# Patient Record
Sex: Male | Born: 1935 | Race: White | Hispanic: No | Smoking: Former smoker
Health system: Southern US, Community
[De-identification: ages and names within clinical notes are randomized; demographics above are authoritative.]

## PROBLEM LIST (undated history)

## (undated) DIAGNOSIS — I4891 Unspecified atrial fibrillation: Secondary | ICD-10-CM

## (undated) DIAGNOSIS — D500 Iron deficiency anemia secondary to blood loss (chronic): Secondary | ICD-10-CM

## (undated) DIAGNOSIS — E785 Hyperlipidemia, unspecified: Secondary | ICD-10-CM

## (undated) DIAGNOSIS — G4733 Obstructive sleep apnea (adult) (pediatric): Secondary | ICD-10-CM

## (undated) DIAGNOSIS — H269 Unspecified cataract: Secondary | ICD-10-CM

## (undated) DIAGNOSIS — K5790 Diverticulosis of intestine, part unspecified, without perforation or abscess without bleeding: Secondary | ICD-10-CM

## (undated) DIAGNOSIS — K909 Intestinal malabsorption, unspecified: Secondary | ICD-10-CM

## (undated) DIAGNOSIS — L0390 Cellulitis, unspecified: Secondary | ICD-10-CM

## (undated) DIAGNOSIS — I82409 Acute embolism and thrombosis of unspecified deep veins of unspecified lower extremity: Secondary | ICD-10-CM

## (undated) DIAGNOSIS — E1342 Other specified diabetes mellitus with diabetic polyneuropathy: Secondary | ICD-10-CM

## (undated) DIAGNOSIS — M1990 Unspecified osteoarthritis, unspecified site: Secondary | ICD-10-CM

## (undated) DIAGNOSIS — C3490 Malignant neoplasm of unspecified part of unspecified bronchus or lung: Secondary | ICD-10-CM

## (undated) DIAGNOSIS — H1010 Acute atopic conjunctivitis, unspecified eye: Secondary | ICD-10-CM

## (undated) DIAGNOSIS — I5031 Acute diastolic (congestive) heart failure: Secondary | ICD-10-CM

## (undated) DIAGNOSIS — J189 Pneumonia, unspecified organism: Secondary | ICD-10-CM

## (undated) DIAGNOSIS — J449 Chronic obstructive pulmonary disease, unspecified: Secondary | ICD-10-CM

## (undated) DIAGNOSIS — D509 Iron deficiency anemia, unspecified: Secondary | ICD-10-CM

## (undated) DIAGNOSIS — E1142 Type 2 diabetes mellitus with diabetic polyneuropathy: Secondary | ICD-10-CM

## (undated) DIAGNOSIS — I10 Essential (primary) hypertension: Secondary | ICD-10-CM

## (undated) DIAGNOSIS — H9190 Unspecified hearing loss, unspecified ear: Secondary | ICD-10-CM

## (undated) DIAGNOSIS — N400 Enlarged prostate without lower urinary tract symptoms: Secondary | ICD-10-CM

## (undated) HISTORY — DX: Essential (primary) hypertension: I10

## (undated) HISTORY — DX: Unspecified cataract: H26.9

## (undated) HISTORY — DX: Unspecified hearing loss, unspecified ear: H91.90

## (undated) HISTORY — DX: Benign prostatic hyperplasia without lower urinary tract symptoms: N40.0

## (undated) HISTORY — DX: Acute atopic conjunctivitis, unspecified eye: H10.10

## (undated) HISTORY — DX: Iron deficiency anemia, unspecified: D50.9

## (undated) HISTORY — DX: Type 2 diabetes mellitus with diabetic polyneuropathy: E11.42

## (undated) HISTORY — DX: Chronic obstructive pulmonary disease, unspecified: J44.9

## (undated) HISTORY — DX: Hyperlipidemia, unspecified: E78.5

## (undated) HISTORY — DX: Iron deficiency anemia secondary to blood loss (chronic): D50.0

## (undated) HISTORY — DX: Diverticulosis of intestine, part unspecified, without perforation or abscess without bleeding: K57.90

## (undated) HISTORY — DX: Cellulitis, unspecified: L03.90

## (undated) HISTORY — DX: Unspecified atrial fibrillation: I48.91

## (undated) HISTORY — DX: Unspecified osteoarthritis, unspecified site: M19.90

## (undated) HISTORY — DX: Intestinal malabsorption, unspecified: K90.9

## (undated) HISTORY — DX: Pneumonia, unspecified organism: J18.9

## (undated) HISTORY — PX: OTHER SURGICAL HISTORY: SHX169

---

## 1998-05-15 ENCOUNTER — Encounter: Payer: Self-pay | Admitting: Unknown Physician Specialty

## 1998-05-15 ENCOUNTER — Ambulatory Visit (HOSPITAL_COMMUNITY): Admission: RE | Admit: 1998-05-15 | Discharge: 1998-05-15 | Payer: Self-pay | Admitting: Unknown Physician Specialty

## 1999-03-20 ENCOUNTER — Ambulatory Visit: Admission: RE | Admit: 1999-03-20 | Discharge: 1999-03-20 | Payer: Self-pay | Admitting: Internal Medicine

## 2003-11-22 ENCOUNTER — Inpatient Hospital Stay (HOSPITAL_BASED_OUTPATIENT_CLINIC_OR_DEPARTMENT_OTHER): Admission: RE | Admit: 2003-11-22 | Discharge: 2003-11-22 | Payer: Self-pay | Admitting: Cardiology

## 2004-06-10 HISTORY — PX: CARDIAC CATHETERIZATION: SHX172

## 2010-12-08 ENCOUNTER — Encounter (HOSPITAL_COMMUNITY): Payer: Self-pay | Admitting: Radiology

## 2010-12-08 ENCOUNTER — Emergency Department (HOSPITAL_COMMUNITY): Payer: Medicare Other

## 2010-12-08 ENCOUNTER — Inpatient Hospital Stay (HOSPITAL_COMMUNITY)
Admission: EM | Admit: 2010-12-08 | Discharge: 2010-12-18 | DRG: 300 | Disposition: A | Discharge status: Home / Self Care | Payer: Medicare Other | Attending: Internal Medicine | Admitting: Internal Medicine

## 2010-12-08 DIAGNOSIS — C3490 Malignant neoplasm of unspecified part of unspecified bronchus or lung: Secondary | ICD-10-CM | POA: Diagnosis present

## 2010-12-08 DIAGNOSIS — IMO0002 Reserved for concepts with insufficient information to code with codable children: Secondary | ICD-10-CM | POA: Diagnosis present

## 2010-12-08 DIAGNOSIS — E785 Hyperlipidemia, unspecified: Secondary | ICD-10-CM | POA: Diagnosis present

## 2010-12-08 DIAGNOSIS — E669 Obesity, unspecified: Secondary | ICD-10-CM | POA: Diagnosis present

## 2010-12-08 DIAGNOSIS — N401 Enlarged prostate with lower urinary tract symptoms: Secondary | ICD-10-CM | POA: Diagnosis present

## 2010-12-08 DIAGNOSIS — I82629 Acute embolism and thrombosis of deep veins of unspecified upper extremity: Principal | ICD-10-CM | POA: Diagnosis present

## 2010-12-08 DIAGNOSIS — J449 Chronic obstructive pulmonary disease, unspecified: Secondary | ICD-10-CM | POA: Diagnosis present

## 2010-12-08 DIAGNOSIS — E1142 Type 2 diabetes mellitus with diabetic polyneuropathy: Secondary | ICD-10-CM | POA: Diagnosis present

## 2010-12-08 DIAGNOSIS — T85898A Other specified complication of other internal prosthetic devices, implants and grafts, initial encounter: Secondary | ICD-10-CM | POA: Diagnosis present

## 2010-12-08 DIAGNOSIS — J9819 Other pulmonary collapse: Secondary | ICD-10-CM | POA: Diagnosis not present

## 2010-12-08 DIAGNOSIS — E1149 Type 2 diabetes mellitus with other diabetic neurological complication: Secondary | ICD-10-CM | POA: Diagnosis present

## 2010-12-08 DIAGNOSIS — Z7982 Long term (current) use of aspirin: Secondary | ICD-10-CM

## 2010-12-08 DIAGNOSIS — Y849 Medical procedure, unspecified as the cause of abnormal reaction of the patient, or of later complication, without mention of misadventure at the time of the procedure: Secondary | ICD-10-CM | POA: Diagnosis present

## 2010-12-08 DIAGNOSIS — I10 Essential (primary) hypertension: Secondary | ICD-10-CM | POA: Diagnosis present

## 2010-12-08 DIAGNOSIS — J4489 Other specified chronic obstructive pulmonary disease: Secondary | ICD-10-CM | POA: Diagnosis present

## 2010-12-08 DIAGNOSIS — G4733 Obstructive sleep apnea (adult) (pediatric): Secondary | ICD-10-CM | POA: Diagnosis present

## 2010-12-08 DIAGNOSIS — R918 Other nonspecific abnormal finding of lung field: Secondary | ICD-10-CM

## 2010-12-08 DIAGNOSIS — I80299 Phlebitis and thrombophlebitis of other deep vessels of unspecified lower extremity: Secondary | ICD-10-CM

## 2010-12-08 DIAGNOSIS — M1710 Unilateral primary osteoarthritis, unspecified knee: Secondary | ICD-10-CM | POA: Diagnosis present

## 2010-12-08 DIAGNOSIS — N138 Other obstructive and reflux uropathy: Secondary | ICD-10-CM | POA: Diagnosis present

## 2010-12-08 LAB — MAGNESIUM: Magnesium: 2.1 mg/dL (ref 1.5–2.5)

## 2010-12-08 LAB — BASIC METABOLIC PANEL
BUN: 15 mg/dL (ref 6–23)
CO2: 28 mEq/L (ref 19–32)
Calcium: 9.1 mg/dL (ref 8.4–10.5)
Chloride: 99 mEq/L (ref 96–112)
Creatinine, Ser: 0.94 mg/dL (ref 0.50–1.35)
GFR calc Af Amer: >60 mL/min (ref >60)
GFR calc non Af Amer: >60 mL/min (ref >60)
Glucose, Bld: 124 mg/dL — ABNORMAL HIGH (ref 70–99)
Potassium: 4.4 mEq/L (ref 3.5–5.1)
Sodium: 137 mEq/L (ref 135–145)

## 2010-12-08 LAB — DIFFERENTIAL
Basophils Absolute: 0 10*3/uL (ref 0.0–0.1)
Basophils Relative: 0 % (ref 0–1)
Eosinophils Absolute: 0.1 10*3/uL (ref 0.0–0.7)
Eosinophils Relative: 1 % (ref 0–5)
Lymphocytes Relative: 29 % (ref 12–46)
Lymphs Abs: 3.2 10*3/uL (ref 0.7–4.0)
Monocytes Absolute: 0.8 10*3/uL (ref 0.1–1.0)
Monocytes Relative: 7 % (ref 3–12)
Neutro Abs: 6.9 10*3/uL (ref 1.7–7.7)
Neutrophils Relative %: 62 % (ref 43–77)

## 2010-12-08 LAB — CBC
HCT: 44.1 % (ref 39.0–52.0)
Hemoglobin: 15.2 g/dL (ref 13.0–17.0)
MCH: 29.7 pg (ref 26.0–34.0)
MCHC: 34.5 g/dL (ref 30.0–36.0)
MCV: 86.1 fL (ref 78.0–100.0)
Platelets: 237 10*3/uL (ref 150–400)
RBC: 5.12 MIL/uL (ref 4.22–5.81)
RDW: 13.5 % (ref 11.5–15.5)
WBC: 11 10*3/uL — ABNORMAL HIGH (ref 4.0–10.5)

## 2010-12-08 LAB — HEPATIC FUNCTION PANEL
Albumin: 2.9 g/dL — ABNORMAL LOW (ref 3.5–5.2)
Indirect Bilirubin: 0.1 mg/dL — ABNORMAL LOW (ref 0.3–0.9)
Total Bilirubin: 0.2 mg/dL — ABNORMAL LOW (ref 0.3–1.2)
Total Protein: 6.7 g/dL (ref 6.0–8.3)

## 2010-12-08 LAB — PROTIME-INR
INR: 0.96 (ref 0.00–1.49)
Prothrombin Time: 13 seconds (ref 11.6–15.2)

## 2010-12-08 LAB — HEPARIN LEVEL (UNFRACTIONATED): Heparin Unfractionated: 0.27 IU/mL — ABNORMAL LOW (ref 0.30–0.70)

## 2010-12-08 LAB — GLUCOSE, CAPILLARY: Glucose-Capillary: 197 mg/dL — ABNORMAL HIGH (ref 70–99)

## 2010-12-08 LAB — PHOSPHORUS: Phosphorus: 3.1 mg/dL (ref 2.3–4.6)

## 2010-12-08 LAB — APTT: aPTT: 35 seconds (ref 24–37)

## 2010-12-08 MED ORDER — IOHEXOL 300 MG/ML  SOLN: 75.0000 mL | Freq: Once | INTRAMUSCULAR | Status: AC | PRN

## 2010-12-09 DIAGNOSIS — R222 Localized swelling, mass and lump, trunk: Secondary | ICD-10-CM

## 2010-12-09 LAB — CBC
HCT: 41.6 % (ref 39.0–52.0)
MCH: 29.3 pg (ref 26.0–34.0)
MCV: 85.8 fL (ref 78.0–100.0)
Platelets: 210 10*3/uL (ref 150–400)
RBC: 4.85 MIL/uL (ref 4.22–5.81)
WBC: 7.7 10*3/uL (ref 4.0–10.5)

## 2010-12-09 LAB — DIFFERENTIAL
Eosinophils Absolute: 0.2 10*3/uL (ref 0.0–0.7)
Lymphocytes Relative: 34 % (ref 12–46)
Lymphs Abs: 2.6 10*3/uL (ref 0.7–4.0)
Monocytes Relative: 12 % (ref 3–12)
Neutrophils Relative %: 51 % (ref 43–77)

## 2010-12-09 LAB — URINALYSIS, ROUTINE W REFLEX MICROSCOPIC
Bilirubin Urine: NEGATIVE
Glucose, UA: NEGATIVE mg/dL
Hgb urine dipstick: NEGATIVE
Ketones, ur: NEGATIVE mg/dL
Leukocytes, UA: NEGATIVE
Nitrite: NEGATIVE
Protein, ur: NEGATIVE mg/dL
Specific Gravity, Urine: 1.008 (ref 1.005–1.030)
Urobilinogen, UA: 0.2 mg/dL (ref 0.0–1.0)
pH: 6.5 (ref 5.0–8.0)

## 2010-12-09 LAB — COMPREHENSIVE METABOLIC PANEL
ALT: 14 U/L (ref 0–53)
AST: 18 U/L (ref 0–37)
Alkaline Phosphatase: 91 U/L (ref 39–117)
CO2: 26 mEq/L (ref 19–32)
Calcium: 8.6 mg/dL (ref 8.4–10.5)
Chloride: 99 mEq/L (ref 96–112)
GFR calc Af Amer: >60 mL/min (ref >60)
GFR calc non Af Amer: >60 mL/min (ref >60)
Glucose, Bld: 126 mg/dL — ABNORMAL HIGH (ref 70–99)
Potassium: 4.1 mEq/L (ref 3.5–5.1)
Sodium: 134 mEq/L — ABNORMAL LOW (ref 135–145)
Total Bilirubin: 0.3 mg/dL (ref 0.3–1.2)

## 2010-12-09 LAB — SURGICAL PCR SCREEN
MRSA, PCR: NEGATIVE
Staphylococcus aureus: POSITIVE — AB

## 2010-12-09 LAB — HOMOCYSTEINE: Homocysteine: 8.6 umol/L (ref 4.0–15.4)

## 2010-12-09 LAB — GLUCOSE, CAPILLARY: Glucose-Capillary: 127 mg/dL — ABNORMAL HIGH (ref 70–99)

## 2010-12-09 LAB — ABO/RH: ABO/RH(D): A POS

## 2010-12-09 LAB — HEPARIN LEVEL (UNFRACTIONATED): Heparin Unfractionated: 0.55 IU/mL (ref 0.30–0.70)

## 2010-12-10 LAB — PROTEIN C ACTIVITY: Protein C Activity: 139 % — ABNORMAL HIGH (ref 75–133)

## 2010-12-10 LAB — COMPREHENSIVE METABOLIC PANEL
Albumin: 3 g/dL — ABNORMAL LOW (ref 3.5–5.2)
BUN: 12 mg/dL (ref 6–23)
Calcium: 8.7 mg/dL (ref 8.4–10.5)
Creatinine, Ser: 0.74 mg/dL (ref 0.50–1.35)
GFR calc Af Amer: >60 mL/min (ref >60)
Glucose, Bld: 146 mg/dL — ABNORMAL HIGH (ref 70–99)
Potassium: 4.9 mEq/L (ref 3.5–5.1)
Total Protein: 6.5 g/dL (ref 6.0–8.3)

## 2010-12-10 LAB — PROTIME-INR: Prothrombin Time: 12.9 seconds (ref 11.6–15.2)

## 2010-12-10 LAB — LUPUS ANTICOAGULANT PANEL: Lupus Anticoagulant: NOT DETECTED

## 2010-12-10 LAB — CBC
HCT: 43.2 % (ref 39.0–52.0)
MCH: 29.4 pg (ref 26.0–34.0)
MCHC: 34.3 g/dL (ref 30.0–36.0)
RDW: 13.6 % (ref 11.5–15.5)

## 2010-12-10 LAB — DIFFERENTIAL
Basophils Absolute: 0 10*3/uL (ref 0.0–0.1)
Eosinophils Relative: 4 % (ref 0–5)
Lymphocytes Relative: 35 % (ref 12–46)
Monocytes Absolute: 0.6 10*3/uL (ref 0.1–1.0)
Monocytes Relative: 9 % (ref 3–12)

## 2010-12-10 LAB — GLUCOSE, CAPILLARY
Glucose-Capillary: 162 mg/dL — ABNORMAL HIGH (ref 70–99)
Glucose-Capillary: 140 mg/dL — ABNORMAL HIGH (ref 70–99)

## 2010-12-10 LAB — PROTEIN S, TOTAL: Protein S Ag, Total: 94 % (ref 60–150)

## 2010-12-10 LAB — PROTEIN C, TOTAL: Protein C, Total: 90 % (ref 72–160)

## 2010-12-10 LAB — PROTEIN S ACTIVITY: Protein S Activity: 113 % (ref 69–129)

## 2010-12-11 ENCOUNTER — Other Ambulatory Visit: Payer: Self-pay | Admitting: Thoracic Surgery

## 2010-12-11 ENCOUNTER — Inpatient Hospital Stay (HOSPITAL_COMMUNITY): Payer: Medicare Other

## 2010-12-11 DIAGNOSIS — R222 Localized swelling, mass and lump, trunk: Secondary | ICD-10-CM

## 2010-12-11 HISTORY — PX: FIBEROPTIC BRONCHOSCOPY: SHX5367

## 2010-12-11 LAB — GLUCOSE, CAPILLARY
Glucose-Capillary: 268 mg/dL — ABNORMAL HIGH (ref 70–99)
Glucose-Capillary: 187 mg/dL — ABNORMAL HIGH (ref 70–99)

## 2010-12-11 LAB — CROSSMATCH
ABO/RH(D): A POS
Antibody Screen: NEGATIVE
Unit division: 0
Unit division: 0

## 2010-12-11 LAB — CBC
HCT: 41.3 % (ref 39.0–52.0)
Hemoglobin: 13.8 g/dL (ref 13.0–17.0)
MCHC: 33.4 g/dL (ref 30.0–36.0)
MCV: 86 fL (ref 78.0–100.0)
RDW: 13.5 % (ref 11.5–15.5)
WBC: 7.8 10*3/uL (ref 4.0–10.5)

## 2010-12-11 LAB — PROTIME-INR: INR: 0.93 (ref 0.00–1.49)

## 2010-12-11 LAB — COMPREHENSIVE METABOLIC PANEL
ALT: 20 U/L (ref 0–53)
AST: 27 U/L (ref 0–37)
CO2: 27 mEq/L (ref 19–32)
Chloride: 100 mEq/L (ref 96–112)
GFR calc Af Amer: >60 mL/min (ref >60)
GFR calc non Af Amer: >60 mL/min (ref >60)
Glucose, Bld: 143 mg/dL — ABNORMAL HIGH (ref 70–99)
Sodium: 135 mEq/L (ref 135–145)
Total Bilirubin: 0.3 mg/dL (ref 0.3–1.2)

## 2010-12-11 LAB — DIFFERENTIAL
Basophils Absolute: 0 10*3/uL (ref 0.0–0.1)
Eosinophils Relative: 2 % (ref 0–5)
Lymphocytes Relative: 34 % (ref 12–46)
Lymphs Abs: 2.6 10*3/uL (ref 0.7–4.0)
Monocytes Absolute: 0.7 10*3/uL (ref 0.1–1.0)
Neutro Abs: 4.2 10*3/uL (ref 1.7–7.7)

## 2010-12-11 MED ORDER — GADOBENATE DIMEGLUMINE 529 MG/ML IV SOLN: 20.0000 mL | Freq: Once | INTRAVENOUS | Status: DC

## 2010-12-12 ENCOUNTER — Inpatient Hospital Stay (HOSPITAL_COMMUNITY): Payer: Medicare Other

## 2010-12-12 DIAGNOSIS — C3490 Malignant neoplasm of unspecified part of unspecified bronchus or lung: Secondary | ICD-10-CM

## 2010-12-12 LAB — COMPREHENSIVE METABOLIC PANEL
AST: 41 U/L — ABNORMAL HIGH (ref 0–37)
BUN: 10 mg/dL (ref 6–23)
CO2: 25 mEq/L (ref 19–32)
Calcium: 9.2 mg/dL (ref 8.4–10.5)
Chloride: 98 mEq/L (ref 96–112)
Creatinine, Ser: 0.9 mg/dL (ref 0.50–1.35)
GFR calc Af Amer: >60 mL/min (ref >60)
GFR calc non Af Amer: >60 mL/min (ref >60)
Glucose, Bld: 180 mg/dL — ABNORMAL HIGH (ref 70–99)
Total Bilirubin: 0.4 mg/dL (ref 0.3–1.2)

## 2010-12-12 LAB — DIFFERENTIAL
Eosinophils Absolute: 0.1 10*3/uL (ref 0.0–0.7)
Eosinophils Relative: 1 % (ref 0–5)
Lymphocytes Relative: 24 % (ref 12–46)
Lymphs Abs: 2.4 10*3/uL (ref 0.7–4.0)
Monocytes Absolute: 0.7 10*3/uL (ref 0.1–1.0)

## 2010-12-12 LAB — GLUCOSE, CAPILLARY
Glucose-Capillary: 251 mg/dL — ABNORMAL HIGH (ref 70–99)
Glucose-Capillary: 229 mg/dL — ABNORMAL HIGH (ref 70–99)

## 2010-12-12 LAB — CBC
HCT: 42.8 % (ref 39.0–52.0)
MCH: 29 pg (ref 26.0–34.0)
MCHC: 33.9 g/dL (ref 30.0–36.0)
MCV: 85.6 fL (ref 78.0–100.0)
Platelets: 216 10*3/uL (ref 150–400)
RDW: 13.7 % (ref 11.5–15.5)

## 2010-12-12 LAB — CARDIAC PANEL(CRET KIN+CKTOT+MB+TROPI)
Relative Index: INVALID (ref 0.0–2.5)
Total CK: 66 U/L (ref 7–232)

## 2010-12-12 NOTE — H&P (Signed)
[name], [name] NO.:  [id number]  MEDICAL RECORD NO.:  [id number]  LOCATION:  [facility]                         FACILITY:  [facility]  PHYSICIAN:  Ojas Coone A. Linsi Humann, M.D.   DATE OF BIRTH:  01-12-1925  DATE OF ADMISSION:  12/08/2010 DATE OF DISCHARGE:                             HISTORY & PHYSICAL   CHIEF COMPLAINT:  Right arm swelling.  HISTORY OF PRESENT ILLNESS:  [name] is a pleasant 77 year old gentleman, who was in his usual state of health and in fact felt fine yesterday. He woke up this morning and noticed that he had extensive right upper extremity swelling.  We were called and he was advised to come to the emergency room where he is found to have extensive deep vein thrombosis to the level of the internal jugular on the right.  He denies any recent fevers.  He denies any recent travel or injury to his arm.  He has had no chest pains or shortness of breath that are new or different for him. He does have an occasional cough and has a history of COPD.  He has had no significant changes in his weight or dietary habits.  Given these findings, he will be admitted for further evaluation and treatment.  PAST MEDICAL HISTORY:  Allergic conjunctivitis, diabetic peripheral neuropathy, cataracts, benign prostatic hypertrophy with obstruction, degenerative joint disease of the left knee, chronic obstructive pulmonary disease, mild obesity, hyperlipidemia, type 2 diabetes, obstructive sleep apnea using a CPAP machine nightly, allergic rhinitis, hypertension, hyperlipidemia, hemorrhoids and diverticulosis by colonoscopy in 2006, and a right-sided pneumonia in September 2011.  He has had a cardiac cath in 2006 and a deviated septum repair in the past.  SOCIAL HISTORY:  He is married with two children.  He lives at home with his wife.  He works as a Visual merchandiser.  He had a prior smoking habit, but quit in 1994.  He still has an occasional cigarette.  He denies any alcohol or drug  use.  FAMILY HISTORY:  Father died at age 28 of MS.  Mother died at age 5. Family history is significant for cancer, multiple sclerosis, coronary disease, and diabetes.  REVIEW OF SYSTEMS:  As per the history of present illness.  PHYSICAL EXAM:  VITAL SIGNS:  Temperature 97.4, blood pressure 113/67, pulse 82, respiratory rate 18, 94% oxygen saturation on room air. GENERAL:  He is semi supine in no acute distress.  He has extensive edema of the right forearm and upper arm and even the right supraclavicular area.  He does have normal strength.  He has no redness or cellulitis changes noted. NEUROLOGIC:  He is neurologically grossly intact and moves extremities x4. NECK:  There is no appreciable JVD. LUNGS:  Clear to auscultation bilaterally with no wheezes, rales, or rhonchi. HEART:  Regular rate and rhythm with no significant murmur, rub, or gallop. ABDOMEN:  Soft, nontender, and nondistended.  There is no peripheral edema and grossly normal distal pulses.  LABORATORY DATA:  Several labs are pending, however, a CBC he is up that shows white count of 11,000 with 62% segs, 29% lymphocytes, 7% monocytes, hemoglobin is 15.2, platelet count 237,000, INR 0.96,  PTT is normal at 35.  Other labs are pending to include a BMET and LFT, mag phos, coagulopathy panel, and TSH.  ASSESSMENT/PLAN:  An 77 year old gentleman with new right upper extremity deep venous thrombosis, which appears to be spontaneous.  I am rather concerned about underlying malignancy.  We will place him on a full-dose heparin drip with bolus.  We will continue his aspirin and Zocor and Spiriva and albuterol.  We will put his diabetic medications on hold and cover him with Lantus and NovoLog insulin while in the hospital.  We will obtain a CT scan of the neck, chest, abdomen, and pelvis to look for underlying malignancy.  We will also do a coagulopathy panel.  He is a full code status.     [name] A. Waynard Edwards,  M.D.     MAP/MEDQ  D:  12/08/2010  T:  12/08/2010  Job:  [id number]  Electronically Signed by Rodrigo Ran M.D. on 12/12/2010 08:39:28 AM

## 2010-12-13 ENCOUNTER — Encounter (HOSPITAL_COMMUNITY): Payer: Self-pay | Admitting: Radiology

## 2010-12-13 ENCOUNTER — Ambulatory Visit (HOSPITAL_COMMUNITY): Payer: Medicare Other

## 2010-12-13 DIAGNOSIS — C3490 Malignant neoplasm of unspecified part of unspecified bronchus or lung: Secondary | ICD-10-CM | POA: Insufficient documentation

## 2010-12-13 DIAGNOSIS — J9819 Other pulmonary collapse: Secondary | ICD-10-CM | POA: Insufficient documentation

## 2010-12-13 LAB — CBC
MCH: 28.9 pg (ref 26.0–34.0)
MCHC: 33.7 g/dL (ref 30.0–36.0)
Platelets: 206 10*3/uL (ref 150–400)
RBC: 4.95 MIL/uL (ref 4.22–5.81)

## 2010-12-13 LAB — BETA-2-GLYCOPROTEIN I ABS, IGG/M/A
Beta-2 Glyco I IgG: 0 G Units
Beta-2-Glycoprotein I IgA: 3 A Units
Beta-2-Glycoprotein I IgM: 1 M Units

## 2010-12-13 LAB — PROTIME-INR
INR: 0.95 (ref 0.00–1.49)
Prothrombin Time: 12.9 seconds (ref 11.6–15.2)

## 2010-12-13 LAB — CARDIAC PANEL(CRET KIN+CKTOT+MB+TROPI)
CK, MB: 2.7 ng/mL (ref 0.3–4.0)
Relative Index: INVALID (ref 0.0–2.5)
Relative Index: INVALID (ref 0.0–2.5)
Total CK: 65 U/L (ref 7–232)
Total CK: 68 U/L (ref 7–232)
Troponin I: <0.3 ng/mL

## 2010-12-13 LAB — CARDIOLIPIN ANTIBODIES, IGG, IGM, IGA
Anticardiolipin IgA: 10 U/mL — ABNORMAL LOW
Anticardiolipin IgG: 18 GPL U/mL
Anticardiolipin IgM: 2 [MPL'U]/mL — ABNORMAL LOW

## 2010-12-13 LAB — BASIC METABOLIC PANEL
CO2: 28 mEq/L (ref 19–32)
Calcium: 8.9 mg/dL (ref 8.4–10.5)
Creatinine, Ser: 0.85 mg/dL (ref 0.50–1.35)
Glucose, Bld: 144 mg/dL — ABNORMAL HIGH (ref 70–99)

## 2010-12-13 LAB — GLUCOSE, CAPILLARY
Glucose-Capillary: 335 mg/dL — ABNORMAL HIGH (ref 70–99)
Glucose-Capillary: 102 mg/dL — ABNORMAL HIGH (ref 70–99)

## 2010-12-13 LAB — FACTOR 5 LEIDEN

## 2010-12-13 MED ORDER — FLUDEOXYGLUCOSE F - 18 (FDG) INJECTION
15.9000 | Freq: Once | INTRAVENOUS | Status: AC | PRN
  Administered 2010-12-13: 15.9 via INTRAVENOUS
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
## 2010-12-14 DIAGNOSIS — C3490 Malignant neoplasm of unspecified part of unspecified bronchus or lung: Secondary | ICD-10-CM

## 2010-12-14 LAB — GLUCOSE, CAPILLARY: Glucose-Capillary: 138 mg/dL — ABNORMAL HIGH (ref 70–99)

## 2010-12-14 LAB — CBC
HCT: 41.4 % (ref 39.0–52.0)
Hemoglobin: 14.1 g/dL (ref 13.0–17.0)
MCV: 86.3 fL (ref 78.0–100.0)
Platelets: 229 10*3/uL (ref 150–400)
RBC: 4.8 MIL/uL (ref 4.22–5.81)
WBC: 8.1 10*3/uL (ref 4.0–10.5)

## 2010-12-14 LAB — COMPREHENSIVE METABOLIC PANEL
ALT: 33 U/L (ref 0–53)
Alkaline Phosphatase: 98 U/L (ref 39–117)
Chloride: 99 mEq/L (ref 96–112)
GFR calc Af Amer: >60 mL/min (ref >60)
Glucose, Bld: 147 mg/dL — ABNORMAL HIGH (ref 70–99)
Potassium: 5 mEq/L (ref 3.5–5.1)
Sodium: 136 mEq/L (ref 135–145)
Total Protein: 7.1 g/dL (ref 6.0–8.3)

## 2010-12-14 LAB — DIFFERENTIAL
Eosinophils Absolute: 0.3 10*3/uL (ref 0.0–0.7)
Lymphocytes Relative: 26 % (ref 12–46)
Lymphs Abs: 2.1 10*3/uL (ref 0.7–4.0)
Neutro Abs: 4.7 10*3/uL (ref 1.7–7.7)
Neutrophils Relative %: 58 % (ref 43–77)

## 2010-12-14 LAB — PROTHROMBIN GENE MUTATION

## 2010-12-15 DIAGNOSIS — C3490 Malignant neoplasm of unspecified part of unspecified bronchus or lung: Secondary | ICD-10-CM

## 2010-12-15 LAB — COMPREHENSIVE METABOLIC PANEL
AST: 18 U/L (ref 0–37)
Albumin: 2.8 g/dL — ABNORMAL LOW (ref 3.5–5.2)
Alkaline Phosphatase: 95 U/L (ref 39–117)
Chloride: 100 mEq/L (ref 96–112)
Potassium: 4.1 mEq/L (ref 3.5–5.1)
Sodium: 137 mEq/L (ref 135–145)
Total Bilirubin: 0.4 mg/dL (ref 0.3–1.2)

## 2010-12-15 LAB — CULTURE, RESPIRATORY W GRAM STAIN

## 2010-12-15 LAB — DIFFERENTIAL
Basophils Absolute: 0 10*3/uL (ref 0.0–0.1)
Basophils Relative: 0 % (ref 0–1)
Monocytes Relative: 13 % — ABNORMAL HIGH (ref 3–12)
Neutro Abs: 4.2 10*3/uL (ref 1.7–7.7)
Neutrophils Relative %: 55 % (ref 43–77)

## 2010-12-15 LAB — GLUCOSE, CAPILLARY: Glucose-Capillary: 173 mg/dL — ABNORMAL HIGH (ref 70–99)

## 2010-12-15 LAB — CBC
Hemoglobin: 13.9 g/dL (ref 13.0–17.0)
RBC: 4.75 MIL/uL (ref 4.22–5.81)

## 2010-12-16 LAB — DIFFERENTIAL
Basophils Absolute: 0 10*3/uL (ref 0.0–0.1)
Lymphocytes Relative: 31 % (ref 12–46)
Monocytes Absolute: 0.9 10*3/uL (ref 0.1–1.0)
Monocytes Relative: 13 % — ABNORMAL HIGH (ref 3–12)
Neutro Abs: 3.7 10*3/uL (ref 1.7–7.7)

## 2010-12-16 LAB — COMPREHENSIVE METABOLIC PANEL
BUN: 17 mg/dL (ref 6–23)
Calcium: 9 mg/dL (ref 8.4–10.5)
Creatinine, Ser: 0.85 mg/dL (ref 0.50–1.35)
GFR calc Af Amer: >60 mL/min (ref >60)
GFR calc non Af Amer: >60 mL/min (ref >60)
Glucose, Bld: 196 mg/dL — ABNORMAL HIGH (ref 70–99)
Sodium: 133 mEq/L — ABNORMAL LOW (ref 135–145)
Total Protein: 7 g/dL (ref 6.0–8.3)

## 2010-12-16 LAB — CBC
HCT: 40.7 % (ref 39.0–52.0)
Hemoglobin: 14.4 g/dL (ref 13.0–17.0)
MCH: 30.1 pg (ref 26.0–34.0)
MCHC: 35.4 g/dL (ref 30.0–36.0)
MCV: 85 fL (ref 78.0–100.0)

## 2010-12-16 LAB — GLUCOSE, CAPILLARY
Glucose-Capillary: 170 mg/dL — ABNORMAL HIGH (ref 70–99)
Glucose-Capillary: 202 mg/dL — ABNORMAL HIGH (ref 70–99)
Glucose-Capillary: 144 mg/dL — ABNORMAL HIGH (ref 70–99)
Glucose-Capillary: 179 mg/dL — ABNORMAL HIGH (ref 70–99)

## 2010-12-17 ENCOUNTER — Inpatient Hospital Stay (HOSPITAL_COMMUNITY): Payer: Medicare Other

## 2010-12-17 DIAGNOSIS — C3490 Malignant neoplasm of unspecified part of unspecified bronchus or lung: Secondary | ICD-10-CM

## 2010-12-17 HISTORY — PX: OTHER SURGICAL HISTORY: SHX169

## 2010-12-17 LAB — DIFFERENTIAL
Basophils Absolute: 0 10*3/uL (ref 0.0–0.1)
Eosinophils Absolute: 0.3 10*3/uL (ref 0.0–0.7)
Eosinophils Relative: 3 % (ref 0–5)
Monocytes Absolute: 0.8 10*3/uL (ref 0.1–1.0)

## 2010-12-17 LAB — COMPREHENSIVE METABOLIC PANEL
AST: 16 U/L (ref 0–37)
Albumin: 3.3 g/dL — ABNORMAL LOW (ref 3.5–5.2)
Alkaline Phosphatase: 107 U/L (ref 39–117)
Chloride: 99 mEq/L (ref 96–112)
Creatinine, Ser: 0.87 mg/dL (ref 0.50–1.35)
Potassium: 4 mEq/L (ref 3.5–5.1)
Total Bilirubin: 0.4 mg/dL (ref 0.3–1.2)
Total Protein: 7.5 g/dL (ref 6.0–8.3)

## 2010-12-17 LAB — CBC
MCHC: 34.1 g/dL (ref 30.0–36.0)
Platelets: 251 10*3/uL (ref 150–400)
RDW: 13.6 % (ref 11.5–15.5)
WBC: 8.5 10*3/uL (ref 4.0–10.5)

## 2010-12-17 LAB — GLUCOSE, CAPILLARY
Glucose-Capillary: 179 mg/dL — ABNORMAL HIGH (ref 70–99)
Glucose-Capillary: 170 mg/dL — ABNORMAL HIGH (ref 70–99)
Glucose-Capillary: 173 mg/dL — ABNORMAL HIGH (ref 70–99)
Glucose-Capillary: 167 mg/dL — ABNORMAL HIGH (ref 70–99)

## 2010-12-17 NOTE — Op Note (Signed)
  NAMEEMET, RAFANAN NO.:  [id number]  MEDICAL RECORD NO.:  [id number]  LOCATION:  2036                         FACILITY:  [facility]  PHYSICIAN:  Ines Bloomer, M.D. DATE OF BIRTH:  1925-02-01  DATE OF PROCEDURE: DATE OF DISCHARGE:                              OPERATIVE REPORT   PREOPERATIVE DIAGNOSIS:  Stage IIIA non-small cell lung cancer.  POSTOPERATIVE DIAGNOSIS:  Stage IIIA non-small cell lung cancer.  OPERATION PERFORMED:  Insertion of a left subclavian Port-A-Cath.  SURGEON:  Ines Bloomer, MD  ANESTHESIA:  IV sedation and 1% Xylocaine.  After prepping and draping the left chest, area was infiltrated with 1% Xylocaine that entered clavicular area and a left subclavian puncture was performed.  The guidewire threaded under fluoro to the right atrium. Stab wound was made around the guidewire.  Another area was infiltrated with 1% Xylocaine,  inferior to this a transverse incision was made and a pocket was dissected out for the Port-A-Cath.  A 9.63 attached Bard Port-A-Cath was inserted in the pocket and sutured in place with 2-0 silk.  It was then the tubing was tunneled from the pocket to the stab wound around the guidewire.  The tube was then measured to be in the distal SVC and cut appropriately.  Over the guidewire was passed the dilator with peel-away sheath.  The dilator and the guidewire were removed and the tubing passed through the peel-away sheath and the peel- away sheath was removed.  This confirmed to be in the distal SVC by fluoro.  It flushed easily and withdrew easily.  Wounds were closed with 3-0 Vicryl in the subcutaneous tissue and Dermabond to the skin.  The Port-A-Cath was then cannulated with a Huber needle and connected to an IV.  The patient is turned to the recovery room in stable condition.     Ines Bloomer, M.D.     DPB/MEDQ  D:  12/17/2010  T:  12/17/2010  Job:  [id number]  cc:   Josph Macho,  M.D.  Electronically Signed by Jovita Gamma M.D. on 12/17/2010 01:56:03 PM

## 2010-12-17 NOTE — Op Note (Signed)
  [name], [name] NO.:  [id number]  MEDICAL RECORD NO.:  [id number]  LOCATION:  2036                         FACILITY:  [facility]  PHYSICIAN:  Ines Bloomer, M.D. DATE OF BIRTH:  [birth year]  DATE OF PROCEDURE: DATE OF DISCHARGE:                              OPERATIVE REPORT   PREOPERATIVE DIAGNOSIS:  Mediastinal adenopathy.  POSTOPERATIVE DIAGNOSIS:  Poorly-differentiated non-small colon cancer.  OPERATION PERFORMED:  Fiberoptic bronchoscopy with endobronchial ultrasound.  This patient came in with clot in his right subclavian vein that was new and probably his right IJ internal jugular with some swelling on the right arm, was started on heparin, was found to have a mediastinal mass into the right 4Rnode.  He was brought to the operating room for fiberoptic bronchoscopy with endobronchial ultrasound after general anesthesia.  Video bronchoscope was passed through the endotracheal tube.  The carina was is the midline.  The left mainstem, left upper lobe and left lower lobe orifices were normal.  Right mainstem, right upper lobe and right lower lobe orifices were normal.  No endobronchial lesions could be seen.  Washings were sent for cytologies.  We then inserted the endobronchial ultrasound, saw several large 4R nodes.  We then had aspirations of these by passing the needle through the working channel so we can see the sheath and then under ultrasound guidance biopsying 2 sets of 4R nodes with 3 aspirations in 1 and 2 aspirations in the other.  They were sent for cytology and cytology showed a probable poor differentiated non-small cell lung cancer.  The video bronchoscope was viewed.  All scope was removed.  The patient turned to the recovery room in stable condition.     Ines Bloomer, M.D.     DPB/MEDQ  D:  12/11/2010  T:  12/12/2010  Job:  [id number]  Electronically Signed by Jovita Gamma M.D. on 12/17/2010 01:56:01 PM

## 2010-12-18 ENCOUNTER — Inpatient Hospital Stay (HOSPITAL_COMMUNITY): Payer: Medicare Other

## 2010-12-18 LAB — DIFFERENTIAL
Eosinophils Absolute: 0.4 10*3/uL (ref 0.0–0.7)
Eosinophils Relative: 6 % — ABNORMAL HIGH (ref 0–5)
Lymphocytes Relative: 29 % (ref 12–46)
Lymphs Abs: 2 10*3/uL (ref 0.7–4.0)
Monocytes Absolute: 0.9 10*3/uL (ref 0.1–1.0)
Monocytes Relative: 13 % — ABNORMAL HIGH (ref 3–12)

## 2010-12-18 LAB — GLUCOSE, CAPILLARY
Glucose-Capillary: 179 mg/dL — ABNORMAL HIGH (ref 70–99)
Glucose-Capillary: 224 mg/dL — ABNORMAL HIGH (ref 70–99)

## 2010-12-18 LAB — CBC
HCT: 40.5 % (ref 39.0–52.0)
MCH: 29.4 pg (ref 26.0–34.0)
MCHC: 33.8 g/dL (ref 30.0–36.0)
MCV: 86.9 fL (ref 78.0–100.0)
Platelets: 247 10*3/uL (ref 150–400)
RDW: 13.6 % (ref 11.5–15.5)
WBC: 7 10*3/uL (ref 4.0–10.5)

## 2010-12-18 LAB — COMPREHENSIVE METABOLIC PANEL
AST: 15 U/L (ref 0–37)
Albumin: 2.8 g/dL — ABNORMAL LOW (ref 3.5–5.2)
BUN: 19 mg/dL (ref 6–23)
Calcium: 8.7 mg/dL (ref 8.4–10.5)
Chloride: 97 mEq/L (ref 96–112)
Creatinine, Ser: 0.88 mg/dL (ref 0.50–1.35)
Total Bilirubin: 0.3 mg/dL (ref 0.3–1.2)
Total Protein: 6.6 g/dL (ref 6.0–8.3)

## 2010-12-19 ENCOUNTER — Emergency Department (HOSPITAL_COMMUNITY)
Admission: EM | Admit: 2010-12-19 | Discharge: 2010-12-19 | Disposition: A | Discharge status: Home / Self Care | Payer: Medicare Other | Attending: Emergency Medicine | Admitting: Emergency Medicine

## 2010-12-19 ENCOUNTER — Ambulatory Visit
Admit: 2010-12-19 | Discharge: 2010-12-19 | Disposition: A | Discharge status: Home / Self Care | Payer: Medicare Other | Attending: Radiation Oncology | Admitting: Radiation Oncology

## 2010-12-19 ENCOUNTER — Emergency Department (HOSPITAL_COMMUNITY): Payer: Medicare Other

## 2010-12-19 DIAGNOSIS — J449 Chronic obstructive pulmonary disease, unspecified: Secondary | ICD-10-CM | POA: Insufficient documentation

## 2010-12-19 DIAGNOSIS — C771 Secondary and unspecified malignant neoplasm of intrathoracic lymph nodes: Secondary | ICD-10-CM | POA: Insufficient documentation

## 2010-12-19 DIAGNOSIS — T82898A Other specified complication of vascular prosthetic devices, implants and grafts, initial encounter: Secondary | ICD-10-CM | POA: Insufficient documentation

## 2010-12-19 DIAGNOSIS — E1149 Type 2 diabetes mellitus with other diabetic neurological complication: Secondary | ICD-10-CM | POA: Insufficient documentation

## 2010-12-19 DIAGNOSIS — I10 Essential (primary) hypertension: Secondary | ICD-10-CM | POA: Insufficient documentation

## 2010-12-19 DIAGNOSIS — I4891 Unspecified atrial fibrillation: Secondary | ICD-10-CM | POA: Insufficient documentation

## 2010-12-19 DIAGNOSIS — Y842 Radiological procedure and radiotherapy as the cause of abnormal reaction of the patient, or of later complication, without mention of misadventure at the time of the procedure: Secondary | ICD-10-CM | POA: Insufficient documentation

## 2010-12-19 DIAGNOSIS — Z87891 Personal history of nicotine dependence: Secondary | ICD-10-CM | POA: Insufficient documentation

## 2010-12-19 DIAGNOSIS — C3490 Malignant neoplasm of unspecified part of unspecified bronchus or lung: Secondary | ICD-10-CM | POA: Insufficient documentation

## 2010-12-19 DIAGNOSIS — J4489 Other specified chronic obstructive pulmonary disease: Secondary | ICD-10-CM | POA: Insufficient documentation

## 2010-12-19 DIAGNOSIS — G4730 Sleep apnea, unspecified: Secondary | ICD-10-CM | POA: Insufficient documentation

## 2010-12-19 DIAGNOSIS — Z7982 Long term (current) use of aspirin: Secondary | ICD-10-CM | POA: Insufficient documentation

## 2010-12-19 DIAGNOSIS — Z86718 Personal history of other venous thrombosis and embolism: Secondary | ICD-10-CM | POA: Insufficient documentation

## 2010-12-19 DIAGNOSIS — Y849 Medical procedure, unspecified as the cause of abnormal reaction of the patient, or of later complication, without mention of misadventure at the time of the procedure: Secondary | ICD-10-CM | POA: Insufficient documentation

## 2010-12-19 DIAGNOSIS — Z79899 Other long term (current) drug therapy: Secondary | ICD-10-CM | POA: Insufficient documentation

## 2010-12-19 DIAGNOSIS — J40 Bronchitis, not specified as acute or chronic: Secondary | ICD-10-CM | POA: Insufficient documentation

## 2010-12-19 DIAGNOSIS — E785 Hyperlipidemia, unspecified: Secondary | ICD-10-CM | POA: Insufficient documentation

## 2010-12-19 DIAGNOSIS — E1142 Type 2 diabetes mellitus with diabetic polyneuropathy: Secondary | ICD-10-CM | POA: Insufficient documentation

## 2010-12-19 DIAGNOSIS — E119 Type 2 diabetes mellitus without complications: Secondary | ICD-10-CM | POA: Insufficient documentation

## 2010-12-19 DIAGNOSIS — R05 Cough: Secondary | ICD-10-CM | POA: Insufficient documentation

## 2010-12-19 DIAGNOSIS — R21 Rash and other nonspecific skin eruption: Secondary | ICD-10-CM | POA: Insufficient documentation

## 2010-12-19 DIAGNOSIS — R059 Cough, unspecified: Secondary | ICD-10-CM | POA: Insufficient documentation

## 2010-12-19 DIAGNOSIS — IMO0002 Reserved for concepts with insufficient information to code with codable children: Secondary | ICD-10-CM | POA: Insufficient documentation

## 2010-12-19 DIAGNOSIS — R1310 Dysphagia, unspecified: Secondary | ICD-10-CM | POA: Insufficient documentation

## 2010-12-19 DIAGNOSIS — R110 Nausea: Secondary | ICD-10-CM | POA: Insufficient documentation

## 2010-12-19 DIAGNOSIS — Z510 Encounter for antineoplastic radiation therapy: Secondary | ICD-10-CM | POA: Insufficient documentation

## 2010-12-20 NOTE — Consult Note (Signed)
[name], [name] NO.:  [id number]  MEDICAL RECORD NO.:  [id number]  LOCATION:  2036                         FACILITY:  [facility]  PHYSICIAN:  Kerin Perna, M.D.  DATE OF BIRTH:  06-11-1924  DATE OF CONSULTATION: DATE OF DISCHARGE:                                CONSULTATION   PHYSICIAN REQUESTING CONSULTATION:  Mark A. Perini, M.D.  PRIMARY CARE PHYSICIAN:  Dr. Vassie Loll.  REASON FOR CONSULTATION:  Right paratracheal mass with DVT of the right upper extremity.  CHIEF COMPLAINT:  Right arm swelling.  HISTORY OF PRESENT ILLNESS:  I was asked to evaluate this 77 year old Caucasian male smoker for further evaluation treatment of recently diagnosed 3-cm right paratracheal mass.  The patient presented to the hospital yesterday with pain and swelling in his right upper extremity and was found to have extensive DVT of the right subclavian vein and right internal jugular vein to the brachial cephalic vein on the right. He denies any trauma to the neck or right upper extremity.  He has a chronic smoker.  He has lost 20 pounds over the last 6 months.  He denies productive cough, hemoptysis, or fever.  A CT scan of the chest and abdomen was performed to rule out occult malignancy, which showed a 3-cm right paratracheal mass.  There was some thrombus noted in the central venous system.  There is no primary lung mass.  His abdominal CT showed no evidence of abdominal metastatic disease.  He did have a gallstone and some atherosclerotic changes of his iliac artery.  PAST MEDICAL HISTORY: 1. Diabetes mellitus. 2. Hypertension. 3. COPD with obstructive sleep apnea. 4. Degenerative arthritis. 5. Dyslipidemia. 6. Diverticulosis. 7. BPH. 8. Obesity. 9. Peripheral neuropathy.  ALLERGIES:  NO KNOWN DRUG ALLERGIES.  HOME MEDICATIONS:  Insulin, Zocor, Spiriva, Ventolin, aspirin.  SOCIAL HISTORY:  He is married with children and lives alone with his wife.  His wife has  cancer is being treated by Dr. Tera Partridge.  He worked as a Visual merchandiser.  He smokes occasionally after smoking heavily until 1994.  FAMILY HISTORY:  Positive for multiple sclerosis, cancer, coronary disease, and diabetes.  REVIEW OF SYSTEMS:  CONSTITUTIONAL:  Review is positive for weight loss. Negative for fever or night sweats.  ENT:  Review is positive for history of rhinitis and nasal septal surgery.  No active dental complaints.  No difficulty swallowing.  THORACIC:  Review is negative history of thoracic trauma or previously abnormal chest x-ray.  CARDIAC: Review is positive for a normal cardiac cath in 2006, no symptoms of angina.  No history of murmur.  GI:  Review is positive for diverticular disease.  Negative for hepatitis or jaundice.  ENDOCRINE:  Review is positive diabetes.  His hemoglobin A1c is 7.1.  VASCULAR: Review is negative for previous DVT claudication or TIA.  NEUROLOGIC:  Review is negative for stroke or seizure.  He is right-hand dominant.  PHYSICAL EXAMINATION:  VITAL SIGNS:  The patient is 5 feet 11, weighs 220 pounds.  Blood pressure 115/70, pulse 80 with PACs saturation 94% on room air. GENERAL APPEARANCE:  An elderly obese Caucasian male no acute distress. HEENT:  Exam is  normocephalic.  Pupils are equal. NECK:  Without palpable mass or bruit. LYMPHATICS: Show no palpable cervical or supraclavicular adenopathy. RESPIRATORY:  Breath sounds are distant but clear. CARDIAC:  Exam is with a regular rhythm but with PACs.  No murmur or S3 gallop. EXTREMITIES:  Reveal swelling of the right upper extremity compared to the left with some tenderness at the shoulder and neck. ABDOMINAL:  Soft, nontender without pulsatile mass. LOWER EXTREMITIES:  No edema, tenderness.  Peripheral pulses are intact 1-2+. NEUROLOGIC:  Exam is nonfocal.  LABORATORY DATA:  His hemoglobin is 15, platelet count 200,000, INR 0.96.  LFTs normal.  CAT scan of the chest as noted above.  EKG  sinus rhythm.  PLAN:  The patient will be prepared for bronchoscopy and mediastinoscopy to biopsy the paratracheal mass, which is highly suspicious for lung cancer.  I discussed the procedure in detail with the patient and family including the risks of bleeding.  We will stop the IV heparin approximately 6 hours before his surgery.     Kerin Perna, M.D.     PV/MEDQ  D:  12/09/2010  T:  12/09/2010  Job:  [id number]  cc:   Oretha Milch, MD  Electronically Signed by Kerin Perna M.D. on 12/20/2010 02:49:27 PM

## 2010-12-24 ENCOUNTER — Ambulatory Visit (INDEPENDENT_AMBULATORY_CARE_PROVIDER_SITE_OTHER): Payer: Self-pay | Admitting: Thoracic Surgery

## 2010-12-24 ENCOUNTER — Ambulatory Visit: Payer: Medicare Other | Admitting: Thoracic Surgery

## 2010-12-24 DIAGNOSIS — C3490 Malignant neoplasm of unspecified part of unspecified bronchus or lung: Secondary | ICD-10-CM

## 2010-12-25 ENCOUNTER — Ambulatory Visit: Payer: Medicare Other | Admitting: Thoracic Surgery

## 2010-12-25 ENCOUNTER — Other Ambulatory Visit: Payer: Self-pay | Admitting: Hematology & Oncology

## 2010-12-25 ENCOUNTER — Ambulatory Visit (HOSPITAL_BASED_OUTPATIENT_CLINIC_OR_DEPARTMENT_OTHER): Payer: Medicare Other | Admitting: Hematology & Oncology

## 2010-12-25 DIAGNOSIS — I82B19 Acute embolism and thrombosis of unspecified subclavian vein: Secondary | ICD-10-CM

## 2010-12-25 DIAGNOSIS — Z5111 Encounter for antineoplastic chemotherapy: Secondary | ICD-10-CM

## 2010-12-25 LAB — CMP (CANCER CENTER ONLY)
Albumin: 3 g/dL — ABNORMAL LOW (ref 3.3–5.5)
CO2: 27 mEq/L (ref 18–33)
Glucose, Bld: 79 mg/dL (ref 73–118)
Sodium: 130 mEq/L (ref 128–145)
Total Bilirubin: 0.5 mg/dl (ref 0.20–1.60)
Total Protein: 7.4 g/dL (ref 6.4–8.1)

## 2010-12-25 LAB — CBC WITH DIFFERENTIAL (CANCER CENTER ONLY)
BASO%: 0.2 % (ref 0.0–2.0)
LYMPH%: 24.3 % (ref 14.0–48.0)
MCV: 84 fL (ref 82–98)
MONO#: 0.9 10*3/uL (ref 0.1–0.9)
MONO%: 8.4 % (ref 0.0–13.0)
NEUT#: 6.6 10*3/uL — ABNORMAL HIGH (ref 1.5–6.5)
Platelets: 280 10*3/uL (ref 145–400)
RBC: 4.93 10*6/uL (ref 4.20–5.70)
RDW: 13.5 % (ref 11.1–15.7)
WBC: 10.2 10*3/uL — ABNORMAL HIGH (ref 4.0–10.0)

## 2010-12-25 NOTE — Assessment & Plan Note (Signed)
OFFICE VISIT  Patrick, Schmidt DOB:  1925/01/08                                        December 24, 2010 CHART #:  [id number]  [name] returns today and he is doing well overall.  He has no more problems with bleeding.  He still has a marked hematoma on his left chest, but it appears to be stable, however with infection.  He has also gotten collaterals for his subclavian DVT.  We are putting him back on Lovenox 100 mg daily and told him to take this daily and not twice a day, and we will see him back again in 1 week.  Ines Bloomer, M.D. Electronically Signed  DPB/MEDQ  D:  12/24/2010  T:  12/25/2010  Job:  [id number]

## 2010-12-26 LAB — HEPARIN ANTI-XA: Heparin LMW: <0.1 IU/mL

## 2011-01-01 ENCOUNTER — Ambulatory Visit (INDEPENDENT_AMBULATORY_CARE_PROVIDER_SITE_OTHER): Payer: Medicare Other | Admitting: Thoracic Surgery

## 2011-01-01 DIAGNOSIS — C3490 Malignant neoplasm of unspecified part of unspecified bronchus or lung: Secondary | ICD-10-CM

## 2011-01-02 NOTE — Assessment & Plan Note (Signed)
OFFICE VISIT  DAVIS, AMBROSINI DOB:  1924/07/10                                        January 01, 2011 CHART #:  [id number]  The patient returns today.  He has received his chemo by vein and his Port-A-Cath site still shows a large hematoma but this is slowly improving.  There is no evidence of infection.  I will leave the stitches and see him back in a week and get some of the stitches out.  Ines Bloomer, M.D. Electronically Signed  DPB/MEDQ  D:  01/01/2011  T:  01/02/2011  Job:  [id number]  cc:   Josph Macho, M.D.

## 2011-01-07 LAB — FUNGUS CULTURE W SMEAR: Fungal Smear: NONE SEEN

## 2011-01-07 NOTE — Discharge Summary (Signed)
[name], [name] NO.:  [id number]  MEDICAL RECORD NO.:  [id number]  LOCATION:  2036                         FACILITY:  [facility]  PHYSICIAN:  Larina Earthly, M.D.        DATE OF BIRTH:  12/08/1924  DATE OF ADMISSION:  12/08/2010 DATE OF DISCHARGE:  12/17/2010                        DISCHARGE SUMMARY - REFERRING   DISCHARGE DIAGNOSES: 1. Non-small cell lung cancer, stage IIIB so far based on preliminary     results, status post Port-A-Cath placement for presumed     chemotherapy to be started on an outpatient basis. 2. Right upper extremity deep vein thrombosis, to be treated with     subcu Lovenox, complicated by underlying malignancy. 3. Type 2 diabetes. 4. Chronic obstructive pulmonary disease, stable. 5. Left upper extremity cellulitis, complicated by IV infusion,     resolving.  SECONDARY DIAGNOSES: 1. History of allergic conjunctivitis. 2. Diabetic peripheral neuropathy. 3. Cataracts. 4. Benign prostatic hypertrophy with obstruction. 5. Degenerative joint disease of left knee. 6. Mild obesity. 7. Hyperlipidemia. 8. Obstructive sleep apnea using CPAP machine nightly. 9. Hypertension. 10.Hyperlipidemia. 11.Hemorrhoids and diverticulosis by colonoscopy in 2006. 12.History of right-sided pneumonia in September 2011. 13.History of deviated septum repair in the past. 14.History of cardiac catheterization in 2006.  DISCHARGE MEDICATIONS: 1. Albuterol inhaler 2 puffs every 4 hours as needed. 2. Cephalexin or Keflex 500 mg t.i.d. for 5 days. 3. Lovenox 150 mg subcu q.24 h. 4. Folic acid 1 mg daily. 5. Vicodin one every 6 hours as needed. 6. MiraLax 17 g daily as needed for constipation. 7. Spiriva 18 mcg inhaled once daily. 8. Ambien 5 mg daily as needed for sleep. 9. Aspirin 81 mg daily. 10.Byetta 10 mcg twice daily prior to meals. 11.Chondroitin sulfate and glucosamine supplementations 2 capsules     daily for arthritis. 12.Metformin 1000 mg twice  daily with food. 13.Simvastatin 40 mg every morning. 14.Zyrtec 10 mg every day as needed for allergies.  DISCHARGE LABORATORY EVALUATION:  On December 17, 2010; white blood cell count 8.5, hemoglobin 15.5, hematocrit 45.5%, platelet count 251. Sodium 136, potassium 4.0, serum CO2 of 29, BUN 20, creatinine 0.87, glucose 172.  Liver function tests are normal.  Albumin 3.3, calcium 9.3.  Chest x-ray; status post left Port-A-Cath placement on December 17, 2010 by Dr. Edwyna Shell revealed no evidence of pneumothorax or acute cardiopulmonary disease, but significant for ongoing evidence of malignancy.  CONSULTATIONS:  Dr. Arlan Organ on December 12, 2010 and Dr. Kathlee Nations Trigt on December 09, 2010.  RADIOLOGY STUDIES:  CT of the chest, abdomen, pelvis, and neck on June 30 revealed right paratracheal nodal mass with likely obstruction of the right brachiocephalic vein.  Associated thrombus is possible given the edematous changes in the right supraclavicular fossa and right axilla with coronary artery calcification, but no evidence of metastatic disease in the abdomen and pelvis.  Cholecystitis is present.  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  Mild compression of L2 superior endplate, age indeterminate.  CT of the neck reveals mass in right paratracheal region, suspicious for malignancy and measuring up to 3.8 cm compressing the right innominate vein, lack of  contrast within the right internal jugular vein and right subclavian vein suggestive of seizure either thrombosed or at risk for occlusion surrounding inflammation present. MRI of the brain on December 12, 2010 revealed no evidence of metastatic disease or other acute lesions, chronic small vessel change throughout the brain.  PET scan on July 5 revealed right suprahilar mass invading the mediastinum consistent with primary bronchogenic carcinoma, suspicion for ipsilateral and contralateral lower paratracheal  nodal metastasis and focal consolidation and/or atelectasis in the left lower lobe of the lung consistent with benign process, but no distant metastasis.  Chest x-ray status post Port-A-Cath placement on December 17, 2010 revealed no evidence of pneumothorax.  HISTORY OF PRESENT ILLNESS:  Please see history and physical dictated by Dr. Rodrigo Ran for extensive details.  However, this is an 77 year old gentleman with the above-mentioned medical problems who was in his usual state of health up until 24 hours prior to admission when he noticed increasing right upper extremity swelling.  He was advised to go to the emergency room where he was found to have extensive deep vein thrombosis at the level of the internal jugular of the right, denied any fevers, denied any trauma, and was subsequently admitted for further evaluation and treatment.  He was initiated on IV heparin and/or significant concern for underlying malignancy.  HOSPITAL COURSE:  The patient was admitted, found to have evidence of an underlying malignancy with paratracheal node as well as hilar mass with details mentioned above in the radiology section.  The patient remained anticoagulated and with consultations by both Cardiothoracic Surgery as well as Oncology were obtained with high suspicion of bronchogenic carcinoma.  The patient did undergo video-assisted thoracic biopsy with regulation of possible adenocarcinoma based on evaluation by Dr. Arlan Organ.  He was thought to be probable stage IIIA and was not thought to be an operative candidate and was questionable based on their evaluation whether the patient would tolerate both chemotherapy and radiation therapy.  After extensive discussion, Port-A-Cath was placed for possible chemotherapy.  Given the timing with holidays and weekends, this was not performed until December 17, 2010.  Chemotherapy to be determined on an outpatient basis by Dr. Myna Hidalgo.  1. With respect to the  right upper extremity DVT, stent placement was     considered by both Dr. Myna Hidalgo and Dr. Edwyna Shell, however, this was     not thought possible after discussion with Radiology.  He was     initially anticoagulated with heparin and switched over to Arixtra     to be dosed daily and per recommendations of Dr. Myna Hidalgo, he was to     be discharged on this on an outpatient basis.  However, this became     cause prohibitive and after discussion with social work who     determined that his outpatient cause would be in excess of 1400     dollars a month, they called Dr. Gustavo Lah office who recommended     switching the patient to subcu Lovenox, pharmacy dosed this at 150     mg each day, prescription was written, and the patient was thought     appropriate for discharge on December 17, 2010 from Oncology's     perspective.  Given the placement of a Port-A-Cath, the patient did have a possible complication the afternoon after placement of the Port-A-Cath with increasing saturation of the dressing with blood or at least serosanguineous fluid.  Cardiothoracic Surgery was called, and they did suggest replacing the dressing  and monitoring, but did not hold up to discharge as if this point. 1. With respect to the patient's type 2 diabetes, he was maintained on     Lantus and sliding scale insulin and remained fairly well     controlled with most blood sugars remaining less than 200, and he     will be discharged home on his home regimen minus sulfonylurea with     close monitoring on an outpatient basis. 2. COPD remained quite stable, not requiring excessive amounts of     oxygen indeed his oxygen saturation remained normal on room air.     Throughout his hospitalization, he did continue albuterol inhaler     p.r.n. as well as his daily dose of Spiriva. 3. The patient did develop a focal area of cellulitis with respect to     left upper extremity IV infusion.  This quickly defervesced or     quickly  improved with the administration of Keflex such that he was     almost asymptomatic without any visual erythema at the day of     discharge.  Please note that at the time of discharge, the patient was to be contacted by Dr. Arlan Organ for further outpatient management of his malignancy, and he was also to call our office for follow-up office visit in 2-3 weeks for further evaluation of his type 2 diabetes and other non-malignancy associated medical problems.     Larina Earthly, M.D.     RA/MEDQ  D:  12/17/2010  T:  12/17/2010  Job:  [id number]  cc:   Josph Macho, M.D. Ines Bloomer, M.D.  Electronically Signed by Larina Earthly M.D. on 01/07/2011 07:02:59 PM

## 2011-01-07 NOTE — Discharge Summary (Signed)
  [name], OVERBY NO.:  [id number]  MEDICAL RECORD NO.:  [id number]  LOCATION:  2036                         FACILITY:  [facility]  PHYSICIAN:  Larina Earthly, M.D.        DATE OF BIRTH:  1924-11-30  DATE OF ADMISSION:  12/08/2010 DATE OF DISCHARGE:  12/18/2010                        DISCHARGE SUMMARY - REFERRING   Please see discharge summary dictated yesterday for extensive details. However, please note that on discharge medications, subcu Lovenox was changed from 150 mg every 24 hours to 100 mg twice daily by Dr. Arlan Organ.  Appropriate medications will be __________ to the patient's pharmacy.  First dose will be given prior to discharge.  Other complicating issues include hematoma development around Port-A-Cath that was placed on December 17, 2010.  This was evaluated by cardiothoracic surgery on the evening of December 17, 2010, again.  On the morning of __________ hematoma acknowledged and present.  The patient thought appropriate for discharge with close followup on an outpatient basis by Dr. Edwyna Shell.     Larina Earthly, M.D.     RA/MEDQ  D:  12/18/2010  T:  12/18/2010  Job:  [id number]  cc:   Ines Bloomer, M.D. Josph Macho, M.D.  Electronically Signed by Larina Earthly M.D. on 01/07/2011 07:03:07 PM

## 2011-01-09 ENCOUNTER — Ambulatory Visit (INDEPENDENT_AMBULATORY_CARE_PROVIDER_SITE_OTHER): Payer: Medicare Other | Admitting: Thoracic Surgery

## 2011-01-09 DIAGNOSIS — C3490 Malignant neoplasm of unspecified part of unspecified bronchus or lung: Secondary | ICD-10-CM

## 2011-01-09 NOTE — Assessment & Plan Note (Signed)
OFFICE VISIT  [name], [name] DOB:  [birth year]                                        January 09, 2011 CHART #:  [id number]  The patient came today, and we removed the stitches out of his Port-A- Cath.  The hematomas continues to reabsorb.  The Port-A-Cath site appears to be healing and can now be used for chemotherapy.  I will see him back again in 2 weeks to check the Port-A-Cath site for final check.  Ines Bloomer, M.D. Electronically Signed  DPB/MEDQ  D:  01/09/2011  T:  01/09/2011  Job:  [id number]  cc:   Josph Macho, M.D.

## 2011-01-15 ENCOUNTER — Encounter (HOSPITAL_BASED_OUTPATIENT_CLINIC_OR_DEPARTMENT_OTHER): Payer: Medicare Other | Admitting: Hematology & Oncology

## 2011-01-15 ENCOUNTER — Other Ambulatory Visit: Payer: Self-pay | Admitting: Hematology & Oncology

## 2011-01-15 DIAGNOSIS — Z5111 Encounter for antineoplastic chemotherapy: Secondary | ICD-10-CM

## 2011-01-15 DIAGNOSIS — I82B19 Acute embolism and thrombosis of unspecified subclavian vein: Secondary | ICD-10-CM

## 2011-01-15 DIAGNOSIS — C3410 Malignant neoplasm of upper lobe, unspecified bronchus or lung: Secondary | ICD-10-CM

## 2011-01-15 LAB — COMPREHENSIVE METABOLIC PANEL
ALT: 65 U/L — ABNORMAL HIGH (ref 0–53)
CO2: 25 mEq/L (ref 19–32)
Calcium: 9.5 mg/dL (ref 8.4–10.5)
Chloride: 100 mEq/L (ref 96–112)
Potassium: 4.3 mEq/L (ref 3.5–5.3)
Sodium: 138 mEq/L (ref 135–145)
Total Protein: 6.8 g/dL (ref 6.0–8.3)

## 2011-01-15 LAB — CBC WITH DIFFERENTIAL (CANCER CENTER ONLY)
BASO%: 0.4 % (ref 0.0–2.0)
HCT: 41.5 % (ref 38.7–49.9)
LYMPH#: 1.4 10*3/uL (ref 0.9–3.3)
MONO#: 0.7 10*3/uL (ref 0.1–0.9)
Platelets: 289 10*3/uL (ref 145–400)
RDW: 14.9 % (ref 11.1–15.7)
WBC: 4.6 10*3/uL (ref 4.0–10.0)

## 2011-01-15 LAB — LACTATE DEHYDROGENASE: LDH: 160 U/L (ref 94–250)

## 2011-01-23 ENCOUNTER — Ambulatory Visit (INDEPENDENT_AMBULATORY_CARE_PROVIDER_SITE_OTHER): Payer: Medicare Other | Admitting: Thoracic Surgery

## 2011-01-23 DIAGNOSIS — C3490 Malignant neoplasm of unspecified part of unspecified bronchus or lung: Secondary | ICD-10-CM

## 2011-01-24 NOTE — Assessment & Plan Note (Signed)
OFFICE VISIT  [name], [name] DOB:  [birth year]                                        January 23, 2011 CHART #:  [id number]  HISTORY:  The patient is a 77 year old male with a history of stage IIIA non-small cell lung cancer who is currently being treated with both radiation and chemotherapy.  On December 17, 2010, Dr. Edwyna Shell placed a left subclavian Port-A-Cath.  He is in the office on today's date for reexamination.  Currently, he reports that he is having some difficulties related to constitutional symptoms of poor appetite with some weakness.  He has not had a recent fevers or chills.  He has only minimal discomfort related to the Port-A-Cath itself.  The patient was on a Arixtra at the time of placement and did have significant postoperative bruising but per the patient's report, this has improved substantially.  PHYSICAL EXAMINATION:  VITAL SIGNS:  Blood pressure is 113/69, pulse is 80, respirations 20, oxygen saturation is 94% on room air, and temperature is 97.1.  GENERAL:  This a well-developed adult male in no acute distress.  The Port-A-Cath site is examined.  There is some moderate bruising.  There is no definitive hematoma.  The incision itself is healing well without evidence of infection.  PULMONARY: Reveals clear lungs, diminished in the bases.  CARDIAC:  Regular rate and rhythm.  ASSESSMENT:  The patient is doing well following placement of his Port-A- Cath.  He will continue his chemoradiation treatments per the Oncology Services.  We will see him again on a p.r.n. basis for any surgically requested needs.  Rowe Clack, P.A.-C.  Sherryll Burger  D:  01/23/2011  T:  01/24/2011  Job:  [id number]  cc:   Ines Bloomer, M.D. Josph Macho, M.D.

## 2011-01-28 LAB — AFB CULTURE WITH SMEAR (NOT AT ARMC): Acid Fast Smear: NONE SEEN

## 2011-02-05 ENCOUNTER — Other Ambulatory Visit: Payer: Self-pay | Admitting: Hematology & Oncology

## 2011-02-05 ENCOUNTER — Encounter (HOSPITAL_BASED_OUTPATIENT_CLINIC_OR_DEPARTMENT_OTHER): Payer: Medicare Other | Admitting: Hematology & Oncology

## 2011-02-05 DIAGNOSIS — I82B19 Acute embolism and thrombosis of unspecified subclavian vein: Secondary | ICD-10-CM

## 2011-02-05 DIAGNOSIS — Z5111 Encounter for antineoplastic chemotherapy: Secondary | ICD-10-CM

## 2011-02-05 DIAGNOSIS — C3410 Malignant neoplasm of upper lobe, unspecified bronchus or lung: Secondary | ICD-10-CM

## 2011-02-05 DIAGNOSIS — C3490 Malignant neoplasm of unspecified part of unspecified bronchus or lung: Secondary | ICD-10-CM

## 2011-02-05 LAB — COMPREHENSIVE METABOLIC PANEL
AST: 43 U/L — ABNORMAL HIGH (ref 0–37)
Albumin: 3.9 g/dL (ref 3.5–5.2)
Alkaline Phosphatase: 91 U/L (ref 39–117)
BUN: 11 mg/dL (ref 6–23)
Potassium: 4 mEq/L (ref 3.5–5.3)
Sodium: 140 mEq/L (ref 135–145)
Total Protein: 6.5 g/dL (ref 6.0–8.3)

## 2011-02-05 LAB — CBC WITH DIFFERENTIAL (CANCER CENTER ONLY)
EOS%: 1.4 % (ref 0.0–7.0)
MCH: 30 pg (ref 28.0–33.4)
MCHC: 34.8 g/dL (ref 32.0–35.9)
MONO%: 19.6 % — ABNORMAL HIGH (ref 0.0–13.0)
NEUT#: 2.6 10*3/uL (ref 1.5–6.5)
Platelets: 230 10*3/uL (ref 145–400)

## 2011-02-12 ENCOUNTER — Encounter (HOSPITAL_BASED_OUTPATIENT_CLINIC_OR_DEPARTMENT_OTHER): Payer: Medicare Other | Admitting: Hematology & Oncology

## 2011-02-12 DIAGNOSIS — C3410 Malignant neoplasm of upper lobe, unspecified bronchus or lung: Secondary | ICD-10-CM

## 2011-02-12 DIAGNOSIS — R112 Nausea with vomiting, unspecified: Secondary | ICD-10-CM

## 2011-02-18 ENCOUNTER — Encounter (HOSPITAL_BASED_OUTPATIENT_CLINIC_OR_DEPARTMENT_OTHER): Payer: Self-pay

## 2011-02-18 ENCOUNTER — Other Ambulatory Visit: Payer: Self-pay

## 2011-02-18 ENCOUNTER — Emergency Department (HOSPITAL_BASED_OUTPATIENT_CLINIC_OR_DEPARTMENT_OTHER)
Admission: EM | Admit: 2011-02-18 | Discharge: 2011-02-18 | Disposition: A | Discharge status: Home / Self Care | Payer: Medicare Other | Attending: Emergency Medicine | Admitting: Emergency Medicine

## 2011-02-18 ENCOUNTER — Encounter (HOSPITAL_BASED_OUTPATIENT_CLINIC_OR_DEPARTMENT_OTHER): Payer: Medicare Other | Admitting: Hematology & Oncology

## 2011-02-18 DIAGNOSIS — J4489 Other specified chronic obstructive pulmonary disease: Secondary | ICD-10-CM | POA: Insufficient documentation

## 2011-02-18 DIAGNOSIS — I4891 Unspecified atrial fibrillation: Secondary | ICD-10-CM

## 2011-02-18 DIAGNOSIS — I82B19 Acute embolism and thrombosis of unspecified subclavian vein: Secondary | ICD-10-CM

## 2011-02-18 DIAGNOSIS — I480 Paroxysmal atrial fibrillation: Secondary | ICD-10-CM

## 2011-02-18 DIAGNOSIS — Z8679 Personal history of other diseases of the circulatory system: Secondary | ICD-10-CM | POA: Insufficient documentation

## 2011-02-18 DIAGNOSIS — J449 Chronic obstructive pulmonary disease, unspecified: Secondary | ICD-10-CM | POA: Insufficient documentation

## 2011-02-18 DIAGNOSIS — R1310 Dysphagia, unspecified: Secondary | ICD-10-CM

## 2011-02-18 DIAGNOSIS — E119 Type 2 diabetes mellitus without complications: Secondary | ICD-10-CM | POA: Insufficient documentation

## 2011-02-18 DIAGNOSIS — C3410 Malignant neoplasm of upper lobe, unspecified bronchus or lung: Secondary | ICD-10-CM

## 2011-02-18 HISTORY — DX: Acute embolism and thrombosis of unspecified deep veins of unspecified lower extremity: I82.409

## 2011-02-18 HISTORY — DX: Malignant neoplasm of unspecified part of unspecified bronchus or lung: C34.90

## 2011-02-18 LAB — COMPREHENSIVE METABOLIC PANEL
ALT: 100 U/L — ABNORMAL HIGH (ref 0–53)
BUN: 10 mg/dL (ref 6–23)
CO2: 23 mEq/L (ref 19–32)
Calcium: 9.6 mg/dL (ref 8.4–10.5)
Creatinine, Ser: 0.9 mg/dL (ref 0.50–1.35)
GFR calc Af Amer: >60 mL/min (ref >60)
GFR calc non Af Amer: >60 mL/min (ref >60)
Glucose, Bld: 109 mg/dL — ABNORMAL HIGH (ref 70–99)
Sodium: 138 mEq/L (ref 135–145)
Total Protein: 6.9 g/dL (ref 6.0–8.3)

## 2011-02-18 LAB — CBC
HCT: 35 % — ABNORMAL LOW (ref 39.0–52.0)
Hemoglobin: 12.2 g/dL — ABNORMAL LOW (ref 13.0–17.0)
MCH: 29.7 pg (ref 26.0–34.0)
MCHC: 34.9 g/dL (ref 30.0–36.0)
MCV: 85.2 fL (ref 78.0–100.0)
RBC: 4.11 MIL/uL — ABNORMAL LOW (ref 4.22–5.81)

## 2011-02-18 LAB — DIFFERENTIAL
Band Neutrophils: 1 % (ref 0–10)
Blasts: 0 %
Eosinophils Absolute: 0 10*3/uL (ref 0.0–0.7)
Metamyelocytes Relative: 0 %
Monocytes Absolute: 0.2 10*3/uL (ref 0.1–1.0)
Monocytes Relative: 6 % (ref 3–12)
Myelocytes: 0 %

## 2011-02-18 LAB — CARDIAC PANEL(CRET KIN+CKTOT+MB+TROPI)
CK, MB: 2.3 ng/mL (ref 0.3–4.0)
Total CK: 50 U/L (ref 7–232)

## 2011-02-18 LAB — PROTIME-INR: Prothrombin Time: 13.8 seconds (ref 11.6–15.2)

## 2011-02-18 LAB — D-DIMER, QUANTITATIVE: D-Dimer, Quant: 0.94 ug/mL-FEU — ABNORMAL HIGH (ref 0.00–0.48)

## 2011-02-18 MED ORDER — DILTIAZEM HCL 100 MG IV SOLR
INTRAVENOUS | Status: AC
  Filled 2011-02-18: qty 100

## 2011-02-18 MED ORDER — DILTIAZEM HCL 100 MG IV SOLR: 5.0000 mg/h | INTRAVENOUS | Status: DC

## 2011-02-18 MED ORDER — POTASSIUM CHLORIDE CRYS ER 20 MEQ PO TBCR
40.0000 meq | EXTENDED_RELEASE_TABLET | Freq: Once | ORAL | Status: AC
  Administered 2011-02-18: 40 meq via ORAL
  Filled 2011-02-18: qty 2

## 2011-02-18 MED ORDER — HEPARIN SOD (PORK) LOCK FLUSH 100 UNIT/ML IV SOLN
INTRAVENOUS | Status: AC
  Filled 2011-02-18: qty 5

## 2011-02-18 MED ORDER — DILTIAZEM HCL 25 MG/5ML IV SOLN
INTRAVENOUS | Status: AC
  Filled 2011-02-18: qty 5

## 2011-02-18 MED ORDER — SODIUM CHLORIDE 0.9 % IV BOLUS (SEPSIS)
500.0000 mL | Freq: Once | INTRAVENOUS | Status: AC
  Administered 2011-02-18: 17:00:00 via INTRAVENOUS

## 2011-02-18 MED ORDER — SODIUM CHLORIDE 0.9 % IV BOLUS (SEPSIS): 500.0000 mL | Freq: Once | INTRAVENOUS | Status: DC

## 2011-02-18 MED ORDER — DILTIAZEM HCL 50 MG/10ML IV SOLN
10.0000 mg | Freq: Once | INTRAVENOUS | Status: DC
  Filled 2011-02-18: qty 2

## 2011-02-18 NOTE — ED Provider Notes (Addendum)
History   Scribed for Patrick Quarry, MD, the patient was seen in room MH08/MH08. This chart was scribed by Clarita Crane. This patient's care was started at 3:09PM.   CSN: [id number] Arrival date & time: 02/18/2011  2:41 PM  Chief Complaint  Patient presents with  . Atrial Fibrillation   HPI SALIK GREWELL is a 77 y.o. male who presents to the Emergency Department after referral to ED by his oncologist for irregular heart beat during evaluation this afternoon. Patient notes he has experienced intermittent substernal chest pain described as a burning and pressure with associated n/v/d for the past several weeks but states he is not currently experiencing chest pain. Denies fever. Patient reports he is currently receiving chemotherapy and radiation for lung CA which was dx 12/08/2010 with last chemotherapy treatment performed on 02/05/2011. Patient reports the chest pain has worsened as radiation treatments applied to his chest have continued and notes he has had a total of 32 radiation treatments performed. Patient with h/o COPD, DM controlled with Metformin and Glipizide and DVT. States he had a cardiac catherization performed 2-3 years ago. Patient is a former smoker and denies ETOH use.   HPI ELEMENTS: Location: substernal  Onset: several weeks ago Duration: persistent since onset and worsening as radiation treatments have continued  Timing: intermittent  Quality: burning and pressure   Modifying factors: aggravated by radiation treatments.  Context:  as above  Associated symptoms: n/v/d. Denies fever.   PAST MEDICAL HISTORY:  Past Medical History  Diagnosis Date  . Diabetes mellitus   . Lung mass   . Lung cancer   . DVT (deep venous thrombosis)     PAST SURGICAL HISTORY:  History reviewed. No pertinent past surgical history.  MEDICATIONS:  Previous Medications   No medications on file     ALLERGIES:  Allergies as of 02/18/2011  . (No Known Allergies)     FAMILY HISTORY:    No family history on file.   SOCIAL HISTORY: History   Social History  . Marital Status: Married    Spouse Name: N/A    Number of Children: N/A  . Years of Education: N/A   Social History Main Topics  . Smoking status: Former Games developer  . Smokeless tobacco: None  . Alcohol Use: No  . Drug Use:   . Sexually Active:    Other Topics Concern  . None   Social History Narrative  . None       Review of Systems 10 Systems reviewed and are negative for acute change except as noted in the HPI.  Physical Exam  BP 121/78  Pulse 44  Temp(Src) 98.1 F (36.7 C) (Oral)  Resp 18  SpO2 99%  Physical Exam  Nursing note and vitals reviewed. Constitutional: He is oriented to person, place, and time. He appears well-developed and well-nourished. No distress.  HENT:  Head: Normocephalic and atraumatic.  Eyes: Conjunctivae are normal. Pupils are equal, round, and reactive to light.  Neck: Neck supple. No thyromegaly present.  Cardiovascular: Normal heart sounds and normal pulses.  An irregular rhythm present. Tachycardia present.  Exam reveals no gallop and no friction rub.   No murmur heard. Pulmonary/Chest: Effort normal and breath sounds normal. He has no wheezes.       Left subclavian port-a-cath in place.   Abdominal: Soft. Bowel sounds are normal. He exhibits no distension. There is no tenderness.  Musculoskeletal: Normal range of motion. He exhibits no edema and no tenderness.  Lymphadenopathy:  He has no cervical adenopathy.  Neurological: He is alert and oriented to person, place, and time. No sensory deficit.  Skin: Skin is warm and dry.       Darkened area of skin to midline of upper back.   Psychiatric: He has a normal mood and affect. His behavior is normal.    ED Course  Procedures  OTHER DATA REVIEWED: Nursing notes, vital signs, and past medical records reviewed. Lab results reviewed and considered Imaging results reviewed and considered  DIAGNOSTIC  STUDIES: Oxygen Saturation is 99% on room air, normal by my interpretation.    LABS / RADIOLOGY: Results for orders placed during the hospital encounter of 02/18/11  CARDIAC PANEL(CRET KIN+CKTOT+MB+TROPI)      Component Value Range   Total CK 50  7 - 232 (U/L)   CK, MB 2.3  0.3 - 4.0 (ng/mL)   Troponin I <0.30  <0.30 (ng/mL)   Relative Index RELATIVE INDEX IS INVALID  0.0 - 2.5   CBC      Component Value Range   WBC 3.9 (*) 4.0 - 10.5 (K/uL)   RBC 4.11 (*) 4.22 - 5.81 (MIL/uL)   Hemoglobin 12.2 (*) 13.0 - 17.0 (g/dL)   HCT 91.4 (*) 78.2 - 52.0 (%)   MCV 85.2  78.0 - 100.0 (fL)   MCH 29.7  26.0 - 34.0 (pg)   MCHC 34.9  30.0 - 36.0 (g/dL)   RDW 95.6 (*) 21.3 - 15.5 (%)   Platelets 142 (*) 150 - 400 (K/uL)  COMPREHENSIVE METABOLIC PANEL      Component Value Range   Sodium 138  135 - 145 (mEq/L)   Potassium 3.1 (*) 3.5 - 5.1 (mEq/L)   Chloride 100  96 - 112 (mEq/L)   CO2 23  19 - 32 (mEq/L)   Glucose, Bld 109 (*) 70 - 99 (mg/dL)   BUN 10  6 - 23 (mg/dL)   Creatinine, Ser 0.86  0.50 - 1.35 (mg/dL)   Calcium 9.6  8.4 - 57.8 (mg/dL)   Total Protein 6.9  6.0 - 8.3 (g/dL)   Albumin 3.3 (*) 3.5 - 5.2 (g/dL)   AST 469 (*) 0 - 37 (U/L)   ALT 100 (*) 0 - 53 (U/L)   Alkaline Phosphatase 100  39 - 117 (U/L)   Total Bilirubin 0.3  0.3 - 1.2 (mg/dL)   GFR calc non Af Amer >60  >60 (mL/min)   GFR calc Af Amer >60  >60 (mL/min)  PROTIME-INR      Component Value Range   Prothrombin Time 13.8  11.6 - 15.2 (seconds)   INR 1.04  0.00 - 1.49   D-DIMER, QUANTITATIVE      Component Value Range   D-Dimer, Quant 0.94 (*) 0.00 - 0.48 (ug/mL-FEU)  APTT      Component Value Range   aPTT 142 (*) 24 - 37 (seconds)  DIFFERENTIAL      Component Value Range   Neutrophils Relative PENDING  43 - 77 (%)   Neutro Abs PENDING  1.7 - 7.7 (K/uL)   Band Neutrophils PENDING  0 - 10 (%)   Lymphocytes Relative PENDING  12 - 46 (%)   Lymphs Abs PENDING  0.7 - 4.0 (K/uL)   Monocytes Relative PENDING  3 - 12  (%)   Monocytes Absolute PENDING  0.1 - 1.0 (K/uL)   Eosinophils Relative PENDING  0 - 5 (%)   Eosinophils Absolute PENDING  0.0 - 0.7 (K/uL)   Basophils Relative PENDING  0 - 1 (%)   Basophils Absolute PENDING  0.0 - 0.1 (K/uL)   WBC Morphology PENDING     RBC Morphology PENDING     Smear Review PENDING     nRBC PENDING  0 (/100 WBC)   Metamyelocytes Relative PENDING     Myelocytes PENDING     Promyelocytes Absolute PENDING     Blasts PENDING     No results found.  PROCEDURES:  ED COURSE / COORDINATION OF CARE: Orders Placed This Encounter  Procedures  . Cardiac panel Timed (cret kin+cktot+mb+tropi)  . CBC  . Comprehensive metabolic panel  . Protime-INR  . D-dimer, quantitative  . APTT  . Differential  . ED EKG     MDM: Differential Diagnosis: Patient with new onset a. Fib with hr 120 to 140s.  Patient with episodic chest discomfort which he has had for weeks to months and atttibuted to mediastinal mets. PLAN: Planned cardizem but patient converted to nsr .  Patient's care discussed with Dr.  Kathee [facility] and patient has remained stable here in the emergency department Patient on lovenox due to pe.  Patient has remained in a normal sinus rhythm. He does not had any chest pain here. Dr. Felipa Eth will see him in close followup in the office. Patient understands to return if he has any chest pain or shortness of breath. He has been rehydrated with a liter of normal saline. The patient is to return the emergency department if there is any worsening of symptoms. I have reviewed the discharge instructions with the patient/family  CONDITION ON DISCHARGE:  Date: 02/18/2011  Rate: 116   Rhythm: atrial fibrillation  QRS Axis: normal  Intervals: normal  ST/T Wave abnormalities: nonspecific ST/T changes  Conduction Disutrbances:none  Narrative Interpretation:   Old EKG Reviewed: changes noted  Repeat ekg  Date: 02/18/2011  Rate: 79  Rhythm: normal sinus rhythm  QRS Axis: normal   Intervals: normal  ST/T Wave abnormalities: nonspecific ST/T changes  Conduction Disutrbances:none  Narrative Interpretation:   Old EKG Reviewed: changes noted    MEDICATIONS GIVEN IN THE E.D.  Medications  METFORMIN HCL PO (not administered)  GLIPIZIDE PO (not administered)  Enoxaparin Sodium (LOVENOX [place]) (not administered)  Prochlorperazine Maleate (COMPAZINE PO) (not administered)  diltiazem (CARDIZEM) injection SOLN 10 mg (not administered)  diltiazem (CARDIZEM) 100 mg in dextrose 5 % 100 mL infusion (not administered)  diltiazem (CARDIZEM) 100 MG injection (not administered)  diltiazem (CARDIZEM) 25 MG/5ML injection (not administered)    I personally performed the services described in this documentation, which was scribed in my presence. The recorded information has been reviewed and considered. No att. providers found        Patrick Quarry, MD 02/18/11 1639  Patrick Quarry, MD 02/20/11 1478  Patrick Quarry, MD 03/07/11 [id number]

## 2011-02-18 NOTE — ED Notes (Signed)
Md in room to eval pt

## 2011-02-18 NOTE — ED Notes (Signed)
cardiazem not started pt converted self to SR , md made aware, EKG done

## 2011-02-18 NOTE — ED Notes (Signed)
Was sent by Onc for new onset Afib-pt denies pain

## 2011-02-18 NOTE — ED Notes (Signed)
Port a cath de accessed and flushed with heparin flush per protocal policy.

## 2011-02-19 ENCOUNTER — Other Ambulatory Visit: Payer: Self-pay | Admitting: Radiation Oncology

## 2011-02-19 LAB — BASIC METABOLIC PANEL
BUN: 10 mg/dL (ref 6–23)
Chloride: 103 mEq/L (ref 96–112)
Potassium: 3.5 mEq/L (ref 3.5–5.3)
Sodium: 138 mEq/L (ref 135–145)

## 2011-03-20 ENCOUNTER — Other Ambulatory Visit (HOSPITAL_BASED_OUTPATIENT_CLINIC_OR_DEPARTMENT_OTHER): Payer: Medicare Other

## 2011-03-26 ENCOUNTER — Other Ambulatory Visit: Payer: Self-pay | Admitting: Hematology & Oncology

## 2011-03-26 ENCOUNTER — Other Ambulatory Visit: Payer: Medicare Other | Admitting: Lab

## 2011-03-26 DIAGNOSIS — Z5111 Encounter for antineoplastic chemotherapy: Secondary | ICD-10-CM

## 2011-03-26 LAB — CBC WITH DIFFERENTIAL (CANCER CENTER ONLY)
BASO#: 0 10*3/uL (ref 0.0–0.2)
Eosinophils Absolute: 0.1 10*3/uL (ref 0.0–0.5)
HCT: 39.5 % (ref 38.7–49.9)
HGB: 13.8 g/dL (ref 13.0–17.1)
LYMPH%: 26.4 % (ref 14.0–48.0)
MCH: 31 pg (ref 28.0–33.4)
MCV: 89 fL (ref 82–98)
MONO#: 0.6 10*3/uL (ref 0.1–0.9)
MONO%: 13.3 % — ABNORMAL HIGH (ref 0.0–13.0)
NEUT%: 58.9 % (ref 40.0–80.0)
Platelets: 160 10*3/uL (ref 145–400)
RBC: 4.45 10*6/uL (ref 4.20–5.70)
WBC: 4.3 10*3/uL (ref 4.0–10.0)

## 2011-03-26 LAB — COMPREHENSIVE METABOLIC PANEL
Alkaline Phosphatase: 80 U/L (ref 39–117)
BUN: 14 mg/dL (ref 6–23)
CO2: 25 mEq/L (ref 19–32)
Creatinine, Ser: 0.94 mg/dL (ref 0.50–1.35)
Glucose, Bld: 217 mg/dL — ABNORMAL HIGH (ref 70–99)
Sodium: 139 mEq/L (ref 135–145)
Total Bilirubin: 0.5 mg/dL (ref 0.3–1.2)
Total Protein: 6.7 g/dL (ref 6.0–8.3)

## 2011-03-27 LAB — HEPARIN ANTI-XA: Heparin LMW: 1.25 IU/mL

## 2011-04-03 ENCOUNTER — Ambulatory Visit (HOSPITAL_BASED_OUTPATIENT_CLINIC_OR_DEPARTMENT_OTHER)
Admission: RE | Admit: 2011-04-03 | Discharge: 2011-04-03 | Disposition: A | Discharge status: Home / Self Care | Payer: Medicare Other | Source: Ambulatory Visit | Attending: Hematology & Oncology | Admitting: Hematology & Oncology

## 2011-04-03 ENCOUNTER — Ambulatory Visit (INDEPENDENT_AMBULATORY_CARE_PROVIDER_SITE_OTHER)
Admission: RE | Admit: 2011-04-03 | Discharge: 2011-04-03 | Disposition: A | Discharge status: Home / Self Care | Payer: Medicare Other | Source: Ambulatory Visit | Attending: Hematology & Oncology | Admitting: Hematology & Oncology

## 2011-04-03 DIAGNOSIS — G319 Degenerative disease of nervous system, unspecified: Secondary | ICD-10-CM

## 2011-04-03 DIAGNOSIS — C3490 Malignant neoplasm of unspecified part of unspecified bronchus or lung: Secondary | ICD-10-CM | POA: Insufficient documentation

## 2011-04-03 DIAGNOSIS — J438 Other emphysema: Secondary | ICD-10-CM

## 2011-04-03 DIAGNOSIS — J984 Other disorders of lung: Secondary | ICD-10-CM

## 2011-04-03 MED ORDER — FLUDEOXYGLUCOSE F - 18 (FDG) INJECTION: 11.9000 | Freq: Once | INTRAVENOUS | Status: DC | PRN

## 2011-04-03 MED ORDER — IOHEXOL 300 MG/ML  SOLN
80.0000 mL | Freq: Once | INTRAMUSCULAR | Status: AC | PRN
  Administered 2011-04-03: 80 mL via INTRAVENOUS

## 2011-04-08 ENCOUNTER — Ambulatory Visit
Admission: RE | Admit: 2011-04-08 | Discharge: 2011-04-08 | Disposition: A | Discharge status: Home / Self Care | Payer: Medicare Other | Source: Ambulatory Visit | Attending: Radiation Oncology | Admitting: Radiation Oncology

## 2011-04-10 ENCOUNTER — Other Ambulatory Visit: Payer: Self-pay | Admitting: Hematology & Oncology

## 2011-04-10 ENCOUNTER — Encounter (HOSPITAL_BASED_OUTPATIENT_CLINIC_OR_DEPARTMENT_OTHER): Payer: Medicare Other | Admitting: Hematology & Oncology

## 2011-04-10 DIAGNOSIS — Z7901 Long term (current) use of anticoagulants: Secondary | ICD-10-CM

## 2011-04-10 DIAGNOSIS — R1310 Dysphagia, unspecified: Secondary | ICD-10-CM

## 2011-04-10 DIAGNOSIS — I82B19 Acute embolism and thrombosis of unspecified subclavian vein: Secondary | ICD-10-CM

## 2011-04-10 DIAGNOSIS — Z5111 Encounter for antineoplastic chemotherapy: Secondary | ICD-10-CM

## 2011-04-10 DIAGNOSIS — I4891 Unspecified atrial fibrillation: Secondary | ICD-10-CM

## 2011-04-10 DIAGNOSIS — C3410 Malignant neoplasm of upper lobe, unspecified bronchus or lung: Secondary | ICD-10-CM

## 2011-04-10 LAB — CBC WITH DIFFERENTIAL (CANCER CENTER ONLY)
BASO%: 0.3 % (ref 0.0–2.0)
Eosinophils Absolute: 0.1 10*3/uL (ref 0.0–0.5)
HCT: 41.9 % (ref 38.7–49.9)
LYMPH#: 1.2 10*3/uL (ref 0.9–3.3)
MCV: 90 fL (ref 82–98)
MONO#: 0.5 10*3/uL (ref 0.1–0.9)
NEUT%: 55.1 % (ref 40.0–80.0)
RBC: 4.65 10*6/uL (ref 4.20–5.70)
RDW: 15.6 % (ref 11.1–15.7)
WBC: 3.8 10*3/uL — ABNORMAL LOW (ref 4.0–10.0)

## 2011-04-10 LAB — BASIC METABOLIC PANEL
CO2: 25 mEq/L (ref 19–32)
Chloride: 103 mEq/L (ref 96–112)
Creatinine, Ser: 0.94 mg/dL (ref 0.50–1.35)
Glucose, Bld: 216 mg/dL — ABNORMAL HIGH (ref 70–99)
Sodium: 137 mEq/L (ref 135–145)

## 2011-04-12 ENCOUNTER — Other Ambulatory Visit: Payer: Self-pay | Admitting: Thoracic Surgery

## 2011-04-12 DIAGNOSIS — C3490 Malignant neoplasm of unspecified part of unspecified bronchus or lung: Secondary | ICD-10-CM

## 2011-04-15 DIAGNOSIS — I82409 Acute embolism and thrombosis of unspecified deep veins of unspecified lower extremity: Secondary | ICD-10-CM | POA: Insufficient documentation

## 2011-04-15 DIAGNOSIS — C3490 Malignant neoplasm of unspecified part of unspecified bronchus or lung: Secondary | ICD-10-CM | POA: Insufficient documentation

## 2011-04-15 DIAGNOSIS — J309 Allergic rhinitis, unspecified: Secondary | ICD-10-CM | POA: Insufficient documentation

## 2011-04-15 DIAGNOSIS — J449 Chronic obstructive pulmonary disease, unspecified: Secondary | ICD-10-CM | POA: Insufficient documentation

## 2011-04-15 DIAGNOSIS — E1142 Type 2 diabetes mellitus with diabetic polyneuropathy: Secondary | ICD-10-CM | POA: Insufficient documentation

## 2011-04-16 ENCOUNTER — Encounter: Payer: Self-pay | Admitting: Thoracic Surgery

## 2011-04-16 ENCOUNTER — Ambulatory Visit (INDEPENDENT_AMBULATORY_CARE_PROVIDER_SITE_OTHER): Payer: Medicare Other | Admitting: Thoracic Surgery

## 2011-04-16 ENCOUNTER — Ambulatory Visit
Admission: RE | Admit: 2011-04-16 | Discharge: 2011-04-16 | Disposition: A | Discharge status: Home / Self Care | Payer: Medicare Other | Source: Ambulatory Visit | Attending: Thoracic Surgery | Admitting: Thoracic Surgery

## 2011-04-16 VITALS — BP 130/70 | HR 74 | Resp 20 | Ht 71.0 in | Wt 207.0 lb

## 2011-04-16 DIAGNOSIS — C3490 Malignant neoplasm of unspecified part of unspecified bronchus or lung: Secondary | ICD-10-CM

## 2011-04-16 NOTE — Progress Notes (Signed)
HPI the patient returns today for followup. The patient had a Port-A-Cath inserted several months ago and had problems postoperatively with a hematoma. The Port-A-Cath is not being use recently. Chest x-ray shows that the Port-A-Cath is retracted out of the superior vena cava and is in the left innominate vein. This he has for more scheduled treatments. He is presently on Lovenox. We plan to revise the Port-A-Cath on November 13. We will stop his Lovenox 48 hours before. This   Current Outpatient Prescriptions  Medication Sig Dispense Refill  . enoxaparin (LOVENOX) 100 MG/ML SOLN Inject 100 mg into the skin daily.        . folic acid (FOLVITE) 1 MG tablet Take 1 mg by mouth daily.        Marland Kitchen glucosamine-chondroitin 500-400 MG tablet Take 2 tablets by mouth daily.        Marland Kitchen guaiFENesin (MUCINEX) 600 MG 12 hr tablet Take 1,200 mg by mouth 2 (two) times daily.        . metFORMIN (GLUCOPHAGE) 1000 MG tablet Take 1,000 mg by mouth 2 (two) times daily with a meal.        . simvastatin (ZOCOR) 40 MG tablet Take 40 mg by mouth daily.        Marland Kitchen zolpidem (AMBIEN) 5 MG tablet Take 5 mg by mouth at bedtime as needed. Sleep          Review of Systems:unchanged   Physical Exam  Constitutional: He appears well-developed and well-nourished.  Cardiovascular: Normal rate, regular rhythm and normal heart sounds.   Pulmonary/Chest: Effort normal and breath sounds normal. No respiratory distress.     Diagnostic Tests: Chest x-ray that shows that the Port-A-Cath is retracted.   Impression: Stage IV non-small cell lung cancer abnormal position of Port-A-Cath   Plan: Revision of Port-A-Cath

## 2011-04-17 ENCOUNTER — Other Ambulatory Visit: Payer: Self-pay

## 2011-04-17 DIAGNOSIS — C3490 Malignant neoplasm of unspecified part of unspecified bronchus or lung: Secondary | ICD-10-CM

## 2011-04-19 ENCOUNTER — Encounter (HOSPITAL_COMMUNITY): Payer: Self-pay | Admitting: Pharmacist

## 2011-04-19 ENCOUNTER — Telehealth: Payer: Self-pay

## 2011-04-19 NOTE — Telephone Encounter (Signed)
[name] is sch'ed for Revision of Port-A-Cath on November 13 ,2012. He was originally instructed to hold the Lovenox 48 hours before procedure.  Dr Edwyna Shell was notified and instructed pt to hold Arixdra 10 mg injections 48 hours prior to procedure.  Pt Notified.

## 2011-04-22 ENCOUNTER — Other Ambulatory Visit: Payer: Self-pay

## 2011-04-22 ENCOUNTER — Encounter (HOSPITAL_COMMUNITY)
Admission: RE | Admit: 2011-04-22 | Discharge: 2011-04-22 | Disposition: A | Discharge status: Home / Self Care | Payer: Medicare Other | Source: Ambulatory Visit | Attending: Thoracic Surgery | Admitting: Thoracic Surgery

## 2011-04-22 ENCOUNTER — Encounter (HOSPITAL_COMMUNITY): Payer: Self-pay

## 2011-04-22 DIAGNOSIS — C3490 Malignant neoplasm of unspecified part of unspecified bronchus or lung: Secondary | ICD-10-CM

## 2011-04-22 LAB — COMPREHENSIVE METABOLIC PANEL
ALT: 8 U/L (ref 0–53)
Calcium: 9.6 mg/dL (ref 8.4–10.5)
GFR calc Af Amer: 85 mL/min — ABNORMAL LOW (ref >90)
Glucose, Bld: 134 mg/dL — ABNORMAL HIGH (ref 70–99)
Sodium: 138 mEq/L (ref 135–145)
Total Protein: 6.4 g/dL (ref 6.0–8.3)

## 2011-04-22 LAB — SURGICAL PCR SCREEN
MRSA, PCR: NEGATIVE
Staphylococcus aureus: POSITIVE — AB

## 2011-04-22 LAB — CBC
Hemoglobin: 13.6 g/dL (ref 13.0–17.0)
MCH: 31.4 pg (ref 26.0–34.0)
MCHC: 34.2 g/dL (ref 30.0–36.0)

## 2011-04-22 LAB — PROTIME-INR
INR: 0.99 (ref 0.00–1.49)
Prothrombin Time: 13.3 seconds (ref 11.6–15.2)

## 2011-04-22 MED ORDER — DEXTROSE 5 % IV SOLN
1.5000 g | INTRAVENOUS | Status: DC
  Filled 2011-04-22: qty 1.5

## 2011-04-22 NOTE — Pre-Procedure Instructions (Signed)
20 [name]  04/22/2011   Your procedure is scheduled on: Tuesday, November 13TH                                                                                 Report to [facility] at  5:30 AM.  Call this number if you have problems the morning of surgery: [phone]   Remember:   Do not eat food:After Midnight MONDAY  Do not drink clear liquids: 4 Hours before arrival (1:30 AM).  Take these medicines the morning of surgery with A SIP OF WATER:  NONE   Do not wear jewelry, make-up or nail polish.   Do not wear lotions, powders, or perfumes. You may wear deodorant.   Do not shave 48 hours prior to surgery.   Do not bring valuables to the hospital.   Contacts, dentures or bridgework may not be worn into surgery.   Leave suitcase in the car. After surgery it may be brought to your room.  For patients admitted to the hospital, checkout time is 11:00 AM the day of discharge.   Patients discharged the day of surgery will not be allowed to drive home.  Name and phone number of your driver:    DONNA ELLIS  --  DTR   Special Instructions: CHG Shower Use Special Wash: 1/2 bottle night before surgery and 1/2 bottle morning of surgery. and N/A   Please read over the following fact sheets that you were given: Pain Booklet, MRSA Information and Surgical Site Infection Prevention

## 2011-04-22 NOTE — Pre-Procedure Instructions (Signed)
20 [name]  04/22/2011  Your procedure is scheduled on:  November 13TH  TUESDAY  Report to [facility] at 5:30 AM.  Call this number if you have problems the morning of surgery: [phone]   Remember:   Do not eat food:After Midnight. MONDAY  Do not drink clear liquids: 4 Hours before arrival 1:30AM.  Take these medicines the morning of surgery with A SIP OF WATER:  NOTHING   Do not wear jewelry, make-up or nail polish.   Do not wear lotions, powders, or perfumes. You may wear deodorant.   Do not shave 48 hours prior to surgery.   Do not bring valuables to the hospital   Contacts, dentures or bridgework may not be worn into surgery .   Leave suitcase in the car. After surgery it may be brought to your room.  For patients admitted to the hospital, checkout time is 11:00 AM the day of discharge.   Patients discharged the day of surgery will not be allowed to drive home.   Name and phone number of your driver:  DONNA ELLIS  -- DAUGHTER                                      Special Instructions: CHG Shower Use Special Wash: 1/2 bottle night before surgery and 1/2 bottle morning of surgery.   Please read over the following fact sheets that you were given: Pain Booklet, MRSA Information and Surgical Site Infection Prevention

## 2011-04-23 ENCOUNTER — Encounter (HOSPITAL_COMMUNITY)
Admission: RE | Disposition: A | Discharge status: Home / Self Care | Payer: Self-pay | Source: Ambulatory Visit | Attending: Thoracic Surgery

## 2011-04-23 ENCOUNTER — Ambulatory Visit (HOSPITAL_COMMUNITY): Payer: Medicare Other

## 2011-04-23 ENCOUNTER — Ambulatory Visit (HOSPITAL_COMMUNITY): Payer: Medicare Other | Admitting: Certified Registered"

## 2011-04-23 ENCOUNTER — Encounter (HOSPITAL_COMMUNITY): Payer: Self-pay

## 2011-04-23 ENCOUNTER — Ambulatory Visit (HOSPITAL_COMMUNITY)
Admission: RE | Admit: 2011-04-23 | Discharge: 2011-04-23 | Disposition: A | Discharge status: Home / Self Care | Payer: Medicare Other | Source: Ambulatory Visit | Attending: Thoracic Surgery | Admitting: Thoracic Surgery

## 2011-04-23 ENCOUNTER — Encounter (HOSPITAL_COMMUNITY): Payer: Self-pay | Admitting: Certified Registered"

## 2011-04-23 DIAGNOSIS — C3490 Malignant neoplasm of unspecified part of unspecified bronchus or lung: Secondary | ICD-10-CM

## 2011-04-23 DIAGNOSIS — Z01812 Encounter for preprocedural laboratory examination: Secondary | ICD-10-CM | POA: Insufficient documentation

## 2011-04-23 DIAGNOSIS — Y849 Medical procedure, unspecified as the cause of abnormal reaction of the patient, or of later complication, without mention of misadventure at the time of the procedure: Secondary | ICD-10-CM | POA: Insufficient documentation

## 2011-04-23 DIAGNOSIS — Z01818 Encounter for other preprocedural examination: Secondary | ICD-10-CM | POA: Insufficient documentation

## 2011-04-23 DIAGNOSIS — T82898A Other specified complication of vascular prosthetic devices, implants and grafts, initial encounter: Secondary | ICD-10-CM | POA: Insufficient documentation

## 2011-04-23 HISTORY — PX: PORTACATH PLACEMENT: SHX2246

## 2011-04-23 LAB — GLUCOSE, CAPILLARY: Glucose-Capillary: 143 mg/dL — ABNORMAL HIGH (ref 70–99)

## 2011-04-23 SURGERY — INSERTION, TUNNELED CENTRAL VENOUS DEVICE, WITH PORT
Anesthesia: LOCAL | Site: Chest | Wound class: Clean

## 2011-04-23 MED ORDER — FENTANYL CITRATE 0.05 MG/ML IJ SOLN
INTRAMUSCULAR | Status: DC | PRN
  Administered 2011-04-23 (×2): 50 ug via INTRAVENOUS

## 2011-04-23 MED ORDER — PROPOFOL 10 MG/ML IV EMUL
INTRAVENOUS | Status: DC | PRN
  Administered 2011-04-23: 110 mg via INTRAVENOUS

## 2011-04-23 MED ORDER — ONDANSETRON HCL 4 MG/2ML IJ SOLN
4.0000 mg | Freq: Four times a day (QID) | INTRAMUSCULAR | Status: DC | PRN
  Filled 2011-04-23: qty 2

## 2011-04-23 MED ORDER — LACTATED RINGERS IV SOLN
INTRAVENOUS | Status: DC | PRN
  Administered 2011-04-23: 07:00:00 via INTRAVENOUS

## 2011-04-23 MED ORDER — SODIUM CHLORIDE 0.9 % IR SOLN
Status: DC | PRN
  Administered 2011-04-23: 1000 mL

## 2011-04-23 MED ORDER — SODIUM CHLORIDE 0.9 % IR SOLN
Status: DC | PRN
  Administered 2011-04-23: 09:00:00

## 2011-04-23 MED ORDER — ONDANSETRON HCL 4 MG/2ML IJ SOLN
INTRAMUSCULAR | Status: DC | PRN
  Administered 2011-04-23: 4 mg via INTRAVENOUS

## 2011-04-23 MED ORDER — PROMETHAZINE HCL 25 MG/ML IJ SOLN
12.5000 mg | Freq: Four times a day (QID) | INTRAMUSCULAR | Status: DC | PRN
  Filled 2011-04-23: qty 1

## 2011-04-23 SURGICAL SUPPLY — 45 items
ADH SKN CLS APL DERMABOND .7 (GAUZE/BANDAGES/DRESSINGS) ×1
BAG DECANTER FOR FLEXI CONT (MISCELLANEOUS) ×2 IMPLANT
BLADE SURG 11 STRL SS (BLADE) ×2 IMPLANT
CANISTER SUCTION 2500CC (MISCELLANEOUS) ×2 IMPLANT
CLOTH BEACON ORANGE TIMEOUT ST (SAFETY) ×2 IMPLANT
COVER SURGICAL LIGHT HANDLE (MISCELLANEOUS) ×4 IMPLANT
DERMABOND ADVANCED (GAUZE/BANDAGES/DRESSINGS) ×1
DERMABOND ADVANCED .7 DNX12 (GAUZE/BANDAGES/DRESSINGS) ×1 IMPLANT
DRAPE C-ARM 42X72 X-RAY (DRAPES) ×2 IMPLANT
DRAPE CHEST BREAST 15X10 FENES (DRAPES) ×2 IMPLANT
ELECT REM PT RETURN 9FT ADLT (ELECTROSURGICAL) ×2
ELECTRODE REM PT RTRN 9FT ADLT (ELECTROSURGICAL) ×1 IMPLANT
GAUZE SPONGE 2X2 8PLY STRL LF (GAUZE/BANDAGES/DRESSINGS) IMPLANT
GLOVE BIOGEL PI IND STRL 6.5 (GLOVE) IMPLANT
GLOVE BIOGEL PI IND STRL 7.5 (GLOVE) IMPLANT
GLOVE BIOGEL PI INDICATOR 6.5 (GLOVE) ×1
GLOVE BIOGEL PI INDICATOR 7.5 (GLOVE) ×1
GLOVE ECLIPSE 6.5 STRL STRAW (GLOVE) ×1 IMPLANT
GLOVE SURG SIGNA 7.5 PF LTX (GLOVE) ×2 IMPLANT
GOWN BRE IMP PREV XXLGXLNG (GOWN DISPOSABLE) ×2 IMPLANT
GOWN STRL NON-REIN LRG LVL3 (GOWN DISPOSABLE) ×2 IMPLANT
GUIDEWIRE UNCOATED ST S 7038 (WIRE) IMPLANT
INTRODUCER 13FR (MISCELLANEOUS) IMPLANT
INTRODUCER COOK 11FR (CATHETERS) IMPLANT
KIT BASIN OR (CUSTOM PROCEDURE TRAY) ×2 IMPLANT
KIT PORT POWER 9.6FR MRI PREA (Catheter) IMPLANT
KIT PORT POWER ISP 8FR (Catheter) ×1 IMPLANT
KIT POWER CATH 8FR (Catheter) IMPLANT
KIT ROOM TURNOVER OR (KITS) ×2 IMPLANT
NEEDLE 22X1 1/2 (OR ONLY) (NEEDLE) ×2 IMPLANT
NS IRRIG 1000ML POUR BTL (IV SOLUTION) ×2 IMPLANT
PACK GENERAL/GYN (CUSTOM PROCEDURE TRAY) ×2 IMPLANT
PAD ARMBOARD 7.5X6 YLW CONV (MISCELLANEOUS) ×2 IMPLANT
SET SHEATH INTRODUCER 10FR (MISCELLANEOUS) IMPLANT
SPONGE GAUZE 2X2 STER 10/PKG (GAUZE/BANDAGES/DRESSINGS) ×1
SPONGE GAUZE 4X4 12PLY (GAUZE/BANDAGES/DRESSINGS) ×2 IMPLANT
SUT SILK 2 0 SH (SUTURE) ×2 IMPLANT
SUT VIC AB 3-0 SH 27 (SUTURE) ×2
SUT VIC AB 3-0 SH 27X BRD (SUTURE) ×1 IMPLANT
SYR 20CC LL (SYRINGE) ×2 IMPLANT
SYR CONTROL 10ML LL (SYRINGE) ×2 IMPLANT
TAPE CLOTH SURG 4X10 WHT LF (GAUZE/BANDAGES/DRESSINGS) ×1 IMPLANT
TOWEL OR 17X24 6PK STRL BLUE (TOWEL DISPOSABLE) ×2 IMPLANT
TOWEL OR 17X26 10 PK STRL BLUE (TOWEL DISPOSABLE) ×2 IMPLANT
WATER STERILE IRR 1000ML POUR (IV SOLUTION) ×2 IMPLANT

## 2011-04-23 NOTE — Anesthesia Procedure Notes (Signed)
Procedure Name: LMA Insertion Date/Time: 04/23/2011 7:58 AM Performed by: Glendora Score Pre-anesthesia Checklist: Patient identified, Emergency Drugs available, Suction available and Patient being monitored Patient Re-evaluated:Patient Re-evaluated prior to inductionOxygen Delivery Method: Circle System Utilized Preoxygenation: Pre-oxygenation with 100% oxygen Intubation Type: IV induction LMA: LMA inserted LMA Size: 5.0 Number of attempts: 1

## 2011-04-23 NOTE — Anesthesia Postprocedure Evaluation (Signed)
  Anesthesia Post-op Note  Patient: [name]  Procedure(s) Performed:  INSERTION PORT-A-CATH - Revision of  Porta-Cath  Patient Location: PACU  Anesthesia Type: General  Level of Consciousness: awake  Airway and Oxygen Therapy: Patient Spontanous Breathing  Post-op Pain: mild  Post-op Assessment: Post-op Vital signs reviewed  Post-op Vital Signs: stable  Complications: No apparent anesthesia complications

## 2011-04-23 NOTE — Transfer of Care (Signed)
Immediate Anesthesia Transfer of Care Note  Patient: [name]  Procedure(s) Performed:  INSERTION PORT-A-CATH - Revision of  Porta-Cath  Patient Location: PACU  Anesthesia Type: General  Level of Consciousness: awake, alert , oriented and patient cooperative  Airway & Oxygen Therapy: Patient Spontanous Breathing and Patient connected to face mask oxygen  Post-op Assessment: Report given to PACU RN  Post vital signs: Reviewed and stable  Complications: No apparent anesthesia complications

## 2011-04-23 NOTE — Anesthesia Preprocedure Evaluation (Addendum)
Anesthesia Evaluation  Patient identified by MRN, date of birth, ID band Patient awake    Reviewed: Allergy & Precautions, H&P , NPO status , Patient's Chart, lab work & pertinent test results, reviewed documented beta blocker date and time   Airway Mallampati: II TM Distance: >3 FB Neck ROM: Full    Dental  (+) Teeth Intact and Dental Advisory Given   Pulmonary sleep apnea and Continuous Positive Airway Pressure Ventilation , COPD Lung ca clear to auscultation  Pulmonary exam normal       Cardiovascular hypertension, Pt. on medications neg cardio ROS     Neuro/Psych  Neuromuscular disease    GI/Hepatic negative GI ROS, Neg liver ROS,   Endo/Other  Diabetes mellitus-Am cbg 160  Renal/GU negative Renal ROS     Musculoskeletal   Abdominal   Peds  Hematology negative hematology ROS (+)   Anesthesia Other Findings   Reproductive/Obstetrics negative OB ROS                         Anesthesia Physical Anesthesia Plan  ASA: III  Anesthesia Plan: General   Post-op Pain Management:    Induction: Intravenous  Airway Management Planned: LMA  Additional Equipment:   Intra-op Plan:   Post-operative Plan: Extubation in OR  Informed Consent: I have reviewed the patients History and Physical, chart, labs and discussed the procedure including the risks, benefits and alternatives for the proposed anesthesia with the patient or authorized representative who has indicated his/her understanding and acceptance.   Dental advisory given  Plan Discussed with: CRNA, Anesthesiologist and Surgeon  Anesthesia Plan Comments:        Anesthesia Quick Evaluation

## 2011-04-23 NOTE — Preoperative (Signed)
Beta Blockers   Reason not to administer Beta Blockers:Not Applicable 

## 2011-04-23 NOTE — H&P (Signed)
  had problems postoperatively with a hematoma. The Port-A-Cath is not being use recently. Chest x-ray shows that the Port-A-Cath is retracted out of the superior vena cava and is in the left innominate vein. This he has for more scheduled treatments. He is presently on Lovenox. We plan to revise the Port-A-Cath on November 13. We will stop his Lovenox 48 hours before. This  Current Outpatient Prescriptions   Medication  Sig  Dispense  Refill   .  enoxaparin (LOVENOX) 100 MG/ML SOLN  Inject 100 mg into the skin daily.     .  folic acid (FOLVITE) 1 MG tablet  Take 1 mg by mouth daily.     Marland Kitchen  glucosamine-chondroitin 500-400 MG tablet  Take 2 tablets by mouth daily.     Marland Kitchen  guaiFENesin (MUCINEX) 600 MG 12 hr tablet  Take 1,200 mg by mouth 2 (two) times daily.     .  metFORMIN (GLUCOPHAGE) 1000 MG tablet  Take 1,000 mg by mouth 2 (two) times daily with a meal.     .  simvastatin (ZOCOR) 40 MG tablet  Take 40 mg by mouth daily.     Marland Kitchen  zolpidem (AMBIEN) 5 MG tablet  Take 5 mg by mouth at bedtime as needed. Sleep      Review of Systems:unchanged  Physical Exam  Constitutional: He appears well-developed and well-nourished.  Cardiovascular: Normal rate, regular rhythm and normal heart sounds.  Pulmonary/Chest: Effort normal and breath sounds normal. No respiratory distress.   Diagnostic Tests: Chest x-ray that shows that the Port-A-Cath is retracted.  Impression: Stage IV non-small cell lung cancer abnormal position of Port-A-Cath  Plan: Revision of Port-A-Cath  Family HX: non contributory Social history unchanged. PMH uncjanged

## 2011-04-23 NOTE — Brief Op Note (Signed)
04/23/2011  8:36 AM  PATIENT:  [place] C[place]  77 y.o. male  PRE-OPERATIVE DIAGNOSIS:  ADENOCARCINOMA LUNG Malposition  Portacath   POST-OPERATIVE DIAGNOSIS:   Same PROCEDURE:  Procedure(s):Revision of portacath INSERTION PORT-A-CATH  SURGEON:  Surgeon(s): D Karle Plumber, MD PHYSICIAN ASSISTANT: none  ASSISTANTS: none  ANESTHESIA:   general  EBL:   10cc  BLOOD ADMINISTERED:none  DRAINS: portacath MRI   LOCAL MEDICATIONS USED:  NONEnone   SPECIMEN:  No Specimen  DISPOSITION OF SPECIMEN:  N/A  COUNTS:  YES  TOURNIQUET:DICTATION: .Other Dictation: Dictation Number [id number]  PLAN OF CARE: Discharge to home after PACU  PATIENT DISPOSITION:  PACU - hemodynamically stable.   Delay start of Pharmacological VTE agent (>24hrs) due to surgical blood loss or risk of bleeding:  YES

## 2011-04-24 ENCOUNTER — Other Ambulatory Visit: Payer: Self-pay | Admitting: Hematology & Oncology

## 2011-04-24 DIAGNOSIS — C3490 Malignant neoplasm of unspecified part of unspecified bronchus or lung: Secondary | ICD-10-CM

## 2011-04-24 NOTE — Op Note (Signed)
[name], [name] NO.:  [id number]  MEDICAL RECORD NO.:  [id number]  LOCATION:  [facility]                         FACILITY:  [facility]  PHYSICIAN:  Ines Bloomer, M.D. DATE OF BIRTH:  1925/02/01  DATE OF PROCEDURE:  04/23/2011 DATE OF DISCHARGE:                              OPERATIVE REPORT   PREOPERATIVE DIAGNOSIS:  Malpositioned left subclavian Port-A-Cath.  POSTOPERATIVE DIAGNOSIS:  Malpositioned left subclavian Port-A-Cath.  OPERATION PERFORMED:  Revision.  SURGEON:  Ines Bloomer, MD  ANESTHESIA:  General anesthesia with LMA.  DESCRIPTION OF PROCEDURE:  After anesthesia with LMA, the patient was prepped and draped in usual sterile manner.  The previous incision was made over the Port-A-Cath and dissection was carried down to the Port-A- Cath.  This Port-A-Cath had retracted, so we dissected out the Port-A- Cath reservoir and divided it since we had to put in a different type of reservoir.  After we divided the tubing, we put a guidewire through the tubing and able to guide the guidewire to the right atrium and then pulled out the old Port-A-Cath leaving the guidewire in place.  We then premeasured another Port-A-Cath tubing, cut it, and placed it over the guidewire and then removed the guidewire and placed the tubing at the right atrial SVC junction.  We then measured it appropriately and added the locking nut to the tubing and connected the tubing to the reservoir and placed the reservoir in the pocket.  The reservoir was sutured in place with 2-0 silk.  The wounds were closed with 3-0 Vicryl and interrupted nylon.  Dermabond was also added.  The patient was returned to the recovery room in stable condition.  The Port-A-Cath withdrew easily and flushed easily with heparin.     Ines Bloomer, M.D.     DPB/MEDQ  D:  04/23/2011  T:  04/23/2011  Job:  [id number]

## 2011-04-25 ENCOUNTER — Ambulatory Visit (HOSPITAL_BASED_OUTPATIENT_CLINIC_OR_DEPARTMENT_OTHER): Payer: Medicare Other

## 2011-04-25 ENCOUNTER — Encounter (HOSPITAL_COMMUNITY): Payer: Self-pay | Admitting: Thoracic Surgery

## 2011-04-25 ENCOUNTER — Other Ambulatory Visit: Payer: Self-pay | Admitting: Hematology & Oncology

## 2011-04-25 VITALS — BP 118/72 | HR 114 | Temp 97.8°F

## 2011-04-25 DIAGNOSIS — Z5111 Encounter for antineoplastic chemotherapy: Secondary | ICD-10-CM

## 2011-04-25 DIAGNOSIS — C3410 Malignant neoplasm of upper lobe, unspecified bronchus or lung: Secondary | ICD-10-CM

## 2011-04-25 DIAGNOSIS — C3490 Malignant neoplasm of unspecified part of unspecified bronchus or lung: Secondary | ICD-10-CM

## 2011-04-25 MED ORDER — CYANOCOBALAMIN 1000 MCG/ML IJ SOLN
1000.0000 ug | Freq: Once | INTRAMUSCULAR | Status: AC
  Administered 2011-04-25: 1000 ug via INTRAMUSCULAR

## 2011-04-25 MED ORDER — SODIUM CHLORIDE 0.9 % IV SOLN
500.0000 mg/m2 | Freq: Once | INTRAVENOUS | Status: AC
  Administered 2011-04-25: 1100 mg via INTRAVENOUS
  Filled 2011-04-25: qty 44

## 2011-04-25 MED ORDER — ONDANSETRON 16 MG/50ML IVPB (CHCC)
16.0000 mg | Freq: Once | INTRAVENOUS | Status: AC
  Administered 2011-04-25: 16 mg via INTRAVENOUS
  Filled 2011-04-25: qty 16

## 2011-04-25 MED ORDER — SODIUM CHLORIDE 0.9 % IJ SOLN
10.0000 mL | INTRAMUSCULAR | Status: DC | PRN
  Administered 2011-04-25: 10 mL
  Filled 2011-04-25: qty 10

## 2011-04-25 MED ORDER — DEXAMETHASONE SODIUM PHOSPHATE 4 MG/ML IJ SOLN
20.0000 mg | Freq: Once | INTRAMUSCULAR | Status: AC
  Administered 2011-04-25: 20 mg via INTRAVENOUS

## 2011-04-25 MED ORDER — HEPARIN SOD (PORK) LOCK FLUSH 100 UNIT/ML IV SOLN
500.0000 [IU] | Freq: Once | INTRAVENOUS | Status: AC | PRN
  Administered 2011-04-25: 500 [IU]
  Filled 2011-04-25: qty 5

## 2011-04-25 MED ORDER — SODIUM CHLORIDE 0.9 % IV SOLN
Freq: Once | INTRAVENOUS | Status: AC
  Administered 2011-04-25: 12:00:00 via INTRAVENOUS

## 2011-04-25 MED ORDER — DEXAMETHASONE 4 MG PO TABS: ORAL_TABLET | ORAL | Status: DC

## 2011-04-25 MED ORDER — INFLUENZA VIRUS VACC SPLIT PF IM SUSP: 0.5000 mL | INTRAMUSCULAR | Status: DC

## 2011-04-25 MED ORDER — SODIUM CHLORIDE 0.9 % IV SOLN
500.0000 mg | Freq: Once | INTRAVENOUS | Status: AC
  Administered 2011-04-25: 500 mg via INTRAVENOUS
  Filled 2011-04-25: qty 50

## 2011-04-25 MED ORDER — ONDANSETRON HCL 8 MG PO TABS: ORAL_TABLET | ORAL | Status: DC

## 2011-04-25 MED ORDER — PROCHLORPERAZINE MALEATE 10 MG PO TABS: 10.0000 mg | ORAL_TABLET | Freq: Four times a day (QID) | ORAL | Status: DC | PRN

## 2011-04-26 ENCOUNTER — Encounter: Payer: Self-pay | Admitting: Thoracic Surgery

## 2011-04-30 ENCOUNTER — Ambulatory Visit (INDEPENDENT_AMBULATORY_CARE_PROVIDER_SITE_OTHER): Payer: Self-pay | Admitting: Thoracic Surgery

## 2011-04-30 ENCOUNTER — Encounter: Payer: Self-pay | Admitting: Thoracic Surgery

## 2011-04-30 VITALS — BP 119/79 | HR 78 | Resp 16 | Ht 71.0 in | Wt 220.0 lb

## 2011-04-30 DIAGNOSIS — C3490 Malignant neoplasm of unspecified part of unspecified bronchus or lung: Secondary | ICD-10-CM

## 2011-04-30 NOTE — Progress Notes (Signed)
HPI patient returns for followup of his Port-A-Cath today. We removed the sutures. The Port-A-Cath is functioning well. It is healing well.   Current Outpatient Prescriptions  Medication Sig Dispense Refill  . cetirizine (ZYRTEC) 10 MG tablet Take 10 mg by mouth every morning.        Marland Kitchen dexamethasone (DECADRON) 4 MG tablet Take 1 tab two times a day the day before Alimta chemo. Take 2 tabs two times a day starting the day after chemo for 3 days.  30 tablet  1  . folic acid (FOLVITE) 1 MG tablet Take 1 mg by mouth every morning.       . fondaparinux (ARIXTRA) 10 MG/0.8ML SOLN Inject 10 mg into the skin at bedtime.        Marland Kitchen glimepiride (AMARYL) 2 MG tablet Take 2 mg by mouth daily as needed. Take when blood sugar is > 150       . glucosamine-chondroitin 500-400 MG tablet Take 2 tablets by mouth every morning.       . metFORMIN (GLUCOPHAGE) 1000 MG tablet Take 1,000 mg by mouth 2 (two) times daily with a meal.       . ondansetron (ZOFRAN) 8 MG tablet Take 1 tab two times a day starting the day after chemo for 3 days. Then take 1 tab two times a day as needed for nausea or vomiting.   30 tablet  1  . prochlorperazine (COMPAZINE) 10 MG tablet Take 1 tablet (10 mg total) by mouth every 6 (six) hours as needed (Nausea or vomiting).  30 tablet  1  . simvastatin (ZOCOR) 40 MG tablet Take 40 mg by mouth at bedtime.       Marland Kitchen zolpidem (AMBIEN) 10 MG tablet Take 10 mg by mouth at bedtime as needed. For sleep          Review of Systems: Unchanged   Physical Exam lungs are clear auscultation well-healed portacath  Diagnostic Tests: None  Impression: Stage IV non-small cell lung   Plan: Status post Port-A-Cath revision return when necessary

## 2011-05-15 ENCOUNTER — Other Ambulatory Visit: Payer: Self-pay | Admitting: Hematology & Oncology

## 2011-05-15 ENCOUNTER — Ambulatory Visit: Payer: Medicare Other

## 2011-05-15 ENCOUNTER — Ambulatory Visit (HOSPITAL_BASED_OUTPATIENT_CLINIC_OR_DEPARTMENT_OTHER): Payer: Medicare Other

## 2011-05-15 ENCOUNTER — Other Ambulatory Visit: Payer: Medicare Other | Admitting: Lab

## 2011-05-15 ENCOUNTER — Other Ambulatory Visit (HOSPITAL_BASED_OUTPATIENT_CLINIC_OR_DEPARTMENT_OTHER): Payer: Medicare Other | Admitting: Lab

## 2011-05-15 ENCOUNTER — Ambulatory Visit: Payer: Medicare Other | Admitting: Hematology & Oncology

## 2011-05-15 VITALS — BP 121/71 | HR 78 | Temp 97.4°F

## 2011-05-15 DIAGNOSIS — I4891 Unspecified atrial fibrillation: Secondary | ICD-10-CM

## 2011-05-15 DIAGNOSIS — Z5111 Encounter for antineoplastic chemotherapy: Secondary | ICD-10-CM

## 2011-05-15 DIAGNOSIS — C3490 Malignant neoplasm of unspecified part of unspecified bronchus or lung: Secondary | ICD-10-CM

## 2011-05-15 DIAGNOSIS — I82B19 Acute embolism and thrombosis of unspecified subclavian vein: Secondary | ICD-10-CM

## 2011-05-15 DIAGNOSIS — C3410 Malignant neoplasm of upper lobe, unspecified bronchus or lung: Secondary | ICD-10-CM

## 2011-05-15 DIAGNOSIS — R1310 Dysphagia, unspecified: Secondary | ICD-10-CM

## 2011-05-15 LAB — CBC WITH DIFFERENTIAL (CANCER CENTER ONLY)
BASO#: 0 10*3/uL (ref 0.0–0.2)
BASO%: 0.4 % (ref 0.0–2.0)
EOS%: 1.2 % (ref 0.0–7.0)
HCT: 38.2 % — ABNORMAL LOW (ref 38.7–49.9)
HGB: 13 g/dL (ref 13.0–17.1)
LYMPH%: 41.1 % (ref 14.0–48.0)
MCH: 31.6 pg (ref 28.0–33.4)
MCHC: 34 g/dL (ref 32.0–35.9)
MONO%: 16.6 % — ABNORMAL HIGH (ref 0.0–13.0)
NEUT%: 40.7 % (ref 40.0–80.0)
RDW: 12.6 % (ref 11.1–15.7)

## 2011-05-15 LAB — COMPREHENSIVE METABOLIC PANEL
Albumin: 3.9 g/dL (ref 3.5–5.2)
Alkaline Phosphatase: 87 U/L (ref 39–117)
BUN: 12 mg/dL (ref 6–23)
Glucose, Bld: 177 mg/dL — ABNORMAL HIGH (ref 70–99)
Potassium: 4.3 mEq/L (ref 3.5–5.3)
Total Bilirubin: 0.3 mg/dL (ref 0.3–1.2)

## 2011-05-15 MED ORDER — HEPARIN SOD (PORK) LOCK FLUSH 100 UNIT/ML IV SOLN
500.0000 [IU] | Freq: Once | INTRAVENOUS | Status: AC | PRN
  Administered 2011-05-15: 500 [IU]
  Filled 2011-05-15: qty 5

## 2011-05-15 MED ORDER — ONDANSETRON 16 MG/50ML IVPB (CHCC)
16.0000 mg | Freq: Once | INTRAVENOUS | Status: AC
  Administered 2011-05-15: 16 mg via INTRAVENOUS
  Filled 2011-05-15: qty 16

## 2011-05-15 MED ORDER — SODIUM CHLORIDE 0.9 % IV SOLN
500.0000 mg/m2 | Freq: Once | INTRAVENOUS | Status: AC
  Administered 2011-05-15: 1100 mg via INTRAVENOUS
  Filled 2011-05-15: qty 44

## 2011-05-15 MED ORDER — SODIUM CHLORIDE 0.9 % IJ SOLN
10.0000 mL | INTRAMUSCULAR | Status: DC | PRN
  Administered 2011-05-15: 10 mL
  Filled 2011-05-15: qty 10

## 2011-05-15 MED ORDER — SODIUM CHLORIDE 0.9 % IV SOLN
Freq: Once | INTRAVENOUS | Status: AC
  Administered 2011-05-15: 12:00:00 via INTRAVENOUS

## 2011-05-15 MED ORDER — SODIUM CHLORIDE 0.9 % IV SOLN
500.0000 mg | Freq: Once | INTRAVENOUS | Status: AC
  Administered 2011-05-15: 500 mg via INTRAVENOUS
  Filled 2011-05-15: qty 50

## 2011-05-15 MED ORDER — DEXAMETHASONE SODIUM PHOSPHATE 4 MG/ML IJ SOLN
20.0000 mg | Freq: Once | INTRAMUSCULAR | Status: AC
  Administered 2011-05-15: 20 mg via INTRAVENOUS

## 2011-05-23 ENCOUNTER — Encounter: Payer: Self-pay | Admitting: *Deleted

## 2011-06-06 ENCOUNTER — Ambulatory Visit: Payer: Medicare Other

## 2011-06-06 ENCOUNTER — Ambulatory Visit (HOSPITAL_BASED_OUTPATIENT_CLINIC_OR_DEPARTMENT_OTHER): Payer: Medicare Other | Admitting: Hematology & Oncology

## 2011-06-06 ENCOUNTER — Ambulatory Visit (HOSPITAL_BASED_OUTPATIENT_CLINIC_OR_DEPARTMENT_OTHER): Payer: Medicare Other

## 2011-06-06 ENCOUNTER — Other Ambulatory Visit: Payer: Self-pay | Admitting: Hematology & Oncology

## 2011-06-06 ENCOUNTER — Other Ambulatory Visit (HOSPITAL_BASED_OUTPATIENT_CLINIC_OR_DEPARTMENT_OTHER): Payer: Medicare Other | Admitting: Lab

## 2011-06-06 VITALS — BP 112/75 | HR 72 | Temp 97.7°F | Ht 71.0 in | Wt 211.0 lb

## 2011-06-06 DIAGNOSIS — C3410 Malignant neoplasm of upper lobe, unspecified bronchus or lung: Secondary | ICD-10-CM

## 2011-06-06 DIAGNOSIS — R32 Unspecified urinary incontinence: Secondary | ICD-10-CM

## 2011-06-06 DIAGNOSIS — C3490 Malignant neoplasm of unspecified part of unspecified bronchus or lung: Secondary | ICD-10-CM

## 2011-06-06 DIAGNOSIS — Z5111 Encounter for antineoplastic chemotherapy: Secondary | ICD-10-CM

## 2011-06-06 LAB — CBC WITH DIFFERENTIAL (CANCER CENTER ONLY)
BASO#: 0 10*3/uL (ref 0.0–0.2)
Eosinophils Absolute: 0.1 10*3/uL (ref 0.0–0.5)
HGB: 12.4 g/dL — ABNORMAL LOW (ref 13.0–17.1)
MCH: 31.5 pg (ref 28.0–33.4)
MONO#: 0.7 10*3/uL (ref 0.1–0.9)
MONO%: 21.6 % — ABNORMAL HIGH (ref 0.0–13.0)
NEUT#: 1.5 10*3/uL (ref 1.5–6.5)
RBC: 3.94 10*6/uL — ABNORMAL LOW (ref 4.20–5.70)
WBC: 3.3 10*3/uL — ABNORMAL LOW (ref 4.0–10.0)

## 2011-06-06 LAB — BASIC METABOLIC PANEL
BUN: 18 mg/dL (ref 6–23)
Chloride: 101 mEq/L (ref 96–112)
Glucose, Bld: 147 mg/dL — ABNORMAL HIGH (ref 70–99)
Potassium: 4.1 mEq/L (ref 3.5–5.3)
Sodium: 139 mEq/L (ref 135–145)

## 2011-06-06 MED ORDER — CYANOCOBALAMIN 1000 MCG/ML IJ SOLN: 1000.0000 ug | Freq: Once | INTRAMUSCULAR | Status: DC

## 2011-06-06 MED ORDER — CARBOPLATIN CHEMO INJECTION 600 MG/60ML
500.0000 mg | Freq: Once | INTRAVENOUS | Status: AC
  Administered 2011-06-06: 500 mg via INTRAVENOUS
  Filled 2011-06-06: qty 50

## 2011-06-06 MED ORDER — SODIUM CHLORIDE 0.9 % IJ SOLN
10.0000 mL | INTRAMUSCULAR | Status: DC | PRN
  Administered 2011-06-06: 10 mL
  Filled 2011-06-06: qty 10

## 2011-06-06 MED ORDER — SODIUM CHLORIDE 0.9 % IV SOLN
Freq: Once | INTRAVENOUS | Status: AC
  Administered 2011-06-06: 12:00:00 via INTRAVENOUS

## 2011-06-06 MED ORDER — DEXAMETHASONE SODIUM PHOSPHATE 4 MG/ML IJ SOLN
20.0000 mg | Freq: Once | INTRAMUSCULAR | Status: AC
  Administered 2011-06-06: 20 mg via INTRAVENOUS

## 2011-06-06 MED ORDER — SODIUM CHLORIDE 0.9 % IV SOLN
500.0000 mg/m2 | Freq: Once | INTRAVENOUS | Status: AC
  Administered 2011-06-06: 1100 mg via INTRAVENOUS
  Filled 2011-06-06: qty 44

## 2011-06-06 MED ORDER — ONDANSETRON 16 MG/50ML IVPB (CHCC)
16.0000 mg | Freq: Once | INTRAVENOUS | Status: AC
  Administered 2011-06-06: 16 mg via INTRAVENOUS
  Filled 2011-06-06: qty 16

## 2011-06-06 MED ORDER — SOLIFENACIN SUCCINATE 5 MG PO TABS: 5.0000 mg | ORAL_TABLET | Freq: Every day | ORAL | Status: DC

## 2011-06-06 MED ORDER — HEPARIN SOD (PORK) LOCK FLUSH 100 UNIT/ML IV SOLN
500.0000 [IU] | Freq: Once | INTRAVENOUS | Status: AC | PRN
  Administered 2011-06-06: 500 [IU]
  Filled 2011-06-06: qty 5

## 2011-06-06 NOTE — Progress Notes (Signed)
CC:   Ines Bloomer, M.D. Larina Earthly, M.D.  DIAGNOSES: 1. 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 2. 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  CURRENT THERAPY: 1. The patient is status post 2 cycles of chemotherapy with     carboplatin/Alimta. 2. Lovenox 100 mg subcu b.i.d.  INTERIM HISTORY:  Mr. Esper comes in for his followup.  He tolerated his chemotherapy well.  I am giving him full-dose chemotherapy after he received chemoradiation therapy.  He had a very nice response to chemoradiation therapy.  His PET scan basically normalized.  He still has a right paratracheal mass that on PET scan was not active.  Again, I felt that there would be some benefit for full-dose systemic therapy to help prevent distant recurrence.  He is having some problems with bladder leakage.  We will try him on some VESIcare (5 mg p.o. daily).  He has not had any problems with cough.  There is no hemoptysis.  His appetite is great.  He enjoyed Thanksgiving and Christmas.  He has been eating well.  He has had no headache.  There has been no double vision or blurred vision.  He has had no dysphasia or odynophagia.  Overall, his performance status is ECOG 1.  PHYSICAL EXAM:  General:  This is a well-developed well-nourished white gentleman in no obvious distress.  Vital Signs:  Temperature of 97.7, pulse 72, respiratory rate 16, blood pressure 112/75.  Weight is 201. Head/Neck:  Exam shows a normocephalic, atraumatic skull.  There are no ocular or oral lesions.  There are no palpable cervical or supraclavicular lymph nodes.  Lungs:  Clear to percussion and auscultation bilaterally.  Cardiac:  Regular rate and rhythm with a normal S1 and S2.  There are no murmurs, rubs or bruits.  Abdomen:  Soft with good bowel sounds.  There is no palpable abdominal mass.  There is no palpable hepatosplenomegaly.  Rectal:  Exam shows a large hemorrhoid. It is not thrombosed.  There is  no bleeding in the perirectal area. Extremities:  No clubbing, cyanosis or edema.  Neurologic:  Exam shows no focal neurological deficits.  LABORATORY STUDIES:  White cell count is 3.3, hemoglobin 12.4, hematocrit 36.3, platelet count 213.  IMPRESSION:  Mr. Grenda is an 77 year old gentleman who is in incredible shape.  He has really done very, very nicely.  He had a tough time at the end of chemoradiation therapy, but he has recovered from this quite well.  We will go ahead and plan for his 3rd cycle of treatment today.  We will then get him back in 3 weeks for his last cycle.  Hopefully, the VESIcare will help with his urinary incontinence.  His daughter says this happens mostly when his blood sugars are high.  We will get Mr. Bolls back to see [place] in another 3 months.    ______________________________ Josph Macho, M.D. PRE/MEDQ  D:  06/06/2011  T:  06/06/2011  Job:  [id number]

## 2011-06-06 NOTE — Patient Instructions (Signed)
[facility] Discharge Instructions for Patients Receiving Chemotherapy    To help prevent nausea and vomiting after your treatment, we encourage you to take your nausea medication    If you develop nausea and vomiting that is not controlled by your nausea medication, call the clinic. If it is after clinic hours your family physician or the after hours number for the clinic or go to the Emergency Department.   BELOW ARE SYMPTOMS THAT SHOULD BE REPORTED IMMEDIATELY:  *FEVER GREATER THAN 100.5 F  *CHILLS WITH OR WITHOUT FEVER  NAUSEA AND VOMITING THAT IS NOT CONTROLLED WITH YOUR NAUSEA MEDICATION  *UNUSUAL SHORTNESS OF BREATH  *UNUSUAL BRUISING OR BLEEDING  TENDERNESS IN MOUTH AND THROAT WITH OR WITHOUT PRESENCE OF ULCERS  *URINARY PROBLEMS  *BOWEL PROBLEMS  UNUSUAL RASH Items with * indicate a potential emergency and should be followed up as soon as possible.  One of the nurses will contact you 24 hours after your treatment. Please let the nurse know about any problems that you may have experienced. Feel free to call the clinic you have any questions or concerns. The clinic phone number is [phone].   I have been informed and understand all the instructions given to me. I know to contact the clinic, my physician, or go to the Emergency Department if any problems should occur. I do not have any questions at this time, but understand that I may call the clinic during office hours   should I have any questions or need assistance in obtaining follow up care.    __________________________________________  _____________  __________ Signature of Patient or Authorized Representative            Date                   Time    __________________________________________ Nurse's Signature

## 2011-06-06 NOTE — Progress Notes (Signed)
This office note has been dictated.

## 2011-06-27 ENCOUNTER — Other Ambulatory Visit (HOSPITAL_BASED_OUTPATIENT_CLINIC_OR_DEPARTMENT_OTHER): Payer: Medicare Other | Admitting: Lab

## 2011-06-27 ENCOUNTER — Ambulatory Visit (HOSPITAL_BASED_OUTPATIENT_CLINIC_OR_DEPARTMENT_OTHER): Payer: Medicare Other

## 2011-06-27 ENCOUNTER — Ambulatory Visit (HOSPITAL_BASED_OUTPATIENT_CLINIC_OR_DEPARTMENT_OTHER): Payer: Medicare Other | Admitting: Hematology & Oncology

## 2011-06-27 VITALS — BP 112/68 | HR 73 | Temp 96.8°F | Ht 71.0 in | Wt 211.0 lb

## 2011-06-27 DIAGNOSIS — I82B19 Acute embolism and thrombosis of unspecified subclavian vein: Secondary | ICD-10-CM

## 2011-06-27 DIAGNOSIS — C3410 Malignant neoplasm of upper lobe, unspecified bronchus or lung: Secondary | ICD-10-CM

## 2011-06-27 DIAGNOSIS — I82409 Acute embolism and thrombosis of unspecified deep veins of unspecified lower extremity: Secondary | ICD-10-CM

## 2011-06-27 DIAGNOSIS — Z5111 Encounter for antineoplastic chemotherapy: Secondary | ICD-10-CM

## 2011-06-27 DIAGNOSIS — C3490 Malignant neoplasm of unspecified part of unspecified bronchus or lung: Secondary | ICD-10-CM

## 2011-06-27 LAB — CBC WITH DIFFERENTIAL (CANCER CENTER ONLY)
BASO%: 0.3 % (ref 0.0–2.0)
Eosinophils Absolute: 0.1 10*3/uL (ref 0.0–0.5)
HCT: 32.5 % — ABNORMAL LOW (ref 38.7–49.9)
LYMPH%: 30.6 % (ref 14.0–48.0)
MCH: 31.7 pg (ref 28.0–33.4)
MCV: 92 fL (ref 82–98)
MONO#: 0.8 10*3/uL (ref 0.1–0.9)
MONO%: 26.1 % — ABNORMAL HIGH (ref 0.0–13.0)
NEUT%: 40.1 % (ref 40.0–80.0)
RDW: 16.5 % — ABNORMAL HIGH (ref 11.1–15.7)
WBC: 3.1 10*3/uL — ABNORMAL LOW (ref 4.0–10.0)

## 2011-06-27 LAB — BASIC METABOLIC PANEL
CO2: 28 mEq/L (ref 19–32)
Calcium: 8.7 mg/dL (ref 8.4–10.5)
Creatinine, Ser: 1.1 mg/dL (ref 0.50–1.35)
Glucose, Bld: 128 mg/dL — ABNORMAL HIGH (ref 70–99)

## 2011-06-27 MED ORDER — SODIUM CHLORIDE 0.9 % IV SOLN
500.0000 mg/m2 | Freq: Once | INTRAVENOUS | Status: AC
  Administered 2011-06-27: 1100 mg via INTRAVENOUS
  Filled 2011-06-27: qty 44

## 2011-06-27 MED ORDER — SODIUM CHLORIDE 0.9 % IV SOLN: Freq: Once | INTRAVENOUS | Status: DC

## 2011-06-27 MED ORDER — SODIUM CHLORIDE 0.9 % IJ SOLN
10.0000 mL | INTRAMUSCULAR | Status: DC | PRN
  Administered 2011-06-27: 10 mL
  Filled 2011-06-27: qty 10

## 2011-06-27 MED ORDER — HEPARIN SOD (PORK) LOCK FLUSH 100 UNIT/ML IV SOLN
500.0000 [IU] | Freq: Once | INTRAVENOUS | Status: AC | PRN
  Administered 2011-06-27: 500 [IU]
  Filled 2011-06-27: qty 5

## 2011-06-27 MED ORDER — ONDANSETRON 16 MG/50ML IVPB (CHCC)
16.0000 mg | Freq: Once | INTRAVENOUS | Status: AC
  Administered 2011-06-27: 16 mg via INTRAVENOUS
  Filled 2011-06-27: qty 16

## 2011-06-27 MED ORDER — SODIUM CHLORIDE 0.9 % IV SOLN
500.0000 mg | Freq: Once | INTRAVENOUS | Status: AC
  Administered 2011-06-27: 500 mg via INTRAVENOUS
  Filled 2011-06-27: qty 50

## 2011-06-27 MED ORDER — CYANOCOBALAMIN 1000 MCG/ML IJ SOLN
1000.0000 ug | Freq: Once | INTRAMUSCULAR | Status: AC
  Administered 2011-06-27: 1000 ug via INTRAMUSCULAR

## 2011-06-27 MED ORDER — DEXAMETHASONE SODIUM PHOSPHATE 4 MG/ML IJ SOLN
20.0000 mg | Freq: Once | INTRAMUSCULAR | Status: AC
  Administered 2011-06-27: 20 mg via INTRAVENOUS

## 2011-06-27 NOTE — Progress Notes (Signed)
This office note has been dictated.

## 2011-06-27 NOTE — Progress Notes (Signed)
CC:   [name], M.D. Larina Earthly, M.D.  DIAGNOSES: 1. 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 2. 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  CURRENT THERAPY: 1. Patient is status post 3 cycles of carboplatin/Alimta. 2. Arixtra 10 mg subcu daily.  INTERIM HISTORY:  [name] comes in for followup.  Unfortunately, his poor wife, whom I take care of, is at [facility].  She has problems with recurrent GI bleeding.  She is on chronic Coumadin because of mechanical heart valves.  There is no way to stop her bleeding.  She has been bleeding quite a bit.  She is requiring blood transfusions.  [name] has done well with his chemotherapy.  He has had no real problems with nausea or vomiting.  His appetite has been good.  He has had no fever.  He has had no bleeding with the Arixtra.  He has not noticed any arm swelling with the right arm.  He has had no leg swelling.  PHYSICAL EXAM:  General:  This is a well-developed, well-nourished white gentleman in no obvious distress.  Vital Signs:  Temperature 96.8, pulse 73, respiratory rate 16, blood pressure 112/68.  Weight is 211. Head/Neck:  Exam shows a normocephalic, atraumatic skull.  There are no ocular or oral lesions.  No palpable cervical or supraclavicular lymph nodes.  Lungs:  Clear bilaterally.  Cardiac:  Regular rate and rhythm with a normal S1, S2.  No murmurs, rubs or bruits.  Abdomen:  Soft with good bowel sounds.  There is no palpable abdominal mass.  There is no fluid wave.  There is no palpable hepatosplenomegaly.  Back:  No tenderness over the spine, ribs, or hips.  Extremities:  No clubbing, cyanosis or edema.  He has good pulses in his distal extremities.  Skin: Exam shows no rashes, ecchymosis or petechiae.  Neurologic:  Exam shows no focal neurological deficits.  LABORATORY STUDIES:  White cell count is 3.1, hemoglobin 11.2, hematocrit 33.5, platelet count 179.  IMPRESSION:  [name] is an 78 year old gentleman who is very "stout." He has done very well with treatment.  He has responded very nicely.  He will finish up his treatment for his locally advanced non-small cell lung cancer today.  We will go ahead and plan to get a PET scan on him in about 4 weeks or so, then we will see him back afterwards.  He is on Arixtra right now.  I think that once his PET scan comes back and everything looks okay, we can probably switch him over to Xarelto. I believe that Xarelto should be adequate for anticoagulation for him.  He presented back in July of 2012 with his DVT.  He also had his lung cancer diagnosis at the same time.  I suspect that we could probably get him to the 1 year mark and then consider switching him over to aspirin.    ______________________________ [name], M.D. PRE/MEDQ  D:  06/27/2011  T:  06/27/2011  Job:  [id number]

## 2011-08-07 ENCOUNTER — Ambulatory Visit (HOSPITAL_BASED_OUTPATIENT_CLINIC_OR_DEPARTMENT_OTHER)
Admission: RE | Admit: 2011-08-07 | Discharge: 2011-08-07 | Disposition: A | Discharge status: Home / Self Care | Payer: Medicare Other | Source: Ambulatory Visit | Attending: Hematology & Oncology | Admitting: Hematology & Oncology

## 2011-08-07 DIAGNOSIS — C3490 Malignant neoplasm of unspecified part of unspecified bronchus or lung: Secondary | ICD-10-CM | POA: Insufficient documentation

## 2011-08-07 DIAGNOSIS — R599 Enlarged lymph nodes, unspecified: Secondary | ICD-10-CM | POA: Insufficient documentation

## 2011-08-07 MED ORDER — FLUDEOXYGLUCOSE F - 18 (FDG) INJECTION
15.3000 | Freq: Once | INTRAVENOUS | Status: AC | PRN
  Administered 2011-08-07: 15.3 via INTRAVENOUS

## 2011-08-13 ENCOUNTER — Ambulatory Visit (HOSPITAL_BASED_OUTPATIENT_CLINIC_OR_DEPARTMENT_OTHER): Payer: Medicare Other | Admitting: Hematology & Oncology

## 2011-08-13 ENCOUNTER — Other Ambulatory Visit (HOSPITAL_BASED_OUTPATIENT_CLINIC_OR_DEPARTMENT_OTHER): Payer: Medicare Other | Admitting: Lab

## 2011-08-13 VITALS — BP 105/67 | HR 60 | Temp 97.0°F | Ht 71.0 in | Wt 209.0 lb

## 2011-08-13 DIAGNOSIS — I82409 Acute embolism and thrombosis of unspecified deep veins of unspecified lower extremity: Secondary | ICD-10-CM

## 2011-08-13 DIAGNOSIS — C3490 Malignant neoplasm of unspecified part of unspecified bronchus or lung: Secondary | ICD-10-CM

## 2011-08-13 DIAGNOSIS — C3410 Malignant neoplasm of upper lobe, unspecified bronchus or lung: Secondary | ICD-10-CM

## 2011-08-13 LAB — COMPREHENSIVE METABOLIC PANEL
AST: 16 U/L (ref 0–37)
Albumin: 3.7 g/dL (ref 3.5–5.2)
Alkaline Phosphatase: 84 U/L (ref 39–117)
Calcium: 9.2 mg/dL (ref 8.4–10.5)
Chloride: 99 mEq/L (ref 96–112)
Glucose, Bld: 173 mg/dL — ABNORMAL HIGH (ref 70–99)
Potassium: 4 mEq/L (ref 3.5–5.3)
Sodium: 137 mEq/L (ref 135–145)
Total Protein: 6.6 g/dL (ref 6.0–8.3)

## 2011-08-13 LAB — CBC WITH DIFFERENTIAL (CANCER CENTER ONLY)
BASO#: 0 10*3/uL (ref 0.0–0.2)
EOS%: 3.9 % (ref 0.0–7.0)
Eosinophils Absolute: 0.2 10*3/uL (ref 0.0–0.5)
HGB: 11.2 g/dL — ABNORMAL LOW (ref 13.0–17.1)
LYMPH#: 0.7 10*3/uL — ABNORMAL LOW (ref 0.9–3.3)
MCH: 31.1 pg (ref 28.0–33.4)
MONO%: 14.3 % — ABNORMAL HIGH (ref 0.0–13.0)
NEUT#: 3.7 10*3/uL (ref 1.5–6.5)
Platelets: 218 10*3/uL (ref 145–400)
RBC: 3.6 10*6/uL — ABNORMAL LOW (ref 4.20–5.70)

## 2011-08-13 MED ORDER — FONDAPARINUX SODIUM 10 MG/0.8ML ~~LOC~~ SOLN: 10.0000 mg | Freq: Every day | SUBCUTANEOUS | Status: DC

## 2011-08-13 NOTE — Progress Notes (Signed)
CC:   Larina Earthly, M.D. Ines Bloomer, M.D.  DIAGNOSES: 1. 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 2. 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  CURRENT THERAPY:  Arixtra 10 mg subcu daily.  INTERIM HISTORY:  Mr. Franzoni comes in for followup.  He is doing better. He is slowly recovering from his chemotherapy.  He completed his chemotherapy back in December 2012.  He is feeling better.  He still feels a little tired.  However, he feels that he is getting some strength back.  He is waiting for the warmer weather so he can be outside more.  We did go ahead and repeat a CT-PET scan on him.  This was done on the 27th.  This did show some mildly increased hypermetabolic activity within bilateral hilar nodes.  However, there is no growth of the hilar nodes.  There is no evidence of growth elsewhere within the mediastinum or chest.  There is no new evidence of extrathoracic disease.  He is doing well with the Arixtra.  There is no right arm swelling.  He has had no right arm pain.  His appetite is coming back slowly but surely.  He has had no nausea or vomiting.  There has been no cough.  He has had no headache.  He has had no change in bowel or bladder habits.  PHYSICAL EXAMINATION:  General:  This is a well-developed, well- nourished white gentleman in no obvious distress.  Vital Signs: Temperature 97, pulse 60, respiratory rate 18, blood pressure 105/67, weight is 209.  Head and Neck Exam:  Shows a normocephalic, atraumatic skull.  There are no ocular or oral lesions.  Lymph:  There are no palpable cervical or supraclavicular lymph nodes.  Lungs:  Clear bilaterally.  There are no rales, wheezes, or rhonchi.  Cardiac Exam: Regular rate and rhythm with a normal S1 and S2.  There are no murmurs, rubs, or bruits.  Abdominal Exam:  Soft with good bowel sounds.  There is no palpable abdominal mass.  There is no fluid wave.  There is no palpable  hepatosplenomegaly.  Extremities:  Show no clubbing, cyanosis, or edema.  Neurological Exam:  No focal neurological deficits.  LABORATORY STUDIES:  White cell count 5.4, hemoglobin 11.2, hematocrit 34.2, platelet count 218.  IMPRESSION:  Mr. Burget is an 78 year old gentleman.  He is a stout guy. He has stage IIIB-inoperable-adenocarcinoma of the lung.  He underwent chemoradiation therapy, followed by full-dose chemotherapy.  He initially presented with a DVT of the right subclavian vein.  It is hard to know what to make of the PET scan.  I think that we can just get him a CT scan.  If we see growth or new areas of changes on his CT scan, then we can get a PET scan.  I will plan for another CT scan in about 3 months.  We will have him come back in 6 weeks for a Port-A-Cath flush.    ______________________________ Josph Macho, M.D. PRE/MEDQ  D:  08/13/2011  T:  08/13/2011  Job:  [id number]

## 2011-08-13 NOTE — Progress Notes (Signed)
Addended by: Arlan Organ R on: 08/13/2011 12:13 PM   Modules accepted: Orders

## 2011-08-13 NOTE — Progress Notes (Signed)
This office note has been dictated.

## 2011-08-26 ENCOUNTER — Other Ambulatory Visit: Payer: Medicare Other | Admitting: Lab

## 2011-08-26 ENCOUNTER — Ambulatory Visit: Payer: Medicare Other | Admitting: Hematology & Oncology

## 2011-08-27 ENCOUNTER — Telehealth: Payer: Self-pay | Admitting: *Deleted

## 2011-08-27 DIAGNOSIS — C3490 Malignant neoplasm of unspecified part of unspecified bronchus or lung: Secondary | ICD-10-CM

## 2011-08-27 MED ORDER — MIRTAZAPINE 7.5 MG PO TABS: 7.5000 mg | ORAL_TABLET | Freq: Every day | ORAL | Status: DC

## 2011-08-27 NOTE — Telephone Encounter (Signed)
Pt's dtr called with concerns about her father's poor appetite, weakness, irritability, and appeared wt loss. She said that he tells her food smells bad to him. He saw his PCP who did labs that were "normal". [name] has read that pt's have taken other medications such as Remeron and Marinol that have helped with stimulating appetites. Upon reviewing with Dr Myna Hidalgo, he was agreeable to trying to Remeron 7.5 mg qhs. She was made aware of this and knows to call if he has any further problems or issues with the new medications. Rx sent to [facility] in [place] as requested.

## 2011-09-14 ENCOUNTER — Telehealth: Payer: Self-pay | Admitting: Oncology

## 2011-09-14 NOTE — Telephone Encounter (Signed)
On call: daughter Myriam Jacobson called as patient has coughed up small amount of blood mixed with mucous x 3 today, each time ~ size of quarter. No other bleeding, not any more short of breath than usual. Continues arixtra for DVT in upper extremity in June 2012, takes shot in evenings. Told daughter to hold arixtra today and tomorrow and to speak with Dr.Ennever's office on Mon. 4-8 to let them know how he is doing and for further instructions; if increased hemoptysis in meantime, to go to ED. Daughter comfortable with this plan.

## 2011-09-16 ENCOUNTER — Encounter (HOSPITAL_BASED_OUTPATIENT_CLINIC_OR_DEPARTMENT_OTHER): Payer: Self-pay

## 2011-09-16 ENCOUNTER — Emergency Department (INDEPENDENT_AMBULATORY_CARE_PROVIDER_SITE_OTHER): Payer: Medicare Other

## 2011-09-16 ENCOUNTER — Other Ambulatory Visit: Payer: Self-pay

## 2011-09-16 ENCOUNTER — Encounter: Payer: Self-pay | Admitting: *Deleted

## 2011-09-16 ENCOUNTER — Emergency Department (HOSPITAL_BASED_OUTPATIENT_CLINIC_OR_DEPARTMENT_OTHER)
Admission: EM | Admit: 2011-09-16 | Discharge: 2011-09-16 | Disposition: A | Discharge status: Home / Self Care | Payer: Medicare Other | Attending: Emergency Medicine | Admitting: Emergency Medicine

## 2011-09-16 DIAGNOSIS — R918 Other nonspecific abnormal finding of lung field: Secondary | ICD-10-CM

## 2011-09-16 DIAGNOSIS — R0602 Shortness of breath: Secondary | ICD-10-CM | POA: Insufficient documentation

## 2011-09-16 DIAGNOSIS — I319 Disease of pericardium, unspecified: Secondary | ICD-10-CM

## 2011-09-16 DIAGNOSIS — R042 Hemoptysis: Secondary | ICD-10-CM

## 2011-09-16 DIAGNOSIS — I10 Essential (primary) hypertension: Secondary | ICD-10-CM | POA: Insufficient documentation

## 2011-09-16 DIAGNOSIS — J189 Pneumonia, unspecified organism: Secondary | ICD-10-CM | POA: Insufficient documentation

## 2011-09-16 DIAGNOSIS — Z86718 Personal history of other venous thrombosis and embolism: Secondary | ICD-10-CM | POA: Insufficient documentation

## 2011-09-16 DIAGNOSIS — E119 Type 2 diabetes mellitus without complications: Secondary | ICD-10-CM | POA: Insufficient documentation

## 2011-09-16 DIAGNOSIS — J4489 Other specified chronic obstructive pulmonary disease: Secondary | ICD-10-CM

## 2011-09-16 DIAGNOSIS — Z85118 Personal history of other malignant neoplasm of bronchus and lung: Secondary | ICD-10-CM | POA: Insufficient documentation

## 2011-09-16 DIAGNOSIS — J449 Chronic obstructive pulmonary disease, unspecified: Secondary | ICD-10-CM

## 2011-09-16 DIAGNOSIS — Z794 Long term (current) use of insulin: Secondary | ICD-10-CM | POA: Insufficient documentation

## 2011-09-16 DIAGNOSIS — C3490 Malignant neoplasm of unspecified part of unspecified bronchus or lung: Secondary | ICD-10-CM

## 2011-09-16 LAB — BASIC METABOLIC PANEL
GFR calc non Af Amer: 75 mL/min — ABNORMAL LOW (ref >90)
Glucose, Bld: 144 mg/dL — ABNORMAL HIGH (ref 70–99)
Potassium: 3.8 mEq/L (ref 3.5–5.1)
Sodium: 139 mEq/L (ref 135–145)

## 2011-09-16 LAB — DIFFERENTIAL
Eosinophils Absolute: 0.1 10*3/uL (ref 0.0–0.7)
Lymphocytes Relative: 16 % (ref 12–46)
Lymphs Abs: 0.6 10*3/uL — ABNORMAL LOW (ref 0.7–4.0)
Neutrophils Relative %: 67 % (ref 43–77)

## 2011-09-16 LAB — PROTIME-INR
INR: 1.02 (ref 0.00–1.49)
Prothrombin Time: 13.6 seconds (ref 11.6–15.2)

## 2011-09-16 LAB — TROPONIN I: Troponin I: <0.3 ng/mL (ref <0.30)

## 2011-09-16 LAB — CBC
MCH: 29.2 pg (ref 26.0–34.0)
Platelets: 153 10*3/uL (ref 150–400)
RBC: 3.87 MIL/uL — ABNORMAL LOW (ref 4.22–5.81)
WBC: 3.7 10*3/uL — ABNORMAL LOW (ref 4.0–10.5)

## 2011-09-16 MED ORDER — MOXIFLOXACIN HCL 400 MG PO TABS: 400.0000 mg | ORAL_TABLET | Freq: Every day | ORAL | Status: DC

## 2011-09-16 MED ORDER — IOHEXOL 350 MG/ML SOLN
80.0000 mL | Freq: Once | INTRAVENOUS | Status: AC | PRN
  Administered 2011-09-16: 80 mL via INTRAVENOUS

## 2011-09-16 NOTE — ED Notes (Signed)
C/o mix of dark and red blood "gathers in my throat until i have to spit it up"-started 4/6-NAD at present

## 2011-09-16 NOTE — ED Notes (Signed)
Pt returned from radiology.

## 2011-09-16 NOTE — ED Provider Notes (Signed)
History     CSN: [id number]  Arrival date & time 09/16/11  1409   First MD Initiated Contact with Patient 09/16/11 1451      Chief Complaint  Patient presents with  . Hemoptysis    (Consider location/radiation/quality/duration/timing/severity/associated sxs/prior treatment) HPI History provided by pt and his daughter.  Pt was diagnosed w/ esophageal cancer last year during evaluation for etiology of RUE DVT.  He underwent chemotherapy until 05/2011.  Most recent PET scan in 07/2011 which confirmed that patient was in remission.  3 days ago, pt developed intermittent sensation of mucous pooling in throat and his daughter reports that approx 1 tablespoon of blood would come up when he coughed.  This has occurred at least three times this afternoon.  Pt has otherwise not been coughing and he denies fever and CP.  He has however, had worse than baseline SOB over the past 3-4 days.  Denies extremity edema/pain.  Denies abd pain, vomiting, diarrhea and blood in stool.   He was anti-coagulated w/ arixtra until Friday when Dr. Myna Hidalgo heard about hemopytsis and recommended he discontinue.  Dr. Myna Hidalgo also recommended that patient have his coags checked in ED today.    Past Medical History  Diagnosis Date  . Diabetes mellitus   . Lung mass   . Lung cancer      history of stage IIIA non-small cell lung cancer who is currently being treated with both  Radiation and Chemotherapy  . DVT (deep venous thrombosis)     IN RIGHT ARM 11/2010   . BPH (benign prostatic hyperplasia)   . COPD (chronic obstructive pulmonary disease)        . Hyperlipidemia   . HTN (hypertension)   . Allergic rhinitis   . Allergic conjunctivitis   . Diabetic peripheral neuropathy   . DJD (degenerative joint disease)     Left Knee  . Sleep apnea     CPAP at night  --- 8-10 YR AGO....    Past Surgical History  Procedure Date  . Insertion of a left subclavian port-a-cath. 12/17/10    [place]  . Portacath placement  04/23/2011    Procedure: INSERTION PORT-A-CATH;  Surgeon: Norton Blizzard, MD;  Location: [facility] OR;  Service: Thoracic;;  Revision of  Porta-Cath  . Fiberoptic bronchoscopy 12/11/2010    Family History  Problem Relation Age of Onset  . Coronary artery disease    . Cancer    . Multiple sclerosis    . Diabetes type II    . Anesthesia problems Neg Hx   . Hypotension Neg Hx   . Malignant hyperthermia Neg Hx   . Pseudochol deficiency Neg Hx     History  Substance Use Topics  . Smoking status: Former Smoker -- 1.0 packs/day for 50 years    Types: Cigarettes    Quit date: 06/10/1992  . Smokeless tobacco: Never Used  . Alcohol Use: No      Review of Systems  All other systems reviewed and are negative.    Allergies  Review of patient's allergies indicates no known allergies.  Home Medications   Current Outpatient Rx  Name Route Sig Dispense Refill  . FONDAPARINUX SODIUM 10 MG/0.8ML [place] SOLN Subcutaneous Inject 0.8 mLs (10 mg total) into the skin at bedtime. 30 Syringe 6  . CETIRIZINE HCL 10 MG PO TABS Oral Take 10 mg by mouth every morning.      Marland Kitchen DEXAMETHASONE 4 MG PO TABS  Take 1 tab two times a  day the day before Alimta chemo. Take 2 tabs two times a day starting the day after chemo for 3 days. 30 tablet 1  . FOLIC ACID 1 MG PO TABS Oral Take 1 mg by mouth every morning.     Marland Kitchen GLIMEPIRIDE 2 MG PO TABS Oral Take 2 mg by mouth daily as needed. Take when blood sugar is > 150     . GLUCOSAMINE-CHONDROITIN 500-400 MG PO TABS Oral Take 2 tablets by mouth every morning.     . INSULIN LISPRO (HUMAN) 100 UNIT/ML [place] SOLN Subcutaneous Inject into the skin. Use as directed.  BS 150-200  Take 2units BS 201-250  4 units. BS 251 - 300 6units. BS  301-350 8 units.    Marland Kitchen METFORMIN HCL 500 MG PO TABS Oral Take 500 mg by mouth 2 (two) times daily.    Marland Kitchen MIRTAZAPINE 7.5 MG PO TABS Oral Take 1 tablet (7.5 mg total) by mouth at bedtime. 30 tablet 2  . ONDANSETRON HCL 8 MG PO TABS  Take 1 tab two  times a day starting the day after chemo for 3 days. Then take 1 tab two times a day as needed for nausea or vomiting.  30 tablet 1  . PROCHLORPERAZINE MALEATE 10 MG PO TABS Oral Take 1 tablet (10 mg total) by mouth every 6 (six) hours as needed (Nausea or vomiting). 30 tablet 1  . SIMVASTATIN 40 MG PO TABS Oral Take 40 mg by mouth at bedtime.     . SOLIFENACIN SUCCINATE 5 MG PO TABS Oral Take 1 tablet (5 mg total) by mouth daily. 30 tablet 3  . ZOLPIDEM TARTRATE 10 MG PO TABS Oral Take 10 mg by mouth at bedtime as needed. For sleep       BP 114/66  Pulse 82  Temp(Src) 97.4 F (36.3 C) (Oral)  Resp 20  Ht 5\' 10"  (1.778 m)  Wt 198 lb (89.812 kg)  BMI 28.41 kg/m2  SpO2 94%  Physical Exam  Nursing note and vitals reviewed. Constitutional: He is oriented to person, place, and time. He appears well-developed and well-nourished. No distress.  HENT:  Head: Normocephalic and atraumatic.  Eyes:       Normal appearance  Neck: Normal range of motion.  Pulmonary/Chest: Effort normal and breath sounds normal. No respiratory distress. He exhibits no tenderness.  Abdominal: Soft. Bowel sounds are normal. He exhibits no distension. There is no tenderness.  Musculoskeletal:       No peripheral edema or calf tenderness  Neurological: He is alert and oriented to person, place, and time.  Skin: Skin is warm and dry. No rash noted.  Psychiatric: He has a normal mood and affect. His behavior is normal.    ED Course  Procedures (including critical care time)   Date: 09/16/2011  Rate: 77  Rhythm: normal sinus rhythm and premature atrial contractions (PAC)  QRS Axis: normal  Intervals: normal  ST/T Wave abnormalities: normal  Conduction Disutrbances:none  Narrative Interpretation:   Old EKG Reviewed: none available   Labs Reviewed - No data to display Dg Chest 2 View  09/16/2011  *RADIOLOGY REPORT*  Clinical Data: Hemoptysis.  History of lung cancer.  CHEST - 2 VIEW  Comparison: Chest  radiograph 04/23/2011 and 04/22/2011.  Chest CT 08/03/2010.  Findings: Left subclavian Port-A-Cath terminates in the superior vena cava.  Heart, mediastinal, and hilar contours are stable. Paratracheal contour appears within normal limits, and stable compared to 04/23/2011.  There is a new patchy opacity  with peribronchial thickening in the left lower lobe.  This is new compared to prior radiographs of November 2012.  The right costophrenic angle is blunted.  No focal opacities in the right lung.  IMPRESSION: New airspace disease and peribronchial thickening in the left lower lobe is suspicious for pneumonia.  Otherwise, stable examination.  This is made a call report.  Original Report Authenticated By: Britta Mccreedy, M.D.     1. Community acquired pneumonia       MDM  78yo M w/ recent h/o esophageal cancer and anti-coagulated for remote RUE DVT presents w/ c/o hemoptysis x 4 days.  Associated w/ worse than baseline SOB.  Afebrile, VS w/in nml range, no respiratory distress, lungs clear, abd benign/non-tender on exam.  CXR shows possible left lower lobe pneumonia.  D/t significant RF, will obtain CT angio chest to r/o PE.    EKG non-ischemic and labs unremarkable.  CT angio shows left lower and right middle lobe pneumonia.  Results discussed w/ pt and his daughter.  Pt prefers to be treated for on an outpatient basis.  His daughter who is a Publishing rights manager lives across the street.  Pt is well-appearing, afebrile, stable VS (HR 80) and no respiratory distress.  He has no RF for HCAP.  Has a PCP to f/u with this week and an appt scheduled w/ his oncologist next week.  Dr. Wylene Men in agreement w/ A&P.  D/c'd home w/ avelox.  Return precautions discussed.         Arie Sabina Golf, [place] 09/16/11 1719

## 2011-09-16 NOTE — ED Provider Notes (Signed)
5:13 PM  I performed a history and physical examination of [name] and discussed his management with Ruby Cola PA-C.  I agree with the history, physical, assessment, and plan of care, with the following exceptions: None  The patient is awake, alert, and oriented in no apparent distress, with good skin color, no cyanosis, skin warm and dry, no respiratory distress, no accessory muscle usage, normal respiratory rate, normal oxygen saturation on room air. His lungs are clear to auscultation with good air exchange in all fields except for at the left lung base were some rales can be heard. I cannot appreciate the right sided pneumonia on auscultation but have reviewed it on the radiographic studies. Blood pressure and heart rate are both normal in control, the patient is afebrile. The patient appears stable for outpatient treatment of pneumonia and followup with his physicians later this week. The patient and his daughter, a Publishing rights manager, state their understanding of and agreement with the diagnosis and plan of care.  I was present for the following procedures: None Time Spent in Critical Care of the patient: None Time spent in discussions with the patient and family: 7 minutes  [name] D    Felisa Bonier, MD 09/16/11 1715

## 2011-09-16 NOTE — Progress Notes (Signed)
Pt's daughter called to report that Mr. Voorheis had coughed up quarter size mucus with bright red blood.  This happened about 4 times on Saturday.  Sunday it happened 3 times but the blood was darker.  They called the MD on call and were told to stop the Arixtra Sat and Sun.  Dr. Myna Hidalgo made aware.  He wants them to stop the Arixtra until he can review pt's last scan and then will get back this family.  Lupita Leash, daughter, given this message and voices understanding.

## 2011-09-16 NOTE — ED Notes (Signed)
MD at bedside. 

## 2011-09-16 NOTE — Discharge Instructions (Signed)
Take antibiotic as prescribed.  Discontinue your blood-thinner until your primary care or oncology physician tells you otherwise.  Follow up with Dr. Felipa Eth this week if possible and Dr. Myna Hidalgo as scheduled.  You should return to the ER if your shortness of breath or bleeding worsens. Pneumonia, Adult Pneumonia is an infection of the lungs.  CAUSES Pneumonia may be caused by bacteria or a virus. Usually, these infections are caused by breathing infectious particles into the lungs (respiratory tract). SYMPTOMS   Cough.   Fever.   Chest pain.   Increased rate of breathing.   Wheezing.   Mucus production.  DIAGNOSIS  If you have the common symptoms of pneumonia, your caregiver will typically confirm the diagnosis with a chest X-ray. The X-ray will show an abnormality in the lung (pulmonary infiltrate) if you have pneumonia. Other tests of your blood, urine, or sputum may be done to find the specific cause of your pneumonia. Your caregiver may also do tests (blood gases or pulse oximetry) to see how well your lungs are working. TREATMENT  Some forms of pneumonia may be spread to other people when you cough or sneeze. You may be asked to wear a mask before and during your exam. Pneumonia that is caused by bacteria is treated with antibiotic medicine. Pneumonia that is caused by the influenza virus may be treated with an antiviral medicine. Most other viral infections must run their course. These infections will not respond to antibiotics.  PREVENTION A pneumococcal shot (vaccine) is available to prevent a common bacterial cause of pneumonia. This is usually suggested for:  People over 15 years old.   Patients on chemotherapy.   People with chronic lung problems, such as bronchitis or emphysema.   People with immune system problems.  If you are over 65 or have a high risk condition, you may receive the pneumococcal vaccine if you have not received it before. In some countries, a routine  influenza vaccine is also recommended. This vaccine can help prevent some cases of pneumonia.You may be offered the influenza vaccine as part of your care. If you smoke, it is time to quit. You may receive instructions on how to stop smoking. Your caregiver can provide medicines and counseling to help you quit. HOME CARE INSTRUCTIONS   Cough suppressants may be used if you are losing too much rest. However, coughing protects you by clearing your lungs. You should avoid using cough suppressants if you can.   Your caregiver may have prescribed medicine if he or she thinks your pneumonia is caused by a bacteria or influenza. Finish your medicine even if you start to feel better.   Your caregiver may also prescribe an expectorant. This loosens the mucus to be coughed up.   Only take over-the-counter or prescription medicines for pain, discomfort, or fever as directed by your caregiver.   Do not smoke. Smoking is a common cause of bronchitis and can contribute to pneumonia. If you are a smoker and continue to smoke, your cough may last several weeks after your pneumonia has cleared.   A cold steam vaporizer or humidifier in your room or home may help loosen mucus.   Coughing is often worse at night. Sleeping in a semi-upright position in a recliner or using a couple pillows under your head will help with this.   Get rest as you feel it is needed. Your body will usually let you know when you need to rest.  SEEK IMMEDIATE MEDICAL CARE IF:  Your illness becomes worse. This is especially true if you are elderly or weakened from any other disease.   You cannot control your cough with suppressants and are losing sleep.   You begin coughing up blood.   You develop pain which is getting worse or is uncontrolled with medicines.   You have a fever.   Any of the symptoms which initially brought you in for treatment are getting worse rather than better.   You develop shortness of breath or chest  pain.  MAKE SURE YOU:   Understand these instructions.   Will watch your condition.   Will get help right away if you are not doing well or get worse.  Document Released: 05/27/2005 Document Revised: 05/16/2011 Document Reviewed: 08/16/2010 [facility] Patient Information 2012 [place], [place].

## 2011-09-16 NOTE — ED Notes (Signed)
Pt transported to CT via stretcher.  

## 2011-09-16 NOTE — ED Notes (Signed)
Patient transported to X-ray via stretcher 

## 2011-09-16 NOTE — ED Notes (Signed)
Pt returned from CT, no change in pt condition.

## 2011-09-17 ENCOUNTER — Telehealth: Payer: Self-pay | Admitting: *Deleted

## 2011-09-17 NOTE — Telephone Encounter (Signed)
Called pt's home to check on him after he was asked to be evaluated in the ED yesterday for hemoptysis. His dgtr had called back a second time after speaking to Alvino Chapel stating that he was bring up an increasing amount of bright red blood. Dr Myna Hidalgo stated he would most likely require a bronchoscopy by Dr Edwyna Shell and was advised to go to the ER at 1247. Per the ER records he was treated for pneumonia and released with the understanding that someone would be at home to "keep an eye on him". Mrs. Fayson said he was feeling much better today.  Spoke to his dgtr Myriam Jacobson this afternoon to let her know that Dr Myna Hidalgo wants him to stay off the Arixtra for 2 weeks. She understands that if he has any additional bleeding despite being off the Arixtra he is going to have to see Dr Edwyna Shell. He is due for f/u with Dr Myna Hidalgo next week but knows to call in the meantime if something comes up.

## 2011-09-24 ENCOUNTER — Other Ambulatory Visit (HOSPITAL_BASED_OUTPATIENT_CLINIC_OR_DEPARTMENT_OTHER): Payer: Medicare Other | Admitting: Lab

## 2011-09-24 ENCOUNTER — Ambulatory Visit (HOSPITAL_BASED_OUTPATIENT_CLINIC_OR_DEPARTMENT_OTHER): Payer: Medicare Other | Admitting: Hematology & Oncology

## 2011-09-24 ENCOUNTER — Other Ambulatory Visit: Payer: Self-pay | Admitting: Thoracic Surgery

## 2011-09-24 ENCOUNTER — Other Ambulatory Visit: Payer: Self-pay | Admitting: *Deleted

## 2011-09-24 ENCOUNTER — Ambulatory Visit: Payer: Medicare Other

## 2011-09-24 VITALS — Temp 97.9°F | Ht 70.0 in | Wt 206.0 lb

## 2011-09-24 DIAGNOSIS — C3410 Malignant neoplasm of upper lobe, unspecified bronchus or lung: Secondary | ICD-10-CM

## 2011-09-24 DIAGNOSIS — R042 Hemoptysis: Secondary | ICD-10-CM

## 2011-09-24 DIAGNOSIS — I82B19 Acute embolism and thrombosis of unspecified subclavian vein: Secondary | ICD-10-CM

## 2011-09-24 DIAGNOSIS — C3490 Malignant neoplasm of unspecified part of unspecified bronchus or lung: Secondary | ICD-10-CM

## 2011-09-24 DIAGNOSIS — O2230 Deep phlebothrombosis in pregnancy, unspecified trimester: Secondary | ICD-10-CM

## 2011-09-24 LAB — CBC WITH DIFFERENTIAL (CANCER CENTER ONLY)
BASO#: 0 10*3/uL (ref 0.0–0.2)
BASO%: 0.3 % (ref 0.0–2.0)
EOS%: 2.1 % (ref 0.0–7.0)
Eosinophils Absolute: 0.1 10*3/uL (ref 0.0–0.5)
HCT: 37.1 % — ABNORMAL LOW (ref 38.7–49.9)
HGB: 12.2 g/dL — ABNORMAL LOW (ref 13.0–17.1)
LYMPH#: 0.9 10*3/uL (ref 0.9–3.3)
LYMPH%: 15.8 % (ref 14.0–48.0)
MCH: 29 pg (ref 28.0–33.4)
MCHC: 32.9 g/dL (ref 32.0–35.9)
MCV: 88 fL (ref 82–98)
MONO#: 0.6 10*3/uL (ref 0.1–0.9)
MONO%: 11 % (ref 0.0–13.0)
NEUT#: 4.1 10*3/uL (ref 1.5–6.5)
NEUT%: 70.8 % (ref 40.0–80.0)
Platelets: 169 10*3/uL (ref 145–400)
RBC: 4.21 10*6/uL (ref 4.20–5.70)
RDW: 15.9 % — ABNORMAL HIGH (ref 11.1–15.7)
WBC: 5.8 10*3/uL (ref 4.0–10.0)

## 2011-09-24 LAB — APTT: aPTT: 38 seconds — ABNORMAL HIGH (ref 24–37)

## 2011-09-24 LAB — PROTHROMBIN TIME
INR: 1 (ref <1.50)
Prothrombin Time: 13.6 seconds (ref 11.6–15.2)

## 2011-09-24 MED ORDER — SODIUM CHLORIDE 0.9 % IJ SOLN
10.0000 mL | INTRAMUSCULAR | Status: DC | PRN
  Administered 2011-09-24: 10 mL via INTRAVENOUS
  Filled 2011-09-24: qty 10

## 2011-09-24 MED ORDER — HEPARIN SOD (PORK) LOCK FLUSH 100 UNIT/ML IV SOLN
500.0000 [IU] | Freq: Once | INTRAVENOUS | Status: AC
  Administered 2011-09-24: 500 [IU] via INTRAVENOUS
  Filled 2011-09-24: qty 5

## 2011-09-24 NOTE — Progress Notes (Signed)
This office note has been dictated.

## 2011-09-24 NOTE — Progress Notes (Signed)
[place] presented for Portacath access and flush. Proper placement of portacath confirmed by CXR. Portacath located in the Right chest wall accessed with  H 20 needle. Clean, Dry and Intact Good blood return present. Portacath flushed with 20ml NS and 500U/14ml Heparin per protocol and needle removed intact. Procedure without incident. Patient tolerated procedure well.

## 2011-09-24 NOTE — Progress Notes (Signed)
CC:   Ines Bloomer, M.D. Larina Earthly, M.D.  DIAGNOSES: 1. 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 2. DVT of the right subclavian vein. 3. Recurrent hemoptysis.  CURRENT THERAPY:  Observation.  INTERVAL HISTORY:  Mr. Palmeri comes in for an unscheduled visit.  He reports he has been having episodes of hemoptysis.  He called about a week or so ago with this.  He was on Arixtra at that time.  I told him to stop the Arixtra.  He did go the emergency room.  He did have some x-rays done.  This was on April 8th.  He had a chest x-ray done, which showed airspace disease in the left lower lobe.  He then had a CT angiogram done.  This showed collateral vessels within the upper chest wall.  This was stenosis of the right brachiocephalic vein.  There was a right paratracheal soft tissue nodule measuring 2.3 x 1.4 cm, which was unchanged compared to the October CT scan.  There were some stable mediastinal and right hilar nodes with no evidence of progressive lymphadenopathy.  There is a small pericardial effusion. Again, there were  noted to be infiltrates.  There was a confluent airspace opacity in left lower lobe measuring 3.1 x 3.4 cm.  I did speak with Dr. Dewayne Shorter today.  Dr. Edwyna Shell, who knows Mr. Tegtmeyer, will see him and likely do a bronchoscopy on him.  Mr. Bottino does not have dyspnea.  He is having no chest pain.  He is having a little nausea.  There is no change in bowel or bladder habits. He has had no arm or leg swelling.  PHYSICAL EXAMINATION:  This is a well-developed well-nourished white gentleman in no obvious distress.  Vital signs:  97.1, pulse 82, respiratory 18, blood pressure is 112/67.  Weight is 206.  Head and neck exam shows a normocephalic, atraumatic skull.  There are no ocular or oral lesions.  There are no palpable cervical or supraclavicular lymph nodes.  Lungs:  Clear to percussion and auscultation bilaterally. Cardiac:  Regular rate and  rhythm with a normal S1 and S2.  There are no murmurs, rubs or bruits.  Abdomen:  Soft with good bowel sounds.  There is no palpable abdominal mass.  There is no palpable hepatosplenomegaly. Extremities:  No clubbing, cyanosis or edema.  There is no lymphedema of the right arm.  Neurologic:  No focal neurological deficits.  LABORATORY STUDIES:  White cell count is 5.8, hemoglobin 12.2, hematocrit 37.1, platelet count 169.  IMPRESSION:  Mr. Gaut is an 78 year old gentleman with stage IIIB inoperable non-small-cell lung cancer of the right lung.  He did undergo chemoradiation therapy.  He then underwent some full-dose chemotherapy. He basically completed this back in January.  I would definitely worry about recurrence.  As such, I think Dr. Edwyna Shell has a great idea about doing a bronchoscopy on him to see if there is evidence of local recurrence.  We will keep him off his Arixtra for now.  He has been on Arixtra for about 9 months.  He had a very nice response with respect to his DVTs so I think we are okay with him off anticoagulation right now.  He was set up for a CT scan done next month.  We want to go ahead and cancel that since he just had one done.  Will keep Mr. Drab regular appointment that he has with me next month. Will see what Dr. Edwyna Shell finds.  Hopefully there will not be any obvious local recurrence.    ______________________________ Josph Macho, M.D. PRE/MEDQ  D:  09/24/2011  T:  09/24/2011  Job:  [id number]

## 2011-09-25 ENCOUNTER — Other Ambulatory Visit: Payer: Self-pay

## 2011-09-25 ENCOUNTER — Ambulatory Visit
Admission: RE | Admit: 2011-09-25 | Discharge: 2011-09-25 | Disposition: A | Discharge status: Home / Self Care | Payer: Medicare Other | Source: Ambulatory Visit | Attending: Thoracic Surgery | Admitting: Thoracic Surgery

## 2011-09-25 ENCOUNTER — Ambulatory Visit (INDEPENDENT_AMBULATORY_CARE_PROVIDER_SITE_OTHER): Payer: Medicare Other | Admitting: Thoracic Surgery

## 2011-09-25 ENCOUNTER — Encounter: Payer: Self-pay | Admitting: Thoracic Surgery

## 2011-09-25 ENCOUNTER — Telehealth: Payer: Self-pay | Admitting: Hematology & Oncology

## 2011-09-25 DIAGNOSIS — C3490 Malignant neoplasm of unspecified part of unspecified bronchus or lung: Secondary | ICD-10-CM

## 2011-09-25 DIAGNOSIS — R042 Hemoptysis: Secondary | ICD-10-CM

## 2011-09-25 NOTE — Telephone Encounter (Signed)
MD canceled CT on 10-17-11

## 2011-09-25 NOTE — Progress Notes (Signed)
HPI the patient has had 2 weeks of a low-grade hemoptysis. He's been treated with antibiotics for a left lower lobe pneumonia. His chest x-ray today still shows some infiltrate in the left lower lobe and questionable nodularity. The patient has undergone radiation and chemotherapy for a non-small cell lung cancer. He was on Arixtra but this was stopped 1 week ago the hemoptysis has continued to. We plan to do a fiberoptic bronchoscopy just to be sure that there is no endobronchial lesion causing his hemoptysis. We will do this on April 19 at  as an outpatient. Risk of the procedure were explained to the patient. Patient agrees to the procedure   Current Outpatient Prescriptions  Medication Sig Dispense Refill  . cetirizine (ZYRTEC) 10 MG tablet Take 10 mg by mouth every morning.        . folic acid (FOLVITE) 1 MG tablet Take 1 mg by mouth every morning.       . glimepiride (AMARYL) 2 MG tablet Take 2 mg by mouth daily as needed. Take when blood sugar is > 150       . glucosamine-chondroitin 500-400 MG tablet Take 2 tablets by mouth every morning.       . insulin lispro (HUMALOG) 100 UNIT/ML injection Inject into the skin. Use as directed.  BS 150-200  Take 2units BS 201-250  4 units. BS 251 - 300 6units. BS  301-350 8 units.      . metFORMIN (GLUCOPHAGE) 500 MG tablet Take 500 mg by mouth 2 (two) times daily.      . mirtazapine (REMERON) 7.5 MG tablet Take 1 tablet (7.5 mg total) by mouth at bedtime.  30 tablet  2  . moxifloxacin (AVELOX) 400 MG tablet Take 1 tablet (400 mg total) by mouth daily.  10 tablet  0  . pseudoephedrine-guaifenesin (MUCINEX D) 60-600 MG per tablet Take 1 tablet by mouth every 12 (twelve) hours. Patient used this medication for congestion.      . Psyllium (METAMUCIL PO) Take 2 capsules by mouth daily.      . simvastatin (ZOCOR) 40 MG tablet Take 40 mg by mouth at bedtime.       . solifenacin (VESICARE) 5 MG tablet Take 1 tablet (5 mg total) by mouth daily.  30  tablet  3  . zolpidem (AMBIEN) 10 MG tablet Take 10 mg by mouth at bedtime as needed. For sleep       . dexamethasone (DECADRON) 4 MG tablet Take 1 tab two times a day the day before Alimta chemo. Take 2 tabs two times a day starting the day after chemo for 3 days.  30 tablet  1  . fondaparinux (ARIXTRA) 10 MG/0.8ML SOLN Inject 0.8 mLs (10 mg total) into the skin at bedtime.  30 Syringe  6   No current facility-administered medications for this visit.   Facility-Administered Medications Ordered in Other Visits  Medication Dose Route Frequency Provider Last Rate Last Dose  . heparin lock flush 100 unit/mL  500 Units Intravenous Once Peter R Ennever, MD   500 Units at 09/24/11 1608  . DISCONTD: sodium chloride 0.9 % injection 10 mL  10 mL Intravenous PRN Peter R Ennever, MD   10 mL at 09/24/11 1030     Review of Systems: Hemoptysis recent dyspnea  Physical Exam lungs are clear to auscultation percussion chest x-ray shows improving infiltrate left lower lobe and a questionable area of nodularity   Diagnostic Tests:chest x-ray shows improving infiltrate left lower   lobe and a questionable area of nodularity     Impression: Stage IV non-small cell lung cancer the lungs hemoptysis   Plan:  Bronchoscopy      

## 2011-09-26 ENCOUNTER — Encounter (HOSPITAL_COMMUNITY): Payer: Self-pay | Admitting: *Deleted

## 2011-09-26 ENCOUNTER — Encounter (HOSPITAL_COMMUNITY): Payer: Self-pay | Admitting: Pharmacy Technician

## 2011-09-27 ENCOUNTER — Encounter (HOSPITAL_COMMUNITY): Payer: Self-pay | Admitting: Anesthesiology

## 2011-09-27 ENCOUNTER — Ambulatory Visit (HOSPITAL_COMMUNITY)
Admission: RE | Admit: 2011-09-27 | Discharge: 2011-09-27 | Disposition: A | Discharge status: Home / Self Care | Payer: Medicare Other | Source: Ambulatory Visit | Attending: Thoracic Surgery | Admitting: Thoracic Surgery

## 2011-09-27 ENCOUNTER — Encounter (HOSPITAL_COMMUNITY)
Admission: RE | Disposition: A | Discharge status: Home / Self Care | Payer: Self-pay | Source: Ambulatory Visit | Attending: Thoracic Surgery

## 2011-09-27 ENCOUNTER — Ambulatory Visit (HOSPITAL_COMMUNITY): Payer: Medicare Other | Admitting: Anesthesiology

## 2011-09-27 DIAGNOSIS — R042 Hemoptysis: Secondary | ICD-10-CM

## 2011-09-27 DIAGNOSIS — C3490 Malignant neoplasm of unspecified part of unspecified bronchus or lung: Secondary | ICD-10-CM | POA: Insufficient documentation

## 2011-09-27 HISTORY — PX: VIDEO BRONCHOSCOPY: SHX5072

## 2011-09-27 LAB — COMPREHENSIVE METABOLIC PANEL
Alkaline Phosphatase: 98 U/L (ref 39–117)
BUN: 12 mg/dL (ref 6–23)
GFR calc Af Amer: 90 mL/min — ABNORMAL LOW (ref >90)
Glucose, Bld: 140 mg/dL — ABNORMAL HIGH (ref 70–99)
Potassium: 3.7 mEq/L (ref 3.5–5.1)
Total Bilirubin: 0.4 mg/dL (ref 0.3–1.2)
Total Protein: 6.9 g/dL (ref 6.0–8.3)

## 2011-09-27 LAB — CBC
HCT: 36.7 % — ABNORMAL LOW (ref 39.0–52.0)
Hemoglobin: 12.2 g/dL — ABNORMAL LOW (ref 13.0–17.0)
MCH: 28.8 pg (ref 26.0–34.0)
MCHC: 33.2 g/dL (ref 30.0–36.0)

## 2011-09-27 LAB — GLUCOSE, CAPILLARY
Glucose-Capillary: 109 mg/dL — ABNORMAL HIGH (ref 70–99)
Glucose-Capillary: 91 mg/dL (ref 70–99)

## 2011-09-27 LAB — PROTIME-INR: Prothrombin Time: 13.1 seconds (ref 11.6–15.2)

## 2011-09-27 LAB — APTT: aPTT: 34 seconds (ref 24–37)

## 2011-09-27 SURGERY — BRONCHOSCOPY, VIDEO-ASSISTED
Anesthesia: General | Site: Chest | Wound class: Clean Contaminated

## 2011-09-27 MED ORDER — LACTATED RINGERS IV SOLN
INTRAVENOUS | Status: DC | PRN
  Administered 2011-09-27 (×2): via INTRAVENOUS

## 2011-09-27 MED ORDER — FENTANYL CITRATE 0.05 MG/ML IJ SOLN: 25.0000 ug | INTRAMUSCULAR | Status: DC | PRN

## 2011-09-27 MED ORDER — LIDOCAINE HCL (CARDIAC) 20 MG/ML IV SOLN
INTRAVENOUS | Status: DC | PRN
  Administered 2011-09-27: 80 mg via INTRAVENOUS

## 2011-09-27 MED ORDER — OXYCODONE HCL 5 MG PO TABS: 5.0000 mg | ORAL_TABLET | ORAL | Status: DC | PRN

## 2011-09-27 MED ORDER — ACETAMINOPHEN 650 MG RE SUPP
650.0000 mg | RECTAL | Status: DC | PRN
  Filled 2011-09-27: qty 1

## 2011-09-27 MED ORDER — FENTANYL CITRATE 0.05 MG/ML IJ SOLN
INTRAMUSCULAR | Status: DC | PRN
  Administered 2011-09-27 (×2): 50 ug via INTRAVENOUS

## 2011-09-27 MED ORDER — FENTANYL CITRATE 0.05 MG/ML IJ SOLN: 50.0000 ug | INTRAMUSCULAR | Status: DC | PRN

## 2011-09-27 MED ORDER — PROPOFOL 10 MG/ML IV EMUL
INTRAVENOUS | Status: DC | PRN
  Administered 2011-09-27: 150 mg via INTRAVENOUS

## 2011-09-27 MED ORDER — MIDAZOLAM HCL 2 MG/2ML IJ SOLN: 1.0000 mg | INTRAMUSCULAR | Status: DC | PRN

## 2011-09-27 MED ORDER — MUPIROCIN 2 % EX OINT: TOPICAL_OINTMENT | Freq: Once | CUTANEOUS | Status: DC

## 2011-09-27 MED ORDER — ACETAMINOPHEN 325 MG PO TABS
650.0000 mg | ORAL_TABLET | ORAL | Status: DC | PRN
  Filled 2011-09-27: qty 2

## 2011-09-27 MED ORDER — ONDANSETRON HCL 4 MG/2ML IJ SOLN
INTRAMUSCULAR | Status: DC | PRN
  Administered 2011-09-27: 4 mg via INTRAVENOUS

## 2011-09-27 MED ORDER — 0.9 % SODIUM CHLORIDE (POUR BTL) OPTIME
TOPICAL | Status: DC | PRN
  Administered 2011-09-27: 1000 mL

## 2011-09-27 MED ORDER — ONDANSETRON HCL 4 MG/2ML IJ SOLN
4.0000 mg | Freq: Four times a day (QID) | INTRAMUSCULAR | Status: DC | PRN
  Filled 2011-09-27: qty 2

## 2011-09-27 MED ORDER — SUCCINYLCHOLINE CHLORIDE 20 MG/ML IJ SOLN
INTRAMUSCULAR | Status: DC | PRN
  Administered 2011-09-27: 120 mg via INTRAVENOUS

## 2011-09-27 MED ORDER — MIDAZOLAM HCL 5 MG/5ML IJ SOLN
INTRAMUSCULAR | Status: DC | PRN
  Administered 2011-09-27: 1 mg via INTRAVENOUS

## 2011-09-27 MED ORDER — LACTATED RINGERS IV SOLN
INTRAVENOUS | Status: DC
  Administered 2011-09-27: 11:00:00 via INTRAVENOUS

## 2011-09-27 SURGICAL SUPPLY — 31 items
BALL CTTN LRG ABS STRL LF (GAUZE/BANDAGES/DRESSINGS)
BRUSH CYTOL CELLEBRITY 1.5X140 (MISCELLANEOUS) ×2 IMPLANT
CANISTER SUCTION 2500CC (MISCELLANEOUS) ×2 IMPLANT
CLOTH BEACON ORANGE TIMEOUT ST (SAFETY) ×2 IMPLANT
CONT SPEC 4OZ CLIKSEAL STRL BL (MISCELLANEOUS) ×2 IMPLANT
COTTONBALL LRG STERILE PKG (GAUZE/BANDAGES/DRESSINGS) IMPLANT
COVER TABLE BACK 60X90 (DRAPES) ×2 IMPLANT
FILTER STRAW FLUID ASPIR (MISCELLANEOUS) IMPLANT
FORCEPS BIOP RJ4 1.8 (CUTTING FORCEPS) IMPLANT
GLOVE SURG SIGNA 7.5 PF LTX (GLOVE) ×2 IMPLANT
GOWN STRL NON-REIN LRG LVL3 (GOWN DISPOSABLE) ×2 IMPLANT
KIT ROOM TURNOVER OR (KITS) ×2 IMPLANT
MARKER SKIN DUAL TIP RULER LAB (MISCELLANEOUS) ×2 IMPLANT
NDL BIOPSY TRANSBRONCH 21G (NEEDLE) IMPLANT
NDL BLUNT 18X1 FOR OR ONLY (NEEDLE) IMPLANT
NEEDLE 22X1 1/2 (OR ONLY) (NEEDLE) IMPLANT
NEEDLE BIOPSY TRANSBRONCH 21G (NEEDLE) IMPLANT
NEEDLE BLUNT 18X1 FOR OR ONLY (NEEDLE) IMPLANT
NS IRRIG 1000ML POUR BTL (IV SOLUTION) ×2 IMPLANT
OIL SILICONE PENTAX (PARTS (SERVICE/REPAIRS)) IMPLANT
PAD ARMBOARD 7.5X6 YLW CONV (MISCELLANEOUS) ×4 IMPLANT
SPONGE GAUZE 4X4 12PLY (GAUZE/BANDAGES/DRESSINGS) IMPLANT
SYR 20ML ECCENTRIC (SYRINGE) ×2 IMPLANT
SYR 5ML LUER SLIP (SYRINGE) ×1 IMPLANT
SYR CONTROL 10ML LL (SYRINGE) IMPLANT
TOWEL OR 17X24 6PK STRL BLUE (TOWEL DISPOSABLE) ×2 IMPLANT
TRAP SPECIMEN MUCOUS 40CC (MISCELLANEOUS) ×2 IMPLANT
TUBE CONNECTING 12X1/4 (SUCTIONS) ×2 IMPLANT
VALVE BIOPSY  SINGLE USE (MISCELLANEOUS)
VALVE BIOPSY SINGLE USE (MISCELLANEOUS) IMPLANT
VALVE SUCTION BRONCHIO DISP (MISCELLANEOUS) IMPLANT

## 2011-09-27 NOTE — Discharge Instructions (Signed)
Hemoptysis Hemoptysis means coughing up blood from your airways or lungs. The most common cause of hemoptysis is the least serious. It is usually a ruptured small blood vessel caused by coughing or an infection. In some cases, the cause of hemoptysis is not known. Hemoptysis may also be a sign of a more serious problem, such as cancer, pneumonia, a blood clot, or other types of lung disease. You should always contact a caregiver if you develop hemoptysis. This is important, as even mild cases of hemoptysis may lead to serious breathing problems. Major bleeding from the airway is considered a medical emergency, and needs to be evaluated and managed promptly to avoid complications, disability, or death. Your caregiver may perform tests to find out if the bleeding is coming from your lungs. Some of these tests may include:  A chest X-ray.   A computerized X-ray scan (CT scan or CAT scan).   Bronchoscopy. This test uses a flexible tube (a bronchoscope) to see inside the lungs.  TREATMENT   Treatment for hemoptysis depends on the cause. It also depends on the quantity of blood. Infrequent, mild hemoptysis usually does not require specific, immediate treatment.   If the cause of hemoptysis is unknown, treatment may involve monitoring for at least 2 or 3 years. If you have a normal chest X-ray and bronchoscopy, the hemoptysis usually clears within 6 months.  SEEK MEDICAL CARE IF:   For follow-up care as directed.   If your symptoms are not improving or are getting worse.   If you have any other questions or concerns.  SEEK IMMEDIATE MEDICAL CARE IF:   You begin to cough up large amounts of blood.   You develop problems with your breathing.   You begin vomiting blood or see blood in your stool.   You develop chest pain.   You feel faint or pass out.   You develop a fever over 102 F (38.9 C), or as your caregiver suggests.  Document Released: 07/04/2004 Document Revised: 05/16/2011  Document Reviewed: 10/10/2009 [facility] Patient Information 2012 [place], [place].Hemoptysis Hemoptysis means coughing up blood from your airways or lungs. The most common cause of hemoptysis is the least serious. It is usually a ruptured small blood vessel caused by coughing or an infection. In some cases, the cause of hemoptysis is not known. Hemoptysis may also be a sign of a more serious problem, such as cancer, pneumonia, a blood clot, or other types of lung disease. You should always contact a caregiver if you develop hemoptysis. This is important, as even mild cases of hemoptysis may lead to serious breathing problems. Major bleeding from the airway is considered a medical emergency, and needs to be evaluated and managed promptly to avoid complications, disability, or death. Your caregiver may perform tests to find out if the bleeding is coming from your lungs. Some of these tests may include:  A chest X-ray.   A computerized X-ray scan (CT scan or CAT scan).   Bronchoscopy. This test uses a flexible tube (a bronchoscope) to see inside the lungs.  TREATMENT   Treatment for hemoptysis depends on the cause. It also depends on the quantity of blood. Infrequent, mild hemoptysis usually does not require specific, immediate treatment.   If the cause of hemoptysis is unknown, treatment may involve monitoring for at least 2 or 3 years. If you have a normal chest X-ray and bronchoscopy, the hemoptysis usually clears within 6 months.  SEEK MEDICAL CARE IF:   For follow-up care as  directed.   If your symptoms are not improving or are getting worse.   If you have any other questions or concerns.  SEEK IMMEDIATE MEDICAL CARE IF:   You begin to cough up large amounts of blood.   You develop problems with your breathing.   You begin vomiting blood or see blood in your stool.   You develop chest pain.   You feel faint or pass out.   You develop a fever over 102 F (38.9 C), or as your  caregiver suggests.  Document Released: 07/04/2004 Document Revised: 05/16/2011 Document Reviewed: 10/10/2009 [facility] Patient Information 2012 [place], [place].

## 2011-09-27 NOTE — Brief Op Note (Signed)
09/27/2011  12:39 PM  PATIENT:  [name]  78 y.o. male  PRE-OPERATIVE DIAGNOSIS:  HEMPOTYSIS  POST-OPERATIVE DIAGNOSIS:  HEMPOTYSIS  PROCEDURE:  Procedure(s) (LRB): VIDEO BRONCHOSCOPY (N/A)  SURGEON:  Surgeon(s) and Role:    * Ines Bloomer, MD - Primary  PHYSICIAN ASSISTANT:   ASSISTANTS: none   ANESTHESIA:   general  EBL:  Total I/O In: 1000 [I.V.:1000] Out: 0   BLOOD ADMINISTERED:none  DRAINS: none   LOCAL MEDICATIONS USED:  NONE  SPECIMEN:  Aspirate  DISPOSITION OF SPECIMEN:  PATHOLOGY  COUNTS:  YES  TOURNIQUET:  * No tourniquets in log *  DICTATION: .Other Dictation: Dictation Number [id number]  PLAN OF CARE: Discharge to home after PACU  PATIENT DISPOSITION:  PACU - hemodynamically stable.   Delay start of Pharmacological VTE agent (>24hrs) due to surgical blood loss or risk of bleeding: yes

## 2011-09-27 NOTE — Anesthesia Postprocedure Evaluation (Signed)
  Anesthesia Post-op Note  Patient: [name]  Procedure(s) Performed: Procedure(s) (LRB): VIDEO BRONCHOSCOPY (N/A)  Patient Location: PACU  Anesthesia Type: General  Level of Consciousness: awake  Airway and Oxygen Therapy: Patient Spontanous Breathing  Post-op Pain: mild  Post-op Assessment: Post-op Vital signs reviewed, Patient's Cardiovascular Status Stable, Respiratory Function Stable, Patent Airway, No signs of Nausea or vomiting and Pain level controlled  Post-op Vital Signs: stable  Complications: No apparent anesthesia complications

## 2011-09-27 NOTE — Transfer of Care (Signed)
Immediate Anesthesia Transfer of Care Note  Patient: [name]  Procedure(s) Performed: Procedure(s) (LRB): VIDEO BRONCHOSCOPY (N/A)  Patient Location: PACU  Anesthesia Type: General  Level of Consciousness: awake, alert , oriented and patient cooperative  Airway & Oxygen Therapy: Patient Spontanous Breathing and Patient connected to nasal cannula oxygen  Post-op Assessment: Report given to PACU RN, Post -op Vital signs reviewed and stable and Patient moving all extremities  Post vital signs: Reviewed and stable  Complications: No apparent anesthesia complications

## 2011-09-27 NOTE — Preoperative (Signed)
Beta Blockers   Reason not to administer Beta Blockers:Not Applicable 

## 2011-09-27 NOTE — Interval H&P Note (Signed)
History and Physical Interval Note:  09/27/2011 9:42 AM  [name]  has presented today for surgery, with the diagnosis of HEMPOTYSIS  The various methods of treatment have been discussed with the patient and family. After consideration of risks, benefits and other options for treatment, the patient has consented to  Procedure(s) (LRB): VIDEO BRONCHOSCOPY (N/A) as a surgical intervention .  The patients' history has been reviewed, patient examined, no change in status, stable for surgery.  I have reviewed the patients' chart and labs.  Questions were answered to the patient's satisfaction.     [name]

## 2011-09-27 NOTE — Anesthesia Preprocedure Evaluation (Signed)
Anesthesia Evaluation  Patient identified by MRN, date of birth, ID band Patient awake    Reviewed: Allergy & Precautions, H&P , NPO status , Patient's Chart, lab work & pertinent test results  Airway Mallampati: II TM Distance: >3 FB Neck ROM: Full    Dental   Pulmonary shortness of breath, sleep apnea , COPD Lung ca, hemoptysis + rhonchi         Cardiovascular     Neuro/Psych  Neuromuscular disease    GI/Hepatic   Endo/Other  Diabetes mellitus-  Renal/GU      Musculoskeletal   Abdominal (+) + obese,   Peds  Hematology   Anesthesia Other Findings   Reproductive/Obstetrics                           Anesthesia Physical Anesthesia Plan  ASA: III  Anesthesia Plan: General   Post-op Pain Management:    Induction: Intravenous  Airway Management Planned: Oral ETT  Additional Equipment:   Intra-op Plan:   Post-operative Plan: Extubation in OR  Informed Consent: I have reviewed the patients History and Physical, chart, labs and discussed the procedure including the risks, benefits and alternatives for the proposed anesthesia with the patient or authorized representative who has indicated his/her understanding and acceptance.     Plan Discussed with: CRNA and Surgeon  Anesthesia Plan Comments:         Anesthesia Quick Evaluation

## 2011-09-27 NOTE — H&P (View-Only) (Signed)
HPI the patient has had 2 weeks of a low-grade hemoptysis. He's been treated with antibiotics for a left lower lobe pneumonia. His chest x-ray today still shows some infiltrate in the left lower lobe and questionable nodularity. The patient has undergone radiation and chemotherapy for a non-small cell lung cancer. He was on Arixtra but this was stopped 1 week ago the hemoptysis has continued to. We plan to do a fiberoptic bronchoscopy just to be sure that there is no endobronchial lesion causing his hemoptysis. We will do this on April 19 at [facility] as an outpatient. Risk of the procedure were explained to the patient. Patient agrees to the procedure   Current Outpatient Prescriptions  Medication Sig Dispense Refill  . cetirizine (ZYRTEC) 10 MG tablet Take 10 mg by mouth every morning.        . folic acid (FOLVITE) 1 MG tablet Take 1 mg by mouth every morning.       Marland Kitchen glimepiride (AMARYL) 2 MG tablet Take 2 mg by mouth daily as needed. Take when blood sugar is > 150       . glucosamine-chondroitin 500-400 MG tablet Take 2 tablets by mouth every morning.       . insulin lispro (HUMALOG) 100 UNIT/ML injection Inject into the skin. Use as directed.  BS 150-200  Take 2units BS 201-250  4 units. BS 251 - 300 6units. BS  301-350 8 units.      . metFORMIN (GLUCOPHAGE) 500 MG tablet Take 500 mg by mouth 2 (two) times daily.      . mirtazapine (REMERON) 7.5 MG tablet Take 1 tablet (7.5 mg total) by mouth at bedtime.  30 tablet  2  . moxifloxacin (AVELOX) 400 MG tablet Take 1 tablet (400 mg total) by mouth daily.  10 tablet  0  . pseudoephedrine-guaifenesin (MUCINEX D) 60-600 MG per tablet Take 1 tablet by mouth every 12 (twelve) hours. Patient used this medication for congestion.      . Psyllium (METAMUCIL PO) Take 2 capsules by mouth daily.      . simvastatin (ZOCOR) 40 MG tablet Take 40 mg by mouth at bedtime.       . solifenacin (VESICARE) 5 MG tablet Take 1 tablet (5 mg total) by mouth daily.  30  tablet  3  . zolpidem (AMBIEN) 10 MG tablet Take 10 mg by mouth at bedtime as needed. For sleep       . dexamethasone (DECADRON) 4 MG tablet Take 1 tab two times a day the day before Alimta chemo. Take 2 tabs two times a day starting the day after chemo for 3 days.  30 tablet  1  . fondaparinux (ARIXTRA) 10 MG/0.8ML SOLN Inject 0.8 mLs (10 mg total) into the skin at bedtime.  30 Syringe  6   No current facility-administered medications for this visit.   Facility-Administered Medications Ordered in Other Visits  Medication Dose Route Frequency Provider Last Rate Last Dose  . heparin lock flush 100 unit/mL  500 Units Intravenous Once Josph Macho, MD   500 Units at 09/24/11 1608  . DISCONTD: sodium chloride 0.9 % injection 10 mL  10 mL Intravenous PRN Josph Macho, MD   10 mL at 09/24/11 1030     Review of Systems: Hemoptysis recent dyspnea  Physical Exam lungs are clear to auscultation percussion chest x-ray shows improving infiltrate left lower lobe and a questionable area of nodularity   Diagnostic Tests:chest x-ray shows improving infiltrate left lower  lobe and a questionable area of nodularity     Impression: Stage IV non-small cell lung cancer the lungs hemoptysis   Plan:  Bronchoscopy

## 2011-09-27 NOTE — Anesthesia Procedure Notes (Signed)
Procedure Name: Intubation Date/Time: 09/27/2011 11:50 AM Performed by: Marni Griffon Pre-anesthesia Checklist: Patient identified, Emergency Drugs available, Suction available and Patient being monitored Patient Re-evaluated:Patient Re-evaluated prior to inductionPreoxygenation: Pre-oxygenation with 100% oxygen Intubation Type: IV induction Ventilation: Mask ventilation without difficulty Grade View: Grade II Tube type: Oral Tube size: 8.5 mm Number of attempts: 1 Airway Equipment and Method: Stylet Placement Confirmation: ETT inserted through vocal cords under direct vision,  breath sounds checked- equal and bilateral and positive ETCO2 Secured at: 22 (cm at teeth) cm Tube secured with: Tape Dental Injury: Teeth and Oropharynx as per pre-operative assessment

## 2011-09-27 NOTE — Op Note (Signed)
NAMEJESSE, NOSBISCH NO.:  [id number]  MEDICAL RECORD NO.:  [id number]  LOCATION:  [facility]                         FACILITY:  [facility]  PHYSICIAN:  Ines Bloomer, M.D. DATE OF BIRTH:  02-17-1925  DATE OF PROCEDURE: DATE OF DISCHARGE:  09/27/2011                              OPERATIVE REPORT   PREOPERATIVE DIAGNOSIS:  Hemoptysis status post radiation chemotherapy for stage 3A and 3B non-small cell lung cancer.  POSTOPERATIVE DIAGNOSIS:  Hemoptysis status post radiation chemotherapy for stage 3A and 3B non-small cell lung cancer.  OPERATION PERFORMED:  Video bronchoscopy.  DESCRIPTION OF PROCEDURE:  After general anesthesia, the video bronchoscope was passed through an endotracheal tube.  The carina was the midline.  The right upper lobe and right lower lobe orifices was normal.  The right lower lobe and bronchus intermedius orifices were normal; however, there was evidence of blood coming out of the right middle lobe, did not see any endobronchial lesions and did do some brushings from this area.  On a CT scan, there was some reaction distally in the right middle lobe.  We then went to the left side and left mainstem, left upper lobe orifices were normal and the medial basilar segment of the left lower lobe, there was some more evidence of some possible hemoptysis.  We did brushings from this area.  We removed the video bronchoscope of the patient.  We sent the washings for culture and cytology.  The patient tolerated the procedure well, was turned to recovery room in stable condition.     Ines Bloomer, M.D.     DPB/MEDQ  D:  09/27/2011  T:  09/27/2011  Job:  [id number]

## 2011-09-29 LAB — CULTURE, RESPIRATORY W GRAM STAIN: Culture: NO GROWTH

## 2011-09-30 ENCOUNTER — Encounter (HOSPITAL_COMMUNITY): Payer: Self-pay | Admitting: Thoracic Surgery

## 2011-10-01 ENCOUNTER — Ambulatory Visit (INDEPENDENT_AMBULATORY_CARE_PROVIDER_SITE_OTHER): Payer: Medicare Other | Admitting: Thoracic Surgery

## 2011-10-01 ENCOUNTER — Encounter: Payer: Self-pay | Admitting: Thoracic Surgery

## 2011-10-01 VITALS — BP 114/70 | HR 72 | Resp 22 | Ht 71.0 in | Wt 206.0 lb

## 2011-10-01 DIAGNOSIS — C3490 Malignant neoplasm of unspecified part of unspecified bronchus or lung: Secondary | ICD-10-CM

## 2011-10-01 DIAGNOSIS — Z9889 Other specified postprocedural states: Secondary | ICD-10-CM

## 2011-10-01 DIAGNOSIS — R042 Hemoptysis: Secondary | ICD-10-CM

## 2011-10-01 NOTE — Progress Notes (Signed)
HPI patient returns after bronchoscopy. Patient had a small amount of lung of bleeding in his right middle lobe medial segment and in the basilar segment and the left lower lobe. Brushings from these areas were negative. Cultures were also negative. He still is having some low-grade hemoptysis. I told him to continue to stay off the Arixtra. I think another short course of antibiotics might help even know he had negative cultures. I gave him a prescription for doxycycline 100 mg #20. I will see him back again to check on his amoxicillin 2 weeks with a chest x-ray.   Current Outpatient Prescriptions  Medication Sig Dispense Refill  . cetirizine (ZYRTEC) 10 MG tablet Take 10 mg by mouth every morning.       . folic acid (FOLVITE) 1 MG tablet Take 1 mg by mouth every morning.       Marland Kitchen glimepiride (AMARYL) 2 MG tablet Take 2 mg by mouth daily as needed. Take when blood sugar is > 150       . glucosamine-chondroitin 500-400 MG tablet Take 2 tablets by mouth every morning.       . insulin lispro (HUMALOG) 100 UNIT/ML injection Inject into the skin 3 (three) times daily as needed. Use as directed.  BS 150-200  Take 2units BS 201-250  4 units. BS 251 - 300 6units. BS  301-350 8 units.      . metFORMIN (GLUCOPHAGE) 500 MG tablet Take 500 mg by mouth 2 (two) times daily.      . Psyllium (METAMUCIL PO) Take 2 capsules by mouth daily as needed. For constipation      . simvastatin (ZOCOR) 40 MG tablet Take 40 mg by mouth at bedtime.       Marland Kitchen zolpidem (AMBIEN) 10 MG tablet Take 10 mg by mouth at bedtime as needed. For sleep       . fondaparinux (ARIXTRA) 10 MG/0.8ML SOLN Inject 0.8 mLs (10 mg total) into the skin at bedtime.  30 Syringe  6     Review of Systems: new onset hemoptysis   Physical Exam lungs are clear to auscultation and percussion   Diagnostic Tests: Bronchoscopy revealed evidence of bleeding the right middle lobe and left lower lobe   Impression: Hemoptysis probably inflammatory in  nature questionable bronchitis   Plan: Return in 2 weeks with chest

## 2011-10-04 ENCOUNTER — Encounter: Payer: Self-pay | Admitting: Radiation Oncology

## 2011-10-04 DIAGNOSIS — K5790 Diverticulosis of intestine, part unspecified, without perforation or abscess without bleeding: Secondary | ICD-10-CM | POA: Insufficient documentation

## 2011-10-04 DIAGNOSIS — J189 Pneumonia, unspecified organism: Secondary | ICD-10-CM | POA: Insufficient documentation

## 2011-10-05 NOTE — ED Provider Notes (Signed)
Evaluation and management procedures were performed by the PA/NP/resident physician under my supervision/collaboration.   Patrick Jayson D Akira Adelsberger, MD 10/05/11 1948 

## 2011-10-07 ENCOUNTER — Ambulatory Visit
Admission: RE | Admit: 2011-10-07 | Discharge: 2011-10-07 | Disposition: A | Discharge status: Home / Self Care | Payer: Medicare Other | Source: Ambulatory Visit | Attending: Radiation Oncology | Admitting: Radiation Oncology

## 2011-10-07 ENCOUNTER — Encounter: Payer: Self-pay | Admitting: Radiation Oncology

## 2011-10-07 VITALS — BP 105/66 | HR 69 | Temp 97.8°F | Wt 208.0 lb

## 2011-10-07 DIAGNOSIS — C3490 Malignant neoplasm of unspecified part of unspecified bronchus or lung: Secondary | ICD-10-CM

## 2011-10-07 NOTE — Progress Notes (Signed)
Here for routine follow up post radiation of lung.Had some bleeding in bilateral lungs.Bronchial washing performed by Dr.Burney and productive bleeding on coughing resolving. Mild shortness of breath.Ct and PET completed both reveal some improvement.

## 2011-10-07 NOTE — Progress Notes (Signed)
Radiation Oncology         [phone] ________________________________  Name: [name] MRN: [id number]  Date: 10/07/2011  DOB: [birth year]  Follow-Up Visit Note  CC: Hoyle Sauer, MD, MD  Josph Macho, MD  Diagnosis:   III-A nonsmall cell lung Cancer  Interval Since Last Radiation:  7 months  Narrative:  The patient returns today for routine follow-up.  He seems to be doing reasonably well at this time. the patient does have some dyspnea but is not requiring any supplemental oxygen. He has developed some mild hemoptysis. In light of the patient did see Dr. Edwyna Shell and a bronchoscopy showed no evidence of recurrent disease.    He has been on antibiotic therapy through Dr. Scheryl Darter office.                       ALLERGIES:   has no known allergies.  Meds: Current Outpatient Prescriptions  Medication Sig Dispense Refill  . cetirizine (ZYRTEC) 10 MG tablet Take 10 mg by mouth every morning.       . folic acid (FOLVITE) 1 MG tablet Take 1 mg by mouth every morning.       Marland Kitchen glimepiride (AMARYL) 2 MG tablet Take 2 mg by mouth daily as needed. Take when blood sugar is > 150       . glucosamine-chondroitin 500-400 MG tablet Take 2 tablets by mouth every morning.       . insulin lispro (HUMALOG) 100 UNIT/ML injection Inject into the skin 3 (three) times daily as needed. Use as directed.  BS 150-200  Take 2units BS 201-250  4 units. BS 251 - 300 6units. BS  301-350 8 units.      . metFORMIN (GLUCOPHAGE) 500 MG tablet Take 500 mg by mouth 2 (two) times daily.      . Psyllium (METAMUCIL PO) Take 2 capsules by mouth daily as needed. For constipation      . simvastatin (ZOCOR) 40 MG tablet Take 40 mg by mouth at bedtime.       Marland Kitchen zolpidem (AMBIEN) 10 MG tablet Take 10 mg by mouth at bedtime as needed. For sleep       . fondaparinux (ARIXTRA) 10 MG/0.8ML SOLN Inject 0.8 mLs (10 mg total) into the skin at bedtime.  30 Syringe  6    Physical Findings: The patient is in no acute  distress. Patient is alert and oriented.  weight is 208 lb (94.348 kg). His temperature is 97.8 F (36.6 C). His blood pressure is 105/66 and his pulse is 69. .  No significant changes. No palpable cervical supraclavicular or axillary adenopathy. Patient has mild bile basilar crackles.  Lab Findings: Lab Results  Component Value Date   WBC 4.8 09/27/2011   HGB 12.2* 09/27/2011   HCT 36.7* 09/27/2011   MCV 86.6 09/27/2011   PLT 155 09/27/2011    @LASTCHEM @  Radiographic Findings: Dg Chest 2 View  09/25/2011  *RADIOLOGY REPORT*  Clinical Data: History of lung carcinoma, chemotherapy  CHEST - 2 VIEW  Comparison: CT chest of 09/16/2011 and chest x-ray of the same date  Findings: The patchy airspace disease in the left lower lobe has completely cleared. The more solid appearing area within the anterolateral left lower lobe by CT appears to persist overlying the costophrenic angle and tumor would be difficult to exclude. The lungs remain hyperaerated consistent with COPD.  Peribronchial thickening is noted.  Mild cardiomegaly is stable.  A left-sided Port-A-Cath  remains with the tip seen to the lower SVC.  The bones are osteopenic.  IMPRESSION:  1.  Incomplete clearing of left lower lobe airspace disease. 2. Rounded opacity remains at the left costophrenic angle.  Tumor cannot be excluded. 3.  COPD.  Original Report Authenticated By: Juline Patch, M.D.   Dg Chest 2 View  09/16/2011  *RADIOLOGY REPORT*  Clinical Data: Hemoptysis.  History of lung cancer.  CHEST - 2 VIEW  Comparison: Chest radiograph 04/23/2011 and 04/22/2011.  Chest CT 08/03/2010.  Findings: Left subclavian Port-A-Cath terminates in the superior vena cava.  Heart, mediastinal, and hilar contours are stable. Paratracheal contour appears within normal limits, and stable compared to 04/23/2011.  There is a new patchy opacity with peribronchial thickening in the left lower lobe.  This is new compared to prior radiographs of November 2012.  The  right costophrenic angle is blunted.  No focal opacities in the right lung.  IMPRESSION: New airspace disease and peribronchial thickening in the left lower lobe is suspicious for pneumonia.  Otherwise, stable examination.  This is made a call report.  Original Report Authenticated By: Britta Mccreedy, M.D.   Ct Angio Chest W/cm &/or Wo Cm  09/16/2011  *RADIOLOGY REPORT*  Clinical Data: Worsening shortness of breath.  History of lung cancer and COPD.  Evaluate for pulmonary embolism.  CT ANGIOGRAPHY CHEST  Technique:  Multidetector CT imaging of the chest using the standard protocol during bolus administration of intravenous contrast. Multiplanar reconstructed images including MIPs were obtained and reviewed to evaluate the vascular anatomy.  Contrast: 80mL OMNIPAQUE IOHEXOL 350 MG/ML SOLN  Comparison: PET CT 08/07/2011.  Chest CT 04/03/2011.  Findings: Contrast injection was via the right arm.  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  The pulmonary arteries are well opacified with contrast.  There is no evidence of acute pulmonary embolism.  There is stable diffuse aortic atherosclerosis and tortuosity.  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  A right paratracheal soft tissue nodule measuring 2.3 x 1.4 cm on image 27 has not significantly changed.  There are additional prominent lymph nodes within the AP window, right hilum and right infrahilar regions which are also stable.  There is no progressive adenopathy.  A small pericardial effusion has enlarged.  There is no significant pleural effusion.  There has been interval development of patchy airspace opacities anteriorly within the right middle lobe and in the left lower lobe.  There is a more confluent airspace opacity or ill-defined mass within the left lower lobe abutting the major fissure.  This measures up to 3.1 x 3.4 cm on image 76.  Since there was  no abnormality in this area on the PET CT for performed approximately 6 weeks ago, this is likely an area of infection or inflammation.  IMPRESSION:  1.  No evidence of acute pulmonary embolism. 2.  New patchy airspace opacities in the right middle and left lower lobes with more confluent left lower lobe density, likely pneumonia or focal radiation pneumonitis. 3.  Enlarging small pericardial effusion. 4.  Stable mediastinal and right hilar lymph nodes.  Original Report Authenticated By: Gerrianne Scale, M.D.    Impression:  The patient is recovering from the effects of radiation.  Clinically stable except for above-mentioned issues. Patient has no obvious signs of recurrence at this time. Given the patient's close followup with Dr. Edwyna Shell and Dr. Myna Hidalgo I  have not scheduled Mr. Chopin for formal followup appointments but would be glad to see him anytime.  Plan:  Prn followup  _____________________________________  Billie Lade, M.D.

## 2011-10-11 ENCOUNTER — Other Ambulatory Visit: Payer: Self-pay | Admitting: Thoracic Surgery

## 2011-10-11 DIAGNOSIS — R042 Hemoptysis: Secondary | ICD-10-CM

## 2011-10-16 ENCOUNTER — Ambulatory Visit
Admission: RE | Admit: 2011-10-16 | Discharge: 2011-10-16 | Disposition: A | Discharge status: Home / Self Care | Payer: Medicare Other | Source: Ambulatory Visit | Attending: Thoracic Surgery | Admitting: Thoracic Surgery

## 2011-10-16 ENCOUNTER — Ambulatory Visit: Payer: Medicare Other | Admitting: Thoracic Surgery

## 2011-10-16 DIAGNOSIS — R042 Hemoptysis: Secondary | ICD-10-CM

## 2011-10-17 ENCOUNTER — Ambulatory Visit (INDEPENDENT_AMBULATORY_CARE_PROVIDER_SITE_OTHER): Payer: Medicare Other | Admitting: Thoracic Surgery

## 2011-10-17 ENCOUNTER — Encounter: Payer: Self-pay | Admitting: Thoracic Surgery

## 2011-10-17 ENCOUNTER — Other Ambulatory Visit (HOSPITAL_BASED_OUTPATIENT_CLINIC_OR_DEPARTMENT_OTHER): Payer: Medicare Other

## 2011-10-17 VITALS — BP 114/77 | HR 76 | Resp 18 | Ht 71.0 in | Wt 206.0 lb

## 2011-10-17 DIAGNOSIS — R042 Hemoptysis: Secondary | ICD-10-CM

## 2011-10-17 DIAGNOSIS — C3490 Malignant neoplasm of unspecified part of unspecified bronchus or lung: Secondary | ICD-10-CM

## 2011-10-17 DIAGNOSIS — Z9889 Other specified postprocedural states: Secondary | ICD-10-CM

## 2011-10-17 NOTE — Progress Notes (Signed)
HPI is returns for followup. Chest x-ray shows improvement in the left lower lobe process. He's had no hemoptysis for one week. I told him to continue to stay off his Arixtra. He has completed his course of antibiotics. Will see Dr. Myna Hidalgo in 2 weeks. His lungs were clear to auscultation percussion. I will see him back again as needed   Current Outpatient Prescriptions  Medication Sig Dispense Refill  . cetirizine (ZYRTEC) 10 MG tablet Take 10 mg by mouth every morning.       . folic acid (FOLVITE) 1 MG tablet Take 1 mg by mouth every morning.       Marland Kitchen glimepiride (AMARYL) 2 MG tablet Take 2 mg by mouth daily as needed. Take when blood sugar is > 150       . glucosamine-chondroitin 500-400 MG tablet Take 2 tablets by mouth every morning.       . insulin lispro (HUMALOG) 100 UNIT/ML injection Inject into the skin 3 (three) times daily as needed. Use as directed.  BS 150-200  Take 2units BS 201-250  4 units. BS 251 - 300 6units. BS  301-350 8 units.      . metFORMIN (GLUCOPHAGE) 500 MG tablet Take 500 mg by mouth 2 (two) times daily.      . Psyllium (METAMUCIL PO) Take 2 capsules by mouth daily as needed. For constipation      . simvastatin (ZOCOR) 40 MG tablet Take 40 mg by mouth at bedtime.       Marland Kitchen zolpidem (AMBIEN) 10 MG tablet Take 10 mg by mouth at bedtime as needed. For sleep       . fondaparinux (ARIXTRA) 10 MG/0.8ML SOLN Inject 0.8 mLs (10 mg total) into the skin at bedtime.  30 Syringe  6     Review of Systems: Resolution no hemoptysis   Physical Exam lungs are clear to auscultation percussion    Diagnostic Tests: Chest x-ray shows resolution of left lower lobe infiltrate   Impression: Hemoptysis resolved status post non-small cell lung cancer stage IIIa status post radiation and chemotherapy    Plan: Return as needed

## 2011-10-29 ENCOUNTER — Ambulatory Visit (HOSPITAL_BASED_OUTPATIENT_CLINIC_OR_DEPARTMENT_OTHER): Payer: Medicare Other | Admitting: Hematology & Oncology

## 2011-10-29 ENCOUNTER — Other Ambulatory Visit (HOSPITAL_BASED_OUTPATIENT_CLINIC_OR_DEPARTMENT_OTHER): Payer: Medicare Other | Admitting: Lab

## 2011-10-29 ENCOUNTER — Ambulatory Visit: Payer: Medicare Other

## 2011-10-29 VITALS — BP 96/60 | HR 75 | Temp 96.6°F | Ht 71.0 in | Wt 209.0 lb

## 2011-10-29 DIAGNOSIS — C3490 Malignant neoplasm of unspecified part of unspecified bronchus or lung: Secondary | ICD-10-CM

## 2011-10-29 DIAGNOSIS — I82B19 Acute embolism and thrombosis of unspecified subclavian vein: Secondary | ICD-10-CM

## 2011-10-29 DIAGNOSIS — C3410 Malignant neoplasm of upper lobe, unspecified bronchus or lung: Secondary | ICD-10-CM

## 2011-10-29 DIAGNOSIS — G4701 Insomnia due to medical condition: Secondary | ICD-10-CM

## 2011-10-29 DIAGNOSIS — F22 Delusional disorders: Secondary | ICD-10-CM

## 2011-10-29 DIAGNOSIS — I82409 Acute embolism and thrombosis of unspecified deep veins of unspecified lower extremity: Secondary | ICD-10-CM

## 2011-10-29 LAB — CBC WITH DIFFERENTIAL (CANCER CENTER ONLY)
BASO#: 0 10*3/uL (ref 0.0–0.2)
Eosinophils Absolute: 0.2 10*3/uL (ref 0.0–0.5)
HCT: 40.2 % (ref 38.7–49.9)
HGB: 13.2 g/dL (ref 13.0–17.1)
LYMPH%: 18.7 % (ref 14.0–48.0)
MCH: 28 pg (ref 28.0–33.4)
MCV: 85 fL (ref 82–98)
MONO#: 0.7 10*3/uL (ref 0.1–0.9)
NEUT%: 65.2 % (ref 40.0–80.0)
Platelets: 153 10*3/uL (ref 145–400)
RBC: 4.71 10*6/uL (ref 4.20–5.70)
WBC: 5.2 10*3/uL (ref 4.0–10.0)

## 2011-10-29 MED ORDER — SODIUM CHLORIDE 0.9 % IJ SOLN
10.0000 mL | INTRAMUSCULAR | Status: DC | PRN
  Administered 2011-10-29: 10 mL via INTRAVENOUS
  Filled 2011-10-29: qty 10

## 2011-10-29 MED ORDER — ZOLPIDEM TARTRATE 10 MG PO TABS: 10.0000 mg | ORAL_TABLET | Freq: Every evening | ORAL | Status: DC | PRN

## 2011-10-29 MED ORDER — HEPARIN SOD (PORK) LOCK FLUSH 100 UNIT/ML IV SOLN
500.0000 [IU] | Freq: Once | INTRAVENOUS | Status: AC
  Administered 2011-10-29: 500 [IU] via INTRAVENOUS
  Filled 2011-10-29: qty 5

## 2011-10-29 NOTE — Progress Notes (Signed)
This office note has been dictated.

## 2011-10-29 NOTE — Progress Notes (Signed)
CC:   Larina Earthly, M.D. Ines Bloomer, M.D.  DIAGNOSES: 1. 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 2. 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  CURRENT THERAPY:  The patient to restart Arixtra 10 mg subcu daily.  INTERIM HISTORY:  [name] comes in for his followup.  He is looking better.  He is feeling better.  He has no further hemoptysis.  He was seen by Dr. Dewayne Shorter.  Dr. Edwyna Shell went ahead and did a bronchoscopy on him.  The bronchoscopy did not show any evidence of endoluminal recurrence.  He did not see any source for bleeding.  [name] has been off his Arixtra now for over a month.  I really think we need to try to get him back on the Arixtra.  I believe that he is hypercoagulable from his past malignancy.  I want to try to get him through July on the Arixtra.  He has not had any problems with his appetite.  He is eating well.  He has more stamina now.  PHYSICAL EXAM:  General:  This is a well-developed, well-nourished white gentleman in no obvious distress.  Vital signs:  Temperature of 96.6, pulse 75, respiratory rate 20, blood pressure 96/60.  Weight is 209. Head and neck:  Shows a normocephalic, atraumatic skull.  There are no ocular or oral lesions.  There are no palpable cervical or supraclavicular lymph nodes.  Lungs:  Clear bilaterally.  Cardiac: Regular rate and rhythm with a normal S1 and S2.  There are no murmurs, rubs or bruits.  Abdomen:  Soft with good bowel sounds.  There is no palpable abdominal mass.  There is no palpable hepatosplenomegaly. Extremities:  Show no clubbing, cyanosis or edema.  There is no lymphedema in the right arm.  Neurologic:  Exam shows no focal neurological deficits.  LABORATORY STUDIES:  White cell count is 5.2, hemoglobin 13.2, hematocrit 40.2, platelet count 153.  IMPRESSION:  [name] is an 78 year old gentleman with history of locally advanced/inoperable adenocarcinoma of the right lung.  He  has stage IIIB disease.  He was treated with chemo/radiation therapy.  He then received full-dose chemotherapy.  He presented with the subclavian vein thrombus.  This resolved as he was treated and he is being placed on anticoagulation.  I think we do have to set him up with another CT scan.  We will get this set up in July.  We will plan to get [name] back afterwards for followup.    ______________________________ Josph Macho, M.D. PRE/MEDQ  D:  10/29/2011  T:  10/29/2011  Job:  [id number]

## 2011-10-30 ENCOUNTER — Other Ambulatory Visit: Payer: Self-pay | Admitting: *Deleted

## 2011-10-30 DIAGNOSIS — G4701 Insomnia due to medical condition: Secondary | ICD-10-CM

## 2011-10-30 LAB — D-DIMER, QUANTITATIVE: D-Dimer, Quant: 1.16 ug/mL-FEU — ABNORMAL HIGH (ref 0.00–0.48)

## 2011-10-30 MED ORDER — ZOLPIDEM TARTRATE 10 MG PO TABS: ORAL_TABLET | ORAL | Status: DC

## 2011-10-30 NOTE — Telephone Encounter (Signed)
Received a call from the pt's pharmacy. Clarified Ambien rx to 10 mg 1/2 tab nightly as needed.

## 2011-11-07 ENCOUNTER — Encounter: Payer: Self-pay | Admitting: *Deleted

## 2011-11-07 NOTE — Progress Notes (Signed)
Mr. Zehren called to report he had been coughing up mucus with blood in it about q2H since 10/30/11.  Reports that on 11/01/11 he stopped his Arixtra on his own.  Mucus/blood did not stop.  Per Dr. Myna Hidalgo, pt to stop Arixtra and begin taking 2 baby asprins/day.  Spoke with pt's daughter and she voiced understanding.

## 2011-11-10 LAB — AFB CULTURE WITH SMEAR (NOT AT ARMC)

## 2011-12-13 ENCOUNTER — Ambulatory Visit: Payer: Medicare Other

## 2011-12-13 ENCOUNTER — Ambulatory Visit (HOSPITAL_BASED_OUTPATIENT_CLINIC_OR_DEPARTMENT_OTHER)
Admission: RE | Admit: 2011-12-13 | Discharge: 2011-12-13 | Disposition: A | Discharge status: Home / Self Care | Payer: Medicare Other | Source: Ambulatory Visit | Attending: Hematology & Oncology | Admitting: Hematology & Oncology

## 2011-12-13 ENCOUNTER — Other Ambulatory Visit (HOSPITAL_BASED_OUTPATIENT_CLINIC_OR_DEPARTMENT_OTHER): Payer: Medicare Other | Admitting: Lab

## 2011-12-13 VITALS — BP 104/66 | HR 62 | Temp 96.8°F

## 2011-12-13 DIAGNOSIS — I82409 Acute embolism and thrombosis of unspecified deep veins of unspecified lower extremity: Secondary | ICD-10-CM

## 2011-12-13 DIAGNOSIS — C3490 Malignant neoplasm of unspecified part of unspecified bronchus or lung: Secondary | ICD-10-CM

## 2011-12-13 DIAGNOSIS — E049 Nontoxic goiter, unspecified: Secondary | ICD-10-CM | POA: Insufficient documentation

## 2011-12-13 DIAGNOSIS — I319 Disease of pericardium, unspecified: Secondary | ICD-10-CM | POA: Insufficient documentation

## 2011-12-13 DIAGNOSIS — G4701 Insomnia due to medical condition: Secondary | ICD-10-CM

## 2011-12-13 LAB — CMP (CANCER CENTER ONLY)
ALT(SGPT): 15 U/L (ref 10–47)
Alkaline Phosphatase: 84 U/L (ref 26–84)
CO2: 30 mEq/L (ref 18–33)
Sodium: 137 mEq/L (ref 128–145)
Total Bilirubin: 0.7 mg/dl (ref 0.20–1.60)
Total Protein: 8.9 g/dL — ABNORMAL HIGH (ref 6.4–8.1)

## 2011-12-13 LAB — CBC WITH DIFFERENTIAL (CANCER CENTER ONLY)
BASO%: 0.2 % (ref 0.0–2.0)
LYMPH#: 0.9 10*3/uL (ref 0.9–3.3)
LYMPH%: 20.3 % (ref 14.0–48.0)
MCV: 86 fL (ref 82–98)
MONO#: 0.5 10*3/uL (ref 0.1–0.9)
Platelets: 141 10*3/uL — ABNORMAL LOW (ref 145–400)
RDW: 18.3 % — ABNORMAL HIGH (ref 11.1–15.7)
WBC: 4.6 10*3/uL (ref 4.0–10.0)

## 2011-12-13 LAB — D-DIMER, QUANTITATIVE: D-Dimer, Quant: 1.54 ug/mL-FEU — ABNORMAL HIGH (ref 0.00–0.48)

## 2011-12-13 MED ORDER — SODIUM CHLORIDE 0.9 % IJ SOLN
10.0000 mL | INTRAMUSCULAR | Status: DC | PRN
  Administered 2011-12-13: 10 mL via INTRAVENOUS
  Filled 2011-12-13: qty 10

## 2011-12-13 MED ORDER — HEPARIN SOD (PORK) LOCK FLUSH 100 UNIT/ML IV SOLN
500.0000 [IU] | Freq: Once | INTRAVENOUS | Status: AC
  Administered 2011-12-13: 500 [IU] via INTRAVENOUS
  Filled 2011-12-13: qty 5

## 2011-12-13 MED ORDER — IOHEXOL 300 MG/ML  SOLN
80.0000 mL | Freq: Once | INTRAMUSCULAR | Status: AC | PRN
  Administered 2011-12-13: 80 mL via INTRAVENOUS

## 2011-12-13 NOTE — Patient Instructions (Signed)

## 2011-12-20 ENCOUNTER — Ambulatory Visit (HOSPITAL_BASED_OUTPATIENT_CLINIC_OR_DEPARTMENT_OTHER): Payer: Medicare Other | Admitting: Hematology & Oncology

## 2011-12-20 ENCOUNTER — Ambulatory Visit: Payer: Medicare Other

## 2011-12-20 ENCOUNTER — Other Ambulatory Visit (HOSPITAL_BASED_OUTPATIENT_CLINIC_OR_DEPARTMENT_OTHER): Payer: Medicare Other | Admitting: Lab

## 2011-12-20 VITALS — BP 107/70 | HR 75 | Temp 96.9°F | Wt 212.0 lb

## 2011-12-20 DIAGNOSIS — I82409 Acute embolism and thrombosis of unspecified deep veins of unspecified lower extremity: Secondary | ICD-10-CM

## 2011-12-20 DIAGNOSIS — C3490 Malignant neoplasm of unspecified part of unspecified bronchus or lung: Secondary | ICD-10-CM

## 2011-12-20 DIAGNOSIS — C3410 Malignant neoplasm of upper lobe, unspecified bronchus or lung: Secondary | ICD-10-CM

## 2011-12-20 NOTE — Progress Notes (Signed)
CC:   Larina Earthly, M.D.  DIAGNOSES: 1. 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 2. Deep vein thrombosis of the right subclavian vein.  CURRENT THERAPY:  Aspirin at 162 mg p.o. daily.  INTERIM HISTORY:  [name] comes in for followup.  He really looks wonderful.  This is probably the best I have seen him look in over 6 months.  He is off Arixtra now.  He was having some issues with respect to bleeding while on the Arixtra.  He did bronchoscopy.  Bronchoscopy was negative for any endobronchial lesions.  As he has been on anticoagulation for a year, I felt that we could probably get him onto aspirin for right now.  We did go ahead and repeat his CT scan.  This was a CT of the chest.  It was done on July 5th.  The CT scan did not show any evidence of recurrent or progressive disease.  He did have a right paratracheal lesion which now measures 2.2 x 1.6 cm.  No new lymphadenopathy was appreciated.  There was no growth of any lymph nodes.  He did have some emphysematous changes.  He had resolution of airspace disease in the right middle lung.  Also noted was a left lower lobe opacity which has gone completely.  Bone windows look normal with no lytic or sclerotic lesions.  Upper abdominal pictures did not show any obvious recurrence in the upper abdomen/liver.  His performance status probably is ECOG 1 now.  He just has more activity.  He is eating better.  He is swallowing well.  He has taste for food.  PHYSICAL EXAMINATION:  This is an elderly but well-nourished white gentleman in no obvious distress.  Vital signs:  96.9, pulse 75, respiratory rate 18, blood pressure 107/70.  Weight is 212.  Head and neck:  Normocephalic, atraumatic skull.  There are no ocular or oral lesions.  There are no palpable cervical or supraclavicular lymph nodes. Lungs:  Clear to percussion and auscultation bilaterally.  Cardiac: Regular rate  and rhythm with a normal S1 and S2.  There are no  murmurs, rubs or bruits.  Abdomen:  Soft with good bowel sounds.  There is no palpable abdominal mass.  There is no fluid wave.  There is no palpable hepatosplenomegaly.  Back:  No tenderness over the spine, ribs, or hips. Extremities:  Some slight nonpitting edema of the right arm.  He has no plethora or stasis changes in his right arm.  He has good range motion of the joints.  He has good pulses in his distal extremities. Neurological:  No focal neurological deficits.  Laboratory studies were not done this visit.  IMPRESSION:  [name] is a real nice 78 year old white gentleman.  He is incredibly stout.  He presented almost a year ago with his inoperable locally-advanced lung cancer.  He has had successful treatment to date. So far I do not see any evidence of recurrence.  Unfortunately, he does have a significant risk of recurrence.  As such, we need to follow up with another CT scan in about 3 or 4 months.  We will get this set up for him in the fall.  I will see him back after his scans are done.  He will get his Port-A-Cath flushed today.  He will come back in 6 weeks to have it flushed again.    ______________________________ Josph Macho, M.D. PRE/MEDQ  D:  12/20/2011  T:  12/20/2011  Job:  [id number] 

## 2011-12-20 NOTE — Progress Notes (Signed)
Mr. Longest was flushed last week, therefore, will reschedule for 6 weeks. Teola Bradley, Chyane Greer [employer]

## 2011-12-20 NOTE — Progress Notes (Signed)
This office note has been dictated.

## 2012-01-31 ENCOUNTER — Ambulatory Visit (HOSPITAL_BASED_OUTPATIENT_CLINIC_OR_DEPARTMENT_OTHER): Payer: Medicare Other

## 2012-01-31 VITALS — BP 105/64 | HR 63 | Temp 96.8°F | Resp 18

## 2012-01-31 DIAGNOSIS — C3410 Malignant neoplasm of upper lobe, unspecified bronchus or lung: Secondary | ICD-10-CM

## 2012-01-31 DIAGNOSIS — C3490 Malignant neoplasm of unspecified part of unspecified bronchus or lung: Secondary | ICD-10-CM

## 2012-01-31 DIAGNOSIS — Z452 Encounter for adjustment and management of vascular access device: Secondary | ICD-10-CM

## 2012-01-31 MED ORDER — ALTEPLASE 2 MG IJ SOLR
2.0000 mg | Freq: Once | INTRAMUSCULAR | Status: DC | PRN
  Filled 2012-01-31: qty 2

## 2012-01-31 MED ORDER — SODIUM CHLORIDE 0.9 % IJ SOLN
10.0000 mL | INTRAMUSCULAR | Status: DC | PRN
  Administered 2012-01-31: 10 mL via INTRAVENOUS
  Filled 2012-01-31: qty 10

## 2012-01-31 MED ORDER — HEPARIN SOD (PORK) LOCK FLUSH 100 UNIT/ML IV SOLN
500.0000 [IU] | Freq: Once | INTRAVENOUS | Status: AC | PRN
  Administered 2012-01-31: 500 [IU] via INTRAVENOUS
  Filled 2012-01-31: qty 5

## 2012-01-31 NOTE — Progress Notes (Signed)
[name] presented for Portacath access and flush. Proper placement of portacath confirmed by CXR. Portacath located in the left chest wall accessed with  H 20 needle. Clean, Dry and Intact Good blood return present. Portacath flushed with 20ml NS and 500U/5ml Heparin per protocol and needle removed intact. Procedure without incident. Patient tolerated procedure well.   

## 2012-03-20 ENCOUNTER — Ambulatory Visit (HOSPITAL_BASED_OUTPATIENT_CLINIC_OR_DEPARTMENT_OTHER)
Admission: RE | Admit: 2012-03-20 | Discharge: 2012-03-20 | Disposition: A | Discharge status: Home / Self Care | Payer: Medicare Other | Source: Ambulatory Visit | Attending: Hematology & Oncology | Admitting: Hematology & Oncology

## 2012-03-20 ENCOUNTER — Other Ambulatory Visit (HOSPITAL_BASED_OUTPATIENT_CLINIC_OR_DEPARTMENT_OTHER): Payer: Medicare Other | Admitting: Lab

## 2012-03-20 ENCOUNTER — Ambulatory Visit (HOSPITAL_BASED_OUTPATIENT_CLINIC_OR_DEPARTMENT_OTHER): Payer: Medicare Other

## 2012-03-20 VITALS — BP 114/69 | HR 65 | Temp 96.8°F | Resp 20

## 2012-03-20 DIAGNOSIS — K8020 Calculus of gallbladder without cholecystitis without obstruction: Secondary | ICD-10-CM | POA: Insufficient documentation

## 2012-03-20 DIAGNOSIS — C3490 Malignant neoplasm of unspecified part of unspecified bronchus or lung: Secondary | ICD-10-CM

## 2012-03-20 DIAGNOSIS — J90 Pleural effusion, not elsewhere classified: Secondary | ICD-10-CM | POA: Insufficient documentation

## 2012-03-20 DIAGNOSIS — I82409 Acute embolism and thrombosis of unspecified deep veins of unspecified lower extremity: Secondary | ICD-10-CM

## 2012-03-20 DIAGNOSIS — Z09 Encounter for follow-up examination after completed treatment for conditions other than malignant neoplasm: Secondary | ICD-10-CM | POA: Insufficient documentation

## 2012-03-20 DIAGNOSIS — C3410 Malignant neoplasm of upper lobe, unspecified bronchus or lung: Secondary | ICD-10-CM

## 2012-03-20 DIAGNOSIS — Z452 Encounter for adjustment and management of vascular access device: Secondary | ICD-10-CM

## 2012-03-20 DIAGNOSIS — I2510 Atherosclerotic heart disease of native coronary artery without angina pectoris: Secondary | ICD-10-CM | POA: Insufficient documentation

## 2012-03-20 LAB — CMP (CANCER CENTER ONLY)
AST: 19 U/L (ref 11–38)
Alkaline Phosphatase: 84 U/L (ref 26–84)
Glucose, Bld: 169 mg/dL — ABNORMAL HIGH (ref 73–118)
Sodium: 139 mEq/L (ref 128–145)
Total Bilirubin: 0.6 mg/dl (ref 0.20–1.60)
Total Protein: 6.8 g/dL (ref 6.4–8.1)

## 2012-03-20 LAB — CBC WITH DIFFERENTIAL (CANCER CENTER ONLY)
BASO#: 0 10*3/uL (ref 0.0–0.2)
BASO%: 0.4 % (ref 0.0–2.0)
Eosinophils Absolute: 0.1 10*3/uL (ref 0.0–0.5)
HCT: 42 % (ref 38.7–49.9)
HGB: 14.5 g/dL (ref 13.0–17.1)
LYMPH#: 1 10*3/uL (ref 0.9–3.3)
MCV: 88 fL (ref 82–98)
MONO#: 0.5 10*3/uL (ref 0.1–0.9)
NEUT%: 69.5 % (ref 40.0–80.0)
RBC: 4.75 10*6/uL (ref 4.20–5.70)
RDW: 14 % (ref 11.1–15.7)
WBC: 5.1 10*3/uL (ref 4.0–10.0)

## 2012-03-20 MED ORDER — IOHEXOL 350 MG/ML SOLN: 100.0000 mL | Freq: Once | INTRAVENOUS | Status: AC | PRN

## 2012-03-20 MED ORDER — SODIUM CHLORIDE 0.9 % IJ SOLN
10.0000 mL | INTRAMUSCULAR | Status: DC | PRN
  Administered 2012-03-20: 10 mL via INTRAVENOUS
  Filled 2012-03-20: qty 10

## 2012-03-20 MED ORDER — HEPARIN SOD (PORK) LOCK FLUSH 100 UNIT/ML IV SOLN
500.0000 [IU] | Freq: Once | INTRAVENOUS | Status: AC | PRN
  Administered 2012-03-20: 500 [IU] via INTRAVENOUS
  Filled 2012-03-20: qty 5

## 2012-03-20 NOTE — Patient Instructions (Signed)

## 2012-03-27 ENCOUNTER — Ambulatory Visit (HOSPITAL_BASED_OUTPATIENT_CLINIC_OR_DEPARTMENT_OTHER): Payer: Medicare Other | Admitting: Hematology & Oncology

## 2012-03-27 VITALS — BP 103/56 | HR 64 | Temp 97.5°F | Resp 20 | Ht 71.0 in | Wt 227.0 lb

## 2012-03-27 DIAGNOSIS — C3490 Malignant neoplasm of unspecified part of unspecified bronchus or lung: Secondary | ICD-10-CM

## 2012-03-27 DIAGNOSIS — I82B19 Acute embolism and thrombosis of unspecified subclavian vein: Secondary | ICD-10-CM

## 2012-03-27 DIAGNOSIS — I82409 Acute embolism and thrombosis of unspecified deep veins of unspecified lower extremity: Secondary | ICD-10-CM

## 2012-03-27 DIAGNOSIS — C3410 Malignant neoplasm of upper lobe, unspecified bronchus or lung: Secondary | ICD-10-CM

## 2012-03-27 NOTE — Progress Notes (Signed)
CC:   Larina Earthly, M.D.  DIAGNOSES: 1. 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 2. Deep venous thrombosis (DVT) of the right subclavian vein.  CURRENT THERAPY:  Aspirin 162 mg p.o. daily.  INTERIM HISTORY:  Mr. Shatswell comes in for followup.  He continues to look incredibly well.  It has been about, I think, 10 months since he completed his chemoradiation therapy.  He actually completed his chemotherapy back in January 2013.  We did go ahead and repeat his scans.  They were done on 03/20/2012. The CT scan showed continued improvement within the mediastinum.  There was continued decrease in the soft tissue in the right peritracheal station.  There is no evidence to suggest recurrent disease.  There was a new small right pleural effusion without definite abnormalities. There was no evidence of thrombus noted within the subclavian vein. There was some atherosclerosis noted.  Mr. Penson is eating a lot better.  His weight has gone up 15 pounds since we last saw him.  He has had no cough.  He has had no shortness of breath.  He has had no fever.  He has had no leg swelling.  He has had no bleeding.  There has been no headache.  PHYSICAL EXAMINATION:  General:  This is a well-developed, well- nourished white gentleman, in no obvious distress.  Vital signs:  97.5, pulse 65, respiratory rate 18, blood pressure 100/58.  Weight is 227. Head and neck:  Normocephalic, atraumatic skull.  There are no ocular or oral lesions.  There are no palpable cervical or supraclavicular lymph nodes.  Lungs:  Clear bilaterally.  Cardiac:  Regular rate and rhythm, with a normal S1 and S2.  There are no murmurs, rubs, or bruits. Abdomen:  Soft, with good bowel sounds.  There is no palpable abdominal mass.  There is no palpable hepatosplenomegaly.  Back:  No tenderness of the spine, ribs, or hips.  Extremities:  No clubbing, cyanosis, or edema.  Neurological:  No focal neurological  deficits.  LABORATORY STUDIES:  White cell count of 5.1, hemoglobin 14.5, hematocrit 42, platelet count 142,000.  His BUN and creatinine were 17 and 1.0.  Liver function tests were normal.  LDH is 146.  IMPRESSION:  Mr. Dieguez is a really nice 78 year old gentleman with a history of stage IIIB adenocarcinoma of the right lung.  He is not an operative candidate.  He was treated with chemo/radiation therapy followed by chemotherapy.  From the beginning he was an incredibly stout guy.  He handled treatment very nicely.  He was treated with Arixtra for his right subclavian vein thrombus.  He received a year of anticoagulation.  He now is on aspirin.  We still have to follow him along.  There is still a risk of recurrence that we are going to have to be careful with.  We will set him up with scans in about 4 months now.  He will come back in 2 months or so for a Port-A-Cath flush.  His wife was with him.  I saw her.  Unfortunately, she has begun to bleed again.    ______________________________ Josph Macho, M.D. PRE/MEDQ  D:  03/27/2012  T:  03/27/2012  Job:  [id number]

## 2012-03-27 NOTE — Progress Notes (Signed)
This office note has been dictated.

## 2012-03-30 ENCOUNTER — Telehealth: Payer: Self-pay | Admitting: Hematology & Oncology

## 2012-03-30 NOTE — Telephone Encounter (Signed)
Pt aware of 12-20 flush. He is aware of 2-14 CT to pick up contrast ans instructions. He is also aware of 2-21 MD appointment

## 2012-04-15 ENCOUNTER — Telehealth: Payer: Self-pay | Admitting: Hematology & Oncology

## 2012-04-15 NOTE — Telephone Encounter (Signed)
Pt aware of 11-26 flush appointment and has concerns about drinking contrast before CT and will ask RN when he comes for his flush

## 2012-05-05 ENCOUNTER — Ambulatory Visit (HOSPITAL_BASED_OUTPATIENT_CLINIC_OR_DEPARTMENT_OTHER): Payer: Medicare Other

## 2012-05-05 VITALS — BP 110/60 | HR 60 | Temp 97.0°F | Resp 20

## 2012-05-05 DIAGNOSIS — C3490 Malignant neoplasm of unspecified part of unspecified bronchus or lung: Secondary | ICD-10-CM

## 2012-05-05 DIAGNOSIS — Z452 Encounter for adjustment and management of vascular access device: Secondary | ICD-10-CM

## 2012-05-05 DIAGNOSIS — C3410 Malignant neoplasm of upper lobe, unspecified bronchus or lung: Secondary | ICD-10-CM

## 2012-05-05 MED ORDER — HEPARIN SOD (PORK) LOCK FLUSH 100 UNIT/ML IV SOLN
500.0000 [IU] | Freq: Once | INTRAVENOUS | Status: AC | PRN
  Administered 2012-05-05: 500 [IU] via INTRAVENOUS
  Filled 2012-05-05: qty 5

## 2012-05-05 MED ORDER — ALTEPLASE 2 MG IJ SOLR
2.0000 mg | Freq: Once | INTRAMUSCULAR | Status: DC | PRN
  Filled 2012-05-05: qty 2

## 2012-05-05 MED ORDER — SODIUM CHLORIDE 0.9 % IJ SOLN
10.0000 mL | INTRAMUSCULAR | Status: DC | PRN
  Administered 2012-05-05: 10 mL via INTRAVENOUS
  Filled 2012-05-05: qty 10

## 2012-05-05 NOTE — Patient Instructions (Signed)

## 2012-05-26 ENCOUNTER — Ambulatory Visit (HOSPITAL_BASED_OUTPATIENT_CLINIC_OR_DEPARTMENT_OTHER): Payer: Medicare Other

## 2012-05-26 VITALS — BP 110/63 | HR 71 | Temp 97.0°F | Resp 18

## 2012-05-26 DIAGNOSIS — Z452 Encounter for adjustment and management of vascular access device: Secondary | ICD-10-CM

## 2012-05-26 DIAGNOSIS — C3490 Malignant neoplasm of unspecified part of unspecified bronchus or lung: Secondary | ICD-10-CM

## 2012-05-26 DIAGNOSIS — C3410 Malignant neoplasm of upper lobe, unspecified bronchus or lung: Secondary | ICD-10-CM

## 2012-05-26 MED ORDER — HEPARIN SOD (PORK) LOCK FLUSH 100 UNIT/ML IV SOLN
500.0000 [IU] | Freq: Once | INTRAVENOUS | Status: AC | PRN
  Administered 2012-05-26: 500 [IU] via INTRAVENOUS
  Filled 2012-05-26: qty 5

## 2012-05-26 MED ORDER — SODIUM CHLORIDE 0.9 % IJ SOLN
10.0000 mL | INTRAMUSCULAR | Status: DC | PRN
  Administered 2012-05-26: 10 mL via INTRAVENOUS
  Filled 2012-05-26: qty 10

## 2012-05-26 MED ORDER — ALTEPLASE 2 MG IJ SOLR
2.0000 mg | Freq: Once | INTRAMUSCULAR | Status: DC | PRN
  Filled 2012-05-26: qty 2

## 2012-05-26 NOTE — Patient Instructions (Signed)

## 2012-05-26 NOTE — Progress Notes (Signed)
[name] presented for Portacath access and flush. Proper placement of portacath confirmed by CXR. Portacath located in the left chest wall accessed with  H 20 needle. Clean, Dry and Intact Good blood return present. Portacath flushed with 20ml NS and 500U/5ml Heparin per protocol and needle removed intact. Procedure without incident. Patient tolerated procedure well.   

## 2012-06-16 ENCOUNTER — Ambulatory Visit (HOSPITAL_BASED_OUTPATIENT_CLINIC_OR_DEPARTMENT_OTHER): Payer: Medicare Other

## 2012-06-16 VITALS — BP 140/71 | HR 64 | Temp 97.1°F | Resp 18

## 2012-06-16 DIAGNOSIS — C3410 Malignant neoplasm of upper lobe, unspecified bronchus or lung: Secondary | ICD-10-CM

## 2012-06-16 DIAGNOSIS — Z452 Encounter for adjustment and management of vascular access device: Secondary | ICD-10-CM

## 2012-06-16 DIAGNOSIS — C3490 Malignant neoplasm of unspecified part of unspecified bronchus or lung: Secondary | ICD-10-CM

## 2012-06-16 MED ORDER — ALTEPLASE 2 MG IJ SOLR
2.0000 mg | Freq: Once | INTRAMUSCULAR | Status: DC | PRN
  Filled 2012-06-16: qty 2

## 2012-06-16 MED ORDER — HEPARIN SOD (PORK) LOCK FLUSH 100 UNIT/ML IV SOLN
500.0000 [IU] | Freq: Once | INTRAVENOUS | Status: DC | PRN
  Filled 2012-06-16: qty 5

## 2012-06-16 MED ORDER — SODIUM CHLORIDE 0.9 % IJ SOLN
10.0000 mL | INTRAMUSCULAR | Status: DC | PRN
  Filled 2012-06-16: qty 10

## 2012-06-16 NOTE — Progress Notes (Signed)
Kahli F Jablonsky presented for Portacath access and flush. Proper placement of portacath confirmed by CXR. Portacath located in the left chest wall accessed with  H 20 needle. Clean, Dry and Intact Good blood return present. Portacath flushed with 20ml NS and 500U/5ml Heparin per protocol and needle removed intact. Procedure without incident. Patient tolerated procedure well.   

## 2012-06-16 NOTE — Patient Instructions (Signed)

## 2012-07-24 ENCOUNTER — Ambulatory Visit (HOSPITAL_BASED_OUTPATIENT_CLINIC_OR_DEPARTMENT_OTHER): Payer: Medicare Other

## 2012-07-24 ENCOUNTER — Ambulatory Visit (HOSPITAL_BASED_OUTPATIENT_CLINIC_OR_DEPARTMENT_OTHER): Admission: RE | Admit: 2012-07-24 | Payer: Medicare Other | Source: Ambulatory Visit

## 2012-07-28 ENCOUNTER — Encounter (HOSPITAL_BASED_OUTPATIENT_CLINIC_OR_DEPARTMENT_OTHER): Payer: Self-pay

## 2012-07-28 ENCOUNTER — Ambulatory Visit (HOSPITAL_BASED_OUTPATIENT_CLINIC_OR_DEPARTMENT_OTHER)
Admission: RE | Admit: 2012-07-28 | Discharge: 2012-07-28 | Disposition: A | Discharge status: Home / Self Care | Payer: Medicare Other | Source: Ambulatory Visit | Attending: Hematology & Oncology | Admitting: Hematology & Oncology

## 2012-07-28 ENCOUNTER — Other Ambulatory Visit: Payer: Self-pay | Admitting: Hematology & Oncology

## 2012-07-28 ENCOUNTER — Ambulatory Visit (HOSPITAL_BASED_OUTPATIENT_CLINIC_OR_DEPARTMENT_OTHER): Payer: Medicare Other

## 2012-07-28 ENCOUNTER — Other Ambulatory Visit: Payer: Medicare Other | Admitting: Lab

## 2012-07-28 VITALS — BP 128/80 | HR 61 | Temp 96.7°F | Resp 18

## 2012-07-28 DIAGNOSIS — I82409 Acute embolism and thrombosis of unspecified deep veins of unspecified lower extremity: Secondary | ICD-10-CM

## 2012-07-28 DIAGNOSIS — C3490 Malignant neoplasm of unspecified part of unspecified bronchus or lung: Secondary | ICD-10-CM

## 2012-07-28 DIAGNOSIS — J90 Pleural effusion, not elsewhere classified: Secondary | ICD-10-CM | POA: Insufficient documentation

## 2012-07-28 DIAGNOSIS — C3410 Malignant neoplasm of upper lobe, unspecified bronchus or lung: Secondary | ICD-10-CM

## 2012-07-28 DIAGNOSIS — Z452 Encounter for adjustment and management of vascular access device: Secondary | ICD-10-CM

## 2012-07-28 LAB — CBC WITH DIFFERENTIAL (CANCER CENTER ONLY)
BASO%: 0.4 % (ref 0.0–2.0)
Eosinophils Absolute: 0.1 10*3/uL (ref 0.0–0.5)
HCT: 44 % (ref 38.7–49.9)
LYMPH%: 22.9 % (ref 14.0–48.0)
MCH: 30.6 pg (ref 28.0–33.4)
MCV: 90 fL (ref 82–98)
MONO#: 0.6 10*3/uL (ref 0.1–0.9)
MONO%: 10.7 % (ref 0.0–13.0)
NEUT%: 64.4 % (ref 40.0–80.0)
RDW: 13.1 % (ref 11.1–15.7)
WBC: 5.1 10*3/uL (ref 4.0–10.0)

## 2012-07-28 LAB — CMP (CANCER CENTER ONLY)
BUN, Bld: 13 mg/dL (ref 7–22)
CO2: 31 mEq/L (ref 18–33)
Calcium: 9.1 mg/dL (ref 8.0–10.3)
Chloride: 99 mEq/L (ref 98–108)
Creat: 0.9 mg/dl (ref 0.6–1.2)
Total Bilirubin: 0.8 mg/dl (ref 0.20–1.60)

## 2012-07-28 MED ORDER — HEPARIN SOD (PORK) LOCK FLUSH 100 UNIT/ML IV SOLN
500.0000 [IU] | Freq: Once | INTRAVENOUS | Status: AC | PRN
  Administered 2012-07-28: 500 [IU] via INTRAVENOUS
  Filled 2012-07-28: qty 5

## 2012-07-28 MED ORDER — ALTEPLASE 2 MG IJ SOLR
2.0000 mg | Freq: Once | INTRAMUSCULAR | Status: DC | PRN
  Filled 2012-07-28: qty 2

## 2012-07-28 MED ORDER — IOHEXOL 300 MG/ML  SOLN
100.0000 mL | Freq: Once | INTRAMUSCULAR | Status: AC | PRN
  Administered 2012-07-28: 100 mL via INTRAVENOUS

## 2012-07-28 MED ORDER — SODIUM CHLORIDE 0.9 % IJ SOLN
10.0000 mL | INTRAMUSCULAR | Status: DC | PRN
  Administered 2012-07-28: 10 mL via INTRAVENOUS
  Filled 2012-07-28: qty 10

## 2012-07-28 NOTE — Patient Instructions (Signed)

## 2012-07-28 NOTE — Progress Notes (Signed)
[name] presented for Portacath access and flush. Proper placement of portacath confirmed by CXR. Portacath located in the left chest wall accessed with  H 20 needle. Clean, Dry and Intact Good blood return present. Portacath flushed with 20ml NS and 500U/5ml Heparin per protocol and needle removed intact. Procedure without incident. Patient tolerated procedure well.   

## 2012-07-31 ENCOUNTER — Ambulatory Visit: Payer: Medicare Other | Admitting: Hematology & Oncology

## 2012-07-31 ENCOUNTER — Other Ambulatory Visit: Payer: Medicare Other | Admitting: Lab

## 2012-08-11 ENCOUNTER — Telehealth: Payer: Self-pay | Admitting: Hematology & Oncology

## 2012-08-11 NOTE — Telephone Encounter (Signed)
[name] called and cx 09/23/12 apt and resch for 09/11/12

## 2012-08-25 ENCOUNTER — Ambulatory Visit (INDEPENDENT_AMBULATORY_CARE_PROVIDER_SITE_OTHER): Payer: Medicare Other

## 2012-08-25 VITALS — BP 127/77 | Ht 70.0 in | Wt 235.0 lb

## 2012-08-25 DIAGNOSIS — G4733 Obstructive sleep apnea (adult) (pediatric): Secondary | ICD-10-CM

## 2012-08-25 DIAGNOSIS — G4736 Sleep related hypoventilation in conditions classified elsewhere: Secondary | ICD-10-CM

## 2012-08-25 DIAGNOSIS — G4700 Insomnia, unspecified: Secondary | ICD-10-CM

## 2012-09-11 ENCOUNTER — Encounter: Payer: Self-pay | Admitting: Neurology

## 2012-09-11 ENCOUNTER — Other Ambulatory Visit: Payer: Medicare Other | Admitting: Lab

## 2012-09-11 ENCOUNTER — Ambulatory Visit: Payer: Medicare Other

## 2012-09-11 ENCOUNTER — Ambulatory Visit (HOSPITAL_BASED_OUTPATIENT_CLINIC_OR_DEPARTMENT_OTHER): Payer: Medicare Other | Admitting: Hematology & Oncology

## 2012-09-11 DIAGNOSIS — C3490 Malignant neoplasm of unspecified part of unspecified bronchus or lung: Secondary | ICD-10-CM

## 2012-09-11 DIAGNOSIS — I82409 Acute embolism and thrombosis of unspecified deep veins of unspecified lower extremity: Secondary | ICD-10-CM

## 2012-09-11 DIAGNOSIS — I82401 Acute embolism and thrombosis of unspecified deep veins of right lower extremity: Secondary | ICD-10-CM

## 2012-09-11 DIAGNOSIS — G4733 Obstructive sleep apnea (adult) (pediatric): Secondary | ICD-10-CM

## 2012-09-11 DIAGNOSIS — Z85118 Personal history of other malignant neoplasm of bronchus and lung: Secondary | ICD-10-CM

## 2012-09-11 MED ORDER — ALTEPLASE 2 MG IJ SOLR
2.0000 mg | Freq: Once | INTRAMUSCULAR | Status: AC | PRN
  Filled 2012-09-11: qty 2

## 2012-09-11 MED ORDER — SODIUM CHLORIDE 0.9 % IJ SOLN
10.0000 mL | INTRAMUSCULAR | Status: DC | PRN
  Administered 2012-09-11: 10 mL via INTRAVENOUS
  Filled 2012-09-11: qty 10

## 2012-09-11 MED ORDER — HEPARIN SOD (PORK) LOCK FLUSH 100 UNIT/ML IV SOLN
500.0000 [IU] | Freq: Once | INTRAVENOUS | Status: AC | PRN
  Administered 2012-09-11: 500 [IU] via INTRAVENOUS
  Filled 2012-09-11: qty 5

## 2012-09-11 NOTE — Progress Notes (Signed)
CC:   [name], M.D.  DIAGNOSES: 1. 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 2. Deep vein thrombosis of the right subclavian vein.  CURRENT THERAPY:  Aspirin 162 mg p.o. daily.  INTERIM HISTORY:  [name] comes in for his followup.  He last had scans done back in February.  The scans did not show any evidence of recurrent lung cancer, which to me is quite surprising.  His big problem now is his poor wife, who I also take care of, is in a nursing home.  She fell a week or so ago.  She broke her hip.  She had surgery for this.  I am just shocked that she made it through surgery, given all of her health issues.  [name] otherwise has been doing okay.  He is eating okay.  There has been no nausea or vomiting.  He is chronically short of breath.  He has had no change in bowel or bladder habits.  There has been no headache.  PHYSICAL EXAMINATION:  General:  This is a well-developed, well- nourished white gentleman in no obvious distress.  Vital signs: Temperature of 97.4, pulse 64, respiratory rate 18, blood pressure 120/72.  Weight is 235.  Head/neck:  Normocephalic, atraumatic skull. There are no ocular or oral lesions.  There are no palpable cervical or supraclavicular lymph nodes.  Lungs:  Clear bilaterally.  Cardiac: Regular rate and rhythm with a normal S1 and S2.  There are no murmurs, rubs, or bruits.  Abdomen:  Soft with good bowel sounds.  There is no palpable abdominal mass.  There is no fluid wave.  No palpable hepatosplenomegaly is noted.  Back:  No tenderness of the spine, ribs, or hips.  Neurological:  No focal neurological deficits.  LABORATORY DATA:  Not taken this visit.  IMPRESSION:  [name] is an 79 year old gentleman who certainly does not look his age.  He is incredibly stout.  He completed his chemo and radiation therapy back in, I think, January 2013.  We will follow him with scans.  We will set up his next set of scans in June of  2014.  I think once we get him through this year, then we can probably go to the chest x-rays.  We will continue to pray hard for him.  His wife has gone through a whole lot.  Of note, he did have a subclavian DVT which was likely related to his malignancy.  He is on Arixtra for 1 year.  We now have him on aspirin.  We will go ahead and flush his Port-A-Cath.    ______________________________ [name], M.D. PRE/MEDQ  D:  09/11/2012  T:  09/11/2012  Job:  [id number]

## 2012-09-11 NOTE — Patient Instructions (Signed)

## 2012-09-11 NOTE — Progress Notes (Signed)
This office note has been dictated.

## 2012-09-11 NOTE — Progress Notes (Signed)
Quick Note:  This patient of Dr. Daleen Bo Avva's underwent a sleep study on 08/25/2012. The study was a split night polysomnography:  the patient reached an apnea hypopnea index of 10.6 and a very high respiratory disturbance index of 69.1 he was titrated to CPAP and reached 8 cm water, but he had trouble staying asleep.  CPAP caused the patient to have desaturations in oxygen levels that were not present in the diagnostic part of this study. I have ordered an old good hydration for a duration of 2-3 weeks to identify an optimal treatment pressure for this patient who has been using CPAP at home for many years.  Troyce Gieske, MD  ______

## 2012-09-14 ENCOUNTER — Other Ambulatory Visit: Payer: Self-pay | Admitting: Neurology

## 2012-09-14 DIAGNOSIS — G4733 Obstructive sleep apnea (adult) (pediatric): Secondary | ICD-10-CM

## 2012-09-14 NOTE — Progress Notes (Signed)
The patient's new CPAP settings.

## 2012-09-23 ENCOUNTER — Other Ambulatory Visit: Payer: Medicare Other | Admitting: Lab

## 2012-09-23 ENCOUNTER — Ambulatory Visit: Payer: Medicare Other | Admitting: Hematology & Oncology

## 2012-10-29 ENCOUNTER — Encounter: Payer: Self-pay | Admitting: Neurology

## 2012-10-29 ENCOUNTER — Ambulatory Visit (INDEPENDENT_AMBULATORY_CARE_PROVIDER_SITE_OTHER): Payer: Medicare Other | Admitting: Neurology

## 2012-10-29 VITALS — BP 125/81 | HR 87 | Temp 97.8°F | Ht 70.0 in | Wt 233.0 lb

## 2012-10-29 DIAGNOSIS — G4733 Obstructive sleep apnea (adult) (pediatric): Secondary | ICD-10-CM | POA: Insufficient documentation

## 2012-10-29 DIAGNOSIS — J449 Chronic obstructive pulmonary disease, unspecified: Secondary | ICD-10-CM | POA: Insufficient documentation

## 2012-10-29 DIAGNOSIS — J4489 Other specified chronic obstructive pulmonary disease: Secondary | ICD-10-CM

## 2012-10-29 HISTORY — DX: Obstructive sleep apnea (adult) (pediatric): G47.33

## 2012-10-29 NOTE — Patient Instructions (Addendum)
CPAP and BIPAP CPAP and BIPAP are methods of helping you breathe. CPAP stands for "continuous positive airway pressure." BIPAP stands for "bi-level positive airway pressure." Both CPAP and BIPAP are provided by a small machine with a flexible plastic tube that attaches to a plastic mask that goes over your nose or mouth. Air is blown into your air passages through your nose or mouth. This helps to keep your airways open and helps to keep you breathing well. The amount of pressure that is used to blow the air into your air passages can be set on the machine. The pressure setting is based on your needs. With CPAP, the amount of pressure stays the same while you breathe in and out. With BIPAP, the amount of pressure changes when you inhale and exhale. Your caregiver will recommend whether CPAP or BIPAP would be more helpful for you.  CPAP and BIPAP can be helpful for both adults and children with:  Sleep apnea.  Chronic Obstructive Pulmonary Disease (COPD), a condition like emphysema.  Diseases which weaken the muscles of the chest such as muscular dystrophy or neurological diseases.  Other problems that cause breathing to be weak or difficult. USE OF CPAP OR BIPAP The respiratory therapist or technician will help you get used to wearing the mask. Some people feel claustrophobic (a trapped or closed in feeling) at first, because the mask needs to be fairly snug on your face.   It may help you to get used to the mask gradually, by first holding the mask loosely over your nose or mouth using a low pressure setting on the machine. Gradually the mask can be applied more snugly with increased pressure. You can also gradually increase the amount of time the mask is used.  People with sleep apnea will use the mask and machine at night when they are sleeping. Others, like those with ALS or other breathing difficulties, may need the CPAP or BIPAP all the time.  If the first mask you try does not fit well, or  is uncomfortable, there are other types and sizes that can be tried.  If you tend to breathe through your mouth, a chin strap may be applied to help keep your mouth closed (if you are using a nasal mask).  The CPAP and BIPAP machines have alarms that may sound if the mask comes off or develops a leak.  You should not eat or drink while the CPAP or BIPAP is on. Food or fluids could get pushed into your lungs by the pressure of the CPAP or BIPAP. Sometimes CPAP or BIPAP machines are ordered for home use. If you are going to use the CPAP or BIPAP machine at home, follow these instructions  CPAP or BIPAP machines can be rented or purchased through home health care companies. There are many different brands of machines available. If you rent a machine before purchasing you may find which particular machine works well for you.  Ask questions if there is something you do not understand when picking out your machine.  Place your CPAP or BIPAP machine on a secure table or stand near an electrical outlet.  Know where the On/Off switch is.  Follow your doctor's instructions for how to set the pressure on your machine and when you should use it.  Do not smoke! Tobacco smoke residue can damage the machine. SEEK IMMEDIATE MEDICAL CARE IF:   You have redness or open areas around your nose or mouth.  You have trouble operating  the CPAP or BIPAP machine.  You cannot tolerate wearing the CPAP or BIPAP mask.  You have any questions or concerns. Document Released: 02/23/2004 Document Revised: 08/19/2011 Document Reviewed: 05/24/2008 [facility] Patient Information 2014 [place]. Sleep Apnea  Sleep apnea is a sleep disorder characterized by abnormal pauses in breathing while you sleep. When your breathing pauses, the level of oxygen in your blood decreases. This causes you to move out of deep sleep and into light sleep. As a result, your quality of sleep is poor, and the system that carries your blood  throughout your body (cardiovascular system) experiences stress. If sleep apnea remains untreated, the following conditions can develop:  High blood pressure (hypertension).  Coronary artery disease.  Inability to achieve or maintain an erection (impotence).  Impairment of your thought process (cognitive dysfunction). There are three types of sleep apnea: 1. Obstructive sleep apnea Pauses in breathing during sleep because of a blocked airway. 2. Central sleep apnea Pauses in breathing during sleep because the area of the brain that controls your breathing does not send the correct signals to the muscles that control breathing. 3. Mixed sleep apnea A combination of both obstructive and central sleep apnea. RISK FACTORS The following risk factors can increase your risk of developing sleep apnea:  Being overweight.  Smoking.  Having narrow passages in your nose and throat.  Being of older age.  Being male.  Alcohol use.  Sedative and tranquilizer use.  Ethnicity. Among individuals younger than 35 years, African Americans are at increased risk of sleep apnea. SYMPTOMS   Difficulty staying asleep.  Daytime sleepiness and fatigue.  Loss of energy.  Irritability.  Loud, heavy snoring.  Morning headaches.  Trouble concentrating.  Forgetfulness.  Decreased interest in sex. DIAGNOSIS  In order to diagnose sleep apnea, your caregiver will perform a physical examination. Your caregiver may suggest that you take a home sleep test. Your caregiver may also recommend that you spend the night in a sleep lab. In the sleep lab, several monitors record information about your heart, lungs, and brain while you sleep. Your leg and arm movements and blood oxygen level are also recorded. TREATMENT The following actions may help to resolve mild sleep apnea:  Sleeping on your side.   Using a decongestant if you have nasal congestion.   Avoiding the use of depressants, including  alcohol, sedatives, and narcotics.   Losing weight and modifying your diet if you are overweight. There also are devices and treatments to help open your airway:  Oral appliances. These are custom-made mouthpieces that shift your lower jaw forward and slightly open your bite. This opens your airway.  Devices that create positive airway pressure. This positive pressure "splints" your airway open to help you breathe better during sleep. The following devices create positive airway pressure:  Continuous positive airway pressure (CPAP) device. The CPAP device creates a continuous level of air pressure with an air pump. The air is delivered to your airway through a mask while you sleep. This continuous pressure keeps your airway open.  Nasal expiratory positive airway pressure (EPAP) device. The EPAP device creates positive air pressure as you exhale. The device consists of single-use valves, which are inserted into each nostril and held in place by adhesive. The valves create very little resistance when you inhale but create much more resistance when you exhale. That increased resistance creates the positive airway pressure. This positive pressure while you exhale keeps your airway open, making it easier to breath when you  inhale again.  Bilevel positive airway pressure (BPAP) device. The BPAP device is used mainly in patients with central sleep apnea. This device is similar to the CPAP device because it also uses an air pump to deliver continuous air pressure through a mask. However, with the BPAP machine, the pressure is set at two different levels. The pressure when you exhale is lower than the pressure when you inhale.  Surgery. Typically, surgery is only done if you cannot comply with less invasive treatments or if the less invasive treatments do not improve your condition. Surgery involves removing excess tissue in your airway to create a wider passage way. Document Released: 05/17/2002 Document  Revised: 11/26/2011 Document Reviewed: 10/03/2011 [facility] Patient Information 2014 [place], [place].

## 2012-10-29 NOTE — Progress Notes (Signed)
[facility]  Provider:  Dr Vickey Huger Referring Provider: Hoyle Sauer, MD Primary Care Physician:  Hoyle Sauer, MD  Chief Complaint  Patient presents with  . Follow-up    cpap f/u,rm 10    HPI:  [name] is a 79 y.o. male here as a referral from Dr. Felipa Eth for follow up on sleep apnea. He Mr. Howland presented first on March hands 2014 in a sleep consultation. By the time he had already been on CPAP. The patient reported that he on the machine for about 14 years but it had never been interrogated or reflux. He complained to his primary care physician about excessive sleepiness and suspected that the CPAP had no longer the desired effect. His machine is not download of all but it was originally set at 10 cm water pressure and he has used a nasal mask comfort gel ever since he was initially started on therapy. He gets regular replacements through [facility].  The patient reported his sleep habits as as follows He goes to bed at 10 PM,  he normally requires 5 mg of Ambien to sleep,  he uses his CPAP every night sleeps through until about 3 AM and then is to go to the bathroom.  After he went to the bathroom he can sleep again for about another 2 hours but it takes them off 40 minutes or so to fall asleep again.  He has reported that in the morning he feels no longer as restored. His medical history includes a history of small cell lung cancer and treatments for this during which he lost 65 pounds and became increasingly more fatigued. He is a Visual merchandiser but retired, he also has been a smoker until 2012. Has been diagnosed with COPD.   A shunt underwent a split night polysomnography on 3-18 2014 but an optimal pressure was not found. The patient had great trouble to fall asleep the RDI was 69.1 in the study his AHI 10.6 his arousal index was 83.5. An auto titration followed from 10-08-12 through today, revealing the 95% .  When I initially saw the patient in March  2014 he endorses sleepiness score at 10 pints and the cath as as at 54 points.today On 10/29/2012 he reports that with the use of the CPAP he has about 9 he endorsed about 9 points on his sleepiness score. The patient does not take regular daytime naps, but recalls it was out of CPAP he would. He acknowledges a gradual improvement in his sleepiness, and feels that the new machine is working well for him. He would like to change from the current  Nasal CPAPmask to a model with a nasal pillow application. He is very motivated.      In the office download shows that the patient uses his machine nightly for 8 hours and 16 the machine as reviewed over the last 21 days, each of those days the patient has used the machine for over 4 hours and 30 minutes.  His residual apnea index is 1.6 his 95% st percentile pressure is 8.9 cm, so I feel that he could such as machine at 9 cm of water which seems to be optimal for him.  The patient reports that the night of the in laboratory sleep study ,  he was unable to initiate sleep and did not feel rested in the morning.  The patient has used CPAP before his re- titration at 11 cm water pressure.   Review of Systems:  Out of a complete 14 system review, the patient complains of only the following symptoms, and all other reviewed systems are negative. still insomnia -  8 hors of sleep .  History   Social History  . Marital Status: Married    Spouse Name: N/A    Number of Children: 2  . Years of Education: N/A   Occupational History  . farmer    Social History Main Topics  . Smoking status: Former Smoker -- 1.00 packs/day for 50 years    Types: Cigarettes    Quit date: 06/10/1992  . Smokeless tobacco: Former Neurosurgeon    Quit date: 08/01/2010  . Alcohol Use: No  . Drug Use: No  . Sexually Active: Not on file   Other Topics Concern  . Not on file   Social History Narrative   PAP was  manometer tested. Mask refitted.     This patient with  14 years  of CPAP experience,  and feeling less of a benefit. He is more tired and fatigued, his machine was manometer checked at 10.5 cm water pressure.  It's working at the level it was set 14 years ago.  He lost 65 pounds, had cancer treatment for lung cancer. 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  He needs a SPLIT night to requalify for CPAP, and I would like to take his comorbidities into account.     He will need a SPLIT at AHI 10 if Medicare allows, 4% scoring. Use  nasal pillow P10, and he likes to sleep supine.      Family History  Problem Relation Age of Onset  . Coronary artery disease    . Cancer    . Multiple sclerosis    . Diabetes type II    . Anesthesia problems Neg Hx   . Hypotension Neg Hx   . Malignant hyperthermia Neg Hx   . Pseudochol deficiency Neg Hx     Past Medical History  Diagnosis Date  . Diabetes mellitus   . Lung mass   . DVT (deep venous thrombosis)     IN RIGHT ARM 11/2010   . BPH (benign prostatic hyperplasia)   . COPD (chronic obstructive pulmonary disease)        . Hyperlipidemia   . Allergic rhinitis   . Allergic conjunctivitis   . DJD (degenerative joint disease)     Left Knee  . Sleep apnea     CPAP at night  --- 8-10 YR AGO....,OSA-diagnosed 2000  . Shortness of breath   . Pneumonia     history  . Diverticulosis     hx  . Cellulitis     hx  . Hearing loss   . Lung cancer      history of stage IIIA non-small cell lung cancer who is currently being treated with both  Radiation and Chemotherapy  . Cataract   . Conjunctivitis   . Diabetic peripheral neuropathy   . Hypertension   . Hypertrophic prostatitis     with obstruction    Past Surgical History  Procedure Laterality Date  . Insertion of a left subclavian port-a-cath.  12/17/10    [place]  . Portacath placement  04/23/2011    Procedure: INSERTION PORT-A-CATH;  Surgeon: Norton Blizzard, MD;  Location: [facility] OR;  Service: Thoracic;;  Revision of  Porta-Cath  . Fiberoptic  bronchoscopy  12/11/2010  . Video bronchoscopy  09/27/2011    Procedure: VIDEO BRONCHOSCOPY;  Surgeon: Ines Bloomer, MD;  Location: [facility] OR;  Service: Thoracic;  Laterality: N/A;  . Deviated septum repair    . Cardiac catheterization  2006  . Portacath placement      Current Outpatient Prescriptions  Medication Sig Dispense Refill  . aspirin 81 MG tablet Take 81 mg by mouth 2 (two) times daily.      . cetirizine (ZYRTEC) 10 MG tablet Take 10 mg by mouth every morning.       Marland Kitchen glimepiride (AMARYL) 2 MG tablet Take 2 mg by mouth daily as needed. Take when blood sugar is > 150      . glucosamine-chondroitin 500-400 MG tablet Take 2 tablets by mouth every morning.       . insulin lispro (HUMALOG) 100 UNIT/ML injection Inject into the skin 3 (three) times daily as needed. Use as directed.  BS 150-200  Take 2units BS 201-250  4 units. BS 251 - 300 6units. BS  301-350 8 units.      . metFORMIN (GLUCOPHAGE) 500 MG tablet Take 500 mg by mouth 2 (two) times daily.      . Psyllium (METAMUCIL PO) Take 2 capsules by mouth daily as needed. For constipation      . simvastatin (ZOCOR) 40 MG tablet Take 40 mg by mouth at bedtime.       Marland Kitchen zolpidem (AMBIEN) 10 MG tablet Take 1/2 tab nightly as needed  30 tablet  2   No current facility-administered medications for this visit.   Facility-Administered Medications Ordered in Other Visits  Medication Dose Route Frequency Provider Last Rate Last Dose  . sodium chloride 0.9 % injection 10 mL  10 mL Intravenous PRN Josph Macho, MD   10 mL at 09/11/12 0906    Allergies as of 10/29/2012  . (No Known Allergies)    Vitals: BP 125/81  Pulse 87  Temp(Src) 97.8 F (36.6 C) (Oral)  Ht 5\' 10"  (1.778 m)  Wt 233 lb (105.688 kg)  BMI 33.43 kg/m2 Last Weight:  Wt Readings from Last 1 Encounters:  10/29/12 233 lb (105.688 kg)   Last Height:   Ht Readings from Last 1 Encounters:  10/29/12 5\' 10"  (1.778 m)   Vision Screening: vitals  Physical  exam:  General: The patient is awake, alert and appears not in acute distress. The patient is well groomed. Head: Normocephalic, atraumatic. Neck is supple. Mallampati 2-3 , elongated uvula.  neck circumference:18.5 inches . Nor retrognathia noted Cardiovascular:  Regular rate and rhythm, without  murmurs or carotid bruit, and without distended neck veins. Respiratory: Lungs are clear to auscultation. Skin:  Without evidence of rash, Asian has bilateral mild ankle edema. Patient also has a facial rash and states that the nasal CPAP mask does irritate his skin and he would like to change to a different model Trunk: BMI is elevated and patient  has normal posture. No falls reported in 18 month.  Dizziness only when standing up fast, no vertigo.   Neurologic exam : The patient is awake and alert, oriented to place and time.  Memory subjective described as intact. There is a normal attention span & concentration ability. Speech is fluent without  dysarthria, dysphonia . Mood and affect are appropriate.  Cranial nerves: Pupils are equal and briskly reactive to light.. Extraocular movements  in vertical and horizontal planes intact and without nystagmus. Visual fields by finger perimetry are intact. Hearing to finger rub decreased, air conduction reduced on the right bone conduction appears similar and strength left and right year.  Facial sensation intact to fine touch. Facial motor strength is symmetric and tongue and uvula move midline.  Motor exam:  Normal tone and normal muscle bulk and symmetric normal strength in all extremities.  Sensory:  Fine touch, pinprick and vibration were tested in all extremities.   Coordination: Rapid alternating movements in the fingers/hands is tested and normal. Finger-to-nose maneuver tested and normal without evidence of ataxia, dysmetria or tremor.  Gait and station: Patient walks without assistive device . Strength within normal limits. Stance is stable and  normal.  Deep tendon reflexes: in the  upper and lower extremities are symmetric and intact.   Assessment:  After physical and neurologic examination, review of laboratory studies, imaging, neurophysiology testing and pre-existing records, assessment will be reviewed on the problem list. Mild apnea with an overall AHI of just over 10, but associated this frequent and severe desaturations. This hypoventilation component is lately related to COPD. The patient also has lung cancer.  Plan:  Treatment plan and additional workup will be reviewed under Problem List. Educational material about apnea and positive airway pressure therapy was provided for the patient and his daughter. Total visit duration 25 minutes.

## 2012-11-07 ENCOUNTER — Encounter (HOSPITAL_COMMUNITY): Payer: Self-pay | Admitting: Emergency Medicine

## 2012-11-07 ENCOUNTER — Emergency Department (HOSPITAL_COMMUNITY): Payer: Medicare Other

## 2012-11-07 ENCOUNTER — Inpatient Hospital Stay (HOSPITAL_COMMUNITY)
Admission: EM | Admit: 2012-11-07 | Discharge: 2012-11-12 | DRG: 308 | Disposition: A | Discharge status: Home / Self Care | Payer: Medicare Other | Attending: Cardiology | Admitting: Cardiology

## 2012-11-07 DIAGNOSIS — Z87891 Personal history of nicotine dependence: Secondary | ICD-10-CM

## 2012-11-07 DIAGNOSIS — Z6832 Body mass index (BMI) 32.0-32.9, adult: Secondary | ICD-10-CM

## 2012-11-07 DIAGNOSIS — N400 Enlarged prostate without lower urinary tract symptoms: Secondary | ICD-10-CM | POA: Diagnosis present

## 2012-11-07 DIAGNOSIS — Z794 Long term (current) use of insulin: Secondary | ICD-10-CM

## 2012-11-07 DIAGNOSIS — J449 Chronic obstructive pulmonary disease, unspecified: Secondary | ICD-10-CM | POA: Diagnosis present

## 2012-11-07 DIAGNOSIS — I5023 Acute on chronic systolic (congestive) heart failure: Secondary | ICD-10-CM

## 2012-11-07 DIAGNOSIS — E669 Obesity, unspecified: Secondary | ICD-10-CM | POA: Diagnosis present

## 2012-11-07 DIAGNOSIS — R0902 Hypoxemia: Secondary | ICD-10-CM | POA: Diagnosis not present

## 2012-11-07 DIAGNOSIS — Z86718 Personal history of other venous thrombosis and embolism: Secondary | ICD-10-CM

## 2012-11-07 DIAGNOSIS — Z85118 Personal history of other malignant neoplasm of bronchus and lung: Secondary | ICD-10-CM

## 2012-11-07 DIAGNOSIS — I10 Essential (primary) hypertension: Secondary | ICD-10-CM | POA: Diagnosis present

## 2012-11-07 DIAGNOSIS — E785 Hyperlipidemia, unspecified: Secondary | ICD-10-CM | POA: Diagnosis present

## 2012-11-07 DIAGNOSIS — E1142 Type 2 diabetes mellitus with diabetic polyneuropathy: Secondary | ICD-10-CM | POA: Diagnosis present

## 2012-11-07 DIAGNOSIS — I4891 Unspecified atrial fibrillation: Principal | ICD-10-CM | POA: Diagnosis present

## 2012-11-07 DIAGNOSIS — E1149 Type 2 diabetes mellitus with other diabetic neurological complication: Secondary | ICD-10-CM | POA: Diagnosis present

## 2012-11-07 DIAGNOSIS — I509 Heart failure, unspecified: Secondary | ICD-10-CM | POA: Diagnosis present

## 2012-11-07 DIAGNOSIS — I5031 Acute diastolic (congestive) heart failure: Secondary | ICD-10-CM | POA: Diagnosis present

## 2012-11-07 DIAGNOSIS — Z923 Personal history of irradiation: Secondary | ICD-10-CM

## 2012-11-07 DIAGNOSIS — G4733 Obstructive sleep apnea (adult) (pediatric): Secondary | ICD-10-CM | POA: Diagnosis present

## 2012-11-07 DIAGNOSIS — Z7901 Long term (current) use of anticoagulants: Secondary | ICD-10-CM

## 2012-11-07 DIAGNOSIS — Z79899 Other long term (current) drug therapy: Secondary | ICD-10-CM

## 2012-11-07 DIAGNOSIS — I480 Paroxysmal atrial fibrillation: Secondary | ICD-10-CM | POA: Diagnosis present

## 2012-11-07 DIAGNOSIS — J4489 Other specified chronic obstructive pulmonary disease: Secondary | ICD-10-CM | POA: Diagnosis present

## 2012-11-07 DIAGNOSIS — Z9221 Personal history of antineoplastic chemotherapy: Secondary | ICD-10-CM

## 2012-11-07 DIAGNOSIS — H9190 Unspecified hearing loss, unspecified ear: Secondary | ICD-10-CM | POA: Diagnosis present

## 2012-11-07 DIAGNOSIS — E1342 Other specified diabetes mellitus with diabetic polyneuropathy: Secondary | ICD-10-CM | POA: Diagnosis present

## 2012-11-07 HISTORY — DX: Obstructive sleep apnea (adult) (pediatric): G47.33

## 2012-11-07 HISTORY — DX: Other specified diabetes mellitus with diabetic polyneuropathy: E13.42

## 2012-11-07 HISTORY — DX: Acute diastolic (congestive) heart failure: I50.31

## 2012-11-07 LAB — COMPREHENSIVE METABOLIC PANEL
AST: 15 U/L (ref 0–37)
Albumin: 3 g/dL — ABNORMAL LOW (ref 3.5–5.2)
Alkaline Phosphatase: 93 U/L (ref 39–117)
BUN: 14 mg/dL (ref 6–23)
BUN: 15 mg/dL (ref 6–23)
CO2: 30 mEq/L (ref 19–32)
Chloride: 97 mEq/L (ref 96–112)
Creatinine, Ser: 1.22 mg/dL (ref 0.50–1.35)
GFR calc Af Amer: 60 mL/min — ABNORMAL LOW (ref >90)
GFR calc non Af Amer: 52 mL/min — ABNORMAL LOW (ref >90)
Glucose, Bld: 218 mg/dL — ABNORMAL HIGH (ref 70–99)
Potassium: 3.9 mEq/L (ref 3.5–5.1)
Sodium: 137 mEq/L (ref 135–145)
Total Bilirubin: 0.4 mg/dL (ref 0.3–1.2)
Total Protein: 6.6 g/dL (ref 6.0–8.3)

## 2012-11-07 LAB — HEMOGLOBIN A1C
Hgb A1c MFr Bld: 8.8 % — ABNORMAL HIGH (ref <5.7)
Mean Plasma Glucose: 206 mg/dL — ABNORMAL HIGH (ref <117)

## 2012-11-07 LAB — CBC WITH DIFFERENTIAL/PLATELET
Basophils Absolute: 0 10*3/uL (ref 0.0–0.1)
Basophils Relative: 0 % (ref 0–1)
Eosinophils Absolute: 0.2 10*3/uL (ref 0.0–0.7)
MCH: 30.5 pg (ref 26.0–34.0)
MCHC: 34.5 g/dL (ref 30.0–36.0)
Monocytes Relative: 10 % (ref 3–12)
Neutrophils Relative %: 73 % (ref 43–77)
Platelets: 178 10*3/uL (ref 150–400)
RDW: 13.4 % (ref 11.5–15.5)

## 2012-11-07 LAB — PRO B NATRIURETIC PEPTIDE: Pro B Natriuretic peptide (BNP): 2007 pg/mL — ABNORMAL HIGH (ref 0–450)

## 2012-11-07 LAB — PROTIME-INR
INR: 1.03 (ref 0.00–1.49)
Prothrombin Time: 13.4 seconds (ref 11.6–15.2)

## 2012-11-07 LAB — APTT: aPTT: 34 seconds (ref 24–37)

## 2012-11-07 MED ORDER — FUROSEMIDE 10 MG/ML IJ SOLN: 60.0000 mg | Freq: Once | INTRAMUSCULAR | Status: DC

## 2012-11-07 MED ORDER — ZOLPIDEM TARTRATE 5 MG PO TABS
5.0000 mg | ORAL_TABLET | Freq: Every evening | ORAL | Status: DC | PRN
Start: 2012-11-07 — End: 2012-11-12
  Administered 2012-11-07 – 2012-11-11 (×5): 5 mg via ORAL
  Filled 2012-11-07 (×5): qty 1

## 2012-11-07 MED ORDER — GLUCOSAMINE-CHONDROITIN 500-400 MG PO TABS: 1.0000 | ORAL_TABLET | Freq: Two times a day (BID) | ORAL | Status: DC

## 2012-11-07 MED ORDER — APIXABAN 5 MG PO TABS
5.0000 mg | ORAL_TABLET | Freq: Two times a day (BID) | ORAL | Status: DC
  Administered 2012-11-07 – 2012-11-12 (×11): 5 mg via ORAL
  Filled 2012-11-07 (×13): qty 1

## 2012-11-07 MED ORDER — ZOLPIDEM TARTRATE 5 MG PO TABS: 5.0000 mg | ORAL_TABLET | Freq: Every evening | ORAL | Status: DC | PRN

## 2012-11-07 MED ORDER — SODIUM CHLORIDE 0.9 % IJ SOLN: 3.0000 mL | INTRAMUSCULAR | Status: DC | PRN

## 2012-11-07 MED ORDER — SODIUM CHLORIDE 0.9 % IJ SOLN
10.0000 mL | INTRAMUSCULAR | Status: DC | PRN
  Administered 2012-11-08 – 2012-11-12 (×5): 10 mL

## 2012-11-07 MED ORDER — SODIUM CHLORIDE 0.9 % IJ SOLN
3.0000 mL | Freq: Two times a day (BID) | INTRAMUSCULAR | Status: DC
  Administered 2012-11-10: 3 mL via INTRAVENOUS

## 2012-11-07 MED ORDER — PSYLLIUM 95 % PO PACK
1.0000 | PACK | Freq: Every day | ORAL | Status: DC
  Administered 2012-11-07 – 2012-11-08 (×2): 1 via ORAL
  Administered 2012-11-09: 11:00:00 via ORAL
  Administered 2012-11-10 – 2012-11-12 (×3): 1 via ORAL
  Filled 2012-11-07 (×6): qty 1

## 2012-11-07 MED ORDER — METFORMIN HCL 500 MG PO TABS
500.0000 mg | ORAL_TABLET | Freq: Two times a day (BID) | ORAL | Status: DC
  Administered 2012-11-07 – 2012-11-12 (×11): 500 mg via ORAL
  Filled 2012-11-07 (×12): qty 1

## 2012-11-07 MED ORDER — FUROSEMIDE 10 MG/ML IJ SOLN
60.0000 mg | Freq: Once | INTRAMUSCULAR | Status: AC
  Administered 2012-11-07: 60 mg via INTRAVENOUS
  Filled 2012-11-07: qty 6

## 2012-11-07 MED ORDER — SODIUM CHLORIDE 0.9 % IV SOLN: 250.0000 mL | INTRAVENOUS | Status: DC | PRN

## 2012-11-07 MED ORDER — INSULIN ASPART 100 UNIT/ML ~~LOC~~ SOLN
0.0000 [IU] | Freq: Three times a day (TID) | SUBCUTANEOUS | Status: DC
  Administered 2012-11-07 – 2012-11-08 (×2): 7 [IU] via SUBCUTANEOUS
  Administered 2012-11-08: 3 [IU] via SUBCUTANEOUS
  Administered 2012-11-08 – 2012-11-09 (×2): 4 [IU] via SUBCUTANEOUS
  Administered 2012-11-09: 7 [IU] via SUBCUTANEOUS
  Administered 2012-11-09: 4 [IU] via SUBCUTANEOUS
  Administered 2012-11-10 (×2): 7 [IU] via SUBCUTANEOUS
  Administered 2012-11-10: 4 [IU] via SUBCUTANEOUS
  Administered 2012-11-11: 3 [IU] via SUBCUTANEOUS
  Administered 2012-11-11: 4 [IU] via SUBCUTANEOUS
  Administered 2012-11-11: 7 [IU] via SUBCUTANEOUS
  Administered 2012-11-12: 3 [IU] via SUBCUTANEOUS
  Administered 2012-11-12: 11 [IU] via SUBCUTANEOUS
  Administered 2012-11-12: 4 [IU] via SUBCUTANEOUS

## 2012-11-07 MED ORDER — GLIMEPIRIDE 2 MG PO TABS
2.0000 mg | ORAL_TABLET | Freq: Every day | ORAL | Status: DC
  Administered 2012-11-08 – 2012-11-12 (×5): 2 mg via ORAL
  Filled 2012-11-07 (×6): qty 1

## 2012-11-07 MED ORDER — SIMVASTATIN 40 MG PO TABS
40.0000 mg | ORAL_TABLET | Freq: Every day | ORAL | Status: DC
  Administered 2012-11-07: 40 mg via ORAL
  Filled 2012-11-07 (×2): qty 1

## 2012-11-07 MED ORDER — METFORMIN HCL 500 MG PO TABS
500.0000 mg | ORAL_TABLET | Freq: Two times a day (BID) | ORAL | Status: DC
  Filled 2012-11-07: qty 1

## 2012-11-07 MED ORDER — LORATADINE 10 MG PO TABS
10.0000 mg | ORAL_TABLET | Freq: Every day | ORAL | Status: DC
  Administered 2012-11-08 – 2012-11-12 (×5): 10 mg via ORAL
  Filled 2012-11-07 (×6): qty 1

## 2012-11-07 MED ORDER — DILTIAZEM HCL 100 MG IV SOLR
5.0000 mg/h | INTRAVENOUS | Status: DC
  Administered 2012-11-07: 10 mg/h via INTRAVENOUS
  Administered 2012-11-07: 5 mg/h via INTRAVENOUS

## 2012-11-07 MED ORDER — ACETAMINOPHEN 325 MG PO TABS
650.0000 mg | ORAL_TABLET | ORAL | Status: DC | PRN
  Administered 2012-11-07: 650 mg via ORAL
  Filled 2012-11-07: qty 2

## 2012-11-07 MED ORDER — SODIUM CHLORIDE 0.9 % IJ SOLN
10.0000 mL | Freq: Two times a day (BID) | INTRAMUSCULAR | Status: DC
  Administered 2012-11-07: 10 mL
  Administered 2012-11-08: 20 mL
  Administered 2012-11-08: 10 mL

## 2012-11-07 NOTE — H&P (Signed)
History and Physical   Admit date: 11/07/2012 Name:  [name] Medical record number: [id number] DOB/Age:  [id number]  79 y.o. male  Referring Physician:  [facility]  Primary Physician: Dr. Larina Earthly  Chief complaint/reason for admission: Shortness of breath, rapid heartbeat  HPI:  79 year old male has a prior history of COPD, obstructive sleep apnea, hypertension, diabetes with neuropathy, and a prior history of lung cancer. He was treated with chemotherapy and radiation therapy which stopped in January 2013. He has a previous history of a deep venous thrombosis complicating his chemotherapy. He states that he had transient atrial fibrillation during his chemotherapy that resolved. He saw Dr. Deborah Chalk several years ago and states he had a catheterization for unclear reasons and was told that he had had 2 heart attacks in the past at some point. He has never had anginal pain. He has some mild chronic dyspnea but about 2 weeks ago had an episode of dizziness, felt irregular heartbeat and became very lightheaded while in his car.  His had significant dyspnea that worsened over the past couple of days and came to the emergency room this morning where he was found to be in rapid atrial fibrillation. He was given a single injection of diltiazem and cardiology was asked to see him. He does not have edema and denies PND, orthopnea, or anginal pain. He has no history of claudication.    Past Medical History  Diagnosis Date  . DVT (deep venous thrombosis)     IN RIGHT ARM 11/2010   . BPH (benign prostatic hyperplasia)   . COPD (chronic obstructive pulmonary disease)        . Hyperlipidemia   . Allergic rhinitis   . Allergic conjunctivitis   . DJD (degenerative joint disease)     Left Knee  . Pneumonia     history  . Diverticulosis     hx  . Cellulitis     hx  . Hearing loss   . Lung cancer      history of stage IIIA non-small cell lung cancer who is currently being treated  with both  Radiation and Chemotherapy  . Cataract   . Diabetic peripheral neuropathy   . Hypertension   . Obstructive sleep apnea (adult)  10/29/2012    This patient has mild obstructive sleep apnea, tested in March 2014 at [facility] sleep. He had been a CPAP user for 14 years. AHi 10.7 He was titrated to a pressure of 9 cm water( after auto - titration). CPAP at night  --- 8-10 YR AGO....,OSA-diagnosed 2000  . Secondary diabetes with peripheral neuropathy 11/07/2012       Past Surgical History  Procedure Laterality Date  . Insertion of a left subclavian port-a-cath.  12/17/10    [place]  . Portacath placement  04/23/2011    Procedure: INSERTION PORT-A-CATH;  Surgeon: Norton Blizzard, MD;  [facility] OR;  Service: Thoracic;;  Revision of  Porta-Cath  . Fiberoptic bronchoscopy  12/11/2010  . Video bronchoscopy  09/27/2011    Procedure: VIDEO BRONCHOSCOPY;  Surgeon: Ines Bloomer, MD;  Location: [facility] OR;  Service: Thoracic;  Laterality: N/A;  . Deviated septum repair    . Cardiac catheterization  2006    Allergies: has No Known Allergies.   Medications: Prior to Admission medications   Medication Sig Start Date End Date Taking? Authorizing Provider  aspirin 81 MG tablet Take 81 mg by mouth 2 (two) times daily.   Yes Historical Provider, MD  cetirizine (ZYRTEC) 10 MG tablet Take 10 mg by mouth every morning.    Yes Historical Provider, MD  glimepiride (AMARYL) 2 MG tablet Take 2 mg by mouth daily as needed. Take when blood sugar is > 150   Yes Historical Provider, MD  glucosamine-chondroitin 500-400 MG tablet Take 1 tablet by mouth 2 (two) times daily.    Yes Historical Provider, MD  insulin lispro (HUMALOG) 100 UNIT/ML injection Inject into the skin 3 (three) times daily as needed. Use as directed.  BS 150-200  Take 2units BS 201-250  4 units. BS 251 - 300 6units. BS  301-350 8 units.   Yes Historical Provider, MD  metFORMIN (GLUCOPHAGE) 500 MG tablet Take 500 mg by mouth 2 (two) times  daily.   Yes Historical Provider, MD  Psyllium (METAMUCIL PO) Take 2 capsules by mouth daily as needed. For constipation   Yes Historical Provider, MD  simvastatin (ZOCOR) 40 MG tablet Take 40 mg by mouth at bedtime.    Yes Historical Provider, MD  zolpidem (AMBIEN) 10 MG tablet Take 5 mg by mouth at bedtime as needed for sleep. 10/30/11  Yes Josph Macho, MD    Family History:  Family Status  Relation Status Death Age  . Father Deceased 42    heart attack  . Mother Deceased 37    parkinson's    Social History:   reports that he quit smoking about 20 years ago. His smoking use included Cigarettes. He has a 50 pack-year smoking history. He quit smokeless tobacco use about 2 years ago. He reports that he does not drink alcohol or use illicit drugs.   History   Social History Narrative  . No narrative on file     Review of Systems: He complained of a black spot in his right visual fields this has been present for a couple of weeks when he looks up. He has some shortness of breath and no productive cough. He denies diarrhea or constipation. He has mild nocturia and feels as if he cannot empty his bladder completely. He complains of some mild arthritis involving his finger on the left hand and also has a history of some arthritis involving his left knee. He has numbness in his legs and has diabetic peripheral neuropathy. He has some insomnia. Other than as noted above, the remainder of the review of systems is normal  Physical Exam: BP 112/79  Pulse 90  Temp(Src) 97.6 F (36.4 C) (Oral)  Resp 20  SpO2 93%  General appearance: alert, cooperative, no distress and Appears younger than stated age Head: Balding male hair pattern Eyes: conjunctivae/corneas clear. PERRL, EOM's intact. Fundi benign. Neck: no adenopathy, no carotid bruit, no JVD and supple, symmetrical, trachea midline Lungs: Increased AP diameter, bilateral expiratory wheezing Heart: Irregular rhythm, normal S1-S2, no  S3, no murmur Abdomen: Mildly distended, soft, nontender without mass or organomegaly. Rectal: deferred Extremities: extremities normal, atraumatic, no cyanosis or edema Pulses: 2+ and symmetric Skin: Skin color, texture, turgor normal. No rashes or lesions Neurologic: Grossly normal  Labs: CBC  Recent Labs  11/07/12 0957  WBC 6.5  RBC 5.02  HGB 15.3  HCT 44.3  PLT 178  MCV 88.2  MCH 30.5  MCHC 34.5  RDW 13.4  LYMPHSABS 0.9  MONOABS 0.7  EOSABS 0.2  BASOSABS 0.0   CMP   Recent Labs  11/07/12 0957  NA 137  K 3.9  CL 100  CO2 28  GLUCOSE 258*  BUN 14  CREATININE  1.12  CALCIUM 8.9  PROT 6.6  ALBUMIN 3.0*  AST 15  ALT 10  ALKPHOS 93  BILITOT 0.5  GFRNONAA 57*  GFRAA 67*   BNP (last 3 results)  Recent Labs  11/07/12 0958  PROBNP 2007.0*   Thyroid  Lab Results  Component Value Date   TSH 1.942 12/08/2010    EKG: Atrial fibrillation with rapid response  Radiology: Hyperinflation and chronic interstitial thickening, small bilateral pleural effusions   IMPRESSIONS: 1. Atrial fibrillation of undetermined age of onset 2. Hypertension 3. 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 Obesity 5. Hyperlipidemia 6. History of stage III non-small cell lung cancer treated with radiation and chemotherapy 7. Obstructive sleep apnea 8. COPD 9. Probable diastolic congestive heart failure with mild elevation of BNP  PLAN: Initiate anticoagulation with Eliquus. His CHADS2VASC score is  4. Obtain echocardiogram and TSH. Intravenous diltiazem. The duration of atrial fibrillation is unknown at this time.  Signed: Darden Palmer MD [facility]  11/07/2012, 12:47 PM

## 2012-11-07 NOTE — ED Provider Notes (Addendum)
History     CSN: [id number]  Arrival date & time 11/07/12  0929   First MD Initiated Contact with Patient 11/07/12 [phone]      Chief Complaint  Patient presents with  . Shortness of Breath    (Consider location/radiation/quality/duration/timing/severity/associated sxs/prior treatment) HPI Comments: Patient states approximately 2 weeks ago he had a near syncopal episode when he was driving home from church and his heart felt" Funny."  He states since that time he has never felt 100% back to his normal self. He states that he does not usually have palpitations but did one prior time when he was in atrial fibrillation after receiving chemotherapy for his lung cancer that resolved within a few hours.  Over the last 2 days he's had worsening shortness of breath to the point that he cannot even walk to his bathroom without being winded.  He states this is very unusual for him. He is having a mild dry cough but nothing out of the ordinary.  He denies any chest pain but states he will occasionally feel palpitations and is also feeling distention in his abdomen but has not weighed himself to know if he has gained weight.  Patient is a 79 y.o. male presenting with shortness of breath. The history is provided by the patient.  Shortness of Breath Severity:  Severe Onset quality:  Gradual Duration:  2 days Timing:  Constant Progression:  Worsening Chronicity:  New Context: activity   Relieved by:  Rest and sitting up Worsened by:  Exertion (lying down) Ineffective treatments:  None tried Associated symptoms: cough and wheezing   Associated symptoms: no abdominal pain, no chest pain, no fever, no syncope and no vomiting   Associated symptoms comment:  Abdominal distention Risk factors: hx of cancer   Risk factors: no prolonged immobilization, no recent surgery and no tobacco use     Past Medical History  Diagnosis Date  . Diabetes mellitus   . Lung mass   . DVT (deep venous thrombosis)      IN RIGHT ARM 11/2010   . BPH (benign prostatic hyperplasia)   . COPD (chronic obstructive pulmonary disease)        . Hyperlipidemia   . Allergic rhinitis   . Allergic conjunctivitis   . DJD (degenerative joint disease)     Left Knee  . Sleep apnea     CPAP at night  --- 8-10 YR AGO....,OSA-diagnosed 2000  . Shortness of breath   . Pneumonia     history  . Diverticulosis     hx  . Cellulitis     hx  . Hearing loss   . Lung cancer      history of stage IIIA non-small cell lung cancer who is currently being treated with both  Radiation and Chemotherapy  . Cataract   . Conjunctivitis   . Diabetic peripheral neuropathy   . Hypertension   . Hypertrophic prostatitis     with obstruction    Past Surgical History  Procedure Laterality Date  . Insertion of a left subclavian port-a-cath.  12/17/10    [place]  . Portacath placement  04/23/2011    Procedure: INSERTION PORT-A-CATH;  Surgeon: Norton Blizzard, MD;  Location: [facility] OR;  Service: Thoracic;;  Revision of  Porta-Cath  . Fiberoptic bronchoscopy  12/11/2010  . Video bronchoscopy  09/27/2011    Procedure: VIDEO BRONCHOSCOPY;  Surgeon: Ines Bloomer, MD;  Location: [facility] OR;  Service: Thoracic;  Laterality: N/A;  . Deviated septum  repair    . Cardiac catheterization  2006  . Portacath placement      Family History  Problem Relation Age of Onset  . Coronary artery disease    . Cancer    . Multiple sclerosis    . Diabetes type II    . Anesthesia problems Neg Hx   . Hypotension Neg Hx   . Malignant hyperthermia Neg Hx   . Pseudochol deficiency Neg Hx     History  Substance Use Topics  . Smoking status: Former Smoker -- 1.00 packs/day for 50 years    Types: Cigarettes    Quit date: 06/10/1992  . Smokeless tobacco: Former Neurosurgeon    Quit date: 08/01/2010  . Alcohol Use: No      Review of Systems  Constitutional: Negative for fever and chills.  Respiratory: Positive for cough, shortness of breath and wheezing.    Cardiovascular: Positive for palpitations. Negative for chest pain, leg swelling and syncope.  Gastrointestinal: Negative for nausea, vomiting, abdominal pain and diarrhea.  All other systems reviewed and are negative.    Allergies  Review of patient's allergies indicates no known allergies.  Home Medications   Current Outpatient Rx  Name  Route  Sig  Dispense  Refill  . aspirin 81 MG tablet   Oral   Take 81 mg by mouth 2 (two) times daily.         . cetirizine (ZYRTEC) 10 MG tablet   Oral   Take 10 mg by mouth every morning.          Marland Kitchen glimepiride (AMARYL) 2 MG tablet   Oral   Take 2 mg by mouth daily as needed. Take when blood sugar is > 150         . glucosamine-chondroitin 500-400 MG tablet   Oral   Take 2 tablets by mouth every morning.          . insulin lispro (HUMALOG) 100 UNIT/ML injection   Subcutaneous   Inject into the skin 3 (three) times daily as needed. Use as directed.  BS 150-200  Take 2units BS 201-250  4 units. BS 251 - 300 6units. BS  301-350 8 units.         . metFORMIN (GLUCOPHAGE) 500 MG tablet   Oral   Take 500 mg by mouth 2 (two) times daily.         . Psyllium (METAMUCIL PO)   Oral   Take 2 capsules by mouth daily as needed. For constipation         . simvastatin (ZOCOR) 40 MG tablet   Oral   Take 40 mg by mouth at bedtime.          Marland Kitchen zolpidem (AMBIEN) 10 MG tablet      Take 1/2 tab nightly as needed   30 tablet   2     BP 94/68  Pulse 52  Temp(Src) 97.6 F (36.4 C) (Oral)  Resp 18  SpO2 93%  Physical Exam  Nursing note and vitals reviewed. Constitutional: He is oriented to person, place, and time. He appears well-developed and well-nourished. No distress.  HENT:  Head: Normocephalic and atraumatic.  Mouth/Throat: Oropharynx is clear and moist.  Eyes: Conjunctivae and EOM are normal. Pupils are equal, round, and reactive to light.  Neck: Normal range of motion. Neck supple.  Cardiovascular: Normal rate,  regular rhythm and intact distal pulses.   No murmur heard. Pulmonary/Chest: Effort normal. No respiratory distress. He has decreased breath  sounds. He has wheezes. He has no rales.  Abdominal: Soft. He exhibits distension. There is no tenderness. There is no rebound and no guarding.  Musculoskeletal: Normal range of motion. He exhibits no edema and no tenderness.  Neurological: He is alert and oriented to person, place, and time.  Skin: Skin is warm and dry. No rash noted. No erythema.  Psychiatric: He has a normal mood and affect. His behavior is normal.    ED Course  Procedures (including critical care time)  Labs Reviewed  COMPREHENSIVE METABOLIC PANEL - Abnormal; Notable for the following:    Glucose, Bld 258 (*)    Albumin 3.0 (*)    GFR calc non Af Amer 57 (*)    GFR calc Af Amer 67 (*)    All other components within normal limits  CBC WITH DIFFERENTIAL  PRO B NATRIURETIC PEPTIDE  POCT I-STAT TROPONIN I   Dg Chest 2 View  11/07/2012   *RADIOLOGY REPORT*  Clinical Data: Shortness of breath.  Cough.  Congestion. Tachycardia.  History of lung cancer.  CHEST - 2 VIEW  Comparison: 07/28/2012 CT.  Most recent plain film 10/16/2011.  Findings: Lateral view degraded by patient arm position.  Port-A-Cath which terminates at the mid to low SVC.  Mild cardiomegaly with a tortuous thoracic aorta.  Small bilateral pleural effusions, slightly larger on the right than left. No pneumothorax.  Chronic interstitial thickening.  Slightly lower lobe predominant.  Since 10/16/2011, increased density at the right lung base.  IMPRESSION: Hyperinflation and chronic interstitial thickening, likely related to COPD/chronic bronchitis.  Small bilateral pleural effusions.  Somewhat more confluent opacity at the right lung base.  This could represent atelectasis or early infection.  Consider short-term radiographic follow-up.   Original Report Authenticated By: Jeronimo Greaves, M.D.     Date: 11/07/2012  Rate:  128  Rhythm: atrial fibrillation with RVR  QRS Axis: normal  Intervals: normal  ST/T Wave abnormalities: nonspecific ST/T changes  Conduction Disutrbances:none  Narrative Interpretation:   Old EKG Reviewed: changes noted  Prior EKG 09/16/11 NSR   1. CHF (congestive heart failure), acute on chronic, systolic   2. Atrial fibrillation       MDM   Patient presents to do to worsening shortness of breath, abdominal bloating and irregular heartbeat.  Today patient's found to be in atrial fibrillation with a rate going from 52 to 130. Patient states 2 weeks ago he had a near syncopal event with a funny feeling in his heart that has never completely gone away.  However over the last 2 days he's had worsening shortness of breath which is worse with lying flat, bending over and exertion. Now he is unable to walk to the bathroom without getting winded. On arrival here he was satting 93% on room air with improvement when given 2 L of oxygen.  Exam shows an irregularly irregular heartbeat, abdominal distention without lower extremity edema and diffuse wheezing.  Feel most likely patient has CHF due to persistent atrial fibrillation. Patient denies any infectious etiology. In spite having a history of lung cancer he does not take any respiratory medications regularly and denies chronic wheezing.  CBC, CMP, BNP, troponin, chest x-ray pending.  11:21 AM Imaging and labs confirming CHF with new A. fib. We'll discuss with cardiology for diuresis and will need anticoagulation prior to any cardioversion     Gwyneth Sprout, MD 11/07/12 1121  Gwyneth Sprout, MD 11/07/12 1142

## 2012-11-07 NOTE — Progress Notes (Signed)
  Echocardiogram 2D Echocardiogram has been performed.  [name] 11/07/2012, 2:55 PM

## 2012-11-07 NOTE — ED Notes (Addendum)
Pt c/o increased SOB and CP x 2 days; pt noted to have irregular heart rate; per family afib x 1 occurrence during chemo several years ago

## 2012-11-07 NOTE — Progress Notes (Signed)
PHARMACIST - PHYSICIAN ORDER COMMUNICATION  CONCERNING: P&T Medication Policy on Herbal Medications  DESCRIPTION:  This patient's order for:  Glucosamine/Chondroitin  has been noted.  This product(s) is classified as an "herbal" or natural product. Due to a lack of definitive safety studies or FDA approval, nonstandard manufacturing practices, plus the potential risk of unknown drug-drug interactions while on inpatient medications, the Pharmacy and Therapeutics Committee does not permit the use of "herbal" or natural products of this type within [facility].   ACTION TAKEN: The pharmacy department is unable to verify this order at this time and your patient has been informed of this safety policy. Please reevaluate patient's clinical condition at discharge and address if the herbal or natural product(s) should be resumed at that time.  Thanks, Atiba Kimberlin K. Allena Katz, PharmD, BCPS.  Clinical Pharmacist Pager [phone]. 11/07/2012 2:07 PM

## 2012-11-08 LAB — LIPID PANEL
Cholesterol: 117 mg/dL (ref 0–200)
HDL: 45 mg/dL (ref >39)
Total CHOL/HDL Ratio: 2.6 RATIO
VLDL: 24 mg/dL (ref 0–40)

## 2012-11-08 LAB — GLUCOSE, CAPILLARY
Glucose-Capillary: 211 mg/dL — ABNORMAL HIGH (ref 70–99)
Glucose-Capillary: 180 mg/dL — ABNORMAL HIGH (ref 70–99)

## 2012-11-08 LAB — BASIC METABOLIC PANEL
BUN: 20 mg/dL (ref 6–23)
CO2: 31 mEq/L (ref 19–32)
Chloride: 96 mEq/L (ref 96–112)
Creatinine, Ser: 1.2 mg/dL (ref 0.50–1.35)
Potassium: 4 mEq/L (ref 3.5–5.1)

## 2012-11-08 MED ORDER — FUROSEMIDE 40 MG PO TABS
40.0000 mg | ORAL_TABLET | Freq: Every day | ORAL | Status: DC
  Administered 2012-11-08 – 2012-11-12 (×5): 40 mg via ORAL
  Filled 2012-11-08 (×5): qty 1

## 2012-11-08 MED ORDER — DILTIAZEM HCL ER COATED BEADS 240 MG PO CP24: 240.0000 mg | ORAL_CAPSULE | Freq: Every day | ORAL | Status: DC

## 2012-11-08 MED ORDER — SIMVASTATIN 10 MG PO TABS
10.0000 mg | ORAL_TABLET | Freq: Every day | ORAL | Status: DC
  Administered 2012-11-08 – 2012-11-11 (×4): 10 mg via ORAL
  Filled 2012-11-08 (×5): qty 1

## 2012-11-08 MED ORDER — DILTIAZEM HCL ER COATED BEADS 240 MG PO CP24
240.0000 mg | ORAL_CAPSULE | Freq: Every day | ORAL | Status: DC
  Administered 2012-11-08 – 2012-11-12 (×5): 240 mg via ORAL
  Filled 2012-11-08 (×5): qty 1

## 2012-11-08 NOTE — Progress Notes (Signed)
Pt 's O2 sat on room air is 94%. After ambulating about 30 feet in hallway, O2 sat dropped to 89% and was SOB with some wheezing. Recovered quickly to 92% on room air while  at rest.

## 2012-11-08 NOTE — Progress Notes (Signed)
Pt had a change of rhythm ECG done showed Afib no s/s pt BP 147/78, HR 81, MD notified, will continue to monitor, Lavonda Jumbo RN

## 2012-11-08 NOTE — Progress Notes (Signed)
Subjective:  Feels like dyspnea is better.  He converted to NSR last night.  No chest pain.   Objective:  Vital Signs in the last 24 hours: BP 98/65  Pulse 71  Temp(Src) 97.6 F (36.4 C) (Oral)  Resp 21  Ht 5\' 10"  (1.778 m)  Wt 105.6 kg (232 lb 12.9 oz)  BMI 33.4 kg/m2  SpO2 96%  Physical Exam: Pleasant WM in NAD Lungs:  Clear  Cardiac:  Regular rhythm, normal S1 and S2, no S3 Abdomen:  Soft, nontender, no masses Extremities:  No edema present  Intake/Output from previous day: 05/31 0701 - 06/01 0700 In: 1089.3 [P.O.:765; I.V.:324.3] Out: 1850 [Urine:1850]  Weight Filed Weights   11/07/12 1700  Weight: 105.6 kg (232 lb 12.9 oz)    Lab Results: Basic Metabolic Panel:  Recent Labs  11/91/47 1312 11/08/12 0514  NA 137 134*  K 4.1 4.0  CL 97 96  CO2 30 31  GLUCOSE 218* 198*  BUN 15 20  CREATININE 1.22 1.20   CBC:  Recent Labs  11/07/12 0957  WBC 6.5  NEUTROABS 4.7  HGB 15.3  HCT 44.3  MCV 88.2  PLT 178   Telemetry: Sinus rhythm.  Converted from a fib overnight.  ECHO:  Normal LV function.  Left atrium not greatly enlarged.  Mild aortic root enlargement. Assessment/Plan:  1. Atrial fibrillation with conversion to NSR 2. Anticoagulation with Eliquus 3. Diabetes 4. Sleep apnea  Rec:  Change to po cardiazem and move to floor. Continue Eliquus.  If stable hopefully d/c in am.   W. Ashley Royalty  MD [facility]  11/08/2012, 9:24 AM

## 2012-11-08 NOTE — Progress Notes (Signed)
Report from Night RN. Chart reviewed together. Handoff complete.Introductions complete. Will continue to monitor and advise attending as needed.   

## 2012-11-09 LAB — BASIC METABOLIC PANEL
BUN: 19 mg/dL (ref 6–23)
CO2: 31 mEq/L (ref 19–32)
Chloride: 94 mEq/L — ABNORMAL LOW (ref 96–112)
Glucose, Bld: 216 mg/dL — ABNORMAL HIGH (ref 70–99)
Potassium: 3.8 mEq/L (ref 3.5–5.1)

## 2012-11-09 LAB — GLUCOSE, CAPILLARY
Glucose-Capillary: 231 mg/dL — ABNORMAL HIGH (ref 70–99)
Glucose-Capillary: 175 mg/dL — ABNORMAL HIGH (ref 70–99)

## 2012-11-09 MED ORDER — DOCUSATE SODIUM 100 MG PO CAPS
100.0000 mg | ORAL_CAPSULE | Freq: Two times a day (BID) | ORAL | Status: DC
  Administered 2012-11-09 – 2012-11-12 (×6): 100 mg via ORAL
  Filled 2012-11-09 (×8): qty 1

## 2012-11-09 MED ORDER — AMIODARONE HCL 200 MG PO TABS
400.0000 mg | ORAL_TABLET | Freq: Every day | ORAL | Status: DC
  Administered 2012-11-09 – 2012-11-12 (×4): 400 mg via ORAL
  Filled 2012-11-09 (×4): qty 2

## 2012-11-09 MED ORDER — LEVALBUTEROL HCL 0.63 MG/3ML IN NEBU
0.6300 mg | INHALATION_SOLUTION | Freq: Four times a day (QID) | RESPIRATORY_TRACT | Status: DC | PRN
  Administered 2012-11-09 – 2012-11-12 (×2): 0.63 mg via RESPIRATORY_TRACT
  Filled 2012-11-09: qty 3

## 2012-11-09 NOTE — Progress Notes (Signed)
11/09/12 1600 Noted pt. started on new medicine Eliquis.  Benefits check completed with pt.'s insurance and found that Eliquis will cost pt. $70/30 day supply.  Medicine needs prior authorization for quanity of medicine.  Physician, please call [phone].  NCM will follow for dc needs.  Tera Mater, RN, BSN NCM [phone]

## 2012-11-09 NOTE — Progress Notes (Signed)
Pt having SOB on exertion and some wheezing called MD, MD ordered breathing tx PRN and C-pap at night, gave breathing tx and RT set up c-pap for pt, will continue to monitor, Thanks, Lavonda Jumbo RN

## 2012-11-09 NOTE — Progress Notes (Signed)
Utilization Review Completed.   Marit Goodwill, RN, BSN Nurse Case Manager  [phone]  

## 2012-11-09 NOTE — Progress Notes (Signed)
Inpatient Diabetes Program Recommendations  AACE/ADA: New Consensus Statement on Inpatient Glycemic Control (2013)  Target Ranges:  Prepandial:   less than 140 mg/dL      Peak postprandial:   less than 180 mg/dL (1-2 hours)      Critically ill patients:  140 - 180 mg/dL     Results for IMMANUEL, FEDAK (MRN [id number]) as of 11/09/2012 12:17  Ref. Range 11/08/2012 07:44 11/08/2012 11:26 11/08/2012 16:19 11/08/2012 21:11  Glucose-Capillary Latest Range: 70-99 mg/dL 409 (H) 811 (H) 914 (H) 233 (H)    Results for GIANG, HEMME (MRN [id number]) as of 11/09/2012 12:17  Ref. Range 11/09/2012 06:18 11/09/2012 11:29  Glucose-Capillary Latest Range: 70-99 mg/dL 086 (H) 578 (H)    Patient having elevated fasting glucose levels.  May need low dose basal insulin while here in hospital to help better manage her CBGs.   Inpatient Diabetes Program Recommendations Insulin - Basal: Please consider adding low dose Levemir while patient here in hospital- Levemir 10 units QHS  Will follow. Ambrose Finland RN, MSN, CDE Diabetes Coordinator Inpatient Diabetes Program [phone]

## 2012-11-09 NOTE — Progress Notes (Signed)
Placed pt. On cpap as per order. Pt. Is tolerating well. RT and RN to monitor.

## 2012-11-09 NOTE — Progress Notes (Signed)
Pt. Is wearing his home cpap. Pt. Can place himself on. Rt attached 02 tubing to flowmeter for pt. Pt. Has 3L of oxygen titrated into the cpap. Pt. Is tolerating well.

## 2012-11-09 NOTE — Progress Notes (Addendum)
Subjective:  Went back into atrial fibrillation yesterday.  Worsening SOB and some drop in sats with exercise.    Objective:  Vital Signs in the last 24 hours: BP 107/62  Pulse 81  Temp(Src) 97.4 F (36.3 C) (Oral)  Resp 20  Ht 5\' 10"  (1.778 m)  Wt 101.969 kg (224 lb 12.8 oz)  BMI 32.26 kg/m2  SpO2 97%  Physical Exam: Pleasant WM in NAD Lungs:  Clear  Cardiac: irregular rhythm, normal S1 and S2, no S3 Abdomen:  Soft, nontender, no masses Extremities:  No edema present  Intake/Output from previous day: 06/01 0701 - 06/02 0700 In: 780 [P.O.:740; I.V.:40] Out: 1825 [Urine:1825]  Weight Filed Weights   11/07/12 1700 11/08/12 1120 11/09/12 0533  Weight: 105.6 kg (232 lb 12.9 oz) 102.5 kg (225 lb 15.5 oz) 101.969 kg (224 lb 12.8 oz)    Lab Results: Basic Metabolic Panel:  Recent Labs  96/04/54 0514 11/09/12 0510  NA 134* 133*  K 4.0 3.8  CL 96 94*  CO2 31 31  GLUCOSE 198* 216*  BUN 20 19  CREATININE 1.20 1.17   CBC:  Recent Labs  11/07/12 0957  WBC 6.5  NEUTROABS 4.7  HGB 15.3  HCT 44.3  MCV 88.2  PLT 178   Telemetry: Atrial fibrillation recurrent  ECHO:  Normal LV function.  Left atrium not greatly enlarged.  Mild aortic root enlargement.  Assessment/Plan:  1. Atrial fibrillation recurrent 2. Anticoagulation with Eliquus 3. Diabetes 4. Sleep apnea  Rec:  Add amiodarone to try to maintain NSR.  Cancel d/c continue to monitor.   Darden Palmer  MD [facility]  11/09/2012, 10:14 AM

## 2012-11-10 LAB — BASIC METABOLIC PANEL
CO2: 30 mEq/L (ref 19–32)
Calcium: 9 mg/dL (ref 8.4–10.5)
Chloride: 94 mEq/L — ABNORMAL LOW (ref 96–112)
Creatinine, Ser: 1.32 mg/dL (ref 0.50–1.35)
Glucose, Bld: 196 mg/dL — ABNORMAL HIGH (ref 70–99)

## 2012-11-10 LAB — GLUCOSE, CAPILLARY
Glucose-Capillary: 240 mg/dL — ABNORMAL HIGH (ref 70–99)
Glucose-Capillary: 182 mg/dL — ABNORMAL HIGH (ref 70–99)

## 2012-11-10 NOTE — Progress Notes (Signed)
Spoke with Mr [name] and daughter, with patient's permission, at bedside to explain [facility] Care Management services. Mr [name] states he is not sure if he thinks he needs [facility] Care Management at present time. However, states he will call if he should change his mind in the future. Left contact information and brochure with patient. [name], MSN- Ed, Charity fundraiser, BSN- [facility] Liaison-[phone]

## 2012-11-10 NOTE — Progress Notes (Signed)
0600 Patient arrived to unit by stretcher with no IV pump and has oxygen at 2L [place]. Patient is able to ambulate from stretcher to bed with 1 person assist. She is A/O x 4 with no signs of distress and no complaints of pain. Patient started to experience nausea and vomiting during transfer. Pt.vomited a small amount of emesis and which was white in color. PRN zofran was given. Patient is currently resting in bed.

## 2012-11-10 NOTE — Progress Notes (Signed)
Patient placed himself on his CPAP from home QHS.  Patient tolerating CPAP well.

## 2012-11-10 NOTE — Progress Notes (Signed)
Subjective:  Not as much SOB today and feels better although still in atrial fib with rate up some.  Objective:  Vital Signs in the last 24 hours: BP 116/60  Pulse 104  Temp(Src) 97.9 F (36.6 C) (Oral)  Resp 20  Ht 5\' 10"  (1.778 m)  Wt 101.606 kg (224 lb)  BMI 32.14 kg/m2  SpO2 96%  Physical Exam: Pleasant WM in NAD Lungs:  Clear  Cardiac: irregular rhythm, normal S1 and S2, no S3 Abdomen:  Soft, nontender, no masses Extremities:  No edema present  Intake/Output from previous day: 06/02 0701 - 06/03 0700 In: 1200 [P.O.:1200] Out: 375 [Urine:375]  Weight Filed Weights   11/09/12 0533 11/10/12 0500 11/10/12 0551  Weight: 101.969 kg (224 lb 12.8 oz) 101.606 kg (224 lb) 101.606 kg (224 lb)    Lab Results: Basic Metabolic Panel:  Recent Labs  16/10/96 0510 11/10/12 0539  NA 133* 134*  K 3.8 3.8  CL 94* 94*  CO2 31 30  GLUCOSE 216* 196*  BUN 19 23  CREATININE 1.17 1.32   Telemetry: Atrial fibrillation on tele at present.  ECHO:  Normal LV function.  Left atrium not greatly enlarged.  Mild aortic root enlargement.  Assessment/Plan:  1. Atrial fibrillation recurrent 2. Anticoagulation with Eliquus 3. Diabetes 4. Sleep apnea  Rec:  Continue amiodarone.  If he remains in a fib may try cardioversion prior to discharge.   Darden Palmer  MD [facility]  11/10/2012, 12:37 PM

## 2012-11-10 NOTE — Clinical Documentation Improvement (Signed)
  DOCUMENTATION CLARIFICATIONS ARE NOT PART OF THE PERMANENT MEDICAL RECORD   Please update your documentation within the medical record to reflect your response to this clarification.                                                                                    11/10/12  Dr. Donnie Aho,  In a better effort to capture your patient's severity of illness, reflect appropriate length of stay and utilization of resources, a review of the patient medical record has revealed the following information:  Admitted with Atrial Fibrillation of Unknown Duration - Heart Rate in the 90's to 110's on admission.   "9. Probable diastolic congestive heart failure with mild elevation of BNP"  H&P signed by Othella Boyer, MD at 11/07/2012 12:56 PM   Treated with IV Lasix that has now been transitioned to po Lasix.   11/07/12   Echo Study Conclusions - Left ventricle: The cavity size was normal. Systolic function was normal. The estimated ejection fraction was in the range of 50% to 55%. - Aortic valve: Trileaflet; normal thickness, mildly calcified leaflets. There was mild stenosis. Valve area: 1.45cm^2(VTI). Valve area: 1.54cm^2 (Vmax). - Aortic root: The aortic root was mildly dilated. - Right ventricle: The cavity size was mildly dilated. Wall thickness was normal.    Based on your clinical judgment, please document  whether or not the diagnosis of Diastolic Heart Failure was CONFIRMED or RULED OUT.  If the Diastolic  Heart Failure was confirmed, Please document the ACUITY of the Diastolic Heart Failure treated this admission in the progress notes and discharge summary.    Please exercise your independent judgment in responding to this clinical documentation clarification.     The fact that a clarification is asked, does not imply that any particular answer is desired or expected.    Reviewed:  no additional documentation provided  It was already in the chart! Placed there on  5/31  Thank You,  Jerral Ralph  RN BSN CCDS Certified Clinical Documentation Specialist: Cell   [phone]  Health Information Management [place]    TO RESPOND TO THE THIS QUERY, FOLLOW THE INSTRUCTIONS BELOW:  1. If needed, update documentation for the patient's encounter via the notes activity.  2. Access this query again and click edit on the In [employer].  3. After updating, or not, click F2 to complete all highlighted (required) fields concerning your review. Select "additional documentation in the medical record" OR "no additional documentation provided".  4. Click Sign note button.  5. The deficiency will fall out of your In Basket *Please let us know if you are not able to complete this workflow by phone or e-mail (listed below).

## 2012-11-11 ENCOUNTER — Ambulatory Visit: Payer: Medicare Other | Admitting: Hematology & Oncology

## 2012-11-11 LAB — BASIC METABOLIC PANEL
Calcium: 8.8 mg/dL (ref 8.4–10.5)
Creatinine, Ser: 1.26 mg/dL (ref 0.50–1.35)
GFR calc non Af Amer: 50 mL/min — ABNORMAL LOW (ref >90)
Glucose, Bld: 170 mg/dL — ABNORMAL HIGH (ref 70–99)
Sodium: 136 mEq/L (ref 135–145)

## 2012-11-11 LAB — GLUCOSE, CAPILLARY
Glucose-Capillary: 143 mg/dL — ABNORMAL HIGH (ref 70–99)
Glucose-Capillary: 139 mg/dL — ABNORMAL HIGH (ref 70–99)

## 2012-11-11 NOTE — Progress Notes (Signed)
Subjective:  He converted to sinus rhythm earlier today. He is still having some shortness of breath but states he had this prior to admission. No chest pain.  Objective:  Vital Signs in the last 24 hours: BP 117/81  Pulse 77  Temp(Src) 98.4 F (36.9 C) (Oral)  Resp 16  Ht 5\' 10"  (1.778 m)  Wt 101.651 kg (224 lb 1.6 oz)  BMI 32.15 kg/m2  SpO2 93%  Physical Exam: Pleasant WM in NAD Lungs:  Clear  Cardiac: Regular rhythm, normal S1 and S2, no S3 Abdomen:  Soft, nontender, no masses Extremities:  No edema present  Intake/Output from previous day: 06/03 0701 - 06/04 0700 In: 800 [P.O.:800] Out: 1500 [Urine:1500]  Weight Filed Weights   11/10/12 0500 11/10/12 0551 11/11/12 0447  Weight: 101.606 kg (224 lb) 101.606 kg (224 lb) 101.651 kg (224 lb 1.6 oz)    Lab Results: Basic Metabolic Panel:  Recent Labs  82/95/62 0539 11/11/12 0500  NA 134* 136  K 3.8 3.8  CL 94* 97  CO2 30 32  GLUCOSE 196* 170*  BUN 23 24*  CREATININE 1.32 1.26   Telemetry: Currently normal sinus rhythm.  ECHO:  Normal LV function.  Left atrium not greatly enlarged.  Mild aortic root enlargement.  Assessment/Plan:  1. Atrial fibrillation converted to sinus rhythm 2. Anticoagulation with Eliquus 3. Diabetes 4. Sleep apnea  Rec:  Currently in sinus rhythm. I will discontinue his oxygen and let him get up and walk the halls. He remains in sinus rhythm overnight consider discharge in morning.   Darden Palmer  MD [facility]  11/11/2012, 11:48 AM

## 2012-11-11 NOTE — Progress Notes (Signed)
Pt is wearing home CPAP. Pt places self on. RT attached 02 tubing to flowmeter for pt at 1.5 LPM titrated into the CPAP.

## 2012-11-12 ENCOUNTER — Inpatient Hospital Stay (HOSPITAL_COMMUNITY): Payer: Medicare Other

## 2012-11-12 DIAGNOSIS — G4733 Obstructive sleep apnea (adult) (pediatric): Secondary | ICD-10-CM

## 2012-11-12 DIAGNOSIS — I5031 Acute diastolic (congestive) heart failure: Secondary | ICD-10-CM

## 2012-11-12 DIAGNOSIS — C3490 Malignant neoplasm of unspecified part of unspecified bronchus or lung: Secondary | ICD-10-CM

## 2012-11-12 DIAGNOSIS — I509 Heart failure, unspecified: Secondary | ICD-10-CM

## 2012-11-12 DIAGNOSIS — J449 Chronic obstructive pulmonary disease, unspecified: Secondary | ICD-10-CM

## 2012-11-12 DIAGNOSIS — Z7901 Long term (current) use of anticoagulants: Secondary | ICD-10-CM

## 2012-11-12 DIAGNOSIS — I4891 Unspecified atrial fibrillation: Principal | ICD-10-CM

## 2012-11-12 LAB — BASIC METABOLIC PANEL
BUN: 20 mg/dL (ref 6–23)
Chloride: 96 mEq/L (ref 96–112)
Creatinine, Ser: 1.16 mg/dL (ref 0.50–1.35)
GFR calc Af Amer: 64 mL/min — ABNORMAL LOW (ref >90)
GFR calc non Af Amer: 55 mL/min — ABNORMAL LOW (ref >90)
Potassium: 3.8 mEq/L (ref 3.5–5.1)

## 2012-11-12 LAB — GLUCOSE, CAPILLARY
Glucose-Capillary: 179 mg/dL — ABNORMAL HIGH (ref 70–99)
Glucose-Capillary: 139 mg/dL — ABNORMAL HIGH (ref 70–99)
Glucose-Capillary: 152 mg/dL — ABNORMAL HIGH (ref 70–99)

## 2012-11-12 MED ORDER — APIXABAN 5 MG PO TABS: 5.0000 mg | ORAL_TABLET | Freq: Two times a day (BID) | ORAL | Status: AC

## 2012-11-12 MED ORDER — ATORVASTATIN CALCIUM 20 MG PO TABS: 20.0000 mg | ORAL_TABLET | Freq: Every day | ORAL | Status: DC

## 2012-11-12 MED ORDER — AMIODARONE HCL 200 MG PO TABS: 200.0000 mg | ORAL_TABLET | Freq: Two times a day (BID) | ORAL | Status: DC

## 2012-11-12 MED ORDER — HEPARIN SOD (PORK) LOCK FLUSH 100 UNIT/ML IV SOLN
500.0000 [IU] | INTRAVENOUS | Status: AC | PRN
  Administered 2012-11-12: 500 [IU]

## 2012-11-12 MED ORDER — FUROSEMIDE 40 MG PO TABS: 40.0000 mg | ORAL_TABLET | Freq: Every day | ORAL | Status: DC

## 2012-11-12 MED ORDER — ALBUTEROL SULFATE (5 MG/ML) 0.5% IN NEBU: 2.5000 mg | INHALATION_SOLUTION | Freq: Once | RESPIRATORY_TRACT | Status: DC

## 2012-11-12 MED ORDER — DILTIAZEM HCL ER COATED BEADS 240 MG PO CP24: 240.0000 mg | ORAL_CAPSULE | Freq: Every day | ORAL | Status: DC

## 2012-11-12 NOTE — Progress Notes (Signed)
SATURATION QUALIFICATIONS: (This note is used to comply with regulatory documentation for home oxygen)  Patient Saturations on Room Air at Rest = 95%  Patient Saturations on Room Air while Ambulating = 87%  Patient Saturations on 2 Liters of oxygen while Ambulating = 93%  Please briefly explain why patient needs home oxygen: Patient's oxygen dropped below 88% while ambulating, it was 87% without oxygen while ambulating. Therefore may need  oxygen when walking.

## 2012-11-12 NOTE — Consult Note (Signed)
PULMONARY  / CRITICAL CARE MEDICINE  Name: [name] MRN: [id number] DOB: 28-Jan-1925    ADMISSION DATE:  11/07/2012 CONSULTATION DATE:  11/12/12  REFERRING MD :  Dr. Donnie Aho PRIMARY SERVICE:  Cardiology  CHIEF COMPLAINT:  Desaturations / Hypoxia with ambulation  BRIEF PATIENT DESCRIPTION: 79 y/o M, former smoker,  admitted on 5/31 with 48 hours of worsening dyspnea.  On admit found to be in Afib with RVR with associated acute diastolic heart failure.  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  Pt noted to have desaturations with ambulation.  PCCM consulted for evaluation.   SIGNIFICANT EVENTS / STUDIES:    LINES / TUBES:   CULTURES:   ANTIBIOTICS:   HISTORY OF PRESENT ILLNESS:  79 y/o M, former smoker,  admitted on 5/31 with 48 hours of worsening dyspnea. 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  Prior to admit, he had been experiencing progressive dyspnea over a two week period with episodes of dizziness, palpitations and lightheadedness while driving. He also endorses swelling in his abdomen but denies LE swelling.  Notes occasional cough with some sputum production.  Initial work up found him to be in Afib with RVR and associated acute diastolic heart failure.  Patient was treated with Diltiazem & Eliquis.   He converted to sinus rhythm on 6/4.  Pt noted to have desaturations with ambulation.  In relation to small cell CA, he has been free of new lesions or concerns for CA with surveillance by Dr. Myna Hidalgo. Next scan due in June 2014.  PCCM consulted for evaluation.   Pulmonary Hx:  -Former 60+ years with 1ppd up to 2ppd smoking -Tobacco / Be[employer] -No military hx -s/p chemo / rads 2013    PAST MEDICAL HISTORY :  Past Medical History  Diagnosis Date   . DVT (deep venous thrombosis)     IN RIGHT ARM 11/2010   . BPH (benign prostatic hyperplasia)   . COPD (chronic obstructive pulmonary disease)        . Hyperlipidemia   . Allergic rhinitis   . Allergic conjunctivitis   . DJD (degenerative joint disease)     Left Knee  . Pneumonia     history  . Diverticulosis     hx  . Cellulitis     hx  . Hearing loss   . Lung cancer      history of stage IIIA non-small cell lung cancer who is currently being treated with both  Radiation and Chemotherapy  . Cataract   . Diabetic peripheral neuropathy   . Hypertension   . Obstructive sleep apnea (adult)  10/29/2012    This patient has mild obstructive sleep apnea, tested in March 2014 at [facility] sleep. He had been a CPAP user for 14 years. AHi 10.7 He was titrated to a pressure of 9 cm water( after auto - titration). CPAP at night  --- 8-10 YR AGO....,OSA-diagnosed 2000  . Secondary diabetes with peripheral neuropathy 11/07/2012  . Acute diastolic CHF (congestive heart failure)    Past Surgical History  Procedure Laterality Date  . Insertion of a left subclavian port-a-cath.  12/17/10    [place]  . Portacath placement  04/23/2011    Procedure: INSERTION PORT-A-CATH;  Surgeon: Norton Blizzard, MD;  Location[facility] OR;  Service: Thoracic;;  Revision of  Porta-Cath  . Fiberoptic bronchoscopy  12/11/2010  . Video bronchoscopy  09/27/2011    Procedure: VIDEO BRONCHOSCOPY;  Surgeon: Ines Bloomer, MD;  Location: [facility] OR;  Service: Thoracic;  Laterality: N/A;  . Deviated septum repair    . Cardiac catheterization  2006   Prior to Admission medications   Medication Sig Start Date End Date Taking? Authorizing Provider  aspirin 81 MG tablet Take 81 mg by mouth 2 (two) times daily.   Yes Historical Provider, MD  cetirizine (ZYRTEC) 10 MG tablet Take 10 mg by mouth every morning.    Yes Historical Provider, MD  glimepiride (AMARYL) 2 MG tablet Take 2 mg by mouth daily as needed. Take when blood sugar is >  150   Yes Historical Provider, MD  glucosamine-chondroitin 500-400 MG tablet Take 1 tablet by mouth 2 (two) times daily.    Yes Historical Provider, MD  insulin lispro (HUMALOG) 100 UNIT/ML injection Inject into the skin 3 (three) times daily as needed. Use as directed.  BS 150-200  Take 2units BS 201-250  4 units. BS 251 - 300 6units. BS  301-350 8 units.   Yes Historical Provider, MD  metFORMIN (GLUCOPHAGE) 500 MG tablet Take 500 mg by mouth 2 (two) times daily.   Yes Historical Provider, MD  Psyllium (METAMUCIL PO) Take 2 capsules by mouth daily as needed. For constipation   Yes Historical Provider, MD  simvastatin (ZOCOR) 40 MG tablet Take 40 mg by mouth at bedtime.    Yes Historical Provider, MD  zolpidem (AMBIEN) 10 MG tablet Take 5 mg by mouth at bedtime as needed for sleep. 10/30/11  Yes Josph Macho, MD   No Known Allergies  FAMILY HISTORY:  Family History  Problem Relation Age of Onset  . Coronary artery disease    . Cancer    . Multiple sclerosis    . Diabetes type II    . Anesthesia problems Neg Hx   . Hypotension Neg Hx   . Malignant hyperthermia Neg Hx   . Pseudochol deficiency Neg Hx    SOCIAL HISTORY:  reports that he quit smoking about 20 years ago. His smoking use included Cigarettes. He has a 50 pack-year smoking history. He quit smokeless tobacco use about 2 years ago. He reports that he does not drink alcohol or use illicit drugs.  REVIEW OF SYSTEMS:   See HPI for pertinent positives.  All systems reviewed and otherwise negative.   SUBJECTIVE:   VITAL SIGNS: Temp:  [97.6 F (36.4 C)-98.1 F (36.7 C)] 98.1 F (36.7 C) (06/05 0610) Pulse Rate:  [75-80] 80 (06/05 0610) Resp:  [18-24] 18 (06/05 0610) BP: (109-132)/(64-74) 132/74 mmHg (06/05 0610) SpO2:  [92 %-96 %] 95 % (06/05 0610) Weight:  [224 lb 9.6 oz (101.878 kg)] 224 lb 9.6 oz (101.878 kg) (06/05 0610)  PHYSICAL EXAMINATION: General:  Elderly male in NAD Neuro:  AAOx4, speech clear, MAE HEENT:   Mm pink/moist, no jvd Cardiovascular:  s1s2 rrr, distant tones Lungs:  resp's even/non-labored, lungs bilaterally clear Abdomen:  Round/soft, bsx4 active Musculoskeletal:  No acute deformities Skin:  Warm/dry, no edema   Recent Labs Lab 11/10/12 0539 11/11/12 0500 11/12/12 0519  NA 134* 136 134*  K 3.8 3.8 3.8  CL 94* 97 96  CO2 30 32 31  BUN 23 24* 20  CREATININE 1.32 1.26 1.16  GLUCOSE 196* 170* 165*  Recent Labs Lab 11/07/12 0957  HGB 15.3  HCT 44.3  WBC 6.5  PLT 178   No results found.  ASSESSMENT / PLAN:  Hypoxemia  - reported during ambulation COPD - pending PFT assessment.  Was previously on meds but stopped as he didn't feel any change with meds Atrial Fib + Diastolic CHF (intact LV fxn) OSA - compliant with CPAP, followed by Dr. Vickey Huger Non-Small Cell Lung CA - s/p chemo & rads in 2013.  Followed by Dr. Monika Salk [facility] Scarring - noted on CT's, unchanged R Pleural Effusion - small, noted on previous CT's. Not large enough to tap  79 y/o active M with presumed COPD, OSA and hx of Non-small cell CA admitted with Afib RVR & Diastolic CHF.  Now in sinus rhythm.  Noted desaturations with ambulation according to staff.  Plan: -ambulatory desat assessment >> home O2 if he qualifies -assess PFT's -hold new pulmonary meds until PFT's reviewed -assess CXR -Pulmonary follow up arranged with Dr. Doren Custard, NP-C [facility] Pgr: [phone] or [phone] 11/12/2012, 10:35 AM   Levy Pupa, MD, PhD 11/12/2012, 12:03 PM [facility] [phone] or if no answer [phone]

## 2012-11-12 NOTE — Progress Notes (Signed)
Subjective:  IN sinus all day.  Walked in hall and c/o significant SOB and had mild drop in sats with exercise.  Resting sats OK.  Objective:  Vital Signs in the last 24 hours: BP 132/74  Pulse 80  Temp(Src) 98.1 F (36.7 C) (Oral)  Resp 18  Ht 5\' 10"  (1.778 m)  Wt 101.878 kg (224 lb 9.6 oz)  BMI 32.23 kg/m2  SpO2 95%  Physical Exam: Pleasant WM in NAD Lungs:  Clear  Cardiac: Regular rhythm, normal S1 and S2, no S3 Abdomen:  Soft, nontender, no masses Extremities:  No edema present  Intake/Output from previous day: 06/04 0701 - 06/05 0700 In: 760 [P.O.:760] Out: 1975 [Urine:1975]  Weight Filed Weights   11/11/12 0447 11/12/12 0500 11/12/12 0610  Weight: 101.651 kg (224 lb 1.6 oz) 101.878 kg (224 lb 9.6 oz) 101.878 kg (224 lb 9.6 oz)    Lab Results: Basic Metabolic Panel:  Recent Labs  96/04/54 0500 11/12/12 0519  NA 136 134*  K 3.8 3.8  CL 97 96  CO2 32 31  GLUCOSE 170* 165*  BUN 24* 20  CREATININE 1.26 1.16   Telemetry: Currently normal sinus rhythm.  ECHO:  Normal LV function.  Left atrium not greatly enlarged.  Mild aortic root enlargement.  Assessment/Plan:  1. Atrial fibrillation converted to sinus rhythm 2. Anticoagulation with Eliquus 3. Diabetes 4. Sleep apnea 5. DOE/COPD 6. Acute diastolic heart failure on admission due to afib  Rec:  Currently in sinus rhythm..  Still SOB.  Will recheck BNP and check PFT's.  Discuss with Dr. Felipa Eth and consider pulmonary consult.  Consider d/c later today    W. Ashley Royalty  MD [facility]  11/12/2012, 10:16 AM

## 2012-11-12 NOTE — Discharge Summary (Signed)
Physician Discharge Summary  Patient ID: [name] MRN: [id number] DOB/AGE: 1924-12-16 79 y.o.  Admit date: 11/07/2012 Discharge date: 11/12/2012  Primary Physician:  Dr. Larina Earthly  Primary Discharge Diagnosis: 1. Atrial fibrillation with rapid ventricular response  Secondary Discharge Diagnosis: 2. Acute diastolic congestive heart failure due to rapid atrial fibrillation 3. Obstructive sleep apnea 4. History of stage IIIB lung cancer treated with radiation chemotherapy 5. Secondary diabetes with peripheral neuropathy 6. Hyperlipidemia 7. COPD 8. Hypertension 9. Chronic dyspnea  Procedures:  2-D echocardiogram, pulmonary function tests  Consults:  Dr. Larina Earthly Course: 79 year old male has a prior history of COPD, obstructive sleep apnea, hypertension, diabetes with neuropathy, and a prior history of lung cancer. He was treated with chemotherapy and radiation therapy which stopped in January 2013. He has a previous history of a deep venous thrombosis complicating his chemotherapy. He states that he had transient atrial fibrillation during his chemotherapy that resolved. He saw Dr. Deborah Chalk several years ago and states he had a catheterization for unclear reasons and was told that he had had 2 heart attacks in the past at some point. He has never had anginal pain. He has some mild chronic dyspnea but about 2 weeks prior to admission had an episode of dizziness, felt irregular heartbeat and became very lightheaded while in his car.  His had significant dyspnea that worsened over the past couple of days and came to the emergency room this morning where he was found to be in rapid atrial fibrillation. He was given a single injection of diltiazem and was admitted to the hospital for further evaluation  He was placed on telemetry  when he came into the hospital and started on a diltiazem drip. He converted to sinus rhythm overnight and was feeling better the next morning. The  diltiazem drip was weaned and he was transferred to the floor after having been placed on oral therapy. He went back into atrial fibrillation and was more symptomatic from this and was placed on amiodarone 400 mg twice daily. He again converted to sinus rhythm on the evening of June 3. He continued to complain of some shortness of breath and required oxygen. He was angulatory and did have some mild drop in his saturations with ambulation. He was given intravenous Lasix because of an elevated BNP on admission as he was felt to have acute diastolic heart failure due 2 atrial fibrillation. An echocardiogram showed mild aortic stenosis, an ejection fraction of 5055%, a mildly dilated right ventricle and a mildly dilated aorta.  Because of continued dyspnea with exertion despite the maintenance of sinus rhythm, he was seen in consultation by Dr. Levy Pupa who recommended holding pulmonary medicines until his pulmonary function tests were reviewed. A chest x-ray was assessed and pulmonary followup was arranged with Dr. Delton Coombes. Pulmonary function tests were done prior to discharge but the results are pending at the time of discharge. Because of his symptomatic atrial fibrillation, he will need to continue on amiodarone and requests as well as diltiazem for rate control. He was in sinus rhythm on discharge. He was ambulatory in the hall and had desaturations to 87% on room air and arrangements were made for oxygen to be used with ambulation.   Discharge Exam: Blood pressure 132/74, pulse 80, temperature 98.1 F (36.7 C), temperature source Oral, resp. rate 18, height 5\' 10"  (1.778 m), weight 101.878 kg (224 lb 9.6 oz), SpO2 95.00%.   Reduced breath sounds bases, cardiac rhythm regular, no S3  Labs: CBC:   Lab Results  Component Value Date   WBC 6.5 11/07/2012   HGB 15.3 11/07/2012   HCT 44.3 11/07/2012   MCV 88.2 11/07/2012   PLT 178 11/07/2012   CMP:  Recent Labs Lab 11/07/12 1312  11/12/12 0519  NA  137  < > 134*  K 4.1  < > 3.8  CL 97  < > 96  CO2 30  < > 31  BUN 15  < > 20  CREATININE 1.22  < > 1.16  CALCIUM 9.3  < > 8.7  PROT 7.2  --   --   BILITOT 0.4  --   --   ALKPHOS 103  --   --   ALT 11  --   --   AST 16  --   --   GLUCOSE 218*  < > 165*  < > = values in this interval not displayed. Lipid Panel     Component Value Date/Time   CHOL 117 11/08/2012 0514   TRIG 121 11/08/2012 0514   HDL 45 11/08/2012 0514   CHOLHDL 2.6 11/08/2012 0514   VLDL 24 11/08/2012 0514   LDLCALC 48 11/08/2012 0514   BNP    Component Value Date/Time   PROBNP 2007.0* 11/07/2012 1610    Radiology: Hyperinflation, relatively clear lung fields  EKG: Atrial fibrillation with rapid ventricular response, followup EKG shows sinus rhythm  Discharge Medications:   Medication List    STOP taking these medications       aspirin 81 MG tablet     simvastatin 40 MG tablet  Commonly known as:  ZOCOR      TAKE these medications       amiodarone 200 MG tablet  Commonly known as:  PACERONE  Take 1 tablet (200 mg total) by mouth 2 (two) times daily.     apixaban 5 MG Tabs tablet  Commonly known as:  ELIQUIS  Take 1 tablet (5 mg total) by mouth 2 (two) times daily.     atorvastatin 20 MG tablet  Commonly known as:  LIPITOR  Take 1 tablet (20 mg total) by mouth daily.     cetirizine 10 MG tablet  Commonly known as:  ZYRTEC  Take 10 mg by mouth every morning.     diltiazem 240 MG 24 hr capsule  Commonly known as:  CARDIZEM CD  Take 1 capsule (240 mg total) by mouth daily.     furosemide 40 MG tablet  Commonly known as:  LASIX  Take 1 tablet (40 mg total) by mouth daily.     glimepiride 2 MG tablet  Commonly known as:  AMARYL  Take 2 mg by mouth daily as needed. Take when blood sugar is > 150     glucosamine-chondroitin 500-400 MG tablet  Take 1 tablet by mouth 2 (two) times daily.     insulin lispro 100 UNIT/ML injection  Commonly known as:  HUMALOG  Inject into the skin 3 (three) times  daily as needed. Use as directed.  BS 150-200  Take 2units  BS 201-250  4 units.  BS 251 - 300 6units.  BS  301-350 8 units.     METAMUCIL PO  Take 2 capsules by mouth daily as needed. For constipation     metFORMIN 500 MG tablet  Commonly known as:  GLUCOPHAGE  Take 500 mg by mouth 2 (two) times daily.     zolpidem 10 MG tablet  Commonly known as:  AMBIEN  Take  5 mg by mouth at bedtime as needed for sleep.       Followup plans and appointments: Followup with Dr. Felipa Eth as usual for your appointment. Followup with Dr. Delton Coombes for pulmonary followup. Followup with Dr. Donnie Aho in one week  Time spent with patient to include physician time: 45 minutes   Signed: W. Ashley Royalty. MD [facility] 11/12/2012, 12:50 PM

## 2012-11-13 NOTE — Progress Notes (Signed)
11/13/12 0850 Noted CM referral for home continuous oxygen.  This NCM called pt. at home [phone]) to inquire of pt. obtaining oxygen prior to dc.  Pt. stated he was doing well and he obtained an oxygen tank from [facility] prior to discharge, and [facility] brought the rest of his oxygen to his home last night.  Pt. without c/o of care he received while a pt. of [place]. Tera Mater, RN, BSN NCM [phone]

## 2012-11-25 ENCOUNTER — Encounter (HOSPITAL_BASED_OUTPATIENT_CLINIC_OR_DEPARTMENT_OTHER): Payer: Self-pay

## 2012-11-25 ENCOUNTER — Ambulatory Visit: Payer: Medicare Other

## 2012-11-25 ENCOUNTER — Other Ambulatory Visit (HOSPITAL_BASED_OUTPATIENT_CLINIC_OR_DEPARTMENT_OTHER): Payer: Medicare Other | Admitting: Lab

## 2012-11-25 ENCOUNTER — Ambulatory Visit (HOSPITAL_BASED_OUTPATIENT_CLINIC_OR_DEPARTMENT_OTHER)
Admission: RE | Admit: 2012-11-25 | Discharge: 2012-11-25 | Disposition: A | Discharge status: Home / Self Care | Payer: Medicare Other | Source: Ambulatory Visit | Attending: Hematology & Oncology | Admitting: Hematology & Oncology

## 2012-11-25 DIAGNOSIS — C3490 Malignant neoplasm of unspecified part of unspecified bronchus or lung: Secondary | ICD-10-CM | POA: Insufficient documentation

## 2012-11-25 DIAGNOSIS — I82409 Acute embolism and thrombosis of unspecified deep veins of unspecified lower extremity: Secondary | ICD-10-CM

## 2012-11-25 DIAGNOSIS — I82401 Acute embolism and thrombosis of unspecified deep veins of right lower extremity: Secondary | ICD-10-CM

## 2012-11-25 DIAGNOSIS — K8020 Calculus of gallbladder without cholecystitis without obstruction: Secondary | ICD-10-CM | POA: Insufficient documentation

## 2012-11-25 LAB — CBC WITH DIFFERENTIAL (CANCER CENTER ONLY)
BASO%: 0.1 % (ref 0.0–2.0)
EOS%: 4.2 % (ref 0.0–7.0)
LYMPH#: 0.9 10*3/uL (ref 0.9–3.3)
MCH: 30.6 pg (ref 28.0–33.4)
MCHC: 34.1 g/dL (ref 32.0–35.9)
MONO%: 9.5 % (ref 0.0–13.0)
NEUT#: 5.4 10*3/uL (ref 1.5–6.5)
NEUT%: 73.8 % (ref 40.0–80.0)
RDW: 12.7 % (ref 11.1–15.7)

## 2012-11-25 LAB — CMP (CANCER CENTER ONLY)
AST: 22 U/L (ref 11–38)
Albumin: 3.4 g/dL (ref 3.3–5.5)
Alkaline Phosphatase: 75 U/L (ref 26–84)
BUN, Bld: 20 mg/dL (ref 7–22)
Potassium: 4 mEq/L (ref 3.3–4.7)
Sodium: 134 mEq/L (ref 128–145)
Total Bilirubin: 0.7 mg/dl (ref 0.20–1.60)

## 2012-11-25 MED ORDER — IOHEXOL 300 MG/ML  SOLN
80.0000 mL | Freq: Once | INTRAMUSCULAR | Status: AC | PRN
  Administered 2012-11-25: 80 mL via INTRAVENOUS

## 2012-11-25 MED ORDER — SODIUM CHLORIDE 0.9 % IJ SOLN
10.0000 mL | INTRAMUSCULAR | Status: DC | PRN
  Administered 2012-11-25: 10 mL via INTRAVENOUS
  Filled 2012-11-25: qty 10

## 2012-11-25 MED ORDER — HEPARIN SOD (PORK) LOCK FLUSH 100 UNIT/ML IV SOLN
500.0000 [IU] | Freq: Once | INTRAVENOUS | Status: AC
  Administered 2012-11-25: 500 [IU] via INTRAVENOUS
  Filled 2012-11-25: qty 5

## 2012-11-25 NOTE — Progress Notes (Signed)
[name] presented for Portacath access and flush. Proper placement of portacath confirmed by CXR. Portacath located in the right chest wall accessed with  H 20 needle. Clean, Dry and Intact Good blood return present. Portacath flushed with 20ml NS and 500U/10ml Heparin per protocol and needle removed intact. Procedure without incident. Patient tolerated procedure well.

## 2012-12-02 ENCOUNTER — Ambulatory Visit (HOSPITAL_BASED_OUTPATIENT_CLINIC_OR_DEPARTMENT_OTHER): Payer: Medicare Other | Admitting: Hematology & Oncology

## 2012-12-02 DIAGNOSIS — C3490 Malignant neoplasm of unspecified part of unspecified bronchus or lung: Secondary | ICD-10-CM

## 2012-12-02 NOTE — Progress Notes (Signed)
This office note has been dictated.

## 2012-12-03 NOTE — Progress Notes (Signed)
CC:   Larina Earthly, M.D. Georga Hacking, M.D.  DIAGNOSIS: 1. 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 2. Atrial fibrillation, the patient now on Eliquis. 3. 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  CURRENT THERAPY: 1. Eliquis 5 mg p.o. b.i.d. 2. Amiodarone 200 mg p.o. b.i.d.  INTERIM HISTORY:  [name] comes in for followup.  Unfortunately, his problem now is his heart.  He was recently hospitalized.  He had atrial fibrillation.  He was in the ICU.  He subsequently was placed on amiodarone.  He is also on Eliquis now for thrombotic prevention.  His aspirin was discontinued.  We did go ahead and do a CT scan of his chest.  This was done on 06/16. The CT scan of the chest did not show any evidence of recurrent cancer. He had a moderate right pleural effusion.  He had a stable right paratracheal lymph node measuring 1.2 x 1 cm.  Otherwise, there is no evidence of recurrent lung cancer. He is having a lot of issues with the family.  This I really feel bad about.  He is on oxygen right now.  Dr. Felipa Eth is his family doctor.  I think he sees a pulmonologist in a week or so.  Dr. Donnie Aho is his cardiologist who is really doing a great job in managing his atrial fibrillation.  PHYSICAL EXAMINATION:  General:  This is an elderly appearing white gentleman in no obvious distress.  Vital signs:  Temperature of 97.4, pulse 72, respiratory rate 18, blood pressure 113/71.  Weight is 223. Head and neck:  Normocephalic, atraumatic skull.  There are no ocular or oral lesions.  There are no palpable cervical or supraclavicular lymph nodes.  Lungs:  With decreased breath sounds over on the right side.  He has some crackles bilaterally.  Occasional wheezes noted.  Cardiac: Regular rate and rhythm with an occasional extra beat.  He has no murmurs, rubs or bruits.  Abdomen:  Soft with good bowel sounds.  There is no fluid wave.  There is no palpable abdominal  mass.  There is no palpable hepatosplenomegaly.  Extremities:  Show no clubbing, cyanosis or edema.  LABORATORY STUDIES:  White cell count is 7.3, hemoglobin 15.3, hematocrit 44.9, platelet count 206.  Glucose is 258.  BUN 20, creatinine 1.3.  IMPRESSION:  [name] is very nice, 79 year old gentleman with a past history of stage IIIB adenocarcinoma of the right lung.  He received chemo and radiation therapy.  He got into remission.  He completed his treatments back in January of 2013.  He did have a subclavian DVT at that time.  He was on Arixtra for 1 year.  We then converted him over to aspirin.  Again, his atrial ablation is his biggest problem.  His lungs are also a big problem for the standpoint of I think COPD.  We do not need to do another scan on him probably until December.  I want to see him back in 3 months.  We will have his Port-A-Cath flushed every 6 weeks.    ______________________________ Josph Macho, M.D. PRE/MEDQ  D:  12/02/2012  T:  12/03/2012  Job:  [id number]

## 2012-12-04 ENCOUNTER — Encounter: Payer: Self-pay | Admitting: Emergency Medicine

## 2012-12-04 ENCOUNTER — Ambulatory Visit (INDEPENDENT_AMBULATORY_CARE_PROVIDER_SITE_OTHER): Payer: Medicare Other | Admitting: Emergency Medicine

## 2012-12-04 VITALS — BP 102/58 | HR 67 | Temp 98.0°F | Ht 70.0 in | Wt 223.4 lb

## 2012-12-04 DIAGNOSIS — C3490 Malignant neoplasm of unspecified part of unspecified bronchus or lung: Secondary | ICD-10-CM

## 2012-12-04 DIAGNOSIS — J449 Chronic obstructive pulmonary disease, unspecified: Secondary | ICD-10-CM

## 2012-12-04 DIAGNOSIS — J4489 Other specified chronic obstructive pulmonary disease: Secondary | ICD-10-CM

## 2012-12-04 DIAGNOSIS — G4733 Obstructive sleep apnea (adult) (pediatric): Secondary | ICD-10-CM

## 2012-12-04 NOTE — Assessment & Plan Note (Signed)
On CPAP. ?

## 2012-12-04 NOTE — Patient Instructions (Addendum)
Please continue your oxygen as you are using it. We will try to get a more portable system Please start Anoro once a day  Follow with Dr Delton Coombes in 4-6 weeks to assess your breathing on the new medicine.

## 2012-12-04 NOTE — Assessment & Plan Note (Signed)
Stable by CT this month

## 2012-12-04 NOTE — Assessment & Plan Note (Signed)
Severe AFL by spirometry. Hypoxemia noted during hospitalization - start trial Anoro qd to see if he benefits - rov 4-6 weeks

## 2012-12-04 NOTE — Progress Notes (Signed)
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 Was admitted 5/31 with acute dyspnea, found to be in A fib + RVR. Started on amiodarone. Converted to NSR 11/11/12. Scheduled for repeat Ct scan chest (Dr Monika Salk) for surveillance on 11/25/12. During his hospitalization, we started the eval for his presumed COPD.  He has been on Spiriva before, SABA before, didn't notice any benefit.    Past Medical History  Diagnosis Date  . DVT (deep venous thrombosis)     IN RIGHT ARM 11/2010   . BPH (benign prostatic hyperplasia)   . COPD (chronic obstructive pulmonary disease)        . Hyperlipidemia   . Allergic rhinitis   . Allergic conjunctivitis   . DJD (degenerative joint disease)     Left Knee  . Pneumonia     history  . Diverticulosis     hx  . Cellulitis     hx  . Hearing loss   . Lung cancer      history of stage IIIA non-small cell lung cancer who is currently being treated with both  Radiation and Chemotherapy  . Cataract   . Diabetic peripheral neuropathy   . Hypertension   . Obstructive sleep apnea (adult)  10/29/2012    This patient has mild obstructive sleep apnea, tested in March 2014 at [facility] sleep. He had been a CPAP user for 14 years. AHi 10.7 He was titrated to a pressure of 9 cm water( after auto - titration). CPAP at night  --- 8-10 YR AGO....,OSA-diagnosed 2000  . Secondary diabetes with peripheral neuropathy 11/07/2012  . Acute diastolic CHF (congestive heart failure)      Family History  Problem Relation Age of Onset  . Coronary artery disease    . Cancer    . Multiple sclerosis    . Diabetes type II    . Anesthesia problems Neg Hx   . Hypotension Neg Hx   . Malignant hyperthermia Neg Hx   . Pseudochol deficiency Neg Hx      History   Social History  . Marital Status: Married    Spouse Name: N/A    Number of Children: 2  . Years of Education: N/A    Occupational History  . farmer    Social History Main Topics  . Smoking status: Former Smoker -- 1.00 packs/day for 50 years    Types: Cigarettes    Quit date: 06/10/1992  . Smokeless tobacco: Former Neurosurgeon    Quit date: 08/01/2010  . Alcohol Use: No  . Drug Use: No  . Sexually Active: Not on file   Other Topics Concern  . Not on file   Social History Narrative  . No narrative on file     No Known Allergies   Outpatient Prescriptions Prior to Visit  Medication Sig Dispense Refill  . amiodarone (PACERONE) 200 MG tablet Take 1 tablet (200 mg total) by mouth 2 (two) times daily.  60 tablet  1  . apixaban (ELIQUIS) 5 MG TABS tablet Take 1 tablet (5 mg total) by mouth 2 (two) times daily.  60 tablet  12  . atorvastatin (LIPITOR) 20 MG tablet Take 1 tablet (20 mg total) by mouth daily.  30 tablet  12  . cetirizine (ZYRTEC) 10 MG tablet Take 10 mg by mouth every morning.       . diltiazem (CARDIZEM CD) 240  MG 24 hr capsule Take 1 capsule (240 mg total) by mouth daily.  30 capsule  12  . furosemide (LASIX) 40 MG tablet Take 1 tablet (40 mg total) by mouth daily.  30 tablet  12  . glimepiride (AMARYL) 2 MG tablet Take 2 mg by mouth daily as needed. Take when blood sugar is > 150      . glucosamine-chondroitin 500-400 MG tablet Take 1 tablet by mouth 2 (two) times daily.       . insulin detemir (LEVEMIR) 100 UNIT/ML injection Inject into the skin 2 (two) times daily. 3 U. IN AM .AND 12 U. IN PM      . insulin lispro (HUMALOG) 100 UNIT/ML injection Inject into the skin 3 (three) times daily as needed. Use as directed.  BS 150-200  Take 2units BS 201-250  4 units. BS 251 - 300 6units. BS  301-350 8 units.      . metFORMIN (GLUCOPHAGE) 500 MG tablet Take 500 mg by mouth 2 (two) times daily.      . Psyllium (METAMUCIL PO) Take 2 capsules by mouth daily as needed. For constipation      . zolpidem (AMBIEN) 10 MG tablet Take 5 mg by mouth at bedtime as needed for sleep.        Facility-Administered Medications Prior to Visit  Medication Dose Route Frequency Provider Last Rate Last Dose  . sodium chloride 0.9 % injection 10 mL  10 mL Intravenous PRN Josph Macho, MD   10 mL at 09/11/12 0906    Filed Vitals:   12/04/12 1523  BP: 102/58  Pulse: 67  Temp: 98 F (36.7 C)  TempSrc: Oral  Height: 5\' 10"  (1.778 m)  Weight: 223 lb 6.4 oz (101.334 kg)  SpO2: 94%   Gen: Pleasant, well-nourished, in no distress,  normal affect  ENT: No lesions,  mouth clear,  oropharynx clear, no postnasal drip  Neck: No JVD, no TMG, no carotid bruits  Lungs: No use of accessory muscles, no dullness to percussion, clear without rales or rhonchi  Cardiovascular: RRR, heart sounds normal, no murmur or gallops, no peripheral edema  Musculoskeletal: No deformities, no cyanosis or clubbing  Neuro: alert, non focal  Skin: Warm, no lesions or rashes     CT chest 11/25/12 --  Comparison: 07/28/2012  Findings: There is a moderate right pleural effusion. This is  similar in volume to the previous exam. Very small left effusion  is also stable from previous examination. Moderate changes of  centrilobular and paraseptal emphysema identified. No airspace  consolidation identified.  Previously described right paratracheal lesion measures 1.2 x 1.0  cm, image 17/series 2. This is not significantly changed from  previous exam. No new or enlarging mediastinal or hilar lymph  nodes identified.  Dilatation of the transverse aortic arch measures 3.8 cm, image  15/series 2. This is stable from previous exam. Heart size is  normal. No pericardial effusion. Prominent calcifications  involving the LAD, left circumflex coronary arteries noted.  Limited imaging through the upper abdomen shows gallstones. The  adrenal glands both appear within normal limits. No acute findings  identified.  Review of the visualized bony structures is remarkable for  multilevel degenerative disc disease  within the thoracic spine. No  aggressive lytic or sclerotic bone lesions identified.  IMPRESSION:  1. Stable appearance of right paratracheal lesion.  2. No change in pleural effusions right greater than left.  3. Gallstones.  4. Coronary artery calcifications.  COPD (chronic  obstructive pulmonary disease) Severe AFL by spirometry. Hypoxemia noted during hospitalization - start trial Anoro qd to see if he benefits - rov 4-6 weeks  Lung cancer Stable by CT this month  Obstructive sleep apnea (adult)  On CPAP

## 2012-12-10 ENCOUNTER — Encounter: Payer: Self-pay | Admitting: Neurology

## 2012-12-16 ENCOUNTER — Telehealth: Payer: Self-pay | Admitting: Emergency Medicine

## 2012-12-16 DIAGNOSIS — J449 Chronic obstructive pulmonary disease, unspecified: Secondary | ICD-10-CM

## 2012-12-16 NOTE — Telephone Encounter (Signed)
LMTC x 1  

## 2012-12-17 ENCOUNTER — Institutional Professional Consult (permissible substitution): Payer: Medicare Other | Admitting: Neurology

## 2012-12-17 NOTE — Telephone Encounter (Signed)
ATC Lupita Leash at # provided-- A man answered the phone stated she was sleep, took our office # and said he would have her call us back Will await return call

## 2012-12-18 NOTE — Telephone Encounter (Signed)
Thanks

## 2012-12-18 NOTE — Telephone Encounter (Signed)
Called and spoke with pts daughter and she is aware of increase in oxygen for the pt to 3 liters with exertion and 2 liters at rest.  She stated that they may go tomorrow and check out the smart go at [employer].  They will let RB know if they decide to change to this oxygen system.  Nothing further is needed.

## 2012-12-18 NOTE — Telephone Encounter (Signed)
Spoke with Lupita Leash- pts daughter Patient went and looked at a lighter more portable o2 system from [facility] Per Lupita Leash the tank was smaller however it only pulsated and when ambulating patient found it to be about the same weight as his prior one Also his o2 sats would drop on the new system w pulseed o2, they have a cont tank as well but it only last 1-1.5 hrs Lupita Leash stated patient will look into another system [facility] has and will call back for an order if they decide to go with their option.  Also Lupita Leash reports patient has been having a bit more diff breathing while ambulating, says sometimes patients sats drop [facility] came by their home and tested patient on 3L and patient seemed to do better Lupita Leash is requesting that patient be able to increase o2 to 3L w exertion Dr. Delton Coombes please advise, thank you!

## 2012-12-18 NOTE — Telephone Encounter (Signed)
Yes please increase her to 3L/min with all exertion, 2L/min at rest

## 2012-12-22 ENCOUNTER — Institutional Professional Consult (permissible substitution): Payer: Medicare Other | Admitting: Neurology

## 2013-01-01 ENCOUNTER — Ambulatory Visit (INDEPENDENT_AMBULATORY_CARE_PROVIDER_SITE_OTHER): Payer: Medicare Other | Admitting: Neurology

## 2013-01-01 ENCOUNTER — Encounter: Payer: Self-pay | Admitting: Neurology

## 2013-01-01 VITALS — BP 115/72 | HR 62 | Resp 18 | Ht 70.0 in | Wt 228.0 lb

## 2013-01-01 DIAGNOSIS — G4733 Obstructive sleep apnea (adult) (pediatric): Secondary | ICD-10-CM

## 2013-01-01 NOTE — Progress Notes (Signed)
   [name] is here today for a followup visit after his recent re\re titration to a new CPAP pressure. As described in my visit note from 10-29-12 the patient had used originally 10 and 11 cm water pressure is but felt more longer the same his sleep was longer restorative and he had also other medical comorbidities that may affect his breathing. [name] had last lost 65 pounds after he was diagnosed with small cell lung cancer, he is retired used to work as a Visual merchandiser lives a lot of physical activity, he also has been a smoker until 2012 and stopped after being diagnosed with COPD.  The patient used CPAP auto titration through May 2014,  revealing the 95th percentile pressure at 9 cm water . His download from 12-05-12  documented  93% compliance, the residual the AHI of 2.0 and a use at time of 7 hours and 12 minutes nightly.  The results are excellent his fatigue score remains high at 61 points this sleepiness score however is reduced to 7 points. I attribute the fatigue still to his other medical problems and he is now on oxygen continuously but at night is attached to her CPAP machine.  His fall risk assessment is 10 points  The patient's bedtime is around 10 to 10:30 PM, he normally has one bathroom break at 4 AM, sometimes he has trouble reinitiating sleep after that but he finally will rise and the morning at about 6 AM. His overall nocturnal sleep time is between 6 and 7 hours and this is reflected in his CPAP user time.  No adjustments are necessary.   Med list unchanged , except for Amiodarone once instead of twice daily . He was seen at [facility]  late May , admitted through the ED, shortness of breath, CHF  from atrial fibrillation.   Patient is awake, alert and appears not in acute distress he is well groomed normocephalic but traumatic. Mallampati 2, elongated uvula but left above tongue ground , neck circumference is 18 inches. This off he is able to breathe through his nose and with  an oxygen cannula. Mood and affect are appropriate. Fall risk assessment 10 pints Worsened fatigue score is a mentioned above.   Extraocular movements intact, the patient has decreased hearing, he has slightly decreased grip strength bilaterally there is no favoring left or right or vice versa.  Facial motor strength is symmetric and there is no numbness noted.  Fine touch and vibration were felt down to the ankle with old decrease.  Coordination finger-to-nose maneuver shows mild dysmetria but no tremor.   Assessment patient with hypoventilation, SOB  and mild apnea in need of continuous oxygen supplementation.  COPD and lung cancer history.   CPAP download shows his apnea index to be reduced to 12.0, which is a very satisfying result he is 93% compliant at 7 hours and 12 minutes average use. No change in her settings as necessary.

## 2013-01-01 NOTE — Patient Instructions (Signed)
Sleep Apnea  Sleep apnea is disorder that affects a person's sleep. A person with sleep apnea has abnormal pauses in their breathing when they sleep. It is hard for them to get a good sleep. This makes a person tired during the day. It also can lead to other physical problems. There are three types of sleep apnea. One type is when breathing stops for a short time because your airway is blocked (obstructive sleep apnea). Another type is when the brain sometimes fails to give the normal signal to breathe to the muscles that control your breathing (central sleep apnea). The third type is a combination of the other two types.  HOME CARE  · Do not sleep on your back. Try to sleep on your side.  · Take all medicine as told by your doctor.  · Avoid alcohol, calming medicines (sedatives), and depressant drugs.  · Try to lose weight if you are overweight. Talk to your doctor about a healthy weight goal.  Your doctor may have you use a device that helps to open your airway. It can help you get the air that you need. It is called a positive airway pressure (PAP) device. There are three types of PAP devices:  · Continuous positive airway pressure (CPAP) device.  · Nasal expiratory positive airway pressure (EPAP) device.  · Bilevel positive airway pressure (BPAP) device.  MAKE SURE YOU:  · Understand these instructions.  · Will watch your condition.  · Will get help right away if you are not doing well or get worse.  Document Released: 03/05/2008 Document Revised: 05/13/2012 Document Reviewed: 09/28/2011  ExitCare® Patient Information ©2014 [employer].

## 2013-01-01 NOTE — Assessment & Plan Note (Signed)
The patient uses CPAP at 9 cm water for 93% compliance over the last 30 days and his residual AHI is 2.0.

## 2013-01-06 ENCOUNTER — Ambulatory Visit: Payer: Medicare Other

## 2013-01-08 ENCOUNTER — Ambulatory Visit: Payer: Medicare Other | Admitting: Emergency Medicine

## 2013-02-03 ENCOUNTER — Encounter: Payer: Self-pay | Admitting: Emergency Medicine

## 2013-02-03 ENCOUNTER — Ambulatory Visit (INDEPENDENT_AMBULATORY_CARE_PROVIDER_SITE_OTHER): Payer: Medicare Other | Admitting: Emergency Medicine

## 2013-02-03 VITALS — BP 104/60 | HR 60 | Temp 98.2°F | Ht 70.0 in | Wt 232.2 lb

## 2013-02-03 DIAGNOSIS — G4733 Obstructive sleep apnea (adult) (pediatric): Secondary | ICD-10-CM

## 2013-02-03 DIAGNOSIS — J449 Chronic obstructive pulmonary disease, unspecified: Secondary | ICD-10-CM

## 2013-02-03 DIAGNOSIS — C3490 Malignant neoplasm of unspecified part of unspecified bronchus or lung: Secondary | ICD-10-CM

## 2013-02-03 NOTE — Progress Notes (Signed)
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 Was admitted 5/31 with acute dyspnea, found to be in A fib + RVR. Started on amiodarone. Converted to NSR 11/11/12. Scheduled for repeat Ct scan chest (Dr Monika Salk) for surveillance on 11/25/12. During his hospitalization, we started the eval for his presumed COPD.  He has been on Spiriva before, SABA before, didn't notice any benefit.   ROV 02/03/13 -- Hx COPD, OSA on CPAP, adenoCA. Last time we tried anoro to see if she would benefit. He isn't sure that the medication is doing anything. He does feel stronger and has little cough or wheeze. He is wearing o2 on 2L/min at all times. Wears CPAP reliably.    Filed Vitals:   02/03/13 1625  BP: 104/60  Pulse: 60  Temp: 98.2 F (36.8 C)  TempSrc: Oral  Height: 5\' 10"  (1.778 m)  Weight: 105.325 kg (232 lb 3.2 oz)  SpO2: 95%   Gen: Pleasant, well-nourished, in no distress,  normal affect  ENT: No lesions,  mouth clear,  oropharynx clear, no postnasal drip  Neck: No JVD, no TMG, no carotid bruits  Lungs: No use of accessory muscles, no dullness to percussion, clear without rales or rhonchi  Cardiovascular: RRR, heart sounds normal, no murmur or gallops, no peripheral edema  Musculoskeletal: No deformities, no cyanosis or clubbing  Neuro: alert, non focal  Skin: Warm, no lesions or rashes     CT chest 11/25/12 --  Comparison: 07/28/2012  Findings: There is a moderate right pleural effusion. This is  similar in volume to the previous exam. Very small left effusion  is also stable from previous examination. Moderate changes of  centrilobular and paraseptal emphysema identified. No airspace  consolidation identified.  Previously described right paratracheal lesion measures 1.2 x 1.0  cm, image 17/series 2. This is not significantly changed from  previous exam. No new or enlarging mediastinal or  hilar lymph  nodes identified.  Dilatation of the transverse aortic arch measures 3.8 cm, image  15/series 2. This is stable from previous exam. Heart size is  normal. No pericardial effusion. Prominent calcifications  involving the LAD, left circumflex coronary arteries noted.  Limited imaging through the upper abdomen shows gallstones. The  adrenal glands both appear within normal limits. No acute findings  identified.  Review of the visualized bony structures is remarkable for  multilevel degenerative disc disease within the thoracic spine. No  aggressive lytic or sclerotic bone lesions identified.  IMPRESSION:  1. Stable appearance of right paratracheal lesion.  2. No change in pleural effusions right greater than left.  3. Gallstones.  4. Coronary artery calcifications.  Obstructive sleep apnea (adult)  CPAP as orderd  COPD (chronic obstructive pulmonary disease) Will d/c anoro and follow clinically Continue O2 and change to [facility] so he can get portable concentrator  Lung cancer Stable by CT 11/2012

## 2013-02-03 NOTE — Assessment & Plan Note (Signed)
Will d/c anoro and follow clinically Continue O2 and change to [facility] so he can get portable concentrator

## 2013-02-03 NOTE — Assessment & Plan Note (Signed)
CPAP as orderd

## 2013-02-03 NOTE — Patient Instructions (Addendum)
Please continue your oxygen at all times and your CPAP at night  We will work on getting a portable oxygen concentrator Stop Anoro for now. Pay attention to your symptoms to see if you miss it Follow with Dr Delton Coombes in 4 months or sooner if you have any problems.

## 2013-02-09 ENCOUNTER — Telehealth: Payer: Self-pay | Admitting: Emergency Medicine

## 2013-02-09 DIAGNOSIS — J449 Chronic obstructive pulmonary disease, unspecified: Secondary | ICD-10-CM

## 2013-02-09 NOTE — Telephone Encounter (Signed)
Order has been placed and staff message sent to [facility]

## 2013-02-09 NOTE — Telephone Encounter (Signed)
Pt is changing DME companies from [facility] to [employer]. [facility] will be bringing their equipment tomorrow. Need D/C order for [facility] to pick up their oxygen equipment before 02/11/13. Please send order to [facility] to D/C o2 equipment and tanks by [facility]. Thanks, Ollen Gross

## 2013-02-17 ENCOUNTER — Ambulatory Visit (HOSPITAL_BASED_OUTPATIENT_CLINIC_OR_DEPARTMENT_OTHER): Payer: Medicare Other

## 2013-02-17 ENCOUNTER — Other Ambulatory Visit (HOSPITAL_BASED_OUTPATIENT_CLINIC_OR_DEPARTMENT_OTHER): Payer: Medicare Other | Admitting: Lab

## 2013-02-17 ENCOUNTER — Ambulatory Visit (HOSPITAL_BASED_OUTPATIENT_CLINIC_OR_DEPARTMENT_OTHER): Payer: Medicare Other | Admitting: Hematology & Oncology

## 2013-02-17 DIAGNOSIS — C3490 Malignant neoplasm of unspecified part of unspecified bronchus or lung: Secondary | ICD-10-CM

## 2013-02-17 DIAGNOSIS — Z86718 Personal history of other venous thrombosis and embolism: Secondary | ICD-10-CM

## 2013-02-17 DIAGNOSIS — I4891 Unspecified atrial fibrillation: Secondary | ICD-10-CM

## 2013-02-17 DIAGNOSIS — Z85118 Personal history of other malignant neoplasm of bronchus and lung: Secondary | ICD-10-CM

## 2013-02-17 LAB — CBC WITH DIFFERENTIAL (CANCER CENTER ONLY)
BASO#: 0 10*3/uL (ref 0.0–0.2)
EOS%: 1 % (ref 0.0–7.0)
Eosinophils Absolute: 0.1 10*3/uL (ref 0.0–0.5)
HCT: 43.9 % (ref 38.7–49.9)
HGB: 14.9 g/dL (ref 13.0–17.1)
LYMPH%: 16.6 % (ref 14.0–48.0)
MCH: 31.2 pg (ref 28.0–33.4)
MCHC: 33.9 g/dL (ref 32.0–35.9)
MCV: 92 fL (ref 82–98)
MONO%: 8.8 % (ref 0.0–13.0)
NEUT#: 6.3 10*3/uL (ref 1.5–6.5)
NEUT%: 73.3 % (ref 40.0–80.0)

## 2013-02-17 LAB — PREALBUMIN: Prealbumin: 20.8 mg/dL (ref 17.0–34.0)

## 2013-02-17 LAB — COMPREHENSIVE METABOLIC PANEL
AST: 19 U/L (ref 0–37)
Alkaline Phosphatase: 79 U/L (ref 39–117)
BUN: 25 mg/dL — ABNORMAL HIGH (ref 6–23)
Creatinine, Ser: 1.48 mg/dL — ABNORMAL HIGH (ref 0.50–1.35)
Glucose, Bld: 215 mg/dL — ABNORMAL HIGH (ref 70–99)
Total Bilirubin: 0.6 mg/dL (ref 0.3–1.2)

## 2013-02-17 MED ORDER — HEPARIN SOD (PORK) LOCK FLUSH 100 UNIT/ML IV SOLN
500.0000 [IU] | Freq: Once | INTRAVENOUS | Status: AC
  Administered 2013-02-17: 500 [IU] via INTRAVENOUS
  Filled 2013-02-17: qty 5

## 2013-02-17 MED ORDER — SODIUM CHLORIDE 0.9 % IJ SOLN
10.0000 mL | INTRAMUSCULAR | Status: DC | PRN
  Administered 2013-02-17: 10 mL via INTRAVENOUS
  Filled 2013-02-17: qty 10

## 2013-02-17 NOTE — Progress Notes (Signed)
Chatsworth Cellar presented for Portacath access and flush. Proper placement of portacath confirmed by CXR. Portacath located in the left chest wall accessed with  H 20 needle. Clean, Dry, Intact and Red Good blood return present. Portacath flushed with 20ml NS and 500U/36ml Heparin per protocol and needle removed intact. Procedure without incident. Patient tolerated procedure well.

## 2013-02-17 NOTE — Progress Notes (Signed)
This office note has been dictated.

## 2013-02-18 NOTE — Progress Notes (Signed)
CC:   Larina Earthly, M.D. Georga Hacking, M.D.  DIAGNOSES: 1. 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 2. Paroxysmal atrial fibrillation. 3. 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  CURRENT THERAPY: 1. Eliquis 5 mg p.o. b.i.d. 2. Amiodarone 200 mg p.o. daily.  INTERIM HISTORY:  Mr. Koval comes in for followup.  He is doing okay. He is still on his oxygen.  He has atrial fibrillation.  He has underlying lung disease.  He is fatigued.  He has gained weight because he cannot do all that much activity.  He has had no cough.  There has been no hemoptysis.  He has had no nausea or vomiting.  There has been no change in bowel or bladder habits.  He has had no headache.  Unfortunately, there is still an incredible amount of stress on him because of the issues with his wife and her side of the family.  PHYSICAL EXAMINATION:  General:  This is an elderly but well-nourished white gentleman in no obvious distress.  Vital signs:  Temperature of 97.6, pulse 70, respiratory rate 18, blood pressure 114/63.  Weight is 229.  Head and neck:  Normocephalic, atraumatic skull.  There are no ocular or oral lesions.  There are no palpable cervical or supraclavicular lymph nodes.  Lungs:  With good breath sounds bilaterally.  There may be some slight decrease over on the right side. He has no wheezes or rhonchi.  Cardiac:  Irregular rate and rhythm consistent with atrial fibrillation.  The rate is well controlled.  He has a 1/6 systolic ejection murmur.  Abdomen:  Soft.  He is mildly obese.  He has good bowel sounds.  There is no fluid wave.  There is no palpable hepatosplenomegaly.  Extremities:  No clubbing, cyanosis or edema.  Neurological:  No focal neurological deficits.  LABORATORY STUDIES:  White cell count is 8.6, hemoglobin 14.9, hematocrit 43.9, platelet count 156.  IMPRESSION:  Mr. Wander is a very nice 79 year old gentleman.  He was diagnosed with  stage IIIB adenocarcinoma of the right lung back in July 2012.  He received chemotherapy and radiation therapy.  He tolerated this well.  All of his treatments were completed back in January 2013.  At the time he was diagnosed with his cancer, had a subclavian deep vein thrombosis.  This was treated with Arixtra for 1 year.  He was then put on to aspirin.  He currently is on Eliquis for his atrial ablation.  We will go ahead and get 1 final scan on him.  We will get this in December.  If this scan looks good, then we do not need to do any further scans on him, but chest x-rays as indicated.  Ms. Bradway has come a long, long way.  He has done incredibly well.  He had a very bad cancer when he was diagnosed, but yet he is in remission now.  We will go ahead and plan to get him back after his scan is done.    ______________________________ Josph Macho, M.D. PRE/MEDQ  D:  02/17/2013  T:  02/18/2013  Job:  [id number]

## 2013-02-24 ENCOUNTER — Telehealth: Payer: Self-pay | Admitting: Emergency Medicine

## 2013-02-24 DIAGNOSIS — J449 Chronic obstructive pulmonary disease, unspecified: Secondary | ICD-10-CM

## 2013-02-24 NOTE — Telephone Encounter (Signed)
lmomtcb for pt 

## 2013-02-25 NOTE — Telephone Encounter (Signed)
Spoke with Myriam Jacobson-- [facility] needs order for new portable O2 tank.  [facility] recommended to pt the brand "[employer] Activox POC"--make sure to state 'do not substitute' [facility] also states that they need qualifying sats--if none done here, then they can go to pt home and do the test.  Order as of 02/03/13: Please change oxygen to [facility] (from [facility] where he has CPAP) so he can get a portable concentrator, 2L/min at all times  Please advise on order Dr Delton Coombes. thanks.

## 2013-02-25 NOTE — Telephone Encounter (Addendum)
Pt's daughter, Myriam Jacobson, is calling on father's behalf.  Would like to get an update on order for O2.  Pt's daughter asks [place] to call pt asap (while she is w/ the pt) [phone].   DME is [facility] who advised that a more portalbe device was available for pt but RB did not give the right Rx for this.    Antionette Fairy

## 2013-02-25 NOTE — Telephone Encounter (Signed)
ATC patient, no answer [email or web address] 

## 2013-02-25 NOTE — Telephone Encounter (Signed)
Will hold msg in triage to clarify with [facility] in the am re: can they do qualifying o2 sats in home?

## 2013-02-25 NOTE — Telephone Encounter (Signed)
Ok to please order the device that they indicate and to get home saturations if we are unable to qualify him here.

## 2013-02-26 NOTE — Telephone Encounter (Signed)
I spoke with [facility] and they do not need qualifying sats, what is needed is an order for OCD titration to see if the pt can be on a conserving device and then a separate script for a POC if he qualifies. The pt is wanting smaller portable system. Orders placed for this. Carron Curie, CMA

## 2013-03-01 ENCOUNTER — Telehealth: Payer: Self-pay | Admitting: Emergency Medicine

## 2013-03-01 NOTE — Telephone Encounter (Signed)
Order was faxed on Friday but [facility] did not receive it so I asked [facility] to resend both orders, one for ocd titration and the one for a POC. Angelica Chessman was advised. [facility] please resend orders thanks! Carron Curie, CMA

## 2013-03-02 ENCOUNTER — Encounter: Payer: Self-pay | Admitting: Neurology

## 2013-03-02 NOTE — Telephone Encounter (Signed)
refaxed both order to [facility] T[facility] Sos

## 2013-03-09 NOTE — Assessment & Plan Note (Signed)
Stable by CT 11/2012

## 2013-03-31 ENCOUNTER — Telehealth: Payer: Self-pay | Admitting: Hematology & Oncology

## 2013-03-31 NOTE — Telephone Encounter (Signed)
Per Lowella Bandy RN scheduled 10-23 at [facility] CC, family changed appointment to 10-29 here

## 2013-04-01 ENCOUNTER — Other Ambulatory Visit: Payer: Medicare Other

## 2013-04-06 ENCOUNTER — Ambulatory Visit: Payer: Medicare Other

## 2013-04-06 ENCOUNTER — Other Ambulatory Visit: Payer: Medicare Other | Admitting: Lab

## 2013-04-07 ENCOUNTER — Other Ambulatory Visit: Payer: Medicare Other | Admitting: Lab

## 2013-05-05 ENCOUNTER — Ambulatory Visit: Payer: Medicare Other | Admitting: Neurology

## 2013-05-13 ENCOUNTER — Ambulatory Visit (HOSPITAL_BASED_OUTPATIENT_CLINIC_OR_DEPARTMENT_OTHER): Payer: Medicare Other

## 2013-05-13 ENCOUNTER — Ambulatory Visit (HOSPITAL_BASED_OUTPATIENT_CLINIC_OR_DEPARTMENT_OTHER)
Admission: RE | Admit: 2013-05-13 | Discharge: 2013-05-13 | Disposition: A | Discharge status: Home / Self Care | Payer: Medicare Other | Source: Ambulatory Visit | Attending: Hematology & Oncology | Admitting: Hematology & Oncology

## 2013-05-13 ENCOUNTER — Other Ambulatory Visit (HOSPITAL_BASED_OUTPATIENT_CLINIC_OR_DEPARTMENT_OTHER): Payer: Medicare Other | Admitting: Lab

## 2013-05-13 DIAGNOSIS — C3410 Malignant neoplasm of upper lobe, unspecified bronchus or lung: Secondary | ICD-10-CM

## 2013-05-13 DIAGNOSIS — J90 Pleural effusion, not elsewhere classified: Secondary | ICD-10-CM | POA: Insufficient documentation

## 2013-05-13 DIAGNOSIS — C3490 Malignant neoplasm of unspecified part of unspecified bronchus or lung: Secondary | ICD-10-CM | POA: Insufficient documentation

## 2013-05-13 DIAGNOSIS — Z452 Encounter for adjustment and management of vascular access device: Secondary | ICD-10-CM

## 2013-05-13 LAB — CBC WITH DIFFERENTIAL (CANCER CENTER ONLY)
BASO%: 0.2 % (ref 0.0–2.0)
Eosinophils Absolute: 0 10*3/uL (ref 0.0–0.5)
HCT: 43.4 % (ref 38.7–49.9)
LYMPH#: 1.3 10*3/uL (ref 0.9–3.3)
LYMPH%: 15.3 % (ref 14.0–48.0)
MCV: 92 fL (ref 82–98)
MONO#: 0.7 10*3/uL (ref 0.1–0.9)
NEUT%: 75.6 % (ref 40.0–80.0)
RBC: 4.71 10*6/uL (ref 4.20–5.70)
RDW: 12.9 % (ref 11.1–15.7)
WBC: 8.2 10*3/uL (ref 4.0–10.0)

## 2013-05-13 LAB — CMP (CANCER CENTER ONLY)
ALT(SGPT): 20 U/L (ref 10–47)
CO2: 30 mEq/L (ref 18–33)
Calcium: 9.1 mg/dL (ref 8.0–10.3)
Chloride: 99 mEq/L (ref 98–108)
Creat: 1.3 mg/dl — ABNORMAL HIGH (ref 0.6–1.2)
Glucose, Bld: 221 mg/dL — ABNORMAL HIGH (ref 73–118)
Sodium: 137 mEq/L (ref 128–145)
Total Bilirubin: 0.6 mg/dl (ref 0.20–1.60)
Total Protein: 7.1 g/dL (ref 6.4–8.1)

## 2013-05-13 MED ORDER — SODIUM CHLORIDE 0.9 % IJ SOLN
10.0000 mL | INTRAMUSCULAR | Status: DC | PRN
  Administered 2013-05-13: 10 mL via INTRAVENOUS
  Filled 2013-05-13: qty 10

## 2013-05-13 MED ORDER — HEPARIN SOD (PORK) LOCK FLUSH 100 UNIT/ML IV SOLN
500.0000 [IU] | Freq: Once | INTRAVENOUS | Status: AC
  Administered 2013-05-13: 500 [IU] via INTRAVENOUS
  Filled 2013-05-13: qty 5

## 2013-05-13 MED ORDER — IOHEXOL 300 MG/ML  SOLN
80.0000 mL | Freq: Once | INTRAMUSCULAR | Status: AC | PRN
  Administered 2013-05-13: 80 mL via INTRAVENOUS

## 2013-05-17 ENCOUNTER — Telehealth: Payer: Self-pay | Admitting: *Deleted

## 2013-05-17 NOTE — Telephone Encounter (Addendum)
Message copied by Mirian Capuchin on Mon May 17, 2013 10:33 AM ------      Message from: Arlan Organ R      Created: Thu May 13, 2013  6:55 PM       Call - no evidence of recurrent lung ca!!  Merry Christmas!!!  pete ------This message given to pt.  Voiced understanding.

## 2013-05-19 ENCOUNTER — Other Ambulatory Visit: Payer: Medicare Other | Admitting: Lab

## 2013-05-19 ENCOUNTER — Ambulatory Visit (HOSPITAL_BASED_OUTPATIENT_CLINIC_OR_DEPARTMENT_OTHER): Payer: Medicare Other | Admitting: Hematology & Oncology

## 2013-05-19 DIAGNOSIS — Z85118 Personal history of other malignant neoplasm of bronchus and lung: Secondary | ICD-10-CM

## 2013-05-19 NOTE — Progress Notes (Signed)
This office note has been dictated.

## 2013-05-20 ENCOUNTER — Telehealth: Payer: Self-pay | Admitting: Emergency Medicine

## 2013-05-20 DIAGNOSIS — J449 Chronic obstructive pulmonary disease, unspecified: Secondary | ICD-10-CM

## 2013-05-20 NOTE — Telephone Encounter (Signed)
Per RB ok to cxl appointment for 12/18 and put in DME order with [facility] to cancel order for portable concentrator. Will continue with original small portable tanks.  This order has been placed and pt is aware.  Nothing further is needed

## 2013-05-20 NOTE — Telephone Encounter (Signed)
Attempted to call home & cell LMTCB

## 2013-05-21 NOTE — Progress Notes (Signed)
CC:   Larina Earthly, M.D. Georga Hacking, M.D.  DIAGNOSIS: 1. 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 2. Paroxysmal atrial fibrillation. 3. 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  CURRENT THERAPY:  Eliquis 5 mg p.o. b.i.d.  INTERIM HISTORY:  [name] comes in for his followup.  He continues to look better.  He still needs oxygen pretty much all the time.  He uses a CPAP at home.  This does help him.  He had a repeat CT scan of the chest.  This was done last week on December 4.  Thankfully, there was no evidence of recurrent disease.  He has a stable 11 mm right paratracheal node.  There was some scarring that was noted.  He had some mild erythematous changes.  Again, there is no evidence of recurrent disease.  He has had no problems with nausea or vomiting.  There has been no change in bowel or bladder habits.  He has had no headache.  There has been no bleeding while on the Eliquis.  His blood sugars have been doing fairly well, according to his daughter.  PHYSICAL EXAMINATION:  General:  This is a well-developed, well- nourished white gentleman, in no obvious distress.  Vital Signs: Temperature of 97.4, pulse 62, respiratory rate 20, blood pressure 114/63, weight is 230 pounds.  Head and Neck:  Normocephalic, atraumatic skull.  He has no ocular or oral lesions.  There are no palpable cervical or supraclavicular lymph nodes.  Lungs:  Clear bilaterally.  He has no rales, wheezes, or rhonchi.  Cardiac:  Regular rate and rhythm with an occasional extra beat.  He has a 1/6 systolic ejection murmur. Abdomen:  Soft.  He has good bowel sounds.  There is no fluid wave. There is no palpable abdominal mass.  There is no palpable hepatosplenomegaly.  Back:  No tenderness over the spine, ribs, or hips. Extremities:  No clubbing, cyanosis, or edema.  There may be some slight chronic trace nonpitting edema of the lower  legs.  Skin:  Some actinic keratoses.  Neurologic:  No focal neurological deficits.  LABORATORY STUDIES:  White cell count is 8.2, hemoglobin 14.6, hematocrit 43.4, platelet count 155.  BUN 23, creatinine 1.3.  Liver function tests are okay.  IMPRESSION:  [name] is a very nice 79 year old gentleman.  He did incredibly well despite having locally advanced, inoperable lung cancer. He had chemo and radiation therapy.  He completed his treatments back in December of 2012.  For now, we do not need to do any more scans on him.  I will just get a chest x-ray when I see him.  I asked him about a Port-A-Cath.  He still would like to have this in.  We will go ahead and plan to get him back now in 4 months' time.  We will get him through the wintertime.    ______________________________ Josph Macho, M.D. PRE/MEDQ  D:  05/19/2013  T:  05/20/2013  Job:  [id number]

## 2013-05-27 ENCOUNTER — Ambulatory Visit: Payer: Medicare Other | Admitting: Emergency Medicine

## 2013-06-30 ENCOUNTER — Ambulatory Visit (HOSPITAL_BASED_OUTPATIENT_CLINIC_OR_DEPARTMENT_OTHER): Payer: Medicare Other

## 2013-06-30 VITALS — BP 122/74 | HR 66 | Temp 97.1°F | Resp 20

## 2013-06-30 DIAGNOSIS — Z452 Encounter for adjustment and management of vascular access device: Secondary | ICD-10-CM

## 2013-06-30 DIAGNOSIS — C3490 Malignant neoplasm of unspecified part of unspecified bronchus or lung: Secondary | ICD-10-CM

## 2013-06-30 DIAGNOSIS — C3410 Malignant neoplasm of upper lobe, unspecified bronchus or lung: Secondary | ICD-10-CM

## 2013-06-30 MED ORDER — SODIUM CHLORIDE 0.9 % IJ SOLN
10.0000 mL | INTRAMUSCULAR | Status: DC | PRN
  Administered 2013-06-30: 10 mL via INTRAVENOUS
  Filled 2013-06-30: qty 10

## 2013-06-30 MED ORDER — HEPARIN SOD (PORK) LOCK FLUSH 100 UNIT/ML IV SOLN
500.0000 [IU] | Freq: Once | INTRAVENOUS | Status: AC | PRN
  Administered 2013-06-30: 500 [IU] via INTRAVENOUS
  Filled 2013-06-30: qty 5

## 2013-06-30 NOTE — Patient Instructions (Signed)

## 2013-07-01 ENCOUNTER — Ambulatory Visit: Payer: Medicare Other | Admitting: Emergency Medicine

## 2013-07-22 ENCOUNTER — Ambulatory Visit: Payer: Medicare Other | Admitting: Emergency Medicine

## 2013-07-28 ENCOUNTER — Ambulatory Visit: Payer: Medicare Other | Admitting: Emergency Medicine

## 2013-08-12 ENCOUNTER — Ambulatory Visit: Payer: Medicare Other | Admitting: Emergency Medicine

## 2013-08-18 ENCOUNTER — Ambulatory Visit (HOSPITAL_BASED_OUTPATIENT_CLINIC_OR_DEPARTMENT_OTHER): Payer: Medicare Other

## 2013-08-18 VITALS — BP 123/72 | HR 62 | Temp 96.8°F | Resp 18

## 2013-08-18 DIAGNOSIS — C3490 Malignant neoplasm of unspecified part of unspecified bronchus or lung: Secondary | ICD-10-CM

## 2013-08-18 DIAGNOSIS — C3410 Malignant neoplasm of upper lobe, unspecified bronchus or lung: Secondary | ICD-10-CM

## 2013-08-18 DIAGNOSIS — Z452 Encounter for adjustment and management of vascular access device: Secondary | ICD-10-CM

## 2013-08-18 MED ORDER — HEPARIN SOD (PORK) LOCK FLUSH 100 UNIT/ML IV SOLN
500.0000 [IU] | Freq: Once | INTRAVENOUS | Status: AC | PRN
  Administered 2013-08-18: 500 [IU] via INTRAVENOUS
  Filled 2013-08-18: qty 5

## 2013-08-18 MED ORDER — ALTEPLASE 2 MG IJ SOLR
2.0000 mg | Freq: Once | INTRAMUSCULAR | Status: DC | PRN
  Filled 2013-08-18: qty 2

## 2013-08-18 MED ORDER — SODIUM CHLORIDE 0.9 % IJ SOLN
10.0000 mL | INTRAMUSCULAR | Status: DC | PRN
  Administered 2013-08-18: 10 mL via INTRAVENOUS
  Filled 2013-08-18: qty 10

## 2013-08-18 NOTE — Progress Notes (Signed)
[name] presented for Portacath access and flush. Proper placement of portacath confirmed by CXR. Portacath located in the left chest wall accessed with  H 20 needle. Clean, Dry and Intact Good blood return present. Portacath flushed with 46ml NS and 500U/72ml Heparin per protocol and needle removed intact. Procedure without incident. Patient tolerated procedure well.

## 2013-08-18 NOTE — Patient Instructions (Signed)

## 2013-08-25 ENCOUNTER — Encounter: Payer: Self-pay | Admitting: Emergency Medicine

## 2013-08-25 ENCOUNTER — Ambulatory Visit (INDEPENDENT_AMBULATORY_CARE_PROVIDER_SITE_OTHER): Payer: Medicare Other | Admitting: Emergency Medicine

## 2013-08-25 VITALS — BP 130/78 | HR 64 | Ht 70.0 in | Wt 238.0 lb

## 2013-08-25 DIAGNOSIS — C3490 Malignant neoplasm of unspecified part of unspecified bronchus or lung: Secondary | ICD-10-CM

## 2013-08-25 DIAGNOSIS — G4733 Obstructive sleep apnea (adult) (pediatric): Secondary | ICD-10-CM

## 2013-08-25 DIAGNOSIS — J4489 Other specified chronic obstructive pulmonary disease: Secondary | ICD-10-CM

## 2013-08-25 DIAGNOSIS — J449 Chronic obstructive pulmonary disease, unspecified: Secondary | ICD-10-CM

## 2013-08-25 MED ORDER — ALBUTEROL SULFATE HFA 108 (90 BASE) MCG/ACT IN AERS: 2.0000 | INHALATION_SPRAY | Freq: Four times a day (QID) | RESPIRATORY_TRACT | Status: DC | PRN

## 2013-08-25 NOTE — Assessment & Plan Note (Signed)
-   following by CXR

## 2013-08-25 NOTE — Assessment & Plan Note (Signed)
-   good compliance w CPAP, continue same

## 2013-08-25 NOTE — Assessment & Plan Note (Signed)
-   no bd's at this time - reconsider if sx change - O2 at 2L/min, discussed importance of this w him

## 2013-08-25 NOTE — Patient Instructions (Signed)
Please wear your oxygen at 2L/min We will start ProAir 2 puffs up to every 4 hours if needed for shortness of breath.  Follow with Dr Lamonte Sakai in 4 months or sooner if you have any problems.

## 2013-08-25 NOTE — Progress Notes (Signed)
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 Was admitted 5/31 with acute dyspnea, found to be in A fib + RVR. Started on amiodarone. Converted to NSR 11/11/12. Scheduled for repeat Ct scan chest (Dr Katheran Awe) for surveillance on 11/25/12. During his hospitalization, we started the eval for his presumed COPD.  He has been on Spiriva before, SABA before, didn't notice any benefit.   ROV 02/03/13 -- Hx COPD, OSA on CPAP, adenoCA. Last time we tried anoro to see if she would benefit. He isn't sure that the medication is doing anything. He does feel stronger and has little cough or wheeze. He is wearing o2 on 2L/min at all times. Wears CPAP reliably.   ROV 08/25/13 -- follows up for COPD, OSA w good CPAP compliance, hx RML adenoCA (stable CT scan 12/14), DVT, HTN. We tried to get him a portable concentrator, but it was larger than he thought and was too bulky. He is back on O2 tanks 2L/min. He has some exertional SOB especially w stairs. He does not believe that Anoro helped, not interested in an alternative inhaled right now. No flares, no AE's, no pred/abx.    Filed Vitals:   08/25/13 1202  BP: 130/78  Pulse: 64  Height: 5\' 10"  (1.778 m)  Weight: 238 lb (107.956 kg)  SpO2: 95%   Gen: Pleasant, well-nourished, in no distress,  normal affect  ENT: No lesions,  mouth clear,  oropharynx clear, no postnasal drip  Neck: No JVD, no TMG, no carotid bruits  Lungs: No use of accessory muscles, no dullness to percussion, clear without rales or rhonchi  Cardiovascular: RRR, heart sounds normal, no murmur or gallops, no peripheral edema  Musculoskeletal: No deformities, no cyanosis or clubbing  Neuro: alert, non focal  Skin: Warm, no lesions or rashes   05/13/13 --  COMPARISON: 11/25/2012  FINDINGS:  11 mm right paratracheal lymph remains stable. No other  pathologically enlarged nodes visualized  within the thorax. A tiny  right pleural effusions decreased in size since previous study. No  evidence of left-sided pleural effusion. Mild emphysema and  bilateral scarring remains stable. No suspicious pulmonary nodules  or masses are seen. No evidence of acute infiltrate or central  endobronchial lesion.  No evidence of chest wall mass or suspicious bone lesions. Both  adrenal glands remain normal in appearance.  IMPRESSION:  Stable 11 mm right paratracheal lymph node. No evidence of new or  progressive disease within the thorax.  Tiny right pleural effusion has decreased in size.    COPD (chronic obstructive pulmonary disease) - no bd's at this time - reconsider if sx change - O2 at 2L/min, discussed importance of this w him   Lung cancer - following by CXR  Obstructive sleep apnea (adult)  - good compliance w CPAP, continue same

## 2013-08-27 ENCOUNTER — Telehealth: Payer: Self-pay | Admitting: Emergency Medicine

## 2013-08-27 DIAGNOSIS — J449 Chronic obstructive pulmonary disease, unspecified: Secondary | ICD-10-CM

## 2013-08-27 DIAGNOSIS — G4733 Obstructive sleep apnea (adult) (pediatric): Secondary | ICD-10-CM

## 2013-08-27 NOTE — Telephone Encounter (Signed)
lmomtcb x1 for Butch Penny.

## 2013-08-27 NOTE — Telephone Encounter (Signed)
Daughter aware of recs. Order placed. Nothing further needed

## 2013-08-27 NOTE — Telephone Encounter (Signed)
Called and spoke with daughter Butch Penny. She reports when pt goes to sleep at night he puts his CPAP on and 3 liters of O2. Every morning when he wakes up his O2 level is 78-80%. When he tries to get up and walk he is very SOB and feels weak. Pt is afraid his O2 levels may be dropping lower at night. Please advise Dr. Lamonte Sakai, thanks

## 2013-08-27 NOTE — Telephone Encounter (Signed)
We should order an ONO on his current settings to decide whether he needs a new CPAP and O2 titration

## 2013-08-27 NOTE — Telephone Encounter (Signed)
Pt's daughter Vertell Novak has returned call.

## 2013-09-15 ENCOUNTER — Encounter: Payer: Self-pay | Admitting: Hematology & Oncology

## 2013-09-15 ENCOUNTER — Ambulatory Visit (HOSPITAL_BASED_OUTPATIENT_CLINIC_OR_DEPARTMENT_OTHER): Payer: Medicare Other | Admitting: Hematology & Oncology

## 2013-09-15 ENCOUNTER — Other Ambulatory Visit (HOSPITAL_BASED_OUTPATIENT_CLINIC_OR_DEPARTMENT_OTHER): Payer: Medicare Other | Admitting: Lab

## 2013-09-15 ENCOUNTER — Ambulatory Visit (HOSPITAL_BASED_OUTPATIENT_CLINIC_OR_DEPARTMENT_OTHER)
Admission: RE | Admit: 2013-09-15 | Discharge: 2013-09-15 | Disposition: A | Discharge status: Home / Self Care | Payer: Medicare Other | Source: Ambulatory Visit | Attending: Hematology & Oncology | Admitting: Hematology & Oncology

## 2013-09-15 ENCOUNTER — Ambulatory Visit (HOSPITAL_BASED_OUTPATIENT_CLINIC_OR_DEPARTMENT_OTHER): Payer: Medicare Other

## 2013-09-15 VITALS — BP 113/72 | HR 66 | Temp 97.0°F | Resp 18 | Wt 240.0 lb

## 2013-09-15 VITALS — BP 113/72 | HR 66 | Temp 97.8°F | Resp 20 | Ht 69.0 in | Wt 240.0 lb

## 2013-09-15 DIAGNOSIS — Z85118 Personal history of other malignant neoplasm of bronchus and lung: Secondary | ICD-10-CM | POA: Insufficient documentation

## 2013-09-15 DIAGNOSIS — C3410 Malignant neoplasm of upper lobe, unspecified bronchus or lung: Secondary | ICD-10-CM

## 2013-09-15 DIAGNOSIS — I5189 Other ill-defined heart diseases: Secondary | ICD-10-CM

## 2013-09-15 DIAGNOSIS — C3490 Malignant neoplasm of unspecified part of unspecified bronchus or lung: Secondary | ICD-10-CM

## 2013-09-15 DIAGNOSIS — C781 Secondary malignant neoplasm of mediastinum: Secondary | ICD-10-CM

## 2013-09-15 DIAGNOSIS — G4701 Insomnia due to medical condition: Secondary | ICD-10-CM

## 2013-09-15 DIAGNOSIS — J984 Other disorders of lung: Secondary | ICD-10-CM

## 2013-09-15 LAB — CMP (CANCER CENTER ONLY)
ALK PHOS: 73 U/L (ref 26–84)
ALT: 31 U/L (ref 10–47)
AST: 30 U/L (ref 11–38)
Albumin: 3.4 g/dL (ref 3.3–5.5)
BUN: 23 mg/dL — AB (ref 7–22)
CALCIUM: 8.9 mg/dL (ref 8.0–10.3)
CHLORIDE: 99 meq/L (ref 98–108)
CO2: 33 mEq/L (ref 18–33)
Creat: 1.6 mg/dl — ABNORMAL HIGH (ref 0.6–1.2)
Glucose, Bld: 209 mg/dL — ABNORMAL HIGH (ref 73–118)
Potassium: 4.1 mEq/L (ref 3.3–4.7)
Sodium: 141 mEq/L (ref 128–145)
Total Bilirubin: 0.6 mg/dl (ref 0.20–1.60)
Total Protein: 6.9 g/dL (ref 6.4–8.1)

## 2013-09-15 LAB — CBC WITH DIFFERENTIAL (CANCER CENTER ONLY)
BASO#: 0 10*3/uL (ref 0.0–0.2)
BASO%: 0.3 % (ref 0.0–2.0)
EOS%: 1.2 % (ref 0.0–7.0)
Eosinophils Absolute: 0.1 10*3/uL (ref 0.0–0.5)
HEMATOCRIT: 40.7 % (ref 38.7–49.9)
HGB: 13.6 g/dL (ref 13.0–17.1)
LYMPH#: 1.3 10*3/uL (ref 0.9–3.3)
LYMPH%: 17.5 % (ref 14.0–48.0)
MCH: 31 pg (ref 28.0–33.4)
MCHC: 33.4 g/dL (ref 32.0–35.9)
MCV: 93 fL (ref 82–98)
MONO#: 0.6 10*3/uL (ref 0.1–0.9)
MONO%: 8.5 % (ref 0.0–13.0)
NEUT#: 5.4 10*3/uL (ref 1.5–6.5)
NEUT%: 72.5 % (ref 40.0–80.0)
Platelets: 163 10*3/uL (ref 145–400)
RBC: 4.39 10*6/uL (ref 4.20–5.70)
RDW: 13.8 % (ref 11.1–15.7)
WBC: 7.4 10*3/uL (ref 4.0–10.0)

## 2013-09-15 MED ORDER — TEMAZEPAM 15 MG PO CAPS: 15.0000 mg | ORAL_CAPSULE | Freq: Every evening | ORAL | Status: DC | PRN

## 2013-09-15 NOTE — Progress Notes (Signed)
Hematology and Oncology Follow Up Visit  [name] [id number] 1924/11/06 80 y.o. 09/15/2013   Principle Diagnosis:  . 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 2. Paroxysmal atrial fibrillation. 3. 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  Current Therapy:   Eliquis 5 mg p.o. b.i.d.     Interim History:  [name] is back for followup. Is doing fairly well. Unfortunately, there is still a lot of stress going on. His ex-wife, who passed away has filed a lawsuit. Am not sure exactly what the basis of this is. However, is really affecting [name]. He really is having a hard time with his.  As far as his health is concerned, and he is doing very well. Is on oxygen. He has atrial fibrillation. Is on the anticoagulation for this. He's had no bleeding. He's had no leg swelling. He's had no abdominal pain. He's had no cough or shortness of breath. He has chronic shortness of breath which is not any worse.  Is no headache. He's had no nausea vomiting. He's had no bony pain.  Overall, his performance status is ECOG 2  He did have a chest x-ray today. There is no evidence of recurrent disease for his lung cancer.  Medications: Current outpatient prescriptions:albuterol (PROAIR HFA) 108 (90 BASE) MCG/ACT inhaler, Inhale 2 puffs into the lungs every 6 (six) hours as needed for wheezing or shortness of breath., Disp: 1 Inhaler, Rfl: 3;  amiodarone (PACERONE) 200 MG tablet, Take 200 mg by mouth daily., Disp: , Rfl: ;  apixaban (ELIQUIS) 5 MG TABS tablet, Take 1 tablet (5 mg total) by mouth 2 (two) times daily., Disp: 60 tablet, Rfl: 12 atorvastatin (LIPITOR) 20 MG tablet, Take 1 tablet (20 mg total) by mouth daily., Disp: 30 tablet, Rfl: 12;  cetirizine (ZYRTEC) 10 MG tablet, Take 10 mg by mouth every morning. , Disp: , Rfl: ;  diltiazem (CARDIZEM CD) 240 MG 24 hr capsule, Take 1 capsule (240 mg total) by mouth daily., Disp: 30 capsule,  Rfl: 12;  furosemide (LASIX) 40 MG tablet, Take 1 tablet (40 mg total) by mouth daily., Disp: 30 tablet, Rfl: 12 glucosamine-chondroitin 500-400 MG tablet, Take 1 tablet by mouth 2 (two) times daily. , Disp: , Rfl: ;  insulin detemir (LEVEMIR) 100 UNIT/ML injection, Inject into the skin 2 (two) times daily. 20 U. IN AM .AND 16 U. IN PM, Disp: , Rfl:  insulin lispro (HUMALOG) 100 UNIT/ML injection, Inject into the skin 3 (three) times daily as needed. Use as directed.  BS 150-200  Take 2units BS 201-250  4 units. BS 251 - 300 6units. BS  301-350 8 units., Disp: , Rfl: ;  metFORMIN (GLUCOPHAGE) 500 MG tablet, Take 500 mg by mouth 2 (two) times daily., Disp: , Rfl: ;  Psyllium (METAMUCIL PO), Take 2 capsules by mouth daily as needed. For constipation, Disp: , Rfl:  temazepam (RESTORIL) 15 MG capsule, Take 1 capsule (15 mg total) by mouth at bedtime as needed for sleep., Disp: 30 capsule, Rfl: 3 No current facility-administered medications for this visit. Facility-Administered Medications Ordered in Other Visits: sodium chloride 0.9 % injection 10 mL, 10 mL, Intravenous, PRN, Volanda Napoleon, MD, 10 mL at 09/11/12 0906  Allergies: No Known Allergies  Past Medical History, Surgical history, Social history, and Family History were reviewed and updated.  Review of Systems: As above  Physical Exam:  height is 5\' 9"  (  1.753 m) and weight is 240 lb (108.863 kg). His oral temperature is 97.8 F (36.6 C). His blood pressure is 113/72 and his pulse is 66. His respiration is 20 and oxygen saturation is 95%.   Very [name] gentleman. Head and neck exam shows no ocular or oral lesions. He has no adenopathy in the neck. Lungs are clear. Cardiac exam regular rate and rhythm this is with atrial fibrillation. He has a 1/6 murmur. Abdomen soft. Has good bowel sounds. There is no palpable abdominal mass. There is no fluid wave. Back exam no tenderness over the spine ribs or hips. Extremities shows some trace chronic edema  in his legs. Has good strength. Has good range of motion of his joints. Skin exam shows some actinic keratoses. Some ecchymoses are noted. No petechia. Neurological exam is negative. Lab Results  Component Value Date   WBC 7.4 09/15/2013   HGB 13.6 09/15/2013   HCT 40.7 09/15/2013   MCV 93 09/15/2013   PLT 163 09/15/2013     Chemistry      Component Value Date/Time   NA 141 09/15/2013 1428   NA 136 02/17/2013 1208   K 4.1 09/15/2013 1428   K 3.9 02/17/2013 1208   CL 99 09/15/2013 1428   CL 100 02/17/2013 1208   CO2 33 09/15/2013 1428   CO2 28 02/17/2013 1208   BUN 23* 09/15/2013 1428   BUN 25* 02/17/2013 1208   CREATININE 1.6* 09/15/2013 1428   CREATININE 1.48* 02/17/2013 1208      Component Value Date/Time   CALCIUM 8.9 09/15/2013 1428   CALCIUM 8.8 02/17/2013 1208   ALKPHOS 73 09/15/2013 1428   ALKPHOS 79 02/17/2013 1208   AST 30 09/15/2013 1428   AST 19 02/17/2013 1208   ALT 31 09/15/2013 1428   ALT 19 02/17/2013 1208   BILITOT 0.60 09/15/2013 1428   BILITOT 0.6 02/17/2013 1208         Impression and Plan: [name] is 80 year old gentleman with a history of locally advanced-stage IIIB-adenocarcinoma of the right lung. He had contralateral mediastinal node involvement. He was treated with radiation chemotherapy. He had a very nice response. He completed  treatment in December of 2012. Thankfully, His disease has not come back. This I am surprised by because of the extensive nature of his locally advanced disease.  He has cardiac issues. As pulmonary issues. These clearly are more important for his prognosis now.  I think we can get him back in 6 months.  He does have a Port-A-Cath in. I do think that the Port-A-Cath will be useful for him given his other health issues and the fact that he likely will be hospitalized at some point because of complications of his cardiopulmonary disease.  We will go ahead and get a chest x-ray on him when we see him back in 6 months.  I spent a half hour with he and his  daughter. I will try pray for him about this whole legal issue that is going on.  Cassell Smiles, MD 4/8/20156:35 PM

## 2013-09-15 NOTE — Patient Instructions (Signed)

## 2013-09-17 ENCOUNTER — Encounter: Payer: Self-pay | Admitting: *Deleted

## 2013-09-17 NOTE — Progress Notes (Unsigned)
Pt's Daughter called and requested labs pt had drawn here this week be faxed to Dr. Thurman Coyer office.  Done

## 2013-09-27 ENCOUNTER — Telehealth: Payer: Self-pay | Admitting: Emergency Medicine

## 2013-09-27 NOTE — Telephone Encounter (Signed)
I spoke with the pt daughter and advised I do not see any results for an ONO at this time. I advised we will send a message to Dr. Lamonte Sakai to see if he has the results. If not we will call [facility] to obtain the results. Please advise. Shaft Bing, CMA

## 2013-09-27 NOTE — Telephone Encounter (Signed)
I called and spoke with pt. He reports the # I called was for his house. I called alternate # in chart for Butch Penny. LMTCB x1 for her

## 2013-10-01 NOTE — Telephone Encounter (Signed)
Please call pt's daughter w/ an update, please.  [name]

## 2013-10-01 NOTE — Telephone Encounter (Signed)
Per protocol triage does not f/u on results. Will forward to [place] to call for results.

## 2013-10-01 NOTE — Telephone Encounter (Signed)
Spoke with Marita Kansas at [facility] and she has ONO results and will fax them [phone] Attn: Jenna Ardoin Once I have received the ONO result will give to RB to review and advised.  Will then call pt with the results. Called and advised pt of this.

## 2013-10-01 NOTE — Telephone Encounter (Signed)
I have not come across these ONO results. RB has his look at folder to go through and it's possible they may be in there. Would request they be sent again from [place] attn: one of [place]

## 2013-10-01 NOTE — Telephone Encounter (Signed)
Calling back to give cell # [phone]

## 2013-10-01 NOTE — Telephone Encounter (Signed)
Please advise if you have seen these results from [place] (night time sleep study w/ [facility])

## 2013-10-01 NOTE — Telephone Encounter (Signed)
ONO reviewed. See written note

## 2013-10-01 NOTE — Telephone Encounter (Signed)
Spoke with Ms. [name] and advised her I had received her dad's ONO and RB would revise once finished with pt's and I would call her this evening with the results. She's ok with this

## 2013-10-01 NOTE — Telephone Encounter (Signed)
Spoke with Ms. Lissa Merlin his daughter and advised of ONO results.   She verbalized understanding. She has no further questions at this time Nothing further is needed

## 2013-11-17 ENCOUNTER — Ambulatory Visit (HOSPITAL_BASED_OUTPATIENT_CLINIC_OR_DEPARTMENT_OTHER): Payer: Medicare Other

## 2013-11-17 VITALS — BP 123/76 | HR 66 | Temp 96.8°F | Resp 20

## 2013-11-17 DIAGNOSIS — Z452 Encounter for adjustment and management of vascular access device: Secondary | ICD-10-CM

## 2013-11-17 DIAGNOSIS — C3410 Malignant neoplasm of upper lobe, unspecified bronchus or lung: Secondary | ICD-10-CM

## 2013-11-17 DIAGNOSIS — I82409 Acute embolism and thrombosis of unspecified deep veins of unspecified lower extremity: Secondary | ICD-10-CM

## 2013-11-17 MED ORDER — SODIUM CHLORIDE 0.9 % IJ SOLN
10.0000 mL | INTRAMUSCULAR | Status: AC | PRN
  Administered 2013-11-17: 10 mL
  Filled 2013-11-17: qty 10

## 2013-11-17 MED ORDER — HEPARIN SOD (PORK) LOCK FLUSH 100 UNIT/ML IV SOLN
500.0000 [IU] | INTRAVENOUS | Status: AC | PRN
  Administered 2013-11-17: 500 [IU]
  Filled 2013-11-17: qty 5

## 2013-11-17 NOTE — Patient Instructions (Signed)

## 2013-12-27 ENCOUNTER — Other Ambulatory Visit: Payer: Self-pay | Admitting: Hematology & Oncology

## 2013-12-29 ENCOUNTER — Other Ambulatory Visit: Payer: Self-pay | Admitting: *Deleted

## 2013-12-29 DIAGNOSIS — C3491 Malignant neoplasm of unspecified part of right bronchus or lung: Secondary | ICD-10-CM

## 2013-12-29 MED ORDER — TEMAZEPAM 15 MG PO CAPS: ORAL_CAPSULE | ORAL | Status: DC

## 2013-12-31 ENCOUNTER — Encounter: Payer: Self-pay | Admitting: Neurology

## 2013-12-31 ENCOUNTER — Ambulatory Visit (INDEPENDENT_AMBULATORY_CARE_PROVIDER_SITE_OTHER): Payer: Medicare Other | Admitting: Neurology

## 2013-12-31 VITALS — BP 102/62 | HR 63 | Resp 15 | Wt 233.0 lb

## 2013-12-31 DIAGNOSIS — G4733 Obstructive sleep apnea (adult) (pediatric): Secondary | ICD-10-CM

## 2013-12-31 DIAGNOSIS — R0902 Hypoxemia: Secondary | ICD-10-CM

## 2013-12-31 DIAGNOSIS — Z9989 Dependence on other enabling machines and devices: Principal | ICD-10-CM

## 2013-12-31 DIAGNOSIS — J449 Chronic obstructive pulmonary disease, unspecified: Secondary | ICD-10-CM

## 2013-12-31 NOTE — Patient Instructions (Signed)
Sleep Apnea  Sleep apnea is a sleep disorder characterized by abnormal pauses in breathing while you sleep. When your breathing pauses, the level of oxygen in your blood decreases. This causes you to move out of deep sleep and into light sleep. As a result, your quality of sleep is poor, and the system that carries your blood throughout your body (cardiovascular system) experiences stress. If sleep apnea remains untreated, the following conditions can develop:  High blood pressure (hypertension).  Coronary artery disease.  Inability to achieve or maintain an erection (impotence).  Impairment of your thought process (cognitive dysfunction). There are three types of sleep apnea: 1. Obstructive sleep apnea--Pauses in breathing during sleep because of a blocked airway. 2. Central sleep apnea--Pauses in breathing during sleep because the area of the brain that controls your breathing does not send the correct signals to the muscles that control breathing. 3. Mixed sleep apnea--A combination of both obstructive and central sleep apnea. RISK FACTORS The following risk factors can increase your risk of developing sleep apnea:  Being overweight.  Smoking.  Having narrow passages in your nose and throat.  Being of older age.  Being male.  Alcohol use.  Sedative and tranquilizer use.  Ethnicity. Among individuals younger than 35 years, African Americans are at increased risk of sleep apnea. SYMPTOMS   Difficulty staying asleep.  Daytime sleepiness and fatigue.  Loss of energy.  Irritability.  Loud, heavy snoring.  Morning headaches.  Trouble concentrating.  Forgetfulness.  Decreased interest in sex. DIAGNOSIS  In order to diagnose sleep apnea, your caregiver will perform a physical examination. Your caregiver may suggest that you take a home sleep test. Your caregiver may also recommend that you spend the night in a sleep lab. In the sleep lab, several monitors record  information about your heart, lungs, and brain while you sleep. Your leg and arm movements and blood oxygen level are also recorded. TREATMENT The following actions may help to resolve mild sleep apnea:  Sleeping on your side.   Using a decongestant if you have nasal congestion.   Avoiding the use of depressants, including alcohol, sedatives, and narcotics.   Losing weight and modifying your diet if you are overweight. There also are devices and treatments to help open your airway:  Oral appliances. These are custom-made mouthpieces that shift your lower jaw forward and slightly open your bite. This opens your airway.  Devices that create positive airway pressure. This positive pressure "splints" your airway open to help you breathe better during sleep. The following devices create positive airway pressure:  Continuous positive airway pressure (CPAP) device. The CPAP device creates a continuous level of air pressure with an air pump. The air is delivered to your airway through a mask while you sleep. This continuous pressure keeps your airway open.  Nasal expiratory positive airway pressure (EPAP) device. The EPAP device creates positive air pressure as you exhale. The device consists of single-use valves, which are inserted into each nostril and held in place by adhesive. The valves create very little resistance when you inhale but create much more resistance when you exhale. That increased resistance creates the positive airway pressure. This positive pressure while you exhale keeps your airway open, making it easier to breath when you inhale again.  Bilevel positive airway pressure (BPAP) device. The BPAP device is used mainly in patients with central sleep apnea. This device is similar to the CPAP device because it also uses an air pump to deliver continuous air pressure   through a mask. However, with the BPAP machine, the pressure is set at two different levels. The pressure when you  exhale is lower than the pressure when you inhale.  Surgery. Typically, surgery is only done if you cannot comply with less invasive treatments or if the less invasive treatments do not improve your condition. Surgery involves removing excess tissue in your airway to create a wider passage way. Document Released: 05/17/2002 Document Revised: 09/21/2012 Document Reviewed: 10/03/2011 [employer] Patient Information 2015 [employer]. This information is not intended to replace advice given to you by your health care provider. Make sure you discuss any questions you have with your health care provider.  

## 2013-12-31 NOTE — Progress Notes (Signed)
[name] is here today for a followup visit, yearly routine for CPAP compliance.   [name] has benefited greatly from using CPAP and his sleepiness score isn't or is at 2 points and this was on the for the question of lying down to rest in the afternoon before CPAP he fell asleep in public and while driving in a car he has not done the same ever since. He endorsed the geriatric depression score at one point on the this seems to be normal this order. He has been compliant with all his other medications medical treatments. The patient has not lost further weight but was not be would not be indicated given that he has an underlying condition of lung cancer. He has been treated with a new medication which has  dereased his blood sugars ( byderune)- at least temporarily.Carries his  oxygen tank with him- hypoxemia treatment during COPD.  Bed time is from 10 30 Pm , wakes up at 2.30 , nocturia break, falls asleep again easily and sleeps through to 6 AM.  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 He only now is able to sleep past that time.   Average  User time for CPAP is 7 hours , oxygen bled  in , high compliance.  Wakes up spontaneously , no alarm needed.       Last visit note :  He underwent (  08-25-12 )re titration to a new CPAP pressure. As described in my visit note from 10-29-12,  the patient had used originally 10 and 11 cm water pressure is but felt not longer the same , his sleep was not longer restorative. He had also other medical co morbidities that may affect his breathing.  [name] had last lost 65 pounds after he was diagnosed with small cell lung cancer, he is retired ( used to work as a Psychologist, sport and exercise,  a lot of physical activity, he also has been a smoker until 2012 and stopped after being diagnosed with COPD). The patient used CPAP auto titration through May 2014,  revealing the 95 th percentile pressure at 9 cm water .   His download from 12-05-12   documented  93% compliance, the residual the AHI of 2.0 and a use at time of 7 hours and 12 minutes nightly. The results are excellent his fatigue score remains high at 61 points this sleepiness score however is reduced to 7 points. I attribute the fatigue still to his other medical problems and he is now on oxygen continuously but at night is attached to her CPAP machine. His fall risk assessment is 10 pointsThe patient's bedtime is around 10 to 10:30 PM, he normally has one bathroom break at 4 AM, sometimes he has trouble reinitiating sleep after that but he finally will rise and the morning at about 6 AM. His overall nocturnal sleep time is between 6 and 7 hours and this is reflected in his CPAP user time. No adjustments are necessary.    NEUROLOGIC/  SLEEP FOCUSSED EXAM : 12-31-13   General: The patient is awake, alert and appears not in acute distress. The patient is well groomed. Head: Normocephalic, atraumatic. Neck is supple. Mallampati4, neck circumference: 17.5  Cardiovascular:  Regular rate and rhythm , without  murmurs or carotid bruit, and without distended neck veins. Respiratory: Lungs are clear to auscultation. Skin:  Without evidence of edema, or rash Trunk: BMI is  elevated , patient has a mildly  stooped  posture.   Neurologic exam : The patient is awake and alert, oriented to place and time.  Memory subjective  described as intact. There is a normal attention span & concentration ability. Speech is fluent without  dysarthria, dysphonia or aphasia. Mood and affect are appropriate.  Cranial nerves: Pupils are equal and briskly reactive to light. Funduscopic exam without   evidence of pallor or edema. Extraocular movements  in vertical and horizontal planes intact and without nystagmus. Visual fields by finger perimetry are intact. Hearing to finger rub intact.  Facial sensation intact to fine touch. Facial motor strength is symmetric and tongue and uvula move midline.  Motor  exam:   Normal tone and  muscle bulk and symmetric strength in all extremities.  Sensory:  Fine touch, pinprick and vibration were decreased over both feet up to the ankle level. Proprioception is affected.  Gait and station: Patient walks without assistive device , . Strength within normal limits. Stance is stable and normal.   Deep tendon reflexes: in the  upper and lower extremities are attenuated in the lower extremities and present 2 plus in the upper extremities.  Babinski maneuver response is absent .   Assessment:  After physical and neurologic examination, review of laboratory studies, imaging, neurophysiology testing and pre-existing records, assessment :  OSA with hypoxemia overlap syndrome COPD, lung cancer.  Well treated and responding to CPAP at current level , Oxygen supplementation was needed.  Diabetes with peripheral neuropathy , and sensory loss, ataxia.    Plan:  Treatment plan and additional workup: continue CPAP and yearly RV.

## 2014-01-13 ENCOUNTER — Ambulatory Visit: Payer: Medicare Other | Admitting: Emergency Medicine

## 2014-01-19 ENCOUNTER — Ambulatory Visit (HOSPITAL_BASED_OUTPATIENT_CLINIC_OR_DEPARTMENT_OTHER): Payer: Medicare Other

## 2014-01-19 VITALS — BP 99/59 | HR 80 | Temp 96.6°F | Resp 18

## 2014-01-19 DIAGNOSIS — C3491 Malignant neoplasm of unspecified part of right bronchus or lung: Secondary | ICD-10-CM

## 2014-01-19 DIAGNOSIS — C3490 Malignant neoplasm of unspecified part of unspecified bronchus or lung: Secondary | ICD-10-CM

## 2014-01-19 MED ORDER — HEPARIN SOD (PORK) LOCK FLUSH 100 UNIT/ML IV SOLN
500.0000 [IU] | Freq: Once | INTRAVENOUS | Status: AC | PRN
  Administered 2014-01-19: 500 [IU] via INTRAVENOUS
  Filled 2014-01-19: qty 5

## 2014-01-19 MED ORDER — ALTEPLASE 2 MG IJ SOLR
2.0000 mg | Freq: Once | INTRAMUSCULAR | Status: DC | PRN
  Filled 2014-01-19: qty 2

## 2014-01-19 MED ORDER — SODIUM CHLORIDE 0.9 % IJ SOLN
10.0000 mL | INTRAMUSCULAR | Status: DC | PRN
  Administered 2014-01-19: 10 mL via INTRAVENOUS
  Filled 2014-01-19: qty 10

## 2014-01-19 NOTE — Progress Notes (Signed)
[name] presented for Portacath access and flush. Proper placement of portacath confirmed by CXR. Portacath located in the left chest wall accessed with  H 20 needle. Clean, Dry and Intact Good blood return present. Portacath flushed with 4ml NS and 500U/49ml Heparin per protocol and needle removed intact. Procedure without incident. Patient tolerated procedure well.

## 2014-01-19 NOTE — Patient Instructions (Signed)

## 2014-02-02 ENCOUNTER — Ambulatory Visit (INDEPENDENT_AMBULATORY_CARE_PROVIDER_SITE_OTHER): Payer: Medicare Other | Admitting: Emergency Medicine

## 2014-02-02 ENCOUNTER — Other Ambulatory Visit: Payer: Self-pay | Admitting: Hematology & Oncology

## 2014-02-02 ENCOUNTER — Encounter: Payer: Self-pay | Admitting: Emergency Medicine

## 2014-02-02 VITALS — BP 124/66 | HR 63 | Ht 70.0 in | Wt 236.0 lb

## 2014-02-02 DIAGNOSIS — J449 Chronic obstructive pulmonary disease, unspecified: Secondary | ICD-10-CM

## 2014-02-02 DIAGNOSIS — Z9989 Dependence on other enabling machines and devices: Secondary | ICD-10-CM

## 2014-02-02 DIAGNOSIS — J4489 Other specified chronic obstructive pulmonary disease: Secondary | ICD-10-CM

## 2014-02-02 DIAGNOSIS — R0902 Hypoxemia: Secondary | ICD-10-CM

## 2014-02-02 DIAGNOSIS — G4733 Obstructive sleep apnea (adult) (pediatric): Secondary | ICD-10-CM

## 2014-02-02 NOTE — Progress Notes (Signed)
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 Was admitted 5/31 with acute dyspnea, found to be in A fib + RVR. Started on amiodarone. Converted to NSR 11/11/12. Scheduled for repeat Ct scan chest (Dr Katheran Awe) for surveillance on 11/25/12. During his hospitalization, we started the eval for his presumed COPD.  He has been on Spiriva before, SABA before, didn't notice any benefit.   ROV 02/03/13 -- Hx COPD, OSA on CPAP, adenoCA. Last time we tried anoro to see if she would benefit. He isn't sure that the medication is doing anything. He does feel stronger and has little cough or wheeze. He is wearing o2 on 2L/min at all times. Wears CPAP reliably.   ROV 08/25/13 -- follows up for COPD, OSA w good CPAP compliance, hx RML adenoCA (stable CT scan 12/14), DVT, HTN. We tried to get him a portable concentrator, but it was larger than he thought and was too bulky. He is back on O2 tanks 2L/min. He has some exertional SOB especially w stairs. He does not believe that Anoro helped, not interested in an alternative inhaled right now. No flares, no AE's, no pred/abx.   ROV 02/02/14 -- follows for COPD, OSA on CPAP, hx adenoCA RML. He is due for a CT scan in 03/2014. He has not really benefited from scheduled BD's. He uses ProAir rarely for wheeze and in prep for exertion.    Filed Vitals:   02/02/14 1222  BP: 124/66  Pulse: 63  Height: 5\' 10"  (1.778 m)  Weight: 236 lb (107.049 kg)  SpO2: 92%   Gen: Pleasant, well-nourished, in no distress,  normal affect  ENT: No lesions,  mouth clear,  oropharynx clear, no postnasal drip  Neck: No JVD, no TMG, no carotid bruits  Lungs: No use of accessory muscles, no dullness to percussion, clear without rales or rhonchi  Cardiovascular: RRR, heart sounds normal, no murmur or gallops, no peripheral edema  Musculoskeletal: No deformities, no cyanosis or clubbing  Neuro:  alert, non focal  Skin: Warm, no lesions or rashes   05/13/13 --  COMPARISON: 11/25/2012  FINDINGS:  11 mm right paratracheal lymph remains stable. No other  pathologically enlarged nodes visualized within the thorax. A tiny  right pleural effusions decreased in size since previous study. No  evidence of left-sided pleural effusion. Mild emphysema and  bilateral scarring remains stable. No suspicious pulmonary nodules  or masses are seen. No evidence of acute infiltrate or central  endobronchial lesion.  No evidence of chest wall mass or suspicious bone lesions. Both  adrenal glands remain normal in appearance.  IMPRESSION:  Stable 11 mm right paratracheal lymph node. No evidence of new or  progressive disease within the thorax.  Tiny right pleural effusion has decreased in size.    COPD (chronic obstructive pulmonary disease) Please continue your oxygen set on 3L/min at all times.  Wear your CPAP every night Continue to have your ProAir available to use 2 puffs as needed.  Follow with Dr Lamonte Sakai in 6 months or sooner if you have any problems  OSA on CPAP CPAP qhs  Hypoxemia Discussed compliance

## 2014-02-02 NOTE — Assessment & Plan Note (Signed)
CPAP qhs

## 2014-02-02 NOTE — Patient Instructions (Signed)
Please continue your oxygen set on 3L/min at all times.  Wear your CPAP every night Continue to have your ProAir available to use 2 puffs as needed.  Follow with Dr Lamonte Sakai in 6 months or sooner if you have any problems

## 2014-02-02 NOTE — Assessment & Plan Note (Signed)
Please continue your oxygen set on 3L/min at all times.  Wear your CPAP every night Continue to have your ProAir available to use 2 puffs as needed.  Follow with Dr Lamonte Sakai in 6 months or sooner if you have any problems

## 2014-02-02 NOTE — Assessment & Plan Note (Signed)
Discussed compliance

## 2014-03-08 ENCOUNTER — Other Ambulatory Visit: Payer: Self-pay | Admitting: *Deleted

## 2014-03-08 DIAGNOSIS — C3491 Malignant neoplasm of unspecified part of right bronchus or lung: Secondary | ICD-10-CM

## 2014-03-16 ENCOUNTER — Ambulatory Visit (HOSPITAL_BASED_OUTPATIENT_CLINIC_OR_DEPARTMENT_OTHER): Payer: Medicare Other | Admitting: Family

## 2014-03-16 ENCOUNTER — Ambulatory Visit (HOSPITAL_BASED_OUTPATIENT_CLINIC_OR_DEPARTMENT_OTHER): Payer: Medicare Other

## 2014-03-16 ENCOUNTER — Ambulatory Visit (HOSPITAL_BASED_OUTPATIENT_CLINIC_OR_DEPARTMENT_OTHER)
Admission: RE | Admit: 2014-03-16 | Discharge: 2014-03-16 | Disposition: A | Discharge status: Home / Self Care | Payer: Medicare Other | Source: Ambulatory Visit | Attending: Hematology & Oncology | Admitting: Hematology & Oncology

## 2014-03-16 ENCOUNTER — Other Ambulatory Visit (HOSPITAL_BASED_OUTPATIENT_CLINIC_OR_DEPARTMENT_OTHER): Payer: Medicare Other | Admitting: Lab

## 2014-03-16 DIAGNOSIS — R0902 Hypoxemia: Secondary | ICD-10-CM | POA: Diagnosis not present

## 2014-03-16 DIAGNOSIS — C3490 Malignant neoplasm of unspecified part of unspecified bronchus or lung: Secondary | ICD-10-CM | POA: Insufficient documentation

## 2014-03-16 DIAGNOSIS — D381 Neoplasm of uncertain behavior of trachea, bronchus and lung: Secondary | ICD-10-CM

## 2014-03-16 DIAGNOSIS — I4891 Unspecified atrial fibrillation: Secondary | ICD-10-CM

## 2014-03-16 DIAGNOSIS — C3491 Malignant neoplasm of unspecified part of right bronchus or lung: Secondary | ICD-10-CM

## 2014-03-16 DIAGNOSIS — Z23 Encounter for immunization: Secondary | ICD-10-CM

## 2014-03-16 LAB — CBC WITH DIFFERENTIAL (CANCER CENTER ONLY)
BASO#: 0 10*3/uL (ref 0.0–0.2)
BASO%: 0.2 % (ref 0.0–2.0)
EOS%: 2.6 % (ref 0.0–7.0)
Eosinophils Absolute: 0.2 10*3/uL (ref 0.0–0.5)
HCT: 40.4 % (ref 38.7–49.9)
HEMOGLOBIN: 13.6 g/dL (ref 13.0–17.1)
LYMPH#: 1.1 10*3/uL (ref 0.9–3.3)
LYMPH%: 13.9 % — ABNORMAL LOW (ref 14.0–48.0)
MCH: 31.1 pg (ref 28.0–33.4)
MCHC: 33.7 g/dL (ref 32.0–35.9)
MCV: 92 fL (ref 82–98)
MONO#: 0.7 10*3/uL (ref 0.1–0.9)
MONO%: 9 % (ref 0.0–13.0)
NEUT%: 74.3 % (ref 40.0–80.0)
NEUTROS ABS: 6 10*3/uL (ref 1.5–6.5)
Platelets: 201 10*3/uL (ref 145–400)
RBC: 4.38 10*6/uL (ref 4.20–5.70)
RDW: 14.2 % (ref 11.1–15.7)
WBC: 8 10*3/uL (ref 4.0–10.0)

## 2014-03-16 LAB — CMP (CANCER CENTER ONLY)
ALBUMIN: 3.3 g/dL (ref 3.3–5.5)
ALK PHOS: 82 U/L (ref 26–84)
ALT: 33 U/L (ref 10–47)
AST: 30 U/L (ref 11–38)
BUN, Bld: 19 mg/dL (ref 7–22)
CALCIUM: 9.4 mg/dL (ref 8.0–10.3)
CHLORIDE: 103 meq/L (ref 98–108)
CO2: 27 mEq/L (ref 18–33)
Creat: 1.3 mg/dl — ABNORMAL HIGH (ref 0.6–1.2)
Glucose, Bld: 139 mg/dL — ABNORMAL HIGH (ref 73–118)
POTASSIUM: 4.3 meq/L (ref 3.3–4.7)
SODIUM: 141 meq/L (ref 128–145)
TOTAL PROTEIN: 6.7 g/dL (ref 6.4–8.1)
Total Bilirubin: 0.5 mg/dl (ref 0.20–1.60)

## 2014-03-16 LAB — T4: T4 TOTAL: 7.7 ug/dL (ref 4.5–12.0)

## 2014-03-16 MED ORDER — HEPARIN SOD (PORK) LOCK FLUSH 100 UNIT/ML IV SOLN
500.0000 [IU] | INTRAVENOUS | Status: AC | PRN
  Administered 2014-03-16: 500 [IU]
  Filled 2014-03-16: qty 5

## 2014-03-16 MED ORDER — SODIUM CHLORIDE 0.9 % IJ SOLN
10.0000 mL | INTRAMUSCULAR | Status: AC | PRN
  Administered 2014-03-16: 10 mL
  Filled 2014-03-16: qty 10

## 2014-03-16 MED ORDER — INFLUENZA VAC SPLIT QUAD 0.5 ML IM SUSY
0.5000 mL | PREFILLED_SYRINGE | Freq: Once | INTRAMUSCULAR | Status: AC
  Administered 2014-03-16: 0.5 mL via INTRAMUSCULAR
  Filled 2014-03-16: qty 0.5

## 2014-03-16 NOTE — Patient Instructions (Signed)

## 2014-03-16 NOTE — Progress Notes (Signed)
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  Paroxysmal atrial fibrillation.  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  INTERVAL HISTORY: [name] is here today for a follow-up. He seems to be doing well. He is wearing his O2 85-90% of the time. He wears a CPAP machine while seeing. His SOB is the same. He denies fever, chills, n/v, cough, rash, headache, dizziness, chest pain, palpitations, abdominal pain, constipation, diarrhea, blood in urine or stool. No swelling, tenderness, numbness or tingling in his extremities. His appetite is good and he drinks plenty of fluids. No bleeding on the Plavix. No bony pain. His chest xray today showed no mass or adenopathy.  CURRENT TREATMENT: Eliquis 5 mg p.o. b.i.d.  REVIEW OF SYSTEMS: All other 10 point review of systems is negative.   PAST MEDICAL HISTORY: Past Medical History  Diagnosis Date  . DVT (deep venous thrombosis)     IN RIGHT ARM 11/2010   . BPH (benign prostatic hyperplasia)   . COPD (chronic obstructive pulmonary disease)        . Hyperlipidemia   . Allergic rhinitis   . Allergic conjunctivitis   . DJD (degenerative joint disease)     Left Knee  . Pneumonia     history  . Diverticulosis     hx  . Cellulitis     hx  . Hearing loss   . Cataract   . Diabetic peripheral neuropathy   . Hypertension   . Obstructive sleep apnea (adult)  10/29/2012    This patient has mild obstructive sleep apnea, tested in March 2014 at [facility]. He had been a CPAP user for 14 years. AHi 10.7 He was titrated to a pressure of 9 cm water( after auto -  titration). CPAP at night  --- 8-10 YR AGO....,OSA-diagnosed 2000  . Secondary diabetes with peripheral neuropathy 11/07/2012  . Acute diastolic CHF (congestive heart failure)   . Atrial fibrillation   . Lung cancer      history of stage IIIA non-small cell lung cancer who is currently being treated with both  Radiation and Chemotherapy   PAST SURGICAL HISTORY: Past Surgical History  Procedure Laterality Date  . Insertion of a left subclavian port-a-cath.  12/17/10    [place]  . Portacath placement  04/23/2011    Procedure: INSERTION PORT-A-CATH;  Surgeon: Pierre Bali, MD;  Location: [place];  Service: Thoracic;;  Revision of  Porta-Cath  . Fiberoptic bronchoscopy  12/11/2010  . Video bronchoscopy  09/27/2011    Procedure: VIDEO BRONCHOSCOPY;  Surgeon: Nicanor Alcon, MD;  Location: [place];  Service: Thoracic;  Laterality: N/A;  . Deviated septum repair    . Cardiac catheterization  2006   FAMILY HISTORY Family History  Problem Relation Age of Onset  . Coronary artery disease    . Cancer    . Multiple sclerosis    . Diabetes type II    . Anesthesia problems Neg Hx   . Hypotension Neg Hx   . Malignant hyperthermia Neg Hx   . Pseudochol deficiency Neg Hx    GYNECOLOGIC HISTORY:  No LMP for male patient.   SOCIAL HISTORY:  History   Social History  . Marital Status: Widowed    Spouse Name: N/A    Number of Children: 74  . Years of Education: 8   Occupational History  . farmer     retired  .     Social History Main Topics  . Smoking status: Former Smoker -- 1.00 packs/day for 50 years    Types: Cigarettes    Start date: 05/18/1943    Quit date: 06/10/1992  . Smokeless tobacco: Never Used     Comment: quit tobacco 3 years ago  . Alcohol Use: No  . Drug Use: No  . Sexual Activity: Not on file   Other Topics Concern  . Not on file   Social History Narrative   Patient is widowed and lives alone.   Patient is retired.   Patient has five adult children.   Patient  has a 8th grade education.   Patient is right-handed.   Patient does not drink caffeine.   ADVANCED DIRECTIVES: <no information>  HEALTH MAINTENANCE: History  Substance Use Topics  . Smoking status: Former Smoker -- 1.00 packs/day for 50 years    Types: Cigarettes    Start date: 05/18/1943    Quit date: 06/10/1992  . Smokeless tobacco: Never Used     Comment: quit tobacco 3 years ago  . Alcohol Use: No   Colonoscopy: PAP: Bone density: Lipid panel:  No Known Allergies  Current Outpatient Prescriptions  Medication Sig Dispense Refill  . albuterol (PROAIR HFA) 108 (90 BASE) MCG/ACT inhaler Inhale 2 puffs into the lungs every 6 (six) hours as needed for wheezing or shortness of breath.  1 Inhaler  3  . amiodarone (PACERONE) 200 MG tablet Take 200 mg by mouth daily.      Marland Kitchen apixaban (ELIQUIS) 5 MG TABS tablet Take 1 tablet (5 mg total) by mouth 2 (two) times daily.  60 tablet  12  . atorvastatin (LIPITOR) 20 MG tablet Take 1 tablet (20 mg total) by mouth daily.  30 tablet  12  . cetirizine (ZYRTEC) 10 MG tablet Take 10 mg by mouth every morning.       . diltiazem (CARDIZEM CD) 240 MG 24 hr capsule Take 1 capsule (240 mg total) by mouth daily.  30 capsule  12  . Exenatide ER (BYDUREON) 2 MG PEN Inject into the skin. One injection weekly      . furosemide (LASIX) 40 MG tablet Take 1 tablet (40 mg total) by mouth daily.  30 tablet  12  . glucosamine-chondroitin 500-400 MG tablet Take 1 tablet by mouth 2 (two) times daily.       . insulin detemir (LEVEMIR) 100 UNIT/ML injection Inject into the skin 2 (two) times daily. 16 U. IN AM .AND 12 U. IN PM      . metFORMIN (GLUCOPHAGE) 500 MG tablet Take 500 mg by mouth 2 (two) times daily.      . Psyllium (METAMUCIL PO) Take 2 capsules by mouth daily as needed. For constipation      . temazepam (RESTORIL) 15 MG capsule TAKE ONE CAPSULE BY MOUTH AT BEDTIME AS NEEDED FOR SLEEP  30 capsule  0   No current facility-administered medications for  this visit.   Facility-Administered Medications Ordered in Other Visits  Medication Dose Route Frequency Provider Last Rate Last Dose  . sodium chloride 0.9 % injection 10 mL  10 mL Intravenous PRN Volanda Napoleon, MD   10 mL at 09/11/12 8413   OBJECTIVE:  There were no vitals filed for this visit. There is no weight on file to calculate BMI. ECOG FS:0 - Asymptomatic Ocular: Sclerae unicteric, pupils equal, round and reactive to light Ear-nose-throat: Oropharynx clear, dentition fair Lymphatic: No cervical or supraclavicular adenopathy Lungs no rales or rhonchi, good excursion bilaterally Heart regular rate and rhythm, no murmur appreciated Abd soft, nontender, positive bowel sounds MSK no focal spinal tenderness, no joint edema Neuro: non-focal, well-oriented, appropriate affect  LAB RESULTS: CMP     Component Value Date/Time   NA 141 03/16/2014 1411   NA 136 02/17/2013 1208   K 4.3 03/16/2014 1411   K 3.9 02/17/2013 1208   CL 103 03/16/2014 1411   CL 100 02/17/2013 1208   CO2 27 03/16/2014 1411   CO2 28 02/17/2013 1208   GLUCOSE 139* 03/16/2014 1411   GLUCOSE 215* 02/17/2013 1208   BUN 19 03/16/2014 1411   BUN 25* 02/17/2013 1208   CREATININE 1.3* 03/16/2014 1411   CREATININE 1.48* 02/17/2013 1208   CALCIUM 9.4 03/16/2014 1411   CALCIUM 8.8 02/17/2013 1208   PROT 6.7 03/16/2014 1411   PROT 6.8 02/17/2013 1208   ALBUMIN 4.0 02/17/2013 1208   AST 30 03/16/2014 1411   AST 19 02/17/2013 1208   ALT 33 03/16/2014 1411   ALT 19 02/17/2013 1208   ALKPHOS 82 03/16/2014 1411   ALKPHOS 79 02/17/2013 1208   BILITOT 0.50 03/16/2014 1411   BILITOT 0.6 02/17/2013 1208   GFRNONAA 55* 11/12/2012 0519   GFRAA 64* 11/12/2012 0519   No results found for this basename: SPEP, UPEP,  kappa and lambda light chains   Lab Results  Component Value Date   WBC 8.0 03/16/2014   NEUTROABS 6.0 03/16/2014   HGB 13.6 03/16/2014   HCT 40.4 03/16/2014   MCV 92 03/16/2014   PLT 201 03/16/2014   No results found for this  basename: LABCA2   No components found with this basename: [id number]   No results found for this basename: INR,  in the last 168 hours  STUDIES: Dg Chest 2 View  03/16/2014   CLINICAL DATA:  Personal history of lung carcinoma. Chronic decreased oxygen saturations  EXAM: CHEST  2 VIEW  COMPARISON:  September 15, 2013  FINDINGS: There is evidence of a degree of underlying emphysematous change. There is mild scarring in the lateral lung bases bilaterally. There is no demonstrable edema or consolidation. The heart is upper normal in size with pulmonary vascularity within normal limits. No mass or adenopathy is appreciable. Port-A-Cath tip is in the superior vena cava. No pneumothorax. There is degenerative change in the thoracic spine. There are no blastic or lytic bone lesions.  IMPRESSION: Underlying emphysematous change with slight scarring bilaterally. No mass or adenopathy appreciable. No edema or consolidation. No pneumothorax.   Electronically Signed   By: Lowella Grip M.D.   On: 03/16/2014 14:10   ASSESSMENT/PLAN: [name] is 80 year old gentleman with a history of locally advanced-stage IIIB-adenocarcinoma of the right lung. He had contralateral mediastinal node involvement. He was treated with radiation and chemotherapy which he completed in December of 2012. He had a very nice response. So far he has shown no evidence of recurrence.  He is doing well with Plavix for afib. No bleeding.  His port a cath looks good. It was flushed today.  Wewill continue to flush his port every 6-8 weeks.  We will see him back in 6 months for labs and follow-up.  He knows to call here with  any questions or concerns and to go to the ED in the event of an emergency. We can certainly see him back sooner if need be.   Eliezer Bottom, NP 03/16/2014 3:24 PM

## 2014-03-17 LAB — TSH CHCC: TSH: 3.298 m[IU]/L (ref 0.320–4.118)

## 2014-04-02 ENCOUNTER — Other Ambulatory Visit: Payer: Self-pay | Admitting: Nurse Practitioner

## 2014-05-04 ENCOUNTER — Ambulatory Visit (HOSPITAL_BASED_OUTPATIENT_CLINIC_OR_DEPARTMENT_OTHER): Payer: Medicare Other

## 2014-05-04 VITALS — BP 109/55 | HR 65 | Temp 97.8°F | Resp 22

## 2014-05-04 DIAGNOSIS — Z452 Encounter for adjustment and management of vascular access device: Secondary | ICD-10-CM

## 2014-05-04 DIAGNOSIS — C3491 Malignant neoplasm of unspecified part of right bronchus or lung: Secondary | ICD-10-CM

## 2014-05-04 MED ORDER — HEPARIN SOD (PORK) LOCK FLUSH 100 UNIT/ML IV SOLN
500.0000 [IU] | Freq: Once | INTRAVENOUS | Status: AC
  Administered 2014-05-04: 500 [IU] via INTRAVENOUS
  Filled 2014-05-04: qty 5

## 2014-05-04 MED ORDER — SODIUM CHLORIDE 0.9 % IJ SOLN
10.0000 mL | INTRAMUSCULAR | Status: DC | PRN
  Administered 2014-05-04: 10 mL via INTRAVENOUS
  Filled 2014-05-04: qty 10

## 2014-05-04 NOTE — Patient Instructions (Signed)

## 2014-05-17 ENCOUNTER — Ambulatory Visit
Admission: RE | Admit: 2014-05-17 | Discharge: 2014-05-17 | Disposition: A | Discharge status: Home / Self Care | Payer: Medicare Other | Source: Ambulatory Visit | Attending: Cardiology | Admitting: Cardiology

## 2014-05-17 ENCOUNTER — Other Ambulatory Visit: Payer: Self-pay | Admitting: Cardiology

## 2014-05-17 DIAGNOSIS — R0602 Shortness of breath: Secondary | ICD-10-CM

## 2014-06-29 ENCOUNTER — Ambulatory Visit (HOSPITAL_BASED_OUTPATIENT_CLINIC_OR_DEPARTMENT_OTHER): Payer: Medicare Other

## 2014-06-29 VITALS — BP 99/60 | HR 57 | Temp 97.5°F | Resp 20

## 2014-06-29 DIAGNOSIS — Z452 Encounter for adjustment and management of vascular access device: Secondary | ICD-10-CM

## 2014-06-29 DIAGNOSIS — R0902 Hypoxemia: Secondary | ICD-10-CM

## 2014-06-29 DIAGNOSIS — C3491 Malignant neoplasm of unspecified part of right bronchus or lung: Secondary | ICD-10-CM

## 2014-06-29 MED ORDER — SODIUM CHLORIDE 0.9 % IJ SOLN
10.0000 mL | INTRAMUSCULAR | Status: DC | PRN
  Administered 2014-06-29: 10 mL via INTRAVENOUS
  Filled 2014-06-29: qty 10

## 2014-06-29 MED ORDER — HEPARIN SOD (PORK) LOCK FLUSH 100 UNIT/ML IV SOLN
500.0000 [IU] | Freq: Once | INTRAVENOUS | Status: AC
  Administered 2014-06-29: 500 [IU] via INTRAVENOUS
  Filled 2014-06-29: qty 5

## 2014-06-29 NOTE — Patient Instructions (Signed)
Implanted Port Insertion, Care After Refer to this sheet in the next few weeks. These instructions provide you with information on caring for yourself after your procedure. Your health care provider may also give you more specific instructions. Your treatment has been planned according to current medical practices, but problems sometimes occur. Call your health care provider if you have any problems or questions after your procedure. WHAT TO EXPECT AFTER THE PROCEDURE After your procedure, it is typical to have the following:   Discomfort at the port insertion site. Ice packs to the area will help.  Bruising on the skin over the port. This will subside in 3-4 days. HOME CARE INSTRUCTIONS  After your port is placed, you will get a manufacturer's information card. The card has information about your port. Keep this card with you at all times.   Know what kind of port you have. There are many types of ports available.   Wear a medical alert bracelet in case of an emergency. This can help alert health care workers that you have a port.   The port can stay in for as long as your health care provider believes it is necessary.   A home health care nurse may give medicines and take care of the port.   You or a family member can get special training and directions for giving medicine and taking care of the port at home.  SEEK MEDICAL CARE IF:   Your port does not flush or you are unable to get a blood return.   You have a fever or chills. SEEK IMMEDIATE MEDICAL CARE IF:  You have new fluid or pus coming from your incision.   You notice a bad smell coming from your incision site.   You have swelling, pain, or more redness at the incision or port site.   You have chest pain or shortness of breath. Document Released: 03/17/2013 Document Revised: 06/01/2013 Document Reviewed: 03/17/2013 Exit[employer] Patient Information 2015 [employer]. This information is not intended to replace  advice given to you by your health care provider. Make sure you discuss any questions you have with your health care provider.  

## 2014-08-12 ENCOUNTER — Ambulatory Visit (INDEPENDENT_AMBULATORY_CARE_PROVIDER_SITE_OTHER): Payer: Medicare Other | Admitting: Emergency Medicine

## 2014-08-12 ENCOUNTER — Encounter: Payer: Self-pay | Admitting: Emergency Medicine

## 2014-08-12 VITALS — BP 110/70 | HR 77 | Ht 71.0 in | Wt 233.0 lb

## 2014-08-12 DIAGNOSIS — Z9989 Dependence on other enabling machines and devices: Principal | ICD-10-CM

## 2014-08-12 DIAGNOSIS — J449 Chronic obstructive pulmonary disease, unspecified: Secondary | ICD-10-CM

## 2014-08-12 DIAGNOSIS — G4733 Obstructive sleep apnea (adult) (pediatric): Secondary | ICD-10-CM

## 2014-08-12 DIAGNOSIS — C3491 Malignant neoplasm of unspecified part of right bronchus or lung: Secondary | ICD-10-CM

## 2014-08-12 NOTE — Assessment & Plan Note (Signed)
10 use CPAP every night

## 2014-08-12 NOTE — Assessment & Plan Note (Signed)
Currently being followed with serial chest x-rays, no evidence of disease reactivation

## 2014-08-12 NOTE — Progress Notes (Signed)
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 Was admitted 5/31 with acute dyspnea, found to be in A fib + RVR. Started on amiodarone. Converted to NSR 11/11/12. Scheduled for repeat Ct scan chest (Dr Katheran Awe) for surveillance on 11/25/12. During his hospitalization, we started the eval for his presumed COPD.  He has been on Spiriva before, SABA before, didn't notice any benefit.   ROV 02/03/13 -- Hx COPD, OSA on CPAP, adenoCA. Last time we tried anoro to see if she would benefit. He isn't sure that the medication is doing anything. He does feel stronger and has little cough or wheeze. He is wearing o2 on 2L/min at all times. Wears CPAP reliably.   ROV 08/25/13 -- follows up for COPD, OSA w good CPAP compliance, hx RML adenoCA (stable CT scan 12/14), DVT, HTN. We tried to get him a portable concentrator, but it was larger than he thought and was too bulky. He is back on O2 tanks 2L/min. He has some exertional SOB especially w stairs. He does not believe that Anoro helped, not interested in an alternative inhaled right now. No flares, no AE's, no pred/abx.   ROV 02/02/14 -- follows for COPD, OSA on CPAP, hx adenoCA RML. He is due for a CT scan in 03/2014. He has not really benefited from scheduled BD's. He uses ProAir rarely for wheeze and in prep for exertion.   ROV 08/12/14 -- follow up visit for his COPD, OSA on CPAP, RML adenoCA. He is still having the same stable dyspnea, limits his activity. No AE COPD but he has had flares of A fib that has lead to some volume overload. He sees Dr Wynonia Lawman for this, has had his diuretics adjusted. He is not on any BD's because they have never seemed to help.  He is getting serial CXR's w Dr Katheran Awe.    Filed Vitals:   08/12/14 1435  BP: 110/70  Pulse: 77  Height: 5\' 11"  (1.803 m)  Weight: 233 lb (105.688 kg)  SpO2: 90%   Gen: Pleasant, well-nourished, in no distress,   normal affect  ENT: No lesions,  mouth clear,  oropharynx clear, no postnasal drip  Neck: No JVD, no TMG, no carotid bruits  Lungs: No use of accessory muscles, no dullness to percussion, clear without rales or rhonchi  Cardiovascular: RRR, heart sounds normal, no murmur or gallops, no peripheral edema  Musculoskeletal: No deformities, no cyanosis or clubbing  Neuro: alert, non focal  Skin: Warm, no lesions or rashes   05/13/13 --  COMPARISON: 11/25/2012  FINDINGS:  11 mm right paratracheal lymph remains stable. No other  pathologically enlarged nodes visualized within the thorax. A tiny  right pleural effusions decreased in size since previous study. No  evidence of left-sided pleural effusion. Mild emphysema and  bilateral scarring remains stable. No suspicious pulmonary nodules  or masses are seen. No evidence of acute infiltrate or central  endobronchial lesion.  No evidence of chest wall mass or suspicious bone lesions. Both  adrenal glands remain normal in appearance.  IMPRESSION:  Stable 11 mm right paratracheal lymph node. No evidence of new or  progressive disease within the thorax.  Tiny right pleural effusion has decreased in size.    OSA on CPAP 10 use CPAP every night   Lung cancer Currently being followed with serial chest x-rays, no evidence of disease reactivation   COPD (  chronic obstructive pulmonary disease) Not on scheduled bronchodilators as he has never seemed to benefit in the past when we started these. He will continue to need to have albuterol available to use as needed. We will reassess if his symptoms change. I would start Spiriva if he begins to show more signs of dyspnea

## 2014-08-12 NOTE — Assessment & Plan Note (Signed)
Not on scheduled bronchodilators as he has never seemed to benefit in the past when we started these. He will continue to need to have albuterol available to use as needed. We will reassess if his symptoms change. I would start Spiriva if he begins to show more signs of dyspnea

## 2014-08-12 NOTE — Patient Instructions (Signed)
Please continue to use your oxygen at all times Wear  CPAP every night Keep albuterol available to use if needed for shortness of breath Will not start any scheduled inhaled medicines at this time Follow with Dr Lamonte Sakai in 12 months or sooner if you have any problems

## 2014-08-24 ENCOUNTER — Ambulatory Visit (HOSPITAL_BASED_OUTPATIENT_CLINIC_OR_DEPARTMENT_OTHER): Payer: Medicare Other | Admitting: Hematology & Oncology

## 2014-08-24 ENCOUNTER — Ambulatory Visit: Payer: Medicare Other

## 2014-08-24 ENCOUNTER — Encounter: Payer: Self-pay | Admitting: Hematology & Oncology

## 2014-08-24 ENCOUNTER — Other Ambulatory Visit (HOSPITAL_BASED_OUTPATIENT_CLINIC_OR_DEPARTMENT_OTHER): Payer: Medicare Other | Admitting: Lab

## 2014-08-24 VITALS — BP 117/56 | HR 59 | Temp 97.4°F | Resp 20 | Ht 70.0 in | Wt 233.0 lb

## 2014-08-24 DIAGNOSIS — I4891 Unspecified atrial fibrillation: Secondary | ICD-10-CM

## 2014-08-24 DIAGNOSIS — C3491 Malignant neoplasm of unspecified part of right bronchus or lung: Secondary | ICD-10-CM

## 2014-08-24 DIAGNOSIS — C3400 Malignant neoplasm of unspecified main bronchus: Secondary | ICD-10-CM

## 2014-08-24 DIAGNOSIS — L989 Disorder of the skin and subcutaneous tissue, unspecified: Secondary | ICD-10-CM

## 2014-08-24 DIAGNOSIS — E039 Hypothyroidism, unspecified: Secondary | ICD-10-CM

## 2014-08-24 DIAGNOSIS — C3412 Malignant neoplasm of upper lobe, left bronchus or lung: Secondary | ICD-10-CM

## 2014-08-24 LAB — COMPREHENSIVE METABOLIC PANEL
ALK PHOS: 89 U/L (ref 39–117)
ALT: 72 U/L — ABNORMAL HIGH (ref 0–53)
AST: 81 U/L — AB (ref 0–37)
Albumin: 3.6 g/dL (ref 3.5–5.2)
BUN: 22 mg/dL (ref 6–23)
CO2: 26 mEq/L (ref 19–32)
Calcium: 8.4 mg/dL (ref 8.4–10.5)
Chloride: 100 mEq/L (ref 96–112)
Creatinine, Ser: 1.5 mg/dL — ABNORMAL HIGH (ref 0.50–1.35)
Glucose, Bld: 171 mg/dL — ABNORMAL HIGH (ref 70–99)
Potassium: 4 mEq/L (ref 3.5–5.3)
SODIUM: 140 meq/L (ref 135–145)
TOTAL PROTEIN: 6.5 g/dL (ref 6.0–8.3)
Total Bilirubin: 0.5 mg/dL (ref 0.2–1.2)

## 2014-08-24 LAB — TSH CHCC: TSH: 5.296 m(IU)/L — ABNORMAL HIGH (ref 0.320–4.118)

## 2014-08-24 LAB — CBC WITH DIFFERENTIAL (CANCER CENTER ONLY)
BASO#: 0 10*3/uL (ref 0.0–0.2)
BASO%: 0.3 % (ref 0.0–2.0)
EOS%: 2.9 % (ref 0.0–7.0)
Eosinophils Absolute: 0.2 10*3/uL (ref 0.0–0.5)
HCT: 40.3 % (ref 38.7–49.9)
HGB: 13.1 g/dL (ref 13.0–17.1)
LYMPH#: 1.4 10*3/uL (ref 0.9–3.3)
LYMPH%: 19.6 % (ref 14.0–48.0)
MCH: 29 pg (ref 28.0–33.4)
MCHC: 32.5 g/dL (ref 32.0–35.9)
MCV: 89 fL (ref 82–98)
MONO#: 0.7 10*3/uL (ref 0.1–0.9)
MONO%: 9.4 % (ref 0.0–13.0)
NEUT%: 67.8 % (ref 40.0–80.0)
NEUTROS ABS: 4.7 10*3/uL (ref 1.5–6.5)
PLATELETS: 185 10*3/uL (ref 145–400)
RBC: 4.52 10*6/uL (ref 4.20–5.70)
RDW: 14.6 % (ref 11.1–15.7)
WBC: 6.9 10*3/uL (ref 4.0–10.0)

## 2014-08-24 LAB — T4, FREE: Free T4: 1.32 ng/dL (ref 0.80–1.80)

## 2014-08-24 MED ORDER — HEPARIN SOD (PORK) LOCK FLUSH 100 UNIT/ML IV SOLN
500.0000 [IU] | Freq: Once | INTRAVENOUS | Status: AC
  Administered 2014-08-24: 500 [IU] via INTRAVENOUS
  Filled 2014-08-24: qty 5

## 2014-08-24 MED ORDER — SODIUM CHLORIDE 0.9 % IJ SOLN
10.0000 mL | INTRAMUSCULAR | Status: DC | PRN
  Administered 2014-08-24: 10 mL via INTRAVENOUS
  Filled 2014-08-24: qty 10

## 2014-08-24 NOTE — Addendum Note (Signed)
Addended by: Volanda Napoleon on: 08/24/2014 06:18 PM   Modules accepted: Orders

## 2014-08-24 NOTE — Progress Notes (Signed)
Hematology and Oncology Follow Up Visit  [name] [id number] 03/29/1925 81 y.o. 08/24/2014   Principle Diagnosis:  . 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 2. Paroxysmal atrial fibrillation. 3. 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  Current Therapy:   Eliquis 5 mg p.o. b.i.d.     Interim History:  Mr.  [name] is back for followup. He still pretty well. He was out mowing his yard yesterday. Has a sunburn. He does have skin lesions that probably are pretty skin cancer or possibly even squamous cell cancer. He does see a dermatologist.  He's had supplemental oxygen that he takes. He does get short of breath. He does have atrial fibrillation. He is on medications for this.  His appetite has been pretty good. He's had no nausea vomiting. He's had no change in bowel or bladder habits.   Medications:  Current outpatient prescriptions:  .  albuterol (PROAIR HFA) 108 (90 BASE) MCG/ACT inhaler, Inhale 2 puffs into the lungs every 6 (six) hours as needed for wheezing or shortness of breath., Disp: 1 Inhaler, Rfl: 3 .  amiodarone (PACERONE) 200 MG tablet, Take 200 mg by mouth daily., Disp: , Rfl:  .  apixaban (ELIQUIS) 5 MG TABS tablet, Take 1 tablet (5 mg total) by mouth 2 (two) times daily., Disp: 60 tablet, Rfl: 12 .  atorvastatin (LIPITOR) 20 MG tablet, Take 1 tablet (20 mg total) by mouth daily., Disp: 30 tablet, Rfl: 12 .  cetirizine (ZYRTEC) 10 MG tablet, Take 10 mg by mouth every morning. , Disp: , Rfl:  .  diltiazem (CARDIZEM CD) 240 MG 24 hr capsule, Take 1 capsule (240 mg total) by mouth daily., Disp: 30 capsule, Rfl: 12 .  Exenatide ER (BYDUREON) 2 MG PEN, Inject into the skin. One injection weekly, Disp: , Rfl:  .  furosemide (LASIX) 40 MG tablet, Take 1 tablet (40 mg total) by mouth daily., Disp: 30 tablet, Rfl: 12 .  glucosamine-chondroitin 500-400 MG tablet, Take 1 tablet by mouth 2 (two) times daily. , Disp:  , Rfl:  .  insulin detemir (LEVEMIR) 100 UNIT/ML injection, Inject into the skin 2 (two) times daily. 16 U. IN AM .AND 12 U. IN PM, Disp: , Rfl:  .  metFORMIN (GLUCOPHAGE) 500 MG tablet, Take 500 mg by mouth 2 (two) times daily., Disp: , Rfl:  .  Psyllium (METAMUCIL PO), Take 2 capsules by mouth daily as needed. For constipation, Disp: , Rfl:  .  temazepam (RESTORIL) 15 MG capsule, TAKE ONE CAPSULE BY MOUTH AT BEDTIME AS NEEDED FOR SLEEP, Disp: 30 capsule, Rfl: 0  Allergies: No Known Allergies  Past Medical History, Surgical history, Social history, and Family History were reviewed and updated.  Review of Systems: As above  Physical Exam:  height is 5\' 10"  (1.778 m) and weight is 233 lb (105.688 kg). His oral temperature is 97.4 F (36.3 C). His blood pressure is 117/56 and his pulse is 59. His respiration is 20.   Very [name] gentleman. Head and neck exam shows no ocular or oral lesions. He has no adenopathy in the neck. Lungs are clear. Cardiac exam regular rate and rhythm this is with atrial fibrillation. He has a 1/6 murmur. Abdomen soft. Has good bowel sounds. There is no palpable abdominal mass. There is no fluid wave. Back exam no tenderness over the spine ribs or hips. Extremities shows some trace chronic edema in his legs.  Has good strength. Has good range of motion of his joints. Skin exam shows some actinic keratoses. Some ecchymoses are noted. No petechia. Neurological exam is negative. Lab Results  Component Value Date   WBC 6.9 08/24/2014   HGB 13.1 08/24/2014   HCT 40.3 08/24/2014   MCV 89 08/24/2014   PLT 185 08/24/2014     Chemistry      Component Value Date/Time   NA 141 03/16/2014 1411   NA 136 02/17/2013 1208   K 4.3 03/16/2014 1411   K 3.9 02/17/2013 1208   CL 103 03/16/2014 1411   CL 100 02/17/2013 1208   CO2 27 03/16/2014 1411   CO2 28 02/17/2013 1208   BUN 19 03/16/2014 1411   BUN 25* 02/17/2013 1208   CREATININE 1.3* 03/16/2014 1411   CREATININE 1.48*  02/17/2013 1208      Component Value Date/Time   CALCIUM 9.4 03/16/2014 1411   CALCIUM 8.8 02/17/2013 1208   ALKPHOS 82 03/16/2014 1411   ALKPHOS 79 02/17/2013 1208   AST 30 03/16/2014 1411   AST 19 02/17/2013 1208   ALT 33 03/16/2014 1411   ALT 19 02/17/2013 1208   BILITOT 0.50 03/16/2014 1411   BILITOT 0.6 02/17/2013 1208         Impression and Plan: [name] is 81 year old gentleman with a history of locally advanced-stage IIIB-adenocarcinoma of the right lung. He had contralateral mediastinal node involvement. He was treated with radiation chemotherapy. He had a very nice response. He completed  treatment in December of 2012. Thankfully, His disease has not come back. This I am surprised by because of the extensive nature of his locally advanced disease.  He has cardiac issues. He has pulmonary issues. These clearly are more important for his prognosis now.  I think we can get him back in 6 months.  He does have a Port-A-Cath in. I do think that the Port-A-Cath will be useful for him given his other health issues and the fact that he likely will be hospitalized at some point because of complications of his cardiopulmonary disease.  I spent a half hour with he and his daughter. I will try pray for him about this whole legal issue that is going on.  Patrick Smiles, MD 3/16/201610:53 AM

## 2014-08-25 ENCOUNTER — Other Ambulatory Visit: Payer: Self-pay | Admitting: *Deleted

## 2014-08-25 DIAGNOSIS — C3400 Malignant neoplasm of unspecified main bronchus: Secondary | ICD-10-CM

## 2014-10-19 ENCOUNTER — Other Ambulatory Visit (HOSPITAL_BASED_OUTPATIENT_CLINIC_OR_DEPARTMENT_OTHER): Payer: Medicare Other

## 2014-10-19 ENCOUNTER — Ambulatory Visit: Payer: Medicare Other

## 2014-10-19 DIAGNOSIS — C3491 Malignant neoplasm of unspecified part of right bronchus or lung: Secondary | ICD-10-CM

## 2014-10-19 DIAGNOSIS — E039 Hypothyroidism, unspecified: Secondary | ICD-10-CM

## 2014-10-19 DIAGNOSIS — C3400 Malignant neoplasm of unspecified main bronchus: Secondary | ICD-10-CM

## 2014-10-19 DIAGNOSIS — D381 Neoplasm of uncertain behavior of trachea, bronchus and lung: Secondary | ICD-10-CM

## 2014-10-19 DIAGNOSIS — C3412 Malignant neoplasm of upper lobe, left bronchus or lung: Secondary | ICD-10-CM

## 2014-10-19 LAB — CBC WITH DIFFERENTIAL (CANCER CENTER ONLY)
BASO#: 0 10*3/uL (ref 0.0–0.2)
BASO%: 0.3 % (ref 0.0–2.0)
EOS%: 1.9 % (ref 0.0–7.0)
Eosinophils Absolute: 0.1 10*3/uL (ref 0.0–0.5)
HEMATOCRIT: 38.9 % (ref 38.7–49.9)
HEMOGLOBIN: 12.9 g/dL — AB (ref 13.0–17.1)
LYMPH#: 1.4 10*3/uL (ref 0.9–3.3)
LYMPH%: 19.8 % (ref 14.0–48.0)
MCH: 28.9 pg (ref 28.0–33.4)
MCHC: 33.2 g/dL (ref 32.0–35.9)
MCV: 87 fL (ref 82–98)
MONO#: 0.7 10*3/uL (ref 0.1–0.9)
MONO%: 10.7 % (ref 0.0–13.0)
NEUT#: 4.6 10*3/uL (ref 1.5–6.5)
NEUT%: 67.3 % (ref 40.0–80.0)
Platelets: 185 10*3/uL (ref 145–400)
RBC: 4.47 10*6/uL (ref 4.20–5.70)
RDW: 14.9 % (ref 11.1–15.7)
WBC: 6.8 10*3/uL (ref 4.0–10.0)

## 2014-10-19 LAB — CMP (CANCER CENTER ONLY)
ALK PHOS: 80 U/L (ref 26–84)
ALT(SGPT): 67 U/L — ABNORMAL HIGH (ref 10–47)
AST: 76 U/L — AB (ref 11–38)
Albumin: 3.4 g/dL (ref 3.3–5.5)
BUN, Bld: 21 mg/dL (ref 7–22)
CO2: 33 mEq/L (ref 18–33)
Calcium: 9 mg/dL (ref 8.0–10.3)
Chloride: 100 mEq/L (ref 98–108)
Creat: 1.5 mg/dl — ABNORMAL HIGH (ref 0.6–1.2)
GLUCOSE: 99 mg/dL (ref 73–118)
Potassium: 3.5 mEq/L (ref 3.3–4.7)
SODIUM: 141 meq/L (ref 128–145)
TOTAL PROTEIN: 7.2 g/dL (ref 6.4–8.1)
Total Bilirubin: 0.7 mg/dl (ref 0.20–1.60)

## 2014-10-19 LAB — T4, FREE: Free T4: 1.15 ng/dL (ref 0.80–1.80)

## 2014-10-19 LAB — TSH CHCC: TSH: 5.556 m(IU)/L — ABNORMAL HIGH (ref 0.320–4.118)

## 2014-10-19 MED ORDER — SODIUM CHLORIDE 0.9 % IJ SOLN
10.0000 mL | INTRAMUSCULAR | Status: DC | PRN
  Administered 2014-10-19: 10 mL via INTRAVENOUS
  Filled 2014-10-19: qty 10

## 2014-10-19 MED ORDER — HEPARIN SOD (PORK) LOCK FLUSH 100 UNIT/ML IV SOLN
500.0000 [IU] | Freq: Once | INTRAVENOUS | Status: AC
  Administered 2014-10-19: 500 [IU] via INTRAVENOUS
  Filled 2014-10-19: qty 5

## 2014-10-19 NOTE — Patient Instructions (Signed)

## 2014-10-24 ENCOUNTER — Other Ambulatory Visit: Payer: Self-pay | Admitting: Emergency Medicine

## 2014-10-26 ENCOUNTER — Telehealth: Payer: Self-pay

## 2014-10-26 NOTE — Telephone Encounter (Addendum)
-----   Message from Volanda Napoleon, MD sent at 10/26/2014  9:21 AM EDT ----- Please call and tell him that the labs looked okay. Thyroid levels look alright. Thanks  Message left on generic VM to contact our office for lab results. dph

## 2014-12-06 ENCOUNTER — Encounter: Payer: Self-pay | Admitting: Cardiology

## 2014-12-06 DIAGNOSIS — I480 Paroxysmal atrial fibrillation: Secondary | ICD-10-CM

## 2014-12-06 NOTE — Progress Notes (Signed)
Patient ID: [name], male   DOB: 1925/06/10, 81 y.o.   MRN: [id number]  [name]  Date of visit:  12/06/2014 DOB:  [birth year]    Age:  81 yrs. Medical record number:  [id number]     Account number:  [id number] Primary Care Provider: Prince Solian R ____________________________ CURRENT DIAGNOSES  1. Paroxysmal atrial fibrillation  2. Dyspnea  3. Long term (current) use of anticoagulants  4. Chronic diastolic (congestive) heart failure  5. Hypertensive heart disease without heart failure  6. Chronic obstructive pulmonary disease, unspecified  7. Personal history of other malignant neoplasm of bronchus and lung  8. Type 2 diabetes mellitus with diabetic polyneuropathy  9. Obesity  10. Hyperlipidemia  11. Sleep apnea ____________________________ ALLERGIES  No Known Drug Allergies ____________________________ MEDICATIONS  1. cetirizine 10 mg tablet, 1 p.o. daily  2. Glucosamine 1500 Complex 500-400 mg capsule, BID  3. metformin 500 mg tablet, BID  4. oxybutynin chloride ER 10 mg tablet,extended release 24 hr, 1 p.o. daily  5. Stool Softener 100 mg tablet, 1 p.o. daily  6. sildenafil 50 mg tablet, Take as directed  7. atorvastatin 20 mg tablet, 1 p.o. daily  8. amiodarone 200 mg tablet, 1 p.o. daily  9. Levemir FlexTouch 100 unit/mL (3 mL) subcutaneous insulin pen, 16 u qam  12u qpm  10. temazepam 15 mg capsule, QHS  11. Bydureon 2 mg/0.65 mL subcutaneous pen injector, weekly  12. furosemide 40 mg tablet, 1 qd and 1 extra prn  13. Eliquis 5 mg tablet, BID  14. Cartia XT 240 mg capsule,extended release, 1 p.o. daily ____________________________ CHIEF COMPLAINTS  Followup of Chronic diastolic (congestive) heart failure  Followup of Hypertensive heart disease without heart failure ____________________________ HISTORY OF PRESENT ILLNESS This very nice 81 year old male is seen in followup of paroxysmal atrial fibrillation. He had an episode where he developed some fluid  retention and was out of rhythm a few weeks ago and took extra furosemide resolve this. He continues to have dyspnea with exertion and has to wear oxygen. He is not currently having angina. He denies PND orthopnea, syncope, or claudication. He was seen recently by the oncologist and his lung cancer is evidently under control. He has had no bleeding complications from anticoagulation. ____________________________ PAST HISTORY  Past Medical Illnesses:  stage 3 lung cancer treated with radiation and chemotherapy, DM-insulin dependent, hypertension, hyperlipidemia, COPD, sleep apnea, peripheral neuropathy, history of DVT of axillary vein, BPH, diverticulosis;  Cardiovascular Illnesses:  CAD, diastolic CHF, atrial fibrillation-paroxysmal;  Surgical Procedures:  bronchoscopy, deviated septum repair;  NYHA Classification:  II;  Canadian Angina Classification:  Class 0: Asymptomatic;  Cardiology Procedures-Invasive:  cardiac cath (left);  Cardiology Procedures-Noninvasive:  echocardiogram December 2015;  LVEF of 50% documented via echocardiogram on 11/07/2012,   ____________________________ CARDIO-PULMONARY TEST DATES EKG Date:  05/17/2014;  Echocardiography Date: 05/23/2014;  Chest Xray Date: 05/17/2014;   ____________________________ FAMILY HISTORY Brother -- Brother dead, Unknown disease Brother -- Brother dead, Myocardial infarction Brother -- Chronic obstructive lung disease, Deceased Brother -- Brother dead Brother -- Brother dead, Coronary Artery Disease Brother -- Brother dead Father -- Father dead, Heart Attack Mother -- Mother dead, Parkinsonism Sister -- Carcinoma of breast, Deceased ____________________________ SOCIAL HISTORY Alcohol Use:  no alcohol use;  Smoking:  used to smoke but quit 2010, , 50 pack year history;  Diet:  regular diet;  Lifestyle:  separated;  Exercise:  no regular exercise;  Occupation:  Psychologist, sport and exercise;  Residence:  lives alone;   ____________________________  REVIEW OF  SYSTEMS General:  obesity, malaise and fatigue Eyes: wears eye glasses/contact lenses Respiratory: dyspnea with exertion Cardiovascular:  please review HPI Abdominal: denies dyspepsia, GI bleeding, constipation, or diarrhea Genitourinary-Male: nocturia  Musculoskeletal:  denies arthritis, venous insufficiency, or muscle weakness. Psychiatric:  situational stress  ____________________________ PHYSICAL EXAMINATION VITAL SIGNS  Blood Pressure:  110/60 Sitting, Left arm, large cuff  , 110/54 Standing, Left arm and regular cuff   Pulse:  76/min. Weight:  232.00 lbs. Height:  71"BMI: 32  Constitutional:  pleasant white male in no acute distress wearing oxygen Skin:  warm and dry to touch, no apparent skin lesions, or masses noted. Head:  normocephalic, balding male hair pattern Eyes:  EOMS Intact, PERRLA, C and S clear, Funduscopic exam not done. Neck:  supple, without massess. No JVD, thyromegaly or carotid bruits. Carotid upstroke normal. Chest:  normal symmetry, clear to auscultation. Cardiac:  regular rhythm, normal S1 and S2, no S3 or S4, no murmurs,clicks, or rub heard Peripheral Pulses:  the femoral,dorsalis pedis, and posterior tibial pulses are full and equal bilaterally with no bruits auscultated. Extremities & Back:  1+ edema Neurological:  no gross motor or sensory deficits noted, affect appropriate, oriented x3. ____________________________ MOST RECENT LIPID PANEL 11/08/12  CHOL TOTL 117 mg/dl, LDL 48 calc, HDL 45 mg/dl, TRIGLYCER 121 mg/dl and CHOL/HDL 2.6 (Calc) ____________________________ IMPRESSIONS/PLAN  1. Paroxysmal atrial fibrillation suppressed on amiodarone for the most part 2. Chronic diastolic congestive heart failure 3. Long-term use of anticoagulation without complications 4. COPD and sleep apnea oxygen dependent currently with limiting dyspnea  Recommendations:  Lab reviewed recently showing normal chemistry panel and slightly elevated TSH. This will be taken  care of by primary doctor. Recommended followup in 6 months with EKG at that time. Continue amiodarone. ____________________________ TODAYS ORDERS  1. Return Visit: 6 months  2. 12 Lead EKG: 6 months                       ____________________________ Cardiology Physician:  Kerry Hough MD [facility]

## 2014-12-14 ENCOUNTER — Ambulatory Visit: Payer: Medicare Other

## 2014-12-14 ENCOUNTER — Other Ambulatory Visit: Payer: Medicare Other | Admitting: Lab

## 2014-12-14 ENCOUNTER — Ambulatory Visit: Payer: Medicare Other | Admitting: Hematology & Oncology

## 2014-12-14 DIAGNOSIS — C3490 Malignant neoplasm of unspecified part of unspecified bronchus or lung: Secondary | ICD-10-CM

## 2014-12-14 MED ORDER — HEPARIN SOD (PORK) LOCK FLUSH 100 UNIT/ML IV SOLN
500.0000 [IU] | Freq: Once | INTRAVENOUS | Status: AC
  Administered 2014-12-14: 500 [IU] via INTRAVENOUS
  Filled 2014-12-14: qty 5

## 2014-12-14 MED ORDER — SODIUM CHLORIDE 0.9 % IJ SOLN
10.0000 mL | INTRAMUSCULAR | Status: DC | PRN
  Administered 2014-12-14: 10 mL via INTRAVENOUS
  Filled 2014-12-14: qty 10

## 2014-12-14 NOTE — Patient Instructions (Signed)

## 2015-01-03 ENCOUNTER — Encounter: Payer: Self-pay | Admitting: Neurology

## 2015-01-03 ENCOUNTER — Ambulatory Visit (INDEPENDENT_AMBULATORY_CARE_PROVIDER_SITE_OTHER): Payer: Medicare Other | Admitting: Neurology

## 2015-01-03 VITALS — BP 112/60 | HR 74 | Resp 20 | Ht 70.0 in | Wt 229.0 lb

## 2015-01-03 DIAGNOSIS — G4733 Obstructive sleep apnea (adult) (pediatric): Secondary | ICD-10-CM

## 2015-01-03 DIAGNOSIS — R0902 Hypoxemia: Secondary | ICD-10-CM

## 2015-01-03 DIAGNOSIS — Z9989 Dependence on other enabling machines and devices: Secondary | ICD-10-CM

## 2015-01-03 NOTE — Progress Notes (Signed)
[name] is here today for a followup visit, yearly routine for CPAP compliance.  01-03-15  [name] has benefited greatly from using CPAP and his sleepiness score isn't or is at 2 points and this was on the for the question of lying down to rest in the afternoon before CPAP he fell asleep in public and while driving in a car he has not done the same ever since. He endorsed the geriatric depression score at one point on the this seems to be normal this order. He has been compliant with all his other medications medical treatments. The patient has not lost further weight but was not be would not be indicated given that he has an underlying condition of lung cancer. He has been treated with a new medication which has  dereased his blood sugars ( byderune)- at least temporarily.Carries his  oxygen tank with him- hypoxemia treatment during COPD.  Bed time is from 10 30 PM , wakes up at 2.30 , nocturia break, falls asleep again easily and sleeps through to 6 AM.  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 He only now is able to sleep past that time.  Average user time for CPAP is 7 hours , oxygen bled  in , high compliance.  Wakes up spontaneously , no alarm needed.  He underwent (  08-25-12 )re titration to a new CPAP pressure. As described in my visit note from 10-29-12,  the patient had used originally 10 and 11 cm water pressure is but felt not longer the same , his sleep was not longer restorative. He had also other medical co morbidities that may affect his breathing.  [name] had last lost 65 pounds after he was diagnosed with small cell lung cancer, he is retired ( used to work as a Psychologist, sport and exercise,  a lot of physical activity, he also has been a smoker until 2012 and stopped after being diagnosed with COPD). The patient used CPAP auto titration through May 2014,  revealing the 95 th percentile pressure at 9 cm water .  His download from 12-05-12  documented  93% compliance, the  residual the AHI of 2.0 and a use at time of 7 hours and 12 minutes nightly. The results are excellent his fatigue score remains high at 61 points this sleepiness score however is reduced to 7 points. I attribute the fatigue still to his other medical problems and he is now on oxygen continuously but at night is attached to her CPAP machine. His fall risk assessment is 10 pointsThe patient's bedtime is around 10 to 10:30 PM, he normally has one bathroom break at 4 AM, sometimes he has trouble reinitiating sleep after that but he finally will rise and the morning at about 6 AM. His overall nocturnal sleep time is between 6 and 7 hours and this is reflected in his CPAP user time. No adjustments are necessary.    NEUROLOGIC/  SLEEP FOCUSSED EXAM : 01-03-15   General: The patient is awake, alert and appears not in acute distress. The patient is well groomed. Head: Normocephalic, atraumatic.  Neck is supple. Mallampati4, neck circumference: 17.5  Cardiovascular:  Regular rate and rhythm , without  murmurs or carotid bruit, and without distended neck veins. Respiratory: Lungs are clear to auscultation. Skin:  Without evidence of edema, or rash- he has facial rosacea.  Trunk: BMI is  elevated , patient has a mildly stooped  Posture.  Neurologic exam : The patient is awake and alert, oriented to place and time.  Memory subjective  described as intact. There is a normal attention span & concentration ability. Speech is fluent without  dysarthria, dysphonia or aphasia. Mood and affect are appropriate.  Cranial nerves: Pupils are equal and briskly reactive to light. Funduscopic exam without   evidence of pallor or edema. Extraocular movements  in vertical and horizontal planes intact and without nystagmus. Visual fields by finger perimetry are intact. Hearing to finger rub intact.  Facial sensation intact to fine touch. Facial motor strength is symmetric and tongue and uvula move midline.  Motor exam:    Normal tone and  muscle bulk and symmetric strength in all extremities.  Sensory:  Fine touch, pinprick and vibration were decreased over both feet up to the ankle level. Proprioception is affected.  Gait and station: Patient walks without assistive device , . Strength within normal limits. Stance is stable and normal.   Deep tendon reflexes: in the  upper and lower extremities are attenuated in the lower extremities and present 2 plus in the upper extremities.  Babinski maneuver response is absent .   Assessment:  After physical and neurologic examination, review of laboratory studies, imaging, neurophysiology testing and pre-existing records, assessment :  OSA: [name] is highly compliant with his CPAP a download from 7-20 5-16 confirms 100% compliance for over 30 days with 7 hours and 38 minutes of average use. Pressure is set at 9 cm water with an EPR of 2 oxygen is attached. The patient has an AHI of 1.7   hypoxemia overlap syndrome COPD, lung cancer- he was treated with chemotherapy and radiation and Dr Marin Olp stated he has "no living cells " in his mass. Stable finding. .  Well treated and responding to CPAP at current level , Oxygen supplementation was needed. He is on pulse nasal canula oxygen.  Diabetes with peripheral neuropathy , and sensory loss, ataxia. Progressive Form.    Plan:  Treatment plan and additional workup:  continue CPAP and yearly RV.

## 2015-01-31 ENCOUNTER — Encounter: Payer: Self-pay | Admitting: *Deleted

## 2015-02-07 ENCOUNTER — Other Ambulatory Visit: Payer: Self-pay | Admitting: *Deleted

## 2015-02-07 DIAGNOSIS — C3400 Malignant neoplasm of unspecified main bronchus: Secondary | ICD-10-CM

## 2015-02-08 ENCOUNTER — Encounter: Payer: Self-pay | Admitting: Hematology & Oncology

## 2015-02-08 ENCOUNTER — Ambulatory Visit (HOSPITAL_BASED_OUTPATIENT_CLINIC_OR_DEPARTMENT_OTHER): Payer: Medicare Other | Admitting: Hematology & Oncology

## 2015-02-08 ENCOUNTER — Other Ambulatory Visit (HOSPITAL_BASED_OUTPATIENT_CLINIC_OR_DEPARTMENT_OTHER): Payer: Medicare Other

## 2015-02-08 ENCOUNTER — Ambulatory Visit: Payer: Medicare Other

## 2015-02-08 VITALS — BP 128/77 | HR 79 | Temp 97.2°F | Wt 226.0 lb

## 2015-02-08 DIAGNOSIS — I279 Pulmonary heart disease, unspecified: Secondary | ICD-10-CM | POA: Diagnosis not present

## 2015-02-08 DIAGNOSIS — C801 Malignant (primary) neoplasm, unspecified: Secondary | ICD-10-CM

## 2015-02-08 DIAGNOSIS — I480 Paroxysmal atrial fibrillation: Secondary | ICD-10-CM | POA: Diagnosis not present

## 2015-02-08 DIAGNOSIS — C3491 Malignant neoplasm of unspecified part of right bronchus or lung: Secondary | ICD-10-CM

## 2015-02-08 DIAGNOSIS — C3400 Malignant neoplasm of unspecified main bronchus: Secondary | ICD-10-CM

## 2015-02-08 DIAGNOSIS — C3411 Malignant neoplasm of upper lobe, right bronchus or lung: Secondary | ICD-10-CM

## 2015-02-08 LAB — CBC WITH DIFFERENTIAL (CANCER CENTER ONLY)
BASO#: 0 10*3/uL (ref 0.0–0.2)
BASO%: 0.2 % (ref 0.0–2.0)
EOS%: 1.4 % (ref 0.0–7.0)
Eosinophils Absolute: 0.1 10*3/uL (ref 0.0–0.5)
HCT: 40.2 % (ref 38.7–49.9)
HGB: 13.3 g/dL (ref 13.0–17.1)
LYMPH#: 1.6 10*3/uL (ref 0.9–3.3)
LYMPH%: 18.4 % (ref 14.0–48.0)
MCH: 29 pg (ref 28.0–33.4)
MCHC: 33.1 g/dL (ref 32.0–35.9)
MCV: 88 fL (ref 82–98)
MONO#: 0.8 10*3/uL (ref 0.1–0.9)
MONO%: 9.2 % (ref 0.0–13.0)
NEUT#: 6.1 10*3/uL (ref 1.5–6.5)
NEUT%: 70.8 % (ref 40.0–80.0)
PLATELETS: 180 10*3/uL (ref 145–400)
RBC: 4.59 10*6/uL (ref 4.20–5.70)
RDW: 17.3 % — AB (ref 11.1–15.7)
WBC: 8.6 10*3/uL (ref 4.0–10.0)

## 2015-02-08 LAB — CMP (CANCER CENTER ONLY)
ALK PHOS: 96 U/L — AB (ref 26–84)
ALT: 97 U/L — AB (ref 10–47)
AST: 137 U/L — AB (ref 11–38)
Albumin: 3.4 g/dL (ref 3.3–5.5)
BILIRUBIN TOTAL: 0.7 mg/dL (ref 0.20–1.60)
BUN: 24 mg/dL — AB (ref 7–22)
CO2: 28 meq/L (ref 18–33)
CREATININE: 1.5 mg/dL — AB (ref 0.6–1.2)
Calcium: 8.9 mg/dL (ref 8.0–10.3)
Chloride: 102 mEq/L (ref 98–108)
GLUCOSE: 97 mg/dL (ref 73–118)
Potassium: 4.4 mEq/L (ref 3.3–4.7)
SODIUM: 141 meq/L (ref 128–145)
Total Protein: 7.3 g/dL (ref 6.4–8.1)

## 2015-02-08 LAB — T4, FREE: FREE T4: 1.1 ng/dL (ref 0.80–1.80)

## 2015-02-08 MED ORDER — SODIUM CHLORIDE 0.9 % IJ SOLN
10.0000 mL | INTRAMUSCULAR | Status: DC | PRN
  Administered 2015-02-08: 10 mL via INTRAVENOUS
  Filled 2015-02-08: qty 10

## 2015-02-08 MED ORDER — HEPARIN SOD (PORK) LOCK FLUSH 100 UNIT/ML IV SOLN
500.0000 [IU] | Freq: Once | INTRAVENOUS | Status: AC
  Administered 2015-02-08: 500 [IU] via INTRAVENOUS
  Filled 2015-02-08: qty 5

## 2015-02-08 NOTE — Patient Instructions (Signed)

## 2015-02-09 LAB — TSH CHCC: TSH: 5.649 m[IU]/L — AB (ref 0.320–4.118)

## 2015-02-10 ENCOUNTER — Telehealth: Payer: Self-pay | Admitting: Hematology & Oncology

## 2015-02-10 NOTE — Telephone Encounter (Signed)
Spoke with pt he would like Dr. Marin Olp to talk with his daughter regarding the removal of the port.  Butch Penny [phone]

## 2015-02-10 NOTE — Progress Notes (Signed)
Hematology and Oncology Follow Up Visit  [name] [id number] [birth year] 81 y.o. 02/10/2015   Principle Diagnosis:  . 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 2. Paroxysmal atrial fibrillation. 3. 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  Current Therapy:   Eliquis 5 mg p.o. b.i.d.     Interim History:  [name] is back for followup. He looks pretty good. He feels okay. He's had a decent summer.  He is on supplemental oxygen. I think he'll be on this for a long while.  He's had no problems with nausea or vomiting. He's had no fever. He's had no bleeding. He is on ELIQUIS because of the paroxysmal atrial fibrillation.  He's had no problems pain wise. He does have some arthritic issues.  There is no change in bowel or bladder habits.  He's had no leg swelling. He's had no rashes.  His last chest x-ray was back in December. Everything looked okay.   Medications:  Current outpatient prescriptions:  .  amiodarone (PACERONE) 200 MG tablet, Take 200 mg by mouth daily., Disp: , Rfl:  .  apixaban (ELIQUIS) 5 MG TABS tablet, Take 1 tablet (5 mg total) by mouth 2 (two) times daily., Disp: 60 tablet, Rfl: 12 .  atorvastatin (LIPITOR) 20 MG tablet, Take 1 tablet (20 mg total) by mouth daily., Disp: 30 tablet, Rfl: 12 .  cetirizine (ZYRTEC) 10 MG tablet, Take 10 mg by mouth every morning. , Disp: , Rfl:  .  diltiazem (CARDIZEM CD) 240 MG 24 hr capsule, Take 1 capsule (240 mg total) by mouth daily., Disp: 30 capsule, Rfl: 12 .  Exenatide ER (BYDUREON) 2 MG PEN, Inject into the skin. One injection weekly, Disp: , Rfl:  .  furosemide (LASIX) 40 MG tablet, Take 1 tablet (40 mg total) by mouth daily., Disp: 30 tablet, Rfl: 12 .  glucosamine-chondroitin 500-400 MG tablet, Take 1 tablet by mouth 2 (two) times daily. , Disp: , Rfl:  .  insulin detemir (LEVEMIR) 100 UNIT/ML injection, Inject into the skin 2 (two) times daily. 16 U.  IN AM .AND 12 U. IN PM, Disp: , Rfl:  .  LEVEMIR FLEXTOUCH 100 UNIT/ML Pen, Inject 100 Units into the skin daily at 6 (six) AM., Disp: , Rfl: 2 .  metFORMIN (GLUCOPHAGE) 500 MG tablet, Take 500 mg by mouth 2 (two) times daily., Disp: , Rfl:  .  oxybutynin (DITROPAN-XL) 10 MG 24 hr tablet, , Disp: , Rfl: 1 .  PROAIR HFA 108 (90 BASE) MCG/ACT inhaler, INHALE TWO PUFFS INTO THE LUNGS EVERY 6 HOURS AS NEEDED FOR WHEEZING AND FOR SHORTNESS OF BREATH, Disp: 9 each, Rfl: 5 .  Psyllium (METAMUCIL PO), Take 2 capsules by mouth daily as needed. For constipation, Disp: , Rfl:  .  temazepam (RESTORIL) 15 MG capsule, TAKE ONE CAPSULE BY MOUTH AT BEDTIME AS NEEDED FOR SLEEP, Disp: 30 capsule, Rfl: 0  Allergies: No Known Allergies  Past Medical History, Surgical history, Social history, and Family History were reviewed and updated.  Review of Systems: As above  Physical Exam:  weight is 226 lb (102.513 kg). His oral temperature is 97.2 F (36.2 C). His blood pressure is 128/77 and his pulse is 79.   Very [name] gentleman. Head and neck exam shows no ocular or oral lesions. He has no adenopathy in the neck. Lungs are clear. Cardiac exam regular rate and rhythm this is with atrial  fibrillation. He has a 1/6 murmur. Abdomen soft. Has good bowel sounds. There is no palpable abdominal mass. There is no fluid wave. Back exam no tenderness over the spine ribs or hips. Extremities shows some trace chronic edema in his legs. Has good strength. Has good range of motion of his joints. Skin exam shows some actinic keratoses. Some ecchymoses are noted. No petechia. Neurological exam is negative. Lab Results  Component Value Date   WBC 8.6 02/08/2015   HGB 13.3 02/08/2015   HCT 40.2 02/08/2015   MCV 88 02/08/2015   PLT 180 02/08/2015     Chemistry      Component Value Date/Time   NA 141 02/08/2015 1120   NA 140 08/24/2014 0952   K 4.4 02/08/2015 1120   K 4.0 08/24/2014 0952   CL 102 02/08/2015 1120   CL 100  08/24/2014 0952   CO2 28 02/08/2015 1120   CO2 26 08/24/2014 0952   BUN 24* 02/08/2015 1120   BUN 22 08/24/2014 0952   CREATININE 1.5* 02/08/2015 1120   CREATININE 1.50* 08/24/2014 0952      Component Value Date/Time   CALCIUM 8.9 02/08/2015 1120   CALCIUM 8.4 08/24/2014 0952   ALKPHOS 96* 02/08/2015 1120   ALKPHOS 89 08/24/2014 0952   AST 137* 02/08/2015 1120   AST 81* 08/24/2014 0952   ALT 97* 02/08/2015 1120   ALT 72* 08/24/2014 0952   BILITOT 0.70 02/08/2015 1120   BILITOT 0.5 08/24/2014 0952         Impression and Plan: [name] is 81 year old gentleman with a history of locally advanced-stage IIIB-adenocarcinoma of the right lung. He had contralateral mediastinal node involvement. He was treated with radiation chemotherapy. He had a very nice response. He completed  treatment in December of 2012. Thankfully, His disease has not come back. This I am surprised by because of the extensive nature of his locally advanced disease.  He has cardiac issues. He has pulmonary issues. These clearly are more important for his prognosis now.  I think we can get him back in 6 months.  He does have a Port-A-Cath in. I do think that the Port-A-Cath will be useful for him given his other health issues and the fact that he likely will be hospitalized at some point because of complications of his cardiopulmonary disease.   Volanda Napoleon, MD 9/2/20167:16 AM

## 2015-04-05 ENCOUNTER — Ambulatory Visit (HOSPITAL_BASED_OUTPATIENT_CLINIC_OR_DEPARTMENT_OTHER): Payer: Medicare Other

## 2015-04-05 VITALS — BP 136/82 | HR 58 | Temp 97.6°F | Resp 14

## 2015-04-05 DIAGNOSIS — C3411 Malignant neoplasm of upper lobe, right bronchus or lung: Secondary | ICD-10-CM

## 2015-04-05 DIAGNOSIS — Z452 Encounter for adjustment and management of vascular access device: Secondary | ICD-10-CM | POA: Diagnosis not present

## 2015-04-05 DIAGNOSIS — C3491 Malignant neoplasm of unspecified part of right bronchus or lung: Secondary | ICD-10-CM

## 2015-04-05 MED ORDER — HEPARIN SOD (PORK) LOCK FLUSH 100 UNIT/ML IV SOLN
500.0000 [IU] | Freq: Once | INTRAVENOUS | Status: AC
  Administered 2015-04-05: 500 [IU] via INTRAVENOUS
  Filled 2015-04-05: qty 5

## 2015-04-05 MED ORDER — SODIUM CHLORIDE 0.9 % IJ SOLN
10.0000 mL | INTRAMUSCULAR | Status: DC | PRN
  Administered 2015-04-05: 10 mL via INTRAVENOUS
  Filled 2015-04-05: qty 10

## 2015-04-05 NOTE — Patient Instructions (Signed)

## 2015-05-07 ENCOUNTER — Encounter (HOSPITAL_COMMUNITY): Payer: Self-pay | Admitting: *Deleted

## 2015-05-07 ENCOUNTER — Inpatient Hospital Stay (HOSPITAL_COMMUNITY)
Admission: EM | Admit: 2015-05-07 | Discharge: 2015-05-11 | DRG: 871 | Disposition: A | Discharge status: Skilled Nursing Facility | Payer: Medicare Other | Attending: Internal Medicine | Admitting: Internal Medicine

## 2015-05-07 ENCOUNTER — Emergency Department (HOSPITAL_COMMUNITY): Payer: Medicare Other

## 2015-05-07 DIAGNOSIS — Z7901 Long term (current) use of anticoagulants: Secondary | ICD-10-CM

## 2015-05-07 DIAGNOSIS — E1122 Type 2 diabetes mellitus with diabetic chronic kidney disease: Secondary | ICD-10-CM | POA: Diagnosis present

## 2015-05-07 DIAGNOSIS — Z9981 Dependence on supplemental oxygen: Secondary | ICD-10-CM

## 2015-05-07 DIAGNOSIS — Z9989 Dependence on other enabling machines and devices: Secondary | ICD-10-CM

## 2015-05-07 DIAGNOSIS — I10 Essential (primary) hypertension: Secondary | ICD-10-CM | POA: Diagnosis present

## 2015-05-07 DIAGNOSIS — Z8701 Personal history of pneumonia (recurrent): Secondary | ICD-10-CM

## 2015-05-07 DIAGNOSIS — N179 Acute kidney failure, unspecified: Secondary | ICD-10-CM | POA: Diagnosis present

## 2015-05-07 DIAGNOSIS — Z833 Family history of diabetes mellitus: Secondary | ICD-10-CM

## 2015-05-07 DIAGNOSIS — E1142 Type 2 diabetes mellitus with diabetic polyneuropathy: Secondary | ICD-10-CM | POA: Diagnosis present

## 2015-05-07 DIAGNOSIS — I480 Paroxysmal atrial fibrillation: Secondary | ICD-10-CM | POA: Diagnosis present

## 2015-05-07 DIAGNOSIS — Z87891 Personal history of nicotine dependence: Secondary | ICD-10-CM

## 2015-05-07 DIAGNOSIS — E872 Acidosis, unspecified: Secondary | ICD-10-CM | POA: Diagnosis present

## 2015-05-07 DIAGNOSIS — G4733 Obstructive sleep apnea (adult) (pediatric): Secondary | ICD-10-CM | POA: Diagnosis present

## 2015-05-07 DIAGNOSIS — Z85118 Personal history of other malignant neoplasm of bronchus and lung: Secondary | ICD-10-CM

## 2015-05-07 DIAGNOSIS — I5033 Acute on chronic diastolic (congestive) heart failure: Secondary | ICD-10-CM | POA: Diagnosis present

## 2015-05-07 DIAGNOSIS — Z820 Family history of epilepsy and other diseases of the nervous system: Secondary | ICD-10-CM

## 2015-05-07 DIAGNOSIS — H9190 Unspecified hearing loss, unspecified ear: Secondary | ICD-10-CM | POA: Diagnosis present

## 2015-05-07 DIAGNOSIS — N184 Chronic kidney disease, stage 4 (severe): Secondary | ICD-10-CM | POA: Diagnosis present

## 2015-05-07 DIAGNOSIS — A419 Sepsis, unspecified organism: Secondary | ICD-10-CM | POA: Diagnosis not present

## 2015-05-07 DIAGNOSIS — Z8249 Family history of ischemic heart disease and other diseases of the circulatory system: Secondary | ICD-10-CM

## 2015-05-07 DIAGNOSIS — E785 Hyperlipidemia, unspecified: Secondary | ICD-10-CM | POA: Diagnosis present

## 2015-05-07 DIAGNOSIS — R9431 Abnormal electrocardiogram [ECG] [EKG]: Secondary | ICD-10-CM

## 2015-05-07 DIAGNOSIS — Z794 Long term (current) use of insulin: Secondary | ICD-10-CM

## 2015-05-07 DIAGNOSIS — J189 Pneumonia, unspecified organism: Secondary | ICD-10-CM | POA: Diagnosis present

## 2015-05-07 DIAGNOSIS — I4581 Long QT syndrome: Secondary | ICD-10-CM | POA: Diagnosis present

## 2015-05-07 DIAGNOSIS — R0602 Shortness of breath: Secondary | ICD-10-CM

## 2015-05-07 DIAGNOSIS — J9621 Acute and chronic respiratory failure with hypoxia: Secondary | ICD-10-CM | POA: Diagnosis present

## 2015-05-07 DIAGNOSIS — E1129 Type 2 diabetes mellitus with other diabetic kidney complication: Secondary | ICD-10-CM

## 2015-05-07 DIAGNOSIS — W19XXXA Unspecified fall, initial encounter: Secondary | ICD-10-CM | POA: Diagnosis present

## 2015-05-07 DIAGNOSIS — I130 Hypertensive heart and chronic kidney disease with heart failure and stage 1 through stage 4 chronic kidney disease, or unspecified chronic kidney disease: Secondary | ICD-10-CM | POA: Diagnosis present

## 2015-05-07 DIAGNOSIS — J449 Chronic obstructive pulmonary disease, unspecified: Secondary | ICD-10-CM | POA: Diagnosis present

## 2015-05-07 LAB — COMPREHENSIVE METABOLIC PANEL
ALBUMIN: 3.1 g/dL — AB (ref 3.5–5.0)
ALK PHOS: 95 U/L (ref 38–126)
ALT: 65 U/L — AB (ref 17–63)
AST: 90 U/L — AB (ref 15–41)
Anion gap: 10 (ref 5–15)
BILIRUBIN TOTAL: 0.6 mg/dL (ref 0.3–1.2)
BUN: 27 mg/dL — AB (ref 6–20)
CALCIUM: 9 mg/dL (ref 8.9–10.3)
CO2: 24 mmol/L (ref 22–32)
CREATININE: 1.75 mg/dL — AB (ref 0.61–1.24)
Chloride: 101 mmol/L (ref 101–111)
GFR calc Af Amer: 38 mL/min — ABNORMAL LOW (ref >60)
GFR calc non Af Amer: 33 mL/min — ABNORMAL LOW (ref >60)
GLUCOSE: 138 mg/dL — AB (ref 65–99)
Potassium: 3.7 mmol/L (ref 3.5–5.1)
Sodium: 135 mmol/L (ref 135–145)
TOTAL PROTEIN: 6.6 g/dL (ref 6.5–8.1)

## 2015-05-07 LAB — URINALYSIS, ROUTINE W REFLEX MICROSCOPIC
BILIRUBIN URINE: NEGATIVE
GLUCOSE, UA: 100 mg/dL — AB
Hgb urine dipstick: NEGATIVE
KETONES UR: NEGATIVE mg/dL
LEUKOCYTES UA: NEGATIVE
NITRITE: NEGATIVE
PROTEIN: NEGATIVE mg/dL
Specific Gravity, Urine: 1.03 (ref 1.005–1.030)
pH: 5.5 (ref 5.0–8.0)

## 2015-05-07 LAB — CBC WITH DIFFERENTIAL/PLATELET
BASOS ABS: 0 10*3/uL (ref 0.0–0.1)
BASOS PCT: 0 %
EOS PCT: 0 %
Eosinophils Absolute: 0 10*3/uL (ref 0.0–0.7)
HCT: 38.4 % — ABNORMAL LOW (ref 39.0–52.0)
Hemoglobin: 12.9 g/dL — ABNORMAL LOW (ref 13.0–17.0)
Lymphocytes Relative: 8 %
Lymphs Abs: 1.6 10*3/uL (ref 0.7–4.0)
MCH: 29.4 pg (ref 26.0–34.0)
MCHC: 33.6 g/dL (ref 30.0–36.0)
MCV: 87.5 fL (ref 78.0–100.0)
MONO ABS: 1.6 10*3/uL — AB (ref 0.1–1.0)
Monocytes Relative: 8 %
Neutro Abs: 16.5 10*3/uL — ABNORMAL HIGH (ref 1.7–7.7)
Neutrophils Relative %: 84 %
PLATELETS: 175 10*3/uL (ref 150–400)
RBC: 4.39 MIL/uL (ref 4.22–5.81)
RDW: 15.3 % (ref 11.5–15.5)
WBC: 19.8 10*3/uL — ABNORMAL HIGH (ref 4.0–10.5)

## 2015-05-07 LAB — I-STAT CG4 LACTIC ACID, ED
LACTIC ACID, VENOUS: 2.06 mmol/L — AB (ref 0.5–2.0)
Lactic Acid, Venous: 2.58 mmol/L (ref 0.5–2.0)

## 2015-05-07 LAB — TROPONIN I: TROPONIN I: 0.03 ng/mL (ref <0.031)

## 2015-05-07 MED ORDER — INSULIN DETEMIR 100 UNIT/ML ~~LOC~~ SOLN
12.0000 [IU] | Freq: Every day | SUBCUTANEOUS | Status: DC
  Administered 2015-05-08 – 2015-05-09 (×2): 12 [IU] via SUBCUTANEOUS
  Filled 2015-05-07 (×4): qty 0.12

## 2015-05-07 MED ORDER — POTASSIUM CHLORIDE IN NACL 40-0.9 MEQ/L-% IV SOLN
INTRAVENOUS | Status: DC
  Administered 2015-05-07: 50 mL/h via INTRAVENOUS
  Filled 2015-05-07 (×2): qty 1000

## 2015-05-07 MED ORDER — DILTIAZEM HCL ER COATED BEADS 240 MG PO CP24
240.0000 mg | ORAL_CAPSULE | Freq: Every day | ORAL | Status: DC
  Administered 2015-05-08: 240 mg via ORAL
  Filled 2015-05-07 (×2): qty 1

## 2015-05-07 MED ORDER — INSULIN DETEMIR 100 UNIT/ML FLEXPEN: 12.0000 [IU] | PEN_INJECTOR | Freq: Two times a day (BID) | SUBCUTANEOUS | Status: DC | PRN

## 2015-05-07 MED ORDER — IPRATROPIUM BROMIDE 0.02 % IN SOLN
0.5000 mg | Freq: Four times a day (QID) | RESPIRATORY_TRACT | Status: DC
  Administered 2015-05-07 – 2015-05-11 (×13): 0.5 mg via RESPIRATORY_TRACT
  Filled 2015-05-07 (×14): qty 2.5

## 2015-05-07 MED ORDER — APIXABAN 5 MG PO TABS
5.0000 mg | ORAL_TABLET | Freq: Two times a day (BID) | ORAL | Status: DC
  Administered 2015-05-07 – 2015-05-11 (×8): 5 mg via ORAL
  Filled 2015-05-07 (×8): qty 1

## 2015-05-07 MED ORDER — MAGNESIUM SULFATE 2 GM/50ML IV SOLN
2.0000 g | Freq: Once | INTRAVENOUS | Status: AC
  Administered 2015-05-07: 2 g via INTRAVENOUS
  Filled 2015-05-07: qty 50

## 2015-05-07 MED ORDER — SODIUM CHLORIDE 0.9 % IV BOLUS (SEPSIS)
500.0000 mL | Freq: Once | INTRAVENOUS | Status: AC
  Administered 2015-05-07: 500 mL via INTRAVENOUS

## 2015-05-07 MED ORDER — DEXTROSE 5 % IV SOLN
500.0000 mg | INTRAVENOUS | Status: DC
  Administered 2015-05-08: 500 mg via INTRAVENOUS
  Filled 2015-05-07 (×2): qty 500

## 2015-05-07 MED ORDER — INSULIN ASPART 100 UNIT/ML ~~LOC~~ SOLN
0.0000 [IU] | Freq: Three times a day (TID) | SUBCUTANEOUS | Status: DC
  Administered 2015-05-08: 11 [IU] via SUBCUTANEOUS

## 2015-05-07 MED ORDER — AMIODARONE HCL 200 MG PO TABS
200.0000 mg | ORAL_TABLET | Freq: Every day | ORAL | Status: DC
  Administered 2015-05-08 – 2015-05-11 (×4): 200 mg via ORAL
  Filled 2015-05-07 (×4): qty 1

## 2015-05-07 MED ORDER — LORATADINE 10 MG PO TABS
10.0000 mg | ORAL_TABLET | Freq: Every day | ORAL | Status: DC
  Administered 2015-05-08 – 2015-05-11 (×4): 10 mg via ORAL
  Filled 2015-05-07 (×4): qty 1

## 2015-05-07 MED ORDER — ATORVASTATIN CALCIUM 20 MG PO TABS
20.0000 mg | ORAL_TABLET | Freq: Every day | ORAL | Status: DC
  Administered 2015-05-07 – 2015-05-10 (×4): 20 mg via ORAL
  Filled 2015-05-07 (×4): qty 1

## 2015-05-07 MED ORDER — IPRATROPIUM-ALBUTEROL 0.5-2.5 (3) MG/3ML IN SOLN
3.0000 mL | Freq: Once | RESPIRATORY_TRACT | Status: AC
  Administered 2015-05-07: 3 mL via RESPIRATORY_TRACT
  Filled 2015-05-07: qty 3

## 2015-05-07 MED ORDER — LEVALBUTEROL HCL 1.25 MG/0.5ML IN NEBU
1.2500 mg | INHALATION_SOLUTION | Freq: Four times a day (QID) | RESPIRATORY_TRACT | Status: DC
  Administered 2015-05-08 – 2015-05-11 (×12): 1.25 mg via RESPIRATORY_TRACT
  Filled 2015-05-07 (×15): qty 0.5

## 2015-05-07 MED ORDER — KETOROLAC TROMETHAMINE 15 MG/ML IJ SOLN
15.0000 mg | Freq: Once | INTRAMUSCULAR | Status: AC
  Administered 2015-05-07: 15 mg via INTRAVENOUS
  Filled 2015-05-07: qty 1

## 2015-05-07 MED ORDER — PREDNISONE 20 MG PO TABS
60.0000 mg | ORAL_TABLET | Freq: Once | ORAL | Status: AC
  Administered 2015-05-07: 60 mg via ORAL
  Filled 2015-05-07: qty 3

## 2015-05-07 MED ORDER — ONDANSETRON HCL 4 MG PO TABS: 4.0000 mg | ORAL_TABLET | Freq: Four times a day (QID) | ORAL | Status: DC | PRN

## 2015-05-07 MED ORDER — SODIUM CHLORIDE 0.9 % IV BOLUS (SEPSIS): 1000.0000 mL | Freq: Once | INTRAVENOUS | Status: DC

## 2015-05-07 MED ORDER — TEMAZEPAM 15 MG PO CAPS
15.0000 mg | ORAL_CAPSULE | Freq: Every evening | ORAL | Status: DC | PRN
  Administered 2015-05-08: 15 mg via ORAL
  Filled 2015-05-07: qty 1

## 2015-05-07 MED ORDER — METHYLPREDNISOLONE SODIUM SUCC 40 MG IJ SOLR
40.0000 mg | Freq: Four times a day (QID) | INTRAMUSCULAR | Status: DC
  Administered 2015-05-08 (×2): 40 mg via INTRAVENOUS
  Filled 2015-05-07 (×2): qty 1

## 2015-05-07 MED ORDER — ONDANSETRON HCL 4 MG/2ML IJ SOLN: 4.0000 mg | Freq: Four times a day (QID) | INTRAMUSCULAR | Status: DC | PRN

## 2015-05-07 MED ORDER — DEXTROSE 5 % IV SOLN
1.0000 g | Freq: Once | INTRAVENOUS | Status: AC
  Administered 2015-05-07: 1 g via INTRAVENOUS
  Filled 2015-05-07: qty 10

## 2015-05-07 MED ORDER — DEXTROSE 5 % IV SOLN
500.0000 mg | Freq: Once | INTRAVENOUS | Status: AC
  Administered 2015-05-07: 500 mg via INTRAVENOUS
  Filled 2015-05-07: qty 500

## 2015-05-07 MED ORDER — DEXTROSE 5 % IV SOLN
1.0000 g | INTRAVENOUS | Status: DC
  Administered 2015-05-08 – 2015-05-10 (×3): 1 g via INTRAVENOUS
  Filled 2015-05-07 (×5): qty 10

## 2015-05-07 MED ORDER — SODIUM CHLORIDE 0.9 % IV BOLUS (SEPSIS)
1000.0000 mL | Freq: Once | INTRAVENOUS | Status: AC
  Administered 2015-05-07: 1000 mL via INTRAVENOUS

## 2015-05-07 MED ORDER — ACETAMINOPHEN 325 MG PO TABS: 650.0000 mg | ORAL_TABLET | Freq: Once | ORAL | Status: DC

## 2015-05-07 MED ORDER — INSULIN DETEMIR 100 UNIT/ML ~~LOC~~ SOLN
16.0000 [IU] | Freq: Every day | SUBCUTANEOUS | Status: DC
  Administered 2015-05-08 – 2015-05-09 (×2): 16 [IU] via SUBCUTANEOUS
  Filled 2015-05-07 (×3): qty 0.16

## 2015-05-07 MED ORDER — OXYBUTYNIN CHLORIDE ER 10 MG PO TB24
10.0000 mg | ORAL_TABLET | Freq: Every day | ORAL | Status: DC
  Administered 2015-05-08 – 2015-05-11 (×4): 10 mg via ORAL
  Filled 2015-05-07 (×5): qty 1

## 2015-05-07 MED ORDER — ACETAMINOPHEN 325 MG PO TABS
650.0000 mg | ORAL_TABLET | Freq: Once | ORAL | Status: AC
  Administered 2015-05-07: 650 mg via ORAL
  Filled 2015-05-07: qty 2

## 2015-05-07 NOTE — ED Notes (Signed)
Report attempted 

## 2015-05-07 NOTE — H&P (Signed)
[facility] History and Physical  [name] WUJ:[id number] DOB: 28-Jun-1924 DOA: 05/07/2015  Referring physician: Dorise Bullion, M.D. PCP: Tivis Ringer, MD   Chief Complaint: Shortness of breath.   HPI: [name] is a 81 y.o. male with a past medical history of COPD, lung cancer, previous pneumonia, hyperlipidemia, paroxysmal atrial fibrillation, diastolic CHF, hypertension, DVT who comes with a history of shortness of breath associated with wheezing, productive cough, pleuritic chest pain, weakness, fever, chills, fatigue since earlier today.   Per patient, he woke up this morning, felt very weak to the point that he had a fall at home sustaining a skin tear in his right upper arm and in his left knee. He denies dizziness, palpitations, chest pain prior to the fall, but states that he fell at right mid back pain just prior to following. The patient has pleuritic pain in this area, although he feels better after he took analgesics at home and was given acetaminophen in the emergency department. He has also received supplemental oxygen, bronchodilators, prednisone and IV antibiotics sustaining some relief. He is currently in no acute distress.   Review of Systems:  Constitutional:  Positive Fevers, chills, fatigue.  No weight loss, night sweats,  HEENT:  Positive Sore throat, nasal congestion and frontal headache. Denies, Difficulty swallowing,Tooth/dental problems, No sneezing, itching, ear ache,  post nasal drip,  Cardio-vascular:  No chest pain, Orthopnea, PND, swelling in lower extremities, anasarca, dizziness, palpitations  GI:  No heartburn, indigestion, abdominal pain, nausea, vomiting, diarrhea, change in bowel habits, loss of appetite  Resp:  Positive dyspnea, wheezing, rhonchi, productive cough of grayish sputum. No hemoptysis  Skin:  Positive skin tears due to fall. GU:  no dysuria, change in color of urine, no urgency or frequency. No flank pain.    Musculoskeletal:  Positive back pain  Psych:  No change in mood or affect. No depression or anxiety. No memory loss.   Past Medical History  Diagnosis Date  . DVT (deep venous thrombosis) ([place])     IN RIGHT ARM 11/2010   . BPH (benign prostatic hyperplasia)   . COPD (chronic obstructive pulmonary disease) ([place])        . Hyperlipidemia   . Allergic rhinitis   . Allergic conjunctivitis   . DJD (degenerative joint disease)     Left Knee  . Pneumonia     history  . Diverticulosis     hx  . Cellulitis     hx  . Hearing loss   . Cataract   . Diabetic peripheral neuropathy ([place])   . Hypertension   . Obstructive sleep apnea (adult)  10/29/2012    This patient has mild obstructive sleep apnea, tested in March 2014 at [facility] sleep. He had been a CPAP user for 14 years. AHi 10.7 He was titrated to a pressure of 9 cm water( after auto - titration). CPAP at night  --- 8-10 YR AGO....,OSA-diagnosed 2000  . Secondary diabetes with peripheral neuropathy ([place]) 11/07/2012  . Acute diastolic CHF (congestive heart failure) ([place])   . Atrial fibrillation ([place])   . Lung cancer ([place])      history of stage IIIA non-small cell lung cancer who is currently being treated with both  Radiation and Chemotherapy   Past Surgical History  Procedure Laterality Date  . Insertion of a left subclavian port-a-cath.  12/17/10    [place]  . Portacath placement  04/23/2011    Procedure: INSERTION PORT-A-CATH;  Surgeon: Pierre Bali, MD;  Location: [facility] OR;  Service: Thoracic;;  Revision of  Porta-Cath  . Fiberoptic bronchoscopy  12/11/2010  . Video bronchoscopy  09/27/2011    Procedure: VIDEO BRONCHOSCOPY;  Surgeon: Nicanor Alcon, MD;  Location: [place];  Service: Thoracic;  Laterality: N/A;  . Deviated septum repair    . Cardiac catheterization  2006   Social History:  reports that he quit smoking about 22 years ago. His smoking use included Cigarettes. He started smoking about 72 years ago. He has a 50  pack-year smoking history. He has never used smokeless tobacco. He reports that he does not drink alcohol or use illicit drugs.  No Known Allergies  Family History  Problem Relation Age of Onset  . Coronary artery disease    . Cancer    . Multiple sclerosis    . Diabetes type II    . Anesthesia problems Neg Hx   . Hypotension Neg Hx   . Malignant hyperthermia Neg Hx   . Pseudochol deficiency Neg Hx     Prior to Admission medications   Medication Sig Start Date End Date Taking? Authorizing Provider  amiodarone (PACERONE) 200 MG tablet Take 200 mg by mouth daily. 11/12/12  Yes Jacolyn Reedy, MD  apixaban (ELIQUIS) 5 MG TABS tablet Take 1 tablet (5 mg total) by mouth 2 (two) times daily. 11/12/12  Yes Jacolyn Reedy, MD  atorvastatin (LIPITOR) 20 MG tablet Take 1 tablet (20 mg total) by mouth daily. Patient taking differently: Take 20 mg by mouth at bedtime.  11/12/12  Yes Jacolyn Reedy, MD  Bismuth Subsalicylate (PEPTO-BISMOL PO) Take 2 tablets by mouth 3 (three) times daily as needed (nausea).   Yes Historical Provider, MD  cetirizine (ZYRTEC) 10 MG tablet Take 10 mg by mouth daily.    Yes Historical Provider, MD  diltiazem (CARDIZEM CD) 240 MG 24 hr capsule Take 1 capsule (240 mg total) by mouth daily. 11/12/12  Yes Jacolyn Reedy, MD  Exenatide ER (BYDUREON) 2 MG PEN Inject 2 mg into the skin once a week. On Mondays   Yes Historical Provider, MD  furosemide (LASIX) 40 MG tablet Take 1 tablet (40 mg total) by mouth daily. Patient taking differently: Take 40 mg by mouth See admin instructions. Take 1 tablet (40 mg) by mouth every morning, may take an additional tablet at 3pm if needed for weight gain of 3-4 lbs or shortness of breath 11/12/12  Yes Jacolyn Reedy, MD  glucosamine-chondroitin 500-400 MG tablet Take 1 tablet by mouth 2 (two) times daily.    Yes Historical Provider, MD  Insulin Detemir (LEVEMIR FLEXTOUCH) 100 UNIT/ML Pen Inject 12-16 Units into the skin 2 (two) times daily  as needed (CBG >100). If CBG >100 inject 16 units subcutaneously with breakfast and 12 units with supper   Yes Historical Provider, MD  metFORMIN (GLUCOPHAGE) 500 MG tablet Take 500 mg by mouth 2 (two) times daily.   Yes Historical Provider, MD  naproxen sodium (ALEVE) 220 MG tablet Take 220 mg by mouth daily as needed (pain).   Yes Historical Provider, MD  oxybutynin (DITROPAN-XL) 10 MG 24 hr tablet Take 10 mg by mouth daily.  12/19/14  Yes Historical Provider, MD  PROAIR HFA 108 (90 BASE) MCG/ACT inhaler INHALE TWO PUFFS INTO THE LUNGS EVERY 6 HOURS AS NEEDED FOR WHEEZING AND FOR SHORTNESS OF BREATH 10/25/14  Yes Collene Gobble, MD  Psyllium (METAMUCIL PO) Take 2 capsules by mouth at bedtime. For constipation   Yes  Historical Provider, MD  temazepam (RESTORIL) 15 MG capsule TAKE ONE CAPSULE BY MOUTH AT BEDTIME AS NEEDED FOR SLEEP Patient taking differently: TAKE ONE CAPSULE BY MOUTH DAILY AT BEDTIME 02/02/14  Yes Volanda Napoleon, MD  triamcinolone cream (KENALOG) 0.1 % Apply 1 application topically 2 (two) times daily as needed (contact dermatitis/ dry skin).  02/17/15  Yes Historical Provider, MD   Physical Exam: Filed Vitals:   05/07/15 1930 05/07/15 1945 05/07/15 2000 05/07/15 2015  BP: '94/58 97/60 96/57 '$ 93/61  Pulse: 81 78 82 81  Temp:      TempSrc:      Resp: '16  21 22  '$ Height:      Weight:      SpO2: 95% 95% 93% 93%    Wt Readings from Last 3 Encounters:  05/07/15 99.791 kg (220 lb)  02/08/15 102.513 kg (226 lb)  01/03/15 103.874 kg (229 lb)    General:  Appears calm and comfortable Eyes: PERRL, normal lids, irises & conjunctiva ENT: grossly normal hearing, lips & tongue Neck: no LAD, masses or thyromegaly Cardiovascular: RRR, positive systolic murmur. No LE edema. Telemetry: SR, no arrhythmias  Respiratory: Decreased breath sounds bilaterally. Bilateral wheezing, bilateral rhonchi. Abdomen: soft, ntnd Skin: Positive right arm and left knee lacerations Musculoskeletal:  grossly normal tone BUE/BLE Psychiatric: grossly normal mood and affect, speech fluent and appropriate Neurologic: grossly non-focal.          Labs on Admission:  Basic Metabolic Panel:  Recent Labs Lab 05/07/15 1735  NA 135  K 3.7  CL 101  CO2 24  GLUCOSE 138*  BUN 27*  CREATININE 1.75*  CALCIUM 9.0   Liver Function Tests:  Recent Labs Lab 05/07/15 1735  AST 90*  ALT 65*  ALKPHOS 95  BILITOT 0.6  PROT 6.6  ALBUMIN 3.1*   CBC:  Recent Labs Lab 05/07/15 1735  WBC 19.8*  NEUTROABS 16.5*  HGB 12.9*  HCT 38.4*  MCV 87.5  PLT 175   Cardiac Enzymes:  Recent Labs Lab 05/07/15 1735  TROPONINI 0.03    Radiological Exams on Admission: Dg Chest 2 View  05/07/2015  CLINICAL DATA:  81 year old male with shortness of breath. EXAM: CHEST  2 VIEW COMPARISON:  Chest x-ray 05/17/2014. FINDINGS: Mild emphysematous changes are noted throughout the lungs bilaterally. Mild diffuse peribronchial cuffing. Trace bilateral pleural effusions (right greater than left). No acute consolidative airspace disease. No evidence of pulmonary edema. Heart size is normal. Upper mediastinal contours are within normal limits. Atherosclerosis in the thoracic aorta. Left-sided subclavian single-lumen porta cath with tip terminating in the mid superior vena cava. IMPRESSION: 1. Mild diffuse peribronchial cuffing and emphysematous changes in the lungs. This may simply reflect underlying COPD, however, the possibility of an acute bronchitis is not excluded. Clinical correlation is recommended. 2. Trace bilateral pleural effusions (right greater than left). 3. Atherosclerosis. Electronically Signed   By: Vinnie Langton M.D.   On: 05/07/2015 16:43   Echocardiogram:  ------------------------------------------------------------ LV EF: 50% -  55%  ------------------------------------------------------------ Indications:   Atrial fibrillation -  427.31.  ------------------------------------------------------------ History:  PMH:  Dyspnea. Chronic obstructive pulmonary disease. Risk factors: Hypertension. Diabetes mellitus. Dyslipidemia.  ------------------------------------------------------------ Study Conclusions  - Left ventricle: The cavity size was normal. Systolic function was normal. The estimated ejection fraction was in the range of 50% to 55%. - Aortic valve: Trileaflet; normal thickness, mildly calcified leaflets. There was mild stenosis. Valve area: 1.45cm^2(VTI). Valve area: 1.54cm^2 (Vmax). - Aortic root: The aortic root was mildly dilated. -  Right ventricle: The cavity size was mildly dilated. Wall thickness was normal.  EKG: Independently reviewed. Vent. rate 86 BPM PR interval 152 ms QRS duration 110 ms QT/QTc 439/525 ms P-R-T axes 18 55 110 Sinus rhythm Low voltage, extremity and precordial leads Borderline repolarization abnormality Prolonged QT interval  Assessment/Plan Principal Problem:   CAP (community acquired pneumonia)   COPD (chronic obstructive pulmonary disease) (HCC) Admit to a stepdown for close monitoring. Continue limited and gentle hydration. Continue supplemental oxygen and bronchodilators. Continue low-dose IV methylprednisolone. Continue IV ceftriaxone and azithromycin. Follow-up sputum Gram stain, culture and sensitivity. Follow-up blood cultures.  Active Problems: Type 2 diabetes mellitus Hold metformin due to lactic acidosis. CBG monitoring and regular insulin sliding scale while the patient is in the hospital.    Hypertension  Continue Cardizem in the morning for atrial fibrillar rate control if blood pressure allows. Blood pressure monitoring.    Paroxysmal atrial fibrillation (HCC) Continue Cardizem in the morning if patient blood pressure numbers allow it. Continue daily amiodarone.    OSA on CPAP Continue CPAP ventilation while the patient's  in the hospital.    Lactic acidosis Hold metformin and follow-up lactic acid level.    Code Status: Full code. DVT Prophylaxis: On full anticoagulation. Family Communication: His daughter was by the bedside. Disposition Plan: Admit to the stepdown for close monitoring and IV antibiotics.  Time spent: Over 70 minutes were spent during the process of this admission including, but not limited to direct patient contact, review of chart, coordination of care, admission orders and documentation.   Reubin Milan [facility] Pager [phone].

## 2015-05-07 NOTE — ED Provider Notes (Signed)
CSN: [id number]     Arrival date & time 05/07/15  1437 History   First MD Initiated Contact with Patient 05/07/15 1639     Chief Complaint  Patient presents with  . Fatigue  . Chills     (Consider location/radiation/quality/duration/timing/severity/associated sxs/prior Treatment) Patient is a 81 y.o. male presenting with weakness.  Weakness This is a new problem. The current episode started 6 to 12 hours ago. The problem occurs constantly. The problem has been gradually improving. Associated symptoms include shortness of breath (chronic). Pertinent negatives include no abdominal pain and no headaches. Nothing aggravates the symptoms. Nothing relieves the symptoms. He has tried nothing for the symptoms. The treatment provided no relief.    Past Medical History  Diagnosis Date  . DVT (deep venous thrombosis) ([place])     IN RIGHT ARM 11/2010   . BPH (benign prostatic hyperplasia)   . COPD (chronic obstructive pulmonary disease) ([place])        . Hyperlipidemia   . Allergic rhinitis   . Allergic conjunctivitis   . DJD (degenerative joint disease)     Left Knee  . Pneumonia     history  . Diverticulosis     hx  . Cellulitis     hx  . Hearing loss   . Cataract   . Diabetic peripheral neuropathy ([place])   . Hypertension   . Obstructive sleep apnea (adult)  10/29/2012    This patient has mild obstructive sleep apnea, tested in March 2014 at [facility] sleep. He had been a CPAP user for 14 years. AHi 10.7 He was titrated to a pressure of 9 cm water( after auto - titration). CPAP at night  --- 8-10 YR AGO....,OSA-diagnosed 2000  . Secondary diabetes with peripheral neuropathy ([place]) 11/07/2012  . Acute diastolic CHF (congestive heart failure) ([place])   . Atrial fibrillation ([place])   . Lung cancer ([place])      history of stage IIIA non-small cell lung cancer who is currently being treated with both  Radiation and Chemotherapy   Past Surgical History  Procedure Laterality Date  . Insertion of a  left subclavian port-a-cath.  12/17/10    [place]  . Portacath placement  04/23/2011    Procedure: INSERTION PORT-A-CATH;  Surgeon: Pierre Bali, MD;  Location: [place];  Service: Thoracic;;  Revision of  Porta-Cath  . Fiberoptic bronchoscopy  12/11/2010  . Video bronchoscopy  09/27/2011    Procedure: VIDEO BRONCHOSCOPY;  Surgeon: Nicanor Alcon, MD;  Location: [place];  Service: Thoracic;  Laterality: N/A;  . Deviated septum repair    . Cardiac catheterization  2006   Family History  Problem Relation Age of Onset  . Coronary artery disease    . Cancer    . Multiple sclerosis    . Diabetes type II    . Anesthesia problems Neg Hx   . Hypotension Neg Hx   . Malignant hyperthermia Neg Hx   . Pseudochol deficiency Neg Hx    Social History  Substance Use Topics  . Smoking status: Former Smoker -- 1.00 packs/day for 50 years    Types: Cigarettes    Start date: 05/18/1943    Quit date: 06/10/1992  . Smokeless tobacco: Never Used     Comment: quit tobacco 3 years ago  . Alcohol Use: No    Review of Systems  Constitutional: Positive for chills, activity change, appetite change and fatigue. Negative for fever.  HENT: Negative for congestion.   Eyes: Negative for pain  and redness.  Respiratory: Positive for shortness of breath (chronic). Negative for choking.   Gastrointestinal: Negative for abdominal pain.  Endocrine: Negative for polydipsia and polyuria.  Genitourinary: Negative for dysuria and difficulty urinating.  Neurological: Positive for weakness. Negative for dizziness, light-headedness and headaches.  All other systems reviewed and are negative.     Allergies  Review of patient's allergies indicates no known allergies.  Home Medications   Prior to Admission medications   Medication Sig Start Date End Date Taking? Authorizing Provider  amiodarone (PACERONE) 200 MG tablet Take 200 mg by mouth daily. 11/12/12  Yes Jacolyn Reedy, MD  apixaban (ELIQUIS) 5 MG TABS tablet  Take 1 tablet (5 mg total) by mouth 2 (two) times daily. 11/12/12  Yes Jacolyn Reedy, MD  atorvastatin (LIPITOR) 20 MG tablet Take 1 tablet (20 mg total) by mouth daily. Patient taking differently: Take 20 mg by mouth at bedtime.  11/12/12  Yes Jacolyn Reedy, MD  Bismuth Subsalicylate (PEPTO-BISMOL PO) Take 2 tablets by mouth 3 (three) times daily as needed (nausea).   Yes Historical Provider, MD  cetirizine (ZYRTEC) 10 MG tablet Take 10 mg by mouth daily.    Yes Historical Provider, MD  diltiazem (CARDIZEM CD) 240 MG 24 hr capsule Take 1 capsule (240 mg total) by mouth daily. 11/12/12  Yes Jacolyn Reedy, MD  Exenatide ER (BYDUREON) 2 MG PEN Inject 2 mg into the skin once a week. On Mondays   Yes Historical Provider, MD  furosemide (LASIX) 40 MG tablet Take 1 tablet (40 mg total) by mouth daily. Patient taking differently: Take 40 mg by mouth See admin instructions. Take 1 tablet (40 mg) by mouth every morning, may take an additional tablet at 3pm if needed for weight gain of 3-4 lbs or shortness of breath 11/12/12  Yes Jacolyn Reedy, MD  glucosamine-chondroitin 500-400 MG tablet Take 1 tablet by mouth 2 (two) times daily.    Yes Historical Provider, MD  Insulin Detemir (LEVEMIR FLEXTOUCH) 100 UNIT/ML Pen Inject 12-16 Units into the skin 2 (two) times daily as needed (CBG >100). If CBG >100 inject 16 units subcutaneously with breakfast and 12 units with supper   Yes Historical Provider, MD  metFORMIN (GLUCOPHAGE) 500 MG tablet Take 500 mg by mouth 2 (two) times daily.   Yes Historical Provider, MD  naproxen sodium (ALEVE) 220 MG tablet Take 220 mg by mouth daily as needed (pain).   Yes Historical Provider, MD  oxybutynin (DITROPAN-XL) 10 MG 24 hr tablet Take 10 mg by mouth daily.  12/19/14  Yes Historical Provider, MD  PROAIR HFA 108 (90 BASE) MCG/ACT inhaler INHALE TWO PUFFS INTO THE LUNGS EVERY 6 HOURS AS NEEDED FOR WHEEZING AND FOR SHORTNESS OF BREATH 10/25/14  Yes Collene Gobble, MD  Psyllium  (METAMUCIL PO) Take 2 capsules by mouth at bedtime. For constipation   Yes Historical Provider, MD  temazepam (RESTORIL) 15 MG capsule TAKE ONE CAPSULE BY MOUTH AT BEDTIME AS NEEDED FOR SLEEP Patient taking differently: TAKE ONE CAPSULE BY MOUTH DAILY AT BEDTIME 02/02/14  Yes Volanda Napoleon, MD  triamcinolone cream (KENALOG) 0.1 % Apply 1 application topically 2 (two) times daily as needed (contact dermatitis/ dry skin).  02/17/15  Yes Historical Provider, MD   BP 96/57 mmHg  Pulse 82  Temp(Src) 101.2 F (38.4 C) (Core (Comment))  Resp 21  Ht '5\' 10"'  (1.778 m)  Wt 220 lb (99.791 kg)  BMI 31.57 kg/m2  SpO2 93% Physical  Exam  Constitutional: He is oriented to person, place, and time. He appears well-developed and well-nourished.  HENT:  Head: Normocephalic and atraumatic.  Eyes: Conjunctivae are normal. Pupils are equal, round, and reactive to light.  Neck: Normal range of motion.  Cardiovascular: Normal rate and regular rhythm.   Pulmonary/Chest: Effort normal. No respiratory distress. He has wheezes. He has rales.  Abdominal: Soft. He exhibits no distension. There is no tenderness. There is no rebound.  Genitourinary: Rectum normal. Guaiac negative stool. No penile tenderness.  Musculoskeletal: Normal range of motion. He exhibits tenderness (right cva).  Neurological: He is alert and oriented to person, place, and time.  Skin: Skin is warm and dry. No rash noted. No erythema.  Nursing note and vitals reviewed.   ED Course  Procedures (including critical care time)  CRITICAL CARE Performed by: Merrily Pew Total critical care time: 35 minutes Critical care time was exclusive of separately billable procedures and treating other patients. Critical care was necessary to treat or prevent imminent or life-threatening deterioration. Critical care was time spent personally by me on the following activities: development of treatment plan with patient and/or surrogate as well as nursing,  discussions with consultants, evaluation of patient's response to treatment, examination of patient, obtaining history from patient or surrogate, ordering and performing treatments and interventions, ordering and review of laboratory studies, ordering and review of radiographic studies, pulse oximetry and re-evaluation of patient's condition.   Labs Review Labs Reviewed  COMPREHENSIVE METABOLIC PANEL - Abnormal; Notable for the following:    Glucose, Bld 138 (*)    BUN 27 (*)    Creatinine, Ser 1.75 (*)    Albumin 3.1 (*)    AST 90 (*)    ALT 65 (*)    GFR calc non Af Amer 33 (*)    GFR calc Af Amer 38 (*)    All other components within normal limits  CBC WITH DIFFERENTIAL/PLATELET - Abnormal; Notable for the following:    WBC 19.8 (*)    Hemoglobin 12.9 (*)    HCT 38.4 (*)    Neutro Abs 16.5 (*)    Monocytes Absolute 1.6 (*)    All other components within normal limits  URINALYSIS, ROUTINE W REFLEX MICROSCOPIC (NOT AT [facility]) - Abnormal; Notable for the following:    Color, Urine AMBER (*)    Glucose, UA 100 (*)    All other components within normal limits  I-STAT CG4 LACTIC ACID, ED - Abnormal; Notable for the following:    Lactic Acid, Venous 2.58 (*)    All other components within normal limits  I-STAT CG4 LACTIC ACID, ED - Abnormal; Notable for the following:    Lactic Acid, Venous 2.06 (*)    All other components within normal limits  CULTURE, BLOOD (ROUTINE X 2)  CULTURE, BLOOD (ROUTINE X 2)  URINE CULTURE  TROPONIN I    Imaging Review Dg Chest 2 View  05/07/2015  CLINICAL DATA:  81 year old male with shortness of breath. EXAM: CHEST  2 VIEW COMPARISON:  Chest x-ray 05/17/2014. FINDINGS: Mild emphysematous changes are noted throughout the lungs bilaterally. Mild diffuse peribronchial cuffing. Trace bilateral pleural effusions (right greater than left). No acute consolidative airspace disease. No evidence of pulmonary edema. Heart size is normal. Upper mediastinal  contours are within normal limits. Atherosclerosis in the thoracic aorta. Left-sided subclavian single-lumen porta cath with tip terminating in the mid superior vena cava. IMPRESSION: 1. Mild diffuse peribronchial cuffing and emphysematous changes in the lungs. This may  simply reflect underlying COPD, however, the possibility of an acute bronchitis is not excluded. Clinical correlation is recommended. 2. Trace bilateral pleural effusions (right greater than left). 3. Atherosclerosis. Electronically Signed   By: Vinnie Langton M.D.   On: 05/07/2015 16:43   I have personally reviewed and evaluated these images and lab results as part of my medical decision-making.   EKG Interpretation   Date/Time:  Sunday May 07 2015 16:48:29 EST Ventricular Rate:  86 PR Interval:  152 QRS Duration: 110 QT Interval:  439 QTC Calculation: 525 R Axis:   55 Text Interpretation:  Sinus rhythm Low voltage, extremity and precordial  leads Borderline repolarization abnormality Prolonged QT interval  Confirmed by [facility] MD, Corene Cornea [phone]) on 05/07/2015 8:26:50 PM      MDM   Final diagnoses:  CAP (community acquired pneumonia)  Sepsis, due to unspecified organism ([place])  Prolonged Q-T interval on ECG   81 year old male history of COPD, CHF, atrial fibrillation, lung cancer, OSA here with 24 hours of worsening weakness, right ears cough and worsening shortness of breath. On exam patient rales or wheezing in bilateral lung fields slight increase his oxygen requirement tachypnea. Soft blood pressures as well. ECG with prolonged QT so magnesium given. Also breathing treatments and steroids. Has a history of CHF so initially 500 mL bolus was given. He has some right CVA tenderness to palpation concerning for possible pyelonephritis lungs his lung findings other concern was for pneumonia. Patient was evaluated appropriately had an abnormal white count of 19 also had some more soft pressures once his chest x-ray didn't  show frank pulmonary edema U liter bolus as he also had elevated lactic acid. Around this time was recommended the patient met criteria for sepsis so Rocephin and azithromycin were given for community acquired pneumonia after blood cultures were drawn. Discussed case with the hospitalist who will admit to stepdown for further evaluation and management.      Merrily Pew, MD 05/09/15 [phone]

## 2015-05-07 NOTE — ED Notes (Signed)
Pt c/o generalized weakness onset today, pt reports falling d/t weakness & his son had to come & help him up, pt reports having skin tear to R upper arm & L knee, pt denies LOC, pt c/o chills, pt c/o R mid back pain prior to falling, hx of lung CA, A&O x4, denies slurred speech & confusion, pt uses 3 L [place] at home

## 2015-05-07 NOTE — Progress Notes (Signed)
Report rec'd. Awaiting arrival.

## 2015-05-08 DIAGNOSIS — E1129 Type 2 diabetes mellitus with other diabetic kidney complication: Secondary | ICD-10-CM

## 2015-05-08 DIAGNOSIS — J189 Pneumonia, unspecified organism: Secondary | ICD-10-CM

## 2015-05-08 LAB — EXPECTORATED SPUTUM ASSESSMENT W GRAM STAIN, RFLX TO RESP C

## 2015-05-08 LAB — CBC WITH DIFFERENTIAL/PLATELET
BASOS ABS: 0 10*3/uL (ref 0.0–0.1)
Basophils Relative: 0 %
EOS PCT: 0 %
Eosinophils Absolute: 0 10*3/uL (ref 0.0–0.7)
HEMATOCRIT: 35.1 % — AB (ref 39.0–52.0)
Hemoglobin: 11.8 g/dL — ABNORMAL LOW (ref 13.0–17.0)
LYMPHS PCT: 5 %
Lymphs Abs: 0.9 10*3/uL (ref 0.7–4.0)
MCH: 29.4 pg (ref 26.0–34.0)
MCHC: 33.6 g/dL (ref 30.0–36.0)
MCV: 87.5 fL (ref 78.0–100.0)
MONO ABS: 0.6 10*3/uL (ref 0.1–1.0)
MONOS PCT: 3 %
NEUTROS ABS: 15.7 10*3/uL — AB (ref 1.7–7.7)
Neutrophils Relative %: 92 %
PLATELETS: 157 10*3/uL (ref 150–400)
RBC: 4.01 MIL/uL — ABNORMAL LOW (ref 4.22–5.81)
RDW: 15.4 % (ref 11.5–15.5)
WBC: 17.2 10*3/uL — ABNORMAL HIGH (ref 4.0–10.5)

## 2015-05-08 LAB — GLUCOSE, CAPILLARY
GLUCOSE-CAPILLARY: 231 mg/dL — AB (ref 65–99)
GLUCOSE-CAPILLARY: 183 mg/dL — AB (ref 65–99)
Glucose-Capillary: 309 mg/dL — ABNORMAL HIGH (ref 65–99)
Glucose-Capillary: 276 mg/dL — ABNORMAL HIGH (ref 65–99)

## 2015-05-08 LAB — INFLUENZA PANEL BY PCR (TYPE A & B)
H1N1FLUPCR: NOT DETECTED
INFLBPCR: NEGATIVE
Influenza A By PCR: NEGATIVE

## 2015-05-08 LAB — EXPECTORATED SPUTUM ASSESSMENT W REFEX TO RESP CULTURE

## 2015-05-08 LAB — COMPREHENSIVE METABOLIC PANEL
ALT: 54 U/L (ref 17–63)
ANION GAP: 10 (ref 5–15)
AST: 65 U/L — ABNORMAL HIGH (ref 15–41)
Albumin: 2.6 g/dL — ABNORMAL LOW (ref 3.5–5.0)
Alkaline Phosphatase: 77 U/L (ref 38–126)
BILIRUBIN TOTAL: 0.6 mg/dL (ref 0.3–1.2)
BUN: 26 mg/dL — ABNORMAL HIGH (ref 6–20)
CHLORIDE: 100 mmol/L — AB (ref 101–111)
CO2: 25 mmol/L (ref 22–32)
Calcium: 8.4 mg/dL — ABNORMAL LOW (ref 8.9–10.3)
Creatinine, Ser: 1.65 mg/dL — ABNORMAL HIGH (ref 0.61–1.24)
GFR, EST AFRICAN AMERICAN: 41 mL/min — AB (ref >60)
GFR, EST NON AFRICAN AMERICAN: 35 mL/min — AB (ref >60)
Glucose, Bld: 307 mg/dL — ABNORMAL HIGH (ref 65–99)
POTASSIUM: 4 mmol/L (ref 3.5–5.1)
Sodium: 135 mmol/L (ref 135–145)
TOTAL PROTEIN: 5.7 g/dL — AB (ref 6.5–8.1)

## 2015-05-08 LAB — STREP PNEUMONIAE URINARY ANTIGEN: STREP PNEUMO URINARY ANTIGEN: NEGATIVE

## 2015-05-08 LAB — LACTIC ACID, PLASMA: LACTIC ACID, VENOUS: 3.1 mmol/L — AB (ref 0.5–2.0)

## 2015-05-08 LAB — URINE CULTURE: CULTURE: NO GROWTH

## 2015-05-08 LAB — MRSA PCR SCREENING: MRSA BY PCR: POSITIVE — AB

## 2015-05-08 MED ORDER — INSULIN ASPART 100 UNIT/ML ~~LOC~~ SOLN
0.0000 [IU] | Freq: Three times a day (TID) | SUBCUTANEOUS | Status: DC
  Administered 2015-05-08: 11 [IU] via SUBCUTANEOUS
  Administered 2015-05-08: 7 [IU] via SUBCUTANEOUS
  Administered 2015-05-09: 4 [IU] via SUBCUTANEOUS
  Administered 2015-05-09 – 2015-05-10 (×4): 3 [IU] via SUBCUTANEOUS
  Administered 2015-05-10: 11 [IU] via SUBCUTANEOUS
  Administered 2015-05-11 (×2): 7 [IU] via SUBCUTANEOUS

## 2015-05-08 MED ORDER — LORATADINE 10 MG PO TABS: 10.0000 mg | ORAL_TABLET | Freq: Every day | ORAL | Status: DC

## 2015-05-08 MED ORDER — DILTIAZEM HCL ER COATED BEADS 120 MG PO CP24
120.0000 mg | ORAL_CAPSULE | Freq: Two times a day (BID) | ORAL | Status: DC
  Administered 2015-05-08 – 2015-05-11 (×6): 120 mg via ORAL
  Filled 2015-05-08 (×8): qty 1

## 2015-05-08 MED ORDER — SODIUM CHLORIDE 0.9 % IV BOLUS (SEPSIS)
500.0000 mL | Freq: Once | INTRAVENOUS | Status: AC
  Administered 2015-05-08: 500 mL via INTRAVENOUS

## 2015-05-08 MED ORDER — CHLORHEXIDINE GLUCONATE CLOTH 2 % EX PADS
6.0000 | MEDICATED_PAD | Freq: Every day | CUTANEOUS | Status: DC
  Administered 2015-05-08 – 2015-05-11 (×4): 6 via TOPICAL

## 2015-05-08 MED ORDER — SODIUM CHLORIDE 0.9 % IV BOLUS (SEPSIS): 500.0000 mL | INTRAVENOUS | Status: DC | PRN

## 2015-05-08 MED ORDER — SODIUM CHLORIDE 0.9 % IV SOLN
INTRAVENOUS | Status: DC
  Administered 2015-05-08 – 2015-05-09 (×2): via INTRAVENOUS

## 2015-05-08 MED ORDER — SODIUM CHLORIDE 0.9 % IV BOLUS (SEPSIS): 1000.0000 mL | Freq: Once | INTRAVENOUS | Status: DC

## 2015-05-08 MED ORDER — MUPIROCIN 2 % EX OINT
1.0000 "application " | TOPICAL_OINTMENT | Freq: Two times a day (BID) | CUTANEOUS | Status: DC
  Administered 2015-05-08 – 2015-05-11 (×8): 1 via NASAL
  Filled 2015-05-08 (×6): qty 22

## 2015-05-08 MED ORDER — INSULIN ASPART 100 UNIT/ML ~~LOC~~ SOLN
0.0000 [IU] | Freq: Every day | SUBCUTANEOUS | Status: DC
  Administered 2015-05-10: 3 [IU] via SUBCUTANEOUS

## 2015-05-08 MED ORDER — SALINE SPRAY 0.65 % NA SOLN
1.0000 | NASAL | Status: DC | PRN
  Filled 2015-05-08 (×2): qty 44

## 2015-05-08 NOTE — Progress Notes (Signed)
Patient Demographics:    [name], is a 81 y.o. male, DOB - 1924-06-26, ZWC:[id number]  Admit date - 05/07/2015   Admitting Physician Reubin Milan, MD  Outpatient Primary MD for the patient is Tivis Ringer, MD  LOS - 1   Chief Complaint  Patient presents with  . Fatigue  . Chills        Subjective:    [name] today has, No headache, No chest pain, No abdominal pain - No Nausea, No new weakness tingling or numbness, improved Cough - SOB.     Assessment  & Plan :     1. Sepsis with Acute on chronic hypoxic respiratory failure due to early CAP. Rule out influenza, continue empiric antibiotics, continue oxygen and nebulizer treatments, note he uses 2 L his attention oxygen at home at all times. Monitor cultures and influenza panel. Continue gentle hydration.   2. History of paroxysmal atrial fibrillation with [place] VASC 2 score of over 3. On Cardizem and Eliquis continue goal will be controlled.   3. Obstructive sleep apnea. CPAP at night.   4. History of COPD. On home oxygen, currently no wheezing, stop steroids, continue supportive care.   5. DM type II. On Levemir, adjusted sliding scale monitor.    Code Status : Full  Family Communication  : Daughter  Disposition Plan  : TBD, PT eval  Consults  :     Procedures  :    DVT Prophylaxis  :  Eliquis  Lab Results  Component Value Date   PLT 157 05/08/2015    Inpatient Medications  Scheduled Meds: . amiodarone  200 mg Oral Daily  . apixaban  5 mg Oral BID  . atorvastatin  20 mg Oral QHS  . azithromycin (ZITHROMAX) 500 MG IVPB  500 mg Intravenous Q24H  . cefTRIAXone (ROCEPHIN) IVPB 1 gram/50 mL D5W  1 g Intravenous Q24H  . Chlorhexidine Gluconate Cloth  6 each Topical Q0600  . diltiazem  240 mg Oral Daily    . insulin aspart  0-20 Units Subcutaneous TID WC  . insulin aspart  0-5 Units Subcutaneous QHS  . insulin detemir  12 Units Subcutaneous Q supper  . insulin detemir  16 Units Subcutaneous Q breakfast  . ipratropium  0.5 mg Nebulization Q6H  . levalbuterol  1.25 mg Nebulization 4 times per day  . loratadine  10 mg Oral Daily  . methylPREDNISolone (SOLU-MEDROL) injection  40 mg Intravenous Q6H  . mupirocin ointment  1 application Nasal BID  . oxybutynin  10 mg Oral Daily   Continuous Infusions: . sodium chloride     PRN Meds:.ondansetron **OR** ondansetron (ZOFRAN) IV, sodium chloride, temazepam  Antibiotics  :     Anti-infectives    Start     Dose/Rate Route Frequency Ordered Stop   05/08/15 2000  azithromycin (ZITHROMAX) 500 mg in dextrose 5 % 250 mL IVPB     500 mg 250 mL/hr over 60 Minutes Intravenous Every 24 hours 05/07/15 2206     05/08/15 1930  cefTRIAXone (ROCEPHIN) 1 g in dextrose 5 % 50 mL IVPB     1 g 100 mL/hr over 30 Minutes Intravenous Every 24 hours 05/07/15 2206     05/07/15 1930  cefTRIAXone (ROCEPHIN)  1 g in dextrose 5 % 50 mL IVPB     1 g 100 mL/hr over 30 Minutes Intravenous  Once 05/07/15 1919 05/07/15 1957   05/07/15 1930  azithromycin (ZITHROMAX) 500 mg in dextrose 5 % 250 mL IVPB     500 mg 250 mL/hr over 60 Minutes Intravenous  Once 05/07/15 1919 05/07/15 2106        Objective:   Filed Vitals:   05/07/15 2147 05/07/15 2330 05/08/15 0730 05/08/15 0856  BP: '97/58 92/70 96/64 '$   Pulse: 73 72 75   Temp: 99.1 F (37.3 C) 98.4 F (36.9 C) 98.6 F (37 C)   TempSrc: Axillary Rectal Axillary   Resp: '25 16 16   '$ Height: '5\' 10"'$  (1.778 m)     Weight: 101.5 kg (223 lb 12.3 oz)     SpO2: 95% 92% 98% 93%    Wt Readings from Last 3 Encounters:  05/07/15 101.5 kg (223 lb 12.3 oz)  02/08/15 102.513 kg (226 lb)  01/03/15 103.874 kg (229 lb)     Intake/Output Summary (Last 24 hours) at 05/08/15 0954 Last data filed at 05/08/15 0919  Gross per 24 hour   Intake   1455 ml  Output      0 ml  Net   1455 ml     Physical Exam  Awake Alert, Oriented X 3, No new F.N deficits, Normal affect [place].AT,PERRAL Supple Neck,No JVD, No cervical lymphadenopathy appriciated.  Symmetrical Chest wall movement, Good air movement bilaterally, CTAB RRR,No Gallops,Rubs or new Murmurs, No Parasternal Heave +ve B.Sounds, Abd Soft, No tenderness, No organomegaly appriciated, No rebound - guarding or rigidity. No Cyanosis, Clubbing or edema, No new Rash or bruise      Data Review:   Micro Results Recent Results (from the past 240 hour(s))  Culture, blood (routine x 2)     Status: None (Preliminary result)   Collection Time: 05/07/15  6:28 PM  Result Value Ref Range Status   Specimen Description BLOOD RIGHT ANTECUBITAL  Final   Special Requests BOTTLES DRAWN AEROBIC AND ANAEROBIC 5CC  Final   Culture PENDING  Incomplete   Report Status PENDING  Incomplete  MRSA PCR Screening     Status: Abnormal   Collection Time: 05/07/15 11:25 PM  Result Value Ref Range Status   MRSA by PCR POSITIVE (A) NEGATIVE Final    Comment:        The GeneXpert MRSA Assay (FDA approved for NASAL specimens only), is one component of a comprehensive MRSA colonization surveillance program. It is not intended to diagnose MRSA infection nor to guide or monitor treatment for MRSA infections. RESULT CALLED TO, READ BACK BY AND VERIFIED WITH: RN Dolan Amen [phone] '@[id number]'$   [facility]     Radiology Reports Dg Chest 2 View  05/07/2015  CLINICAL DATA:  81 year old male with shortness of breath. EXAM: CHEST  2 VIEW COMPARISON:  Chest x-ray 05/17/2014. FINDINGS: Mild emphysematous changes are noted throughout the lungs bilaterally. Mild diffuse peribronchial cuffing. Trace bilateral pleural effusions (right greater than left). No acute consolidative airspace disease. No evidence of pulmonary edema. Heart size is normal. Upper mediastinal contours are within normal limits.  Atherosclerosis in the thoracic aorta. Left-sided subclavian single-lumen porta cath with tip terminating in the mid superior vena cava. IMPRESSION: 1. Mild diffuse peribronchial cuffing and emphysematous changes in the lungs. This may simply reflect underlying COPD, however, the possibility of an acute bronchitis is not excluded. Clinical correlation is recommended. 2. Trace bilateral pleural effusions (right  greater than left). 3. Atherosclerosis. Electronically Signed   By: Vinnie Langton M.D.   On: 05/07/2015 16:43     CBC  Recent Labs Lab 05/07/15 1735 05/08/15 0211  WBC 19.8* 17.2*  HGB 12.9* 11.8*  HCT 38.4* 35.1*  PLT 175 157  MCV 87.5 87.5  MCH 29.4 29.4  MCHC 33.6 33.6  RDW 15.3 15.4  LYMPHSABS 1.6 0.9  MONOABS 1.6* 0.6  EOSABS 0.0 0.0  BASOSABS 0.0 0.0    Chemistries   Recent Labs Lab 05/07/15 1735 05/08/15 0211  NA 135 135  K 3.7 4.0  CL 101 100*  CO2 24 25  GLUCOSE 138* 307*  BUN 27* 26*  CREATININE 1.75* 1.65*  CALCIUM 9.0 8.4*  AST 90* 65*  ALT 65* 54  ALKPHOS 95 77  BILITOT 0.6 0.6   ------------------------------------------------------------------------------------------------------------------ estimated creatinine clearance is 36.9 mL/min (by C-G formula based on Cr of 1.65). ------------------------------------------------------------------------------------------------------------------ No results for input(s): HGBA1C in the last 72 hours. ------------------------------------------------------------------------------------------------------------------ No results for input(s): CHOL, HDL, LDLCALC, TRIG, CHOLHDL, LDLDIRECT in the last 72 hours. ------------------------------------------------------------------------------------------------------------------ No results for input(s): TSH, T4TOTAL, T3FREE, THYROIDAB in the last 72 hours.  Invalid input(s):  FREET3 ------------------------------------------------------------------------------------------------------------------ No results for input(s): VITAMINB12, FOLATE, FERRITIN, TIBC, IRON, RETICCTPCT in the last 72 hours.  Coagulation profile No results for input(s): INR, PROTIME in the last 168 hours.  No results for input(s): DDIMER in the last 72 hours.  Cardiac Enzymes  Recent Labs Lab 05/07/15 1735  TROPONINI 0.03   ------------------------------------------------------------------------------------------------------------------ Invalid input(s): POCBNP   Time Spent in minutes  35   Renezmae Canlas K M.D on 05/08/2015 at 9:54 AM  Between 7am to 7pm - Pager - [phone]  After 7pm go to [email or web address] - password [facility]  [facility] -  Office  [phone]

## 2015-05-08 NOTE — Discharge Instructions (Addendum)
Follow with Primary MD Tivis Ringer, MD in 7 days   Get CBC, CMP, 2 view Chest X ray checked  by Primary MD next visit.    Activity: As tolerated with Full fall precautions use walker/cane & assistance as needed   Disposition Home     Diet: Heart Healthy - Low Carb. Check your Weight same time everyday, if you gain over 2 pounds, or you develop in leg swelling, experience more shortness of breath or chest pain, call your Primary MD immediately. Follow Cardiac Low Salt Diet and 1.5 lit/day fluid restriction.   On your next visit with your primary care physician please Get Medicines reviewed and adjusted.   Please request your Prim.MD to go over all Hospital Tests and Procedure/Radiological results at the follow up, please get all Hospital records sent to your Prim MD by signing hospital release before you go home.   If you experience worsening of your admission symptoms, develop shortness of breath, life threatening emergency, suicidal or homicidal thoughts you must seek medical attention immediately by calling 911 or calling your MD immediately  if symptoms less severe.  You Must read complete instructions/literature along with all the possible adverse reactions/side effects for all the Medicines you take and that have been prescribed to you. Take any new Medicines after you have completely understood and accpet all the possible adverse reactions/side effects.   Do not drive, operating heavy machinery, perform activities at heights, swimming or participation in water activities or provide baby sitting services if your were admitted for syncope or siezures until you have seen by Primary MD or a Neurologist and advised to do so again.  Do not drive when taking Pain medications.    Do not take more than prescribed Pain, Sleep and Anxiety Medications  Special Instructions: If you have smoked or chewed Tobacco  in the last 2 yrs please stop smoking, stop any regular Alcohol  and or any  Recreational drug use.  Wear Seat belts while driving.   Please note  You were cared for by a hospitalist during your hospital stay. If you have any questions about your discharge medications or the care you received while you were in the hospital after you are discharged, you can call the unit and asked to speak with the hospitalist on call if the hospitalist that took care of you is not available. Once you are discharged, your primary care physician will handle any further medical issues. Please note that NO REFILLS for any discharge medications will be authorized once you are discharged, as it is imperative that you return to your primary care physician (or establish a relationship with a primary care physician if you do not have one) for your aftercare needs so that they can reassess your need for medications and monitor your lab values.

## 2015-05-08 NOTE — Progress Notes (Signed)
Utilization Review Completed.Donne Anon T11/28/2016

## 2015-05-08 NOTE — Progress Notes (Signed)
Sputum cup at bedside for sample to be collected

## 2015-05-09 LAB — CBC
HCT: 34.2 % — ABNORMAL LOW (ref 39.0–52.0)
Hemoglobin: 11.1 g/dL — ABNORMAL LOW (ref 13.0–17.0)
MCH: 28.5 pg (ref 26.0–34.0)
MCHC: 32.5 g/dL (ref 30.0–36.0)
MCV: 87.7 fL (ref 78.0–100.0)
PLATELETS: 163 10*3/uL (ref 150–400)
RBC: 3.9 MIL/uL — ABNORMAL LOW (ref 4.22–5.81)
RDW: 15.4 % (ref 11.5–15.5)
WBC: 15.4 10*3/uL — ABNORMAL HIGH (ref 4.0–10.5)

## 2015-05-09 LAB — BASIC METABOLIC PANEL
Anion gap: 5 (ref 5–15)
BUN: 22 mg/dL — AB (ref 6–20)
CO2: 28 mmol/L (ref 22–32)
CREATININE: 1.29 mg/dL — AB (ref 0.61–1.24)
Calcium: 8.5 mg/dL — ABNORMAL LOW (ref 8.9–10.3)
Chloride: 105 mmol/L (ref 101–111)
GFR calc Af Amer: 55 mL/min — ABNORMAL LOW (ref >60)
GFR, EST NON AFRICAN AMERICAN: 48 mL/min — AB (ref >60)
Glucose, Bld: 113 mg/dL — ABNORMAL HIGH (ref 65–99)
Potassium: 4.2 mmol/L (ref 3.5–5.1)
SODIUM: 138 mmol/L (ref 135–145)

## 2015-05-09 LAB — GLUCOSE, CAPILLARY
GLUCOSE-CAPILLARY: 141 mg/dL — AB (ref 65–99)
Glucose-Capillary: 169 mg/dL — ABNORMAL HIGH (ref 65–99)
Glucose-Capillary: 142 mg/dL — ABNORMAL HIGH (ref 65–99)
Glucose-Capillary: 121 mg/dL — ABNORMAL HIGH (ref 65–99)

## 2015-05-09 LAB — HEMOGLOBIN A1C
Hgb A1c MFr Bld: 7 % — ABNORMAL HIGH (ref 4.8–5.6)
MEAN PLASMA GLUCOSE: 154 mg/dL

## 2015-05-09 LAB — LEGIONELLA PNEUMOPHILA SEROGP 1 UR AG: L. pneumophila Serogp 1 Ur Ag: NEGATIVE

## 2015-05-09 MED ORDER — AZITHROMYCIN 500 MG PO TABS
500.0000 mg | ORAL_TABLET | Freq: Every day | ORAL | Status: DC
  Administered 2015-05-09 – 2015-05-10 (×2): 500 mg via ORAL
  Filled 2015-05-09 (×2): qty 1

## 2015-05-09 MED ORDER — FUROSEMIDE 40 MG PO TABS
20.0000 mg | ORAL_TABLET | Freq: Once | ORAL | Status: AC
  Administered 2015-05-09: 20 mg via ORAL
  Filled 2015-05-09: qty 1

## 2015-05-09 MED ORDER — TEMAZEPAM 7.5 MG PO CAPS
15.0000 mg | ORAL_CAPSULE | Freq: Every evening | ORAL | Status: DC | PRN
  Administered 2015-05-09 – 2015-05-10 (×3): 15 mg via ORAL
  Filled 2015-05-09: qty 2
  Filled 2015-05-09: qty 1
  Filled 2015-05-09: qty 2

## 2015-05-09 NOTE — Progress Notes (Signed)
Patient Demographics:    [name], is a 81 y.o. male, DOB - Oct 25, 1924, IPJ:[id number]  Admit date - 05/07/2015   Admitting Physician Reubin Milan, MD  Outpatient Primary MD for the patient is Tivis Ringer, MD  LOS - 2   Chief Complaint  Patient presents with  . Fatigue  . Chills        Subjective:    [name] today has, No headache, No chest pain, No abdominal pain - No Nausea, No new weakness tingling or numbness, improved Cough - SOB.  Feels better overall.   Assessment  & Plan :     1. Sepsis with Acute on chronic hypoxic respiratory failure due to early CAP. Rule out influenza, continue empiric antibiotics, continue oxygen and nebulizer treatments, note he uses 2 L his attention oxygen at home at all times. Monitor cultures , negative influenza panel. Sepsis resolved after hydration.   2. History of paroxysmal atrial fibrillation with [place] VASC 2 score of over 3. On Cardizem and Eliquis continue, goal will be controlled.   3. Obstructive sleep apnea. CPAP at night.   4. History of COPD. On home oxygen, currently no wheezing, stopped steroids, continue supportive care.   5. DM type II. On Levemir, adjusted sliding scale monitor.  Lab Results  Component Value Date   HGBA1C 7.0* 05/08/2015    CBG (last 3)   Recent Labs  05/08/15 1718 05/08/15 2200 05/09/15 0829  GLUCAP 231* 183* 141*      Code Status : Full  Family Communication  : Daughter  Disposition Plan  : TBD, PT eval  Consults  :     Procedures  :    DVT Prophylaxis  :  Eliquis  Lab Results  Component Value Date   PLT 163 05/09/2015    Inpatient Medications  Scheduled Meds: . amiodarone  200 mg Oral Daily  . apixaban  5 mg Oral BID  . atorvastatin  20 mg Oral QHS  . azithromycin  (ZITHROMAX) 500 MG IVPB  500 mg Intravenous Q24H  . cefTRIAXone (ROCEPHIN) IVPB 1 gram/50 mL D5W  1 g Intravenous Q24H  . Chlorhexidine Gluconate Cloth  6 each Topical Q0600  . diltiazem  120 mg Oral BID  . furosemide  20 mg Oral Once  . insulin aspart  0-20 Units Subcutaneous TID WC  . insulin aspart  0-5 Units Subcutaneous QHS  . insulin detemir  12 Units Subcutaneous Q supper  . insulin detemir  16 Units Subcutaneous Q breakfast  . ipratropium  0.5 mg Nebulization Q6H  . levalbuterol  1.25 mg Nebulization 4 times per day  . loratadine  10 mg Oral Daily  . mupirocin ointment  1 application Nasal BID  . oxybutynin  10 mg Oral Daily   Continuous Infusions:   PRN Meds:.[DISCONTINUED] ondansetron **OR** ondansetron (ZOFRAN) IV, sodium chloride, sodium chloride, temazepam  Antibiotics  :     Anti-infectives    Start     Dose/Rate Route Frequency Ordered Stop   05/08/15 2000  azithromycin (ZITHROMAX) 500 mg in dextrose 5 % 250 mL IVPB     500 mg 250 mL/hr over 60 Minutes Intravenous Every 24 hours 05/07/15 2206     05/08/15 1930  cefTRIAXone (ROCEPHIN)  1 g in dextrose 5 % 50 mL IVPB     1 g 100 mL/hr over 30 Minutes Intravenous Every 24 hours 05/07/15 2206     05/07/15 1930  cefTRIAXone (ROCEPHIN) 1 g in dextrose 5 % 50 mL IVPB     1 g 100 mL/hr over 30 Minutes Intravenous  Once 05/07/15 1919 05/07/15 1957   05/07/15 1930  azithromycin (ZITHROMAX) 500 mg in dextrose 5 % 250 mL IVPB     500 mg 250 mL/hr over 60 Minutes Intravenous  Once 05/07/15 1919 05/07/15 2106        Objective:   Filed Vitals:   05/09/15 0445 05/09/15 0500 05/09/15 0823 05/09/15 0914  BP: 120/107 117/77  117/52  Pulse: 67 63  71  Temp: 98.4 F (36.9 C)   97.7 F (36.5 C)  TempSrc: Oral   Oral  Resp:  21  20  Height:      Weight:      SpO2: 99% 100% 99% 92%    Wt Readings from Last 3 Encounters:  05/07/15 101.5 kg (223 lb 12.3 oz)  02/08/15 102.513 kg (226 lb)  01/03/15 103.874 kg (229 lb)      Intake/Output Summary (Last 24 hours) at 05/09/15 0944 Last data filed at 05/09/15 0600  Gross per 24 hour  Intake 1653.75 ml  Output    250 ml  Net 1403.75 ml     Physical Exam  Awake Alert, Oriented X 3, No new F.N deficits, Normal affect [place].AT,PERRAL Supple Neck,No JVD, No cervical lymphadenopathy appriciated.  Symmetrical Chest wall movement, Good air movement bilaterally, minimal bibasilar rales RRR,No Gallops,Rubs or new Murmurs, No Parasternal Heave +ve B.Sounds, Abd Soft, No tenderness, No organomegaly appriciated, No rebound - guarding or rigidity. No Cyanosis, Clubbing or edema, No new Rash or bruise      Data Review:   Micro Results Recent Results (from the past 240 hour(s))  Culture, blood (routine x 2)     Status: None (Preliminary result)   Collection Time: 05/07/15  5:35 PM  Result Value Ref Range Status   Specimen Description BLOOD PORTA CATH  Final   Special Requests BOTTLES DRAWN AEROBIC AND ANAEROBIC 5CC  Final   Culture NO GROWTH < 24 HOURS  Final   Report Status PENDING  Incomplete  Urine culture     Status: None   Collection Time: 05/07/15  5:52 PM  Result Value Ref Range Status   Specimen Description URINE, CLEAN CATCH  Final   Special Requests NONE  Final   Culture NO GROWTH 1 DAY  Final   Report Status 05/08/2015 FINAL  Final  Culture, blood (routine x 2)     Status: None (Preliminary result)   Collection Time: 05/07/15  6:28 PM  Result Value Ref Range Status   Specimen Description BLOOD RIGHT ANTECUBITAL  Final   Special Requests BOTTLES DRAWN AEROBIC AND ANAEROBIC 5CC  Final   Culture NO GROWTH < 24 HOURS  Final   Report Status PENDING  Incomplete  MRSA PCR Screening     Status: Abnormal   Collection Time: 05/07/15 11:25 PM  Result Value Ref Range Status   MRSA by PCR POSITIVE (A) NEGATIVE Final    Comment:        The GeneXpert MRSA Assay (FDA approved for NASAL specimens only), is one component of a comprehensive MRSA  colonization surveillance program. It is not intended to diagnose MRSA infection nor to guide or monitor treatment for MRSA infections. RESULT CALLED  TO, READ BACK BY AND VERIFIED WITH: RN Dolan Amen [phone] '@[phone]'$   THANEY   Culture, sputum-assessment     Status: None   Collection Time: 05/08/15  8:00 AM  Result Value Ref Range Status   Specimen Description SPUTUM  Final   Special Requests NONE  Final   Sputum evaluation   Final    THIS SPECIMEN IS ACCEPTABLE. RESPIRATORY CULTURE REPORT TO FOLLOW.   Report Status 05/08/2015 FINAL  Final  Culture, respiratory (NON-Expectorated)     Status: None (Preliminary result)   Collection Time: 05/08/15  8:00 AM  Result Value Ref Range Status   Specimen Description SPUTUM  Final   Special Requests NONE  Final   Gram Stain   Final    ABUNDANT WBC PRESENT,BOTH PMN AND MONONUCLEAR RARE SQUAMOUS EPITHELIAL CELLS PRESENT ABUNDANT GRAM POSITIVE COCCI IN PAIRS FEW GRAM POSITIVE RODS Performed at [facility]    Culture   Final    Culture reincubated for better growth Performed at [facility]    Report Status PENDING  Incomplete    Radiology Reports Dg Chest 2 View  05/07/2015  CLINICAL DATA:  81 year old male with shortness of breath. EXAM: CHEST  2 VIEW COMPARISON:  Chest x-ray 05/17/2014. FINDINGS: Mild emphysematous changes are noted throughout the lungs bilaterally. Mild diffuse peribronchial cuffing. Trace bilateral pleural effusions (right greater than left). No acute consolidative airspace disease. No evidence of pulmonary edema. Heart size is normal. Upper mediastinal contours are within normal limits. Atherosclerosis in the thoracic aorta. Left-sided subclavian single-lumen porta cath with tip terminating in the mid superior vena cava. IMPRESSION: 1. Mild diffuse peribronchial cuffing and emphysematous changes in the lungs. This may simply reflect underlying COPD, however, the possibility of an acute bronchitis is not  excluded. Clinical correlation is recommended. 2. Trace bilateral pleural effusions (right greater than left). 3. Atherosclerosis. Electronically Signed   By: Vinnie Langton M.D.   On: 05/07/2015 16:43     CBC  Recent Labs Lab 05/07/15 1735 05/08/15 0211 05/09/15 0459  WBC 19.8* 17.2* 15.4*  HGB 12.9* 11.8* 11.1*  HCT 38.4* 35.1* 34.2*  PLT 175 157 163  MCV 87.5 87.5 87.7  MCH 29.4 29.4 28.5  MCHC 33.6 33.6 32.5  RDW 15.3 15.4 15.4  LYMPHSABS 1.6 0.9  --   MONOABS 1.6* 0.6  --   EOSABS 0.0 0.0  --   BASOSABS 0.0 0.0  --     Chemistries   Recent Labs Lab 05/07/15 1735 05/08/15 0211 05/09/15 0459  NA 135 135 138  K 3.7 4.0 4.2  CL 101 100* 105  CO2 '24 25 28  '$ GLUCOSE 138* 307* 113*  BUN 27* 26* 22*  CREATININE 1.75* 1.65* 1.29*  CALCIUM 9.0 8.4* 8.5*  AST 90* 65*  --   ALT 65* 54  --   ALKPHOS 95 77  --   BILITOT 0.6 0.6  --    ------------------------------------------------------------------------------------------------------------------ estimated creatinine clearance is 47.3 mL/min (by C-G formula based on Cr of 1.29). ------------------------------------------------------------------------------------------------------------------  Recent Labs  05/08/15 0211  HGBA1C 7.0*   ------------------------------------------------------------------------------------------------------------------ No results for input(s): CHOL, HDL, LDLCALC, TRIG, CHOLHDL, LDLDIRECT in the last 72 hours. ------------------------------------------------------------------------------------------------------------------ No results for input(s): TSH, T4TOTAL, T3FREE, THYROIDAB in the last 72 hours.  Invalid input(s): FREET3 ------------------------------------------------------------------------------------------------------------------ No results for input(s): VITAMINB12, FOLATE, FERRITIN, TIBC, IRON, RETICCTPCT in the last 72 hours.  Coagulation profile No results for input(s):  INR, PROTIME in the last 168 hours.  No results for input(s): DDIMER  in the last 72 hours.  Cardiac Enzymes  Recent Labs Lab 05/07/15 1735  TROPONINI 0.03   ------------------------------------------------------------------------------------------------------------------ Invalid input(s): POCBNP   Time Spent in minutes  35   Kizzie Cotten K M.D on 05/09/2015 at 9:44 AM  Between 7am to 7pm - Pager - [phone]  After 7pm go to [email or web address] - password [facility]  [facility] -  Office  [phone]

## 2015-05-09 NOTE — Evaluation (Signed)
Physical Therapy Evaluation Patient Details Name: [name] MRN: [id number] DOB: Apr 27, 1925 Today's Date: 05/09/2015   History of Present Illness  [name] is a 81 y.o. male with a past medical history of COPD, lung cancer, previous pneumonia, hyperlipidemia, paroxysmal atrial fibrillation, diastolic CHF, hypertension, DVT who comes with a history of shortness of breath associated with wheezing, productive cough, pleuritic chest pain, weakness, fever, chills, fatigue since earlier today.   Clinical Impression  Pt admitted with above diagnosis. Pt currently with functional limitations due to the deficits listed below (see PT Problem List). Pt ambulated in hallway but with some balance deficits as well as deconditioned.  Will benefit from short term SNF.  Pt and daughter aware of options of SNF vs HH and they are trying to decide.  This PT feels that SNF best and told the pt and family.  If home, will need maximal services as below and equipment as below.   Pt will benefit from skilled PT to increase their independence and safety with mobility to allow discharge to the venue listed below.      Follow Up Recommendations SNF;Supervision/Assistance - 24 hour (HHPT, HHOT and HHaide if pt decides to go home)    Equipment Recommendations  Rolling walker with 5" wheels;3in1 (PT)    Recommendations for Other Services       Precautions / Restrictions Precautions Precautions: Fall Restrictions Weight Bearing Restrictions: No      Mobility  Bed Mobility Overal bed mobility: Needs Assistance Bed Mobility: Supine to Sit     Supine to sit: Min guard        Transfers Overall transfer level: Needs assistance Equipment used: Rolling walker (2 wheeled) Transfers: Sit to/from Stand Sit to Stand: Min assist         General transfer comment: Needed steadying assist and cues for hand placement  Ambulation/Gait Ambulation/Gait assistance: Min guard Ambulation Distance (Feet): 100  Feet Assistive device: Rolling walker (2 wheeled) Gait Pattern/deviations: Step-through pattern;Decreased stride length   Gait velocity interpretation: Below normal speed for age/gender General Gait Details: Overall, good safety with RW.  A few cues to stay close to RW and cues for pursed lip breathing as well.    Stairs            Wheelchair Mobility    Modified Rankin (Stroke Patients Only)       Balance Overall balance assessment: Needs assistance Sitting-balance support: No upper extremity supported;Feet supported Sitting balance-Leahy Scale: Fair     Standing balance support: Bilateral upper extremity supported;During functional activity Standing balance-Leahy Scale: Poor Standing balance comment: relies on RW for balance.  When pt took hands off RW, PT had to steady him as he was slightly unsteady.                              Pertinent Vitals/Pain Pain Assessment: No/denies pain  Pt on 4LO2 with ambulation to keep sats >90%.  In room, on 3LO2 with sats >90%.  DOE 3/4.  Other VSS.      Home Living Family/patient expects to be discharged to:: Private residence Living Arrangements: Alone Available Help at Discharge: Family;Available PRN/intermittently (Family states they can provide 24 hours short term) Type of Home: House Home Access: Stairs to enter Entrance Stairs-Rails: None Entrance Stairs-Number of Steps: 2 Home Layout: One level Home Equipment: Cane - single point (home O2 2L)      Prior Function Level of Independence:  Independent with assistive device(s)         Comments: Pt drove to church and close to house.  Has housekeeper.  Daughter takes to grocery store.  Pt cooks and bathes himself normally.       Hand Dominance        Extremity/Trunk Assessment   Upper Extremity Assessment: Defer to OT evaluation           Lower Extremity Assessment: Generalized weakness      Cervical / Trunk Assessment: Normal  Communication    Communication: No difficulties  Cognition Arousal/Alertness: Awake/alert Behavior During Therapy: WFL for tasks assessed/performed Overall Cognitive Status: Within Functional Limits for tasks assessed                      General Comments      Exercises        Assessment/Plan    PT Assessment Patient needs continued PT services  PT Diagnosis Generalized weakness   PT Problem List Decreased activity tolerance;Decreased balance;Decreased mobility;Decreased knowledge of use of DME;Decreased safety awareness;Decreased knowledge of precautions  PT Treatment Interventions DME instruction;Gait training;Functional mobility training;Therapeutic activities;Therapeutic exercise;Balance training;Patient/family education;Stair training   PT Goals (Current goals can be found in the Care Plan section) Acute Rehab PT Goals Patient Stated Goal: to get better PT Goal Formulation: With patient Time For Goal Achievement: 05/23/15 Potential to Achieve Goals: Good    Frequency Min 3X/week   Barriers to discharge        Co-evaluation               End of Session Equipment Utilized During Treatment: Gait belt;Oxygen Activity Tolerance: Patient tolerated treatment well Patient left: in chair;with call bell/phone within reach;with family/visitor present Nurse Communication: Mobility status         Time: 1325-1355 PT Time Calculation (min) (ACUTE ONLY): 30 min   Charges:   PT Evaluation $Initial PT Evaluation Tier I: 1 Procedure PT Treatments $Gait Training: 8-22 mins   PT G CodesDenice Paradise May 29, 2015, 4:03 PM Cortny Bambach,PT Acute Rehabilitation [phone] (pager)

## 2015-05-09 NOTE — Clinical Documentation Improvement (Signed)
Hospitalist  (Please document query responses in the progress notes and discharge summary.  Responses documented on the CDI BPA are not codeable because the CDI BPA is not part of the permanent medical record.)  Query 1 of 3    (please scroll down) "...Marland KitchenMarland KitchenAcute on chronic hypoxic respiratory failure due to early CAP" is documented in the progress note dated 05/08/15 by Dr. Candiss Norse.  Please document clinical signs and symptoms, baseline or historical information to support the diagnosis of acute on chronic hypoxic respiratory failure.  Query 2 of 3    (please scroll down) "Diastolic CHF" is documented in the current medical record.  Please document the ACUITY of the patient's diastolic CHF for this admission:  Acute, Chronic or Acute on Chronic.  Query 3 of 3 Possible Clinical Conditions:  - Acute Kidney Injury secondary to ......................................  - Other condition  - Unable to clinically determine  Clinical Information: Patient's creatinine has improved by 0.46 mg/dL in 48 hours. Patient received sepsis protocol fluids on admission per ED provider note. BUN/Cr/GFR trend this admission   (white male) Component     Latest Ref Rng 05/07/2015 05/08/2015 05/09/2015  BUN     6 - 20 mg/dL 27 (H) 26 (H) 22 (H)  Creatinine     0.61 - 1.24 mg/dL 1.75 (H) 1.65 (H) 1.29 (H)  EGFR (Non-African Amer.)     >60 mL/min 33 (L) 35 (L) 48 (L)       Please exercise your independent, professional judgment when responding. A specific answer is not anticipated or expected.   Thank You,  Erling Conte  RN BSN CCDS [phone] Health Information Management [place]

## 2015-05-09 NOTE — Progress Notes (Signed)
Report called to 5W. Pt VSS and no issues or pain at this time. Family at bedside. Leanne Chang, RN

## 2015-05-09 NOTE — Progress Notes (Signed)
Pt initially set up on CPAP 10cmH20 with 3LPM O2 bled in. Pt now stating that he thinks 10cmH20 is too much pressure. CPAP pressure titrated to 8cmH20 with 3LPM O2 bled in. Pt tolerating well at this time. RT will continue to monitor.

## 2015-05-10 ENCOUNTER — Inpatient Hospital Stay (HOSPITAL_COMMUNITY): Payer: Medicare Other

## 2015-05-10 LAB — GLUCOSE, CAPILLARY
GLUCOSE-CAPILLARY: 123 mg/dL — AB (ref 65–99)
GLUCOSE-CAPILLARY: 264 mg/dL — AB (ref 65–99)
Glucose-Capillary: 139 mg/dL — ABNORMAL HIGH (ref 65–99)
Glucose-Capillary: 255 mg/dL — ABNORMAL HIGH (ref 65–99)

## 2015-05-10 MED ORDER — INSULIN DETEMIR 100 UNIT/ML ~~LOC~~ SOLN
16.0000 [IU] | Freq: Two times a day (BID) | SUBCUTANEOUS | Status: DC
  Administered 2015-05-10 – 2015-05-11 (×3): 16 [IU] via SUBCUTANEOUS
  Filled 2015-05-10 (×4): qty 0.16

## 2015-05-10 MED ORDER — METHYLPREDNISOLONE SODIUM SUCC 125 MG IJ SOLR
60.0000 mg | Freq: Three times a day (TID) | INTRAMUSCULAR | Status: DC
  Administered 2015-05-10 (×2): 60 mg via INTRAVENOUS
  Filled 2015-05-10 (×3): qty 2

## 2015-05-10 MED ORDER — INSULIN ASPART 100 UNIT/ML ~~LOC~~ SOLN
5.0000 [IU] | Freq: Three times a day (TID) | SUBCUTANEOUS | Status: DC
  Administered 2015-05-10 – 2015-05-11 (×5): 5 [IU] via SUBCUTANEOUS

## 2015-05-10 MED ORDER — FUROSEMIDE 10 MG/ML IJ SOLN
40.0000 mg | Freq: Two times a day (BID) | INTRAMUSCULAR | Status: AC
  Administered 2015-05-10 – 2015-05-11 (×3): 40 mg via INTRAVENOUS
  Filled 2015-05-10 (×3): qty 4

## 2015-05-10 NOTE — Clinical Social Work Placement (Signed)
   CLINICAL SOCIAL WORK PLACEMENT  NOTE  Date:  05/10/2015  Patient Details  Name: [name] MRN: [id number] Date of Birth: Dec 09, 1924  Clinical Social Work is seeking post-discharge placement for this patient at the [place] level of care (*CSW will initial, date and re-position this form in  chart as items are completed):  Yes   Patient/family provided with [place] Work Department's list of facilities offering this level of care within the geographic area requested by the patient (or if unable, by the patient's family).  Yes   Patient/family informed of their freedom to choose among providers that offer the needed level of care, that participate in Medicare, Medicaid or managed care program needed by the patient, have an available bed and are willing to accept the patient.  Yes   Patient/family informed of [place]'s ownership interest in [facility] and [facility], as well as of the fact that they are under no obligation to receive care at these facilities.  PASRR submitted to EDS on 05/10/15     PASRR number received on 05/10/15     Existing PASRR number confirmed on       FL2 transmitted to all facilities in geographic area requested by pt/family on 05/10/15     FL2 transmitted to all facilities within larger geographic area on       Patient informed that his/her managed care company has contracts with or will negotiate with certain facilities, including the following:            Patient/family informed of bed offers received.  Patient chooses bed at       Physician recommends and patient chooses bed at      Patient to be transferred to   on  .  Patient to be transferred to facility by       Patient family notified on   of transfer.  Name of family member notified:        PHYSICIAN Please sign FL2     Additional Comment:    _______________________________________________ Cranford Mon, LCSW 05/10/2015,  8:50 AM

## 2015-05-10 NOTE — NC FL2 (Signed)
[place] LEVEL OF CARE SCREENING TOOL     IDENTIFICATION  Patient Name: [name] Birthdate: 02-01-1925 Sex: male Admission Date (Current Location): 05/07/2015  [facility] and [place] Number: [employer] and Address:  [facility] [place]. [facility], [place] [address] [place], [place] [address]      Provider Number: [id number]  Attending Physician Name and Address:  Thurnell Lose, MD  Relative Name and Phone Number:       Current Level of Care: Hospital Recommended Level of Care: [place] Prior Approval Number:    Date Approved/Denied:   PASRR Number: [id number]  Discharge Plan: SNF    Current Diagnoses: Patient Active Problem List   Diagnosis Date Noted  . Type 2 diabetes mellitus ([place]) 05/08/2015  . CAP (community acquired pneumonia) 05/07/2015  . Lactic acidosis 05/07/2015  . OSA on CPAP 01/03/2015  . Hypoxemia 12/31/2013  . Long-term (current) use of anticoagulants 11/12/2012  . History of DVT of right axillary vein 11/07/2012  . Secondary diabetes with peripheral neuropathy ([place]) 11/07/2012  . Hypertension 11/07/2012  . Hyperlipidemia 11/07/2012  . BPH (benign prostatic hypertrophy) 11/07/2012  . Paroxysmal atrial fibrillation ([place]) 11/07/2012  . Obstructive sleep apnea (adult)  10/29/2012  . Diverticulosis   . Lung cancer ([place])   . Diabetic peripheral neuropathy ([place])   . COPD (chronic obstructive pulmonary disease) ([place])   . Allergic rhinitis     Orientation ACTIVITIES/SOCIAL BLADDER RESPIRATION    Self, Time, Situation, Place  Family supportive Continent O2 (As needed) (3L [place])  BEHAVIORAL SYMPTOMS/MOOD NEUROLOGICAL BOWEL NUTRITION STATUS      Continent Diet (cardiac)  PHYSICIAN VISITS COMMUNICATION OF NEEDS Height & Weight Skin    Verbally '5\' 11"'$  (180.3 cm) 233 lbs. Normal          AMBULATORY STATUS RESPIRATION     (limited assist) O2 (As needed) (3L [place])      Personal Care Assistance Level  of Assistance  Bathing, Dressing Bathing Assistance: Limited assistance   Dressing Assistance: Limited assistance      Functional Limitations [place]  PT (By licensed PT)     PT Frequency: 5/wk             Additional Factors Info  Code Status, Allergies, Insulin Sliding Scale, Isolation Precautions Code Status Info: FULL Allergies Info: NKA   Insulin Sliding Scale Info: 5/day Isolation Precautions Info: MRSA     Current Medications (05/10/2015):  This is the current hospital active medication list Current Facility-Administered Medications  Medication Dose Route Frequency Provider Last Rate Last Dose  . amiodarone (PACERONE) tablet 200 mg  200 mg Oral Daily Reubin Milan, MD   200 mg at 05/09/15 1006  . apixaban (ELIQUIS) tablet 5 mg  5 mg Oral BID Reubin Milan, MD   5 mg at 05/09/15 2232  . atorvastatin (LIPITOR) tablet 20 mg  20 mg Oral QHS Reubin Milan, MD   20 mg at 05/09/15 2232  . azithromycin [facility]) tablet 500 mg  500 mg Oral Daily Jake Church Masters, RPH   500 mg at 05/09/15 1818  . cefTRIAXone (ROCEPHIN) 1 g in dextrose 5 % 50 mL IVPB  1 g Intravenous Q24H Reubin Milan, MD   1 g at 05/09/15 1947  . Chlorhexidine Gluconate Cloth 2 % PADS 6 each  6 each Topical Q0600 Mickel Baas  A [place], PA-C   6 each at 05/10/15 0550  . diltiazem (CARDIZEM CD) 24 hr capsule 120 mg  120 mg Oral BID Thurnell Lose, MD   120 mg at 05/09/15 2325  . insulin aspart (novoLOG) injection 0-20 Units  0-20 Units Subcutaneous TID WC Thurnell Lose, MD   3 Units at 05/09/15 1819  . insulin aspart (novoLOG) injection 0-5 Units  0-5 Units Subcutaneous QHS Thurnell Lose, MD   0 Units at 05/08/15 2300  . insulin aspart (novoLOG) injection 5 Units  5 Units Subcutaneous TID WC Thurnell Lose, MD      . insulin detemir (LEVEMIR) injection 16 Units  16 Units Subcutaneous BID Thurnell Lose, MD      . ipratropium (ATROVENT)  nebulizer solution 0.5 mg  0.5 mg Nebulization Q6H Reubin Milan, MD   0.5 mg at 05/10/15 0726  . levalbuterol (XOPENEX) nebulizer solution 1.25 mg  1.25 mg Nebulization 4 times per day Reubin Milan, MD   1.25 mg at 05/10/15 0726  . loratadine (CLARITIN) tablet 10 mg  10 mg Oral Daily Reubin Milan, MD   10 mg at 05/09/15 1005  . methylPREDNISolone sodium succinate (SOLU-MEDROL) 125 mg/2 mL injection 60 mg  60 mg Intravenous TID Thurnell Lose, MD      . mupirocin ointment (BACTROBAN) 2 % 1 application  1 application Nasal BID Pollie Friar, PA-C   1 application at 27/25/36 2233  . ondansetron (ZOFRAN) injection 4 mg  4 mg Intravenous Q6H PRN Reubin Milan, MD      . oxybutynin (DITROPAN-XL) 24 hr tablet 10 mg  10 mg Oral Daily Reubin Milan, MD   10 mg at 05/09/15 1005  . sodium chloride (OCEAN) 0.65 % nasal spray 1 spray  1 spray Each Nare PRN Thurnell Lose, MD      . temazepam (RESTORIL) capsule 15 mg  15 mg Oral QHS PRN Jeryl Columbia, NP   15 mg at 05/09/15 2232     Discharge Medications: Please see discharge summary for a list of discharge medications.  Relevant Imaging Results:  Relevant Lab Results:  Recent Labs    Additional Information SS#: [id number]  Cranford Mon, LCSW

## 2015-05-10 NOTE — Progress Notes (Signed)
Placed patient on CPAP set at 8cm with oxygen set at 3lpm.

## 2015-05-10 NOTE — Progress Notes (Addendum)
Patient Demographics:    [name], is a 81 y.o. male, DOB - 21-May-1925, [id number]  Admit date - 05/07/2015   Admitting Physician Reubin Milan, MD  Outpatient Primary MD for the patient is Tivis Ringer, MD  LOS - 3   Chief Complaint  Patient presents with  . Fatigue  . Chills        Subjective:    [name] today has, No headache, No chest pain, No abdominal pain - No Nausea, No new weakness tingling or numbness, improved Cough - SOB.  Feels better overall.   Assessment  & Plan :     1. Sepsis with Acute on chronic hypoxic respiratory failure due to early CAP. Rule out influenza, continue empiric antibiotics, continue oxygen and nebulizer treatments, note he uses 2 L his attention oxygen at home at all times. Monitor cultures , negative influenza panel. Sepsis resolved after hydration.   2. History of paroxysmal atrial fibrillation with [place] VASC 2 score of over 3. On Cardizem and Eliquis continue, goal will be controlled.   3. Obstructive sleep apnea. CPAP at night.   4. History of COPD. On home oxygen, currently mild wheezing, few doses of IV steroids and continue treatment as in #1 above and monitor closely.   5. Mild acute on chronic diastolic CHF. EF 50-55% on last echogram. Initiate IV Lasix, fluid restriction and monitor.    6. ARF on CKD 4 - baseline Creat 1.5, resolved after IVF.   7. DM type II. On Levemir, adjusted sliding scale monitor.  Lab Results  Component Value Date   HGBA1C 7.0* 05/08/2015    CBG (last 3)   Recent Labs  05/09/15 1728 05/09/15 2148 05/10/15 0735  GLUCAP 142* 121* 123*      Code Status : Full  Family Communication  : Daughter  Disposition Plan  : TBD, PT eval  Consults  :     Procedures  :    DVT Prophylaxis   :  Eliquis  Lab Results  Component Value Date   PLT 163 05/09/2015    Inpatient Medications  Scheduled Meds: . amiodarone  200 mg Oral Daily  . apixaban  5 mg Oral BID  . atorvastatin  20 mg Oral QHS  . azithromycin  500 mg Oral Daily  . cefTRIAXone (ROCEPHIN) IVPB 1 gram/50 mL D5W  1 g Intravenous Q24H  . Chlorhexidine Gluconate Cloth  6 each Topical Q0600  . diltiazem  120 mg Oral BID  . furosemide  40 mg Intravenous BID  . insulin aspart  0-20 Units Subcutaneous TID WC  . insulin aspart  0-5 Units Subcutaneous QHS  . insulin aspart  5 Units Subcutaneous TID WC  . insulin detemir  16 Units Subcutaneous BID  . ipratropium  0.5 mg Nebulization Q6H  . levalbuterol  1.25 mg Nebulization 4 times per day  . loratadine  10 mg Oral Daily  . methylPREDNISolone (SOLU-MEDROL) injection  60 mg Intravenous TID  . mupirocin ointment  1 application Nasal BID  . oxybutynin  10 mg Oral Daily   Continuous Infusions:   PRN Meds:.[DISCONTINUED] ondansetron **OR** ondansetron (ZOFRAN) IV, sodium chloride, temazepam  Antibiotics  :     Anti-infectives    Start  Dose/Rate Route Frequency Ordered Stop   05/09/15 1800  azithromycin (ZITHROMAX) tablet 500 mg     500 mg Oral Daily 05/09/15 1043     05/08/15 2000  azithromycin (ZITHROMAX) 500 mg in dextrose 5 % 250 mL IVPB  Status:  Discontinued     500 mg 250 mL/hr over 60 Minutes Intravenous Every 24 hours 05/07/15 2206 05/09/15 1043   05/08/15 1930  cefTRIAXone (ROCEPHIN) 1 g in dextrose 5 % 50 mL IVPB     1 g 100 mL/hr over 30 Minutes Intravenous Every 24 hours 05/07/15 2206     05/07/15 1930  cefTRIAXone (ROCEPHIN) 1 g in dextrose 5 % 50 mL IVPB     1 g 100 mL/hr over 30 Minutes Intravenous  Once 05/07/15 1919 05/07/15 1957   05/07/15 1930  azithromycin (ZITHROMAX) 500 mg in dextrose 5 % 250 mL IVPB     500 mg 250 mL/hr over 60 Minutes Intravenous  Once 05/07/15 1919 05/07/15 2106        Objective:   Filed Vitals:   05/09/15  2008 05/09/15 2050 05/10/15 0522 05/10/15 0800  BP:  107/53 114/87 113/53  Pulse:  66 70 63  Temp:  98.3 F (36.8 C) 98.1 F (36.7 C) 98.3 F (36.8 C)  TempSrc:  Oral Oral Oral  Resp:  '18 20 20  '$ Height:      Weight:      SpO2: 98% 96% 93% 95%    Wt Readings from Last 3 Encounters:  05/09/15 105.7 kg (233 lb 0.4 oz)  02/08/15 102.513 kg (226 lb)  01/03/15 103.874 kg (229 lb)     Intake/Output Summary (Last 24 hours) at 05/10/15 1042 Last data filed at 05/10/15 0935  Gross per 24 hour  Intake    390 ml  Output    725 ml  Net   -335 ml     Physical Exam  Awake Alert, Oriented X 3, No new F.N deficits, Normal affect Learned.AT,PERRAL Supple Neck,No JVD, No cervical lymphadenopathy appriciated.  Symmetrical Chest wall movement, Good air movement bilaterally, minimal bibasilar rales RRR,No Gallops,Rubs or new Murmurs, No Parasternal Heave +ve B.Sounds, Abd Soft, No tenderness, No organomegaly appriciated, No rebound - guarding or rigidity. No Cyanosis, Clubbing or edema, No new Rash or bruise      Data Review:   Micro Results Recent Results (from the past 240 hour(s))  Culture, blood (routine x 2)     Status: None (Preliminary result)   Collection Time: 05/07/15  5:35 PM  Result Value Ref Range Status   Specimen Description BLOOD PORTA CATH  Final   Special Requests BOTTLES DRAWN AEROBIC AND ANAEROBIC 5CC  Final   Culture NO GROWTH 2 DAYS  Final   Report Status PENDING  Incomplete  Urine culture     Status: None   Collection Time: 05/07/15  5:52 PM  Result Value Ref Range Status   Specimen Description URINE, CLEAN CATCH  Final   Special Requests NONE  Final   Culture NO GROWTH 1 DAY  Final   Report Status 05/08/2015 FINAL  Final  Culture, blood (routine x 2)     Status: None (Preliminary result)   Collection Time: 05/07/15  6:28 PM  Result Value Ref Range Status   Specimen Description BLOOD RIGHT ANTECUBITAL  Final   Special Requests BOTTLES DRAWN AEROBIC AND  ANAEROBIC 5CC  Final   Culture NO GROWTH 2 DAYS  Final   Report Status PENDING  Incomplete  MRSA PCR  Screening     Status: Abnormal   Collection Time: 05/07/15 11:25 PM  Result Value Ref Range Status   MRSA by PCR POSITIVE (A) NEGATIVE Final    Comment:        The GeneXpert MRSA Assay (FDA approved for NASAL specimens only), is one component of a comprehensive MRSA colonization surveillance program. It is not intended to diagnose MRSA infection nor to guide or monitor treatment for MRSA infections. RESULT CALLED TO, READ BACK BY AND VERIFIED WITH: RN Dolan Amen [phone] '@0138'$   THANEY   Culture, sputum-assessment     Status: None   Collection Time: 05/08/15  8:00 AM  Result Value Ref Range Status   Specimen Description SPUTUM  Final   Special Requests NONE  Final   Sputum evaluation   Final    THIS SPECIMEN IS ACCEPTABLE. RESPIRATORY CULTURE REPORT TO FOLLOW.   Report Status 05/08/2015 FINAL  Final  Culture, respiratory (NON-Expectorated)     Status: None (Preliminary result)   Collection Time: 05/08/15  8:00 AM  Result Value Ref Range Status   Specimen Description SPUTUM  Final   Special Requests NONE  Final   Gram Stain   Final    ABUNDANT WBC PRESENT,BOTH PMN AND MONONUCLEAR RARE SQUAMOUS EPITHELIAL CELLS PRESENT ABUNDANT GRAM POSITIVE COCCI IN PAIRS FEW GRAM POSITIVE RODS Performed at [facility]    Culture   Final    Culture reincubated for better growth Performed at [facility]    Report Status PENDING  Incomplete    Radiology Reports Dg Chest 2 View  05/10/2015  CLINICAL DATA:  Shortness of breath. EXAM: CHEST  2 VIEW COMPARISON:  May 07, 2015. FINDINGS: Stable cardiomediastinal silhouette. Left subclavian Port-A-Cath is unchanged with distal tip in expected position of the SVC. No pneumothorax is noted. Mild bilateral pleural effusions are noted. Multilevel degenerative disc disease is noted in the mid thoracic spine. Stable  interstitial densities are noted throughout both lungs most consistent with scarring, although superimposed edema cannot be excluded. Mild central pulmonary vascular congestion is noted. IMPRESSION: Mild central pulmonary vascular congestion is noted with interval development of mild bilateral pleural effusions. Stable interstitial densities are noted throughout both lungs which may represent scarring, but superimposed edema cannot be excluded. Electronically Signed   By: Marijo Conception, M.D.   On: 05/10/2015 09:18   Dg Chest 2 View  05/07/2015  CLINICAL DATA:  81 year old male with shortness of breath. EXAM: CHEST  2 VIEW COMPARISON:  Chest x-ray 05/17/2014. FINDINGS: Mild emphysematous changes are noted throughout the lungs bilaterally. Mild diffuse peribronchial cuffing. Trace bilateral pleural effusions (right greater than left). No acute consolidative airspace disease. No evidence of pulmonary edema. Heart size is normal. Upper mediastinal contours are within normal limits. Atherosclerosis in the thoracic aorta. Left-sided subclavian single-lumen porta cath with tip terminating in the mid superior vena cava. IMPRESSION: 1. Mild diffuse peribronchial cuffing and emphysematous changes in the lungs. This may simply reflect underlying COPD, however, the possibility of an acute bronchitis is not excluded. Clinical correlation is recommended. 2. Trace bilateral pleural effusions (right greater than left). 3. Atherosclerosis. Electronically Signed   By: Vinnie Langton M.D.   On: 05/07/2015 16:43     CBC  Recent Labs Lab 05/07/15 1735 05/08/15 0211 05/09/15 0459  WBC 19.8* 17.2* 15.4*  HGB 12.9* 11.8* 11.1*  HCT 38.4* 35.1* 34.2*  PLT 175 157 163  MCV 87.5 87.5 87.7  MCH 29.4 29.4 28.5  MCHC 33.6 33.6  32.5  RDW 15.3 15.4 15.4  LYMPHSABS 1.6 0.9  --   MONOABS 1.6* 0.6  --   EOSABS 0.0 0.0  --   BASOSABS 0.0 0.0  --     Chemistries   Recent Labs Lab 05/07/15 1735 05/08/15 0211  05/09/15 0459  NA 135 135 138  K 3.7 4.0 4.2  CL 101 100* 105  CO2 '24 25 28  '$ GLUCOSE 138* 307* 113*  BUN 27* 26* 22*  CREATININE 1.75* 1.65* 1.29*  CALCIUM 9.0 8.4* 8.5*  AST 90* 65*  --   ALT 65* 54  --   ALKPHOS 95 77  --   BILITOT 0.6 0.6  --    ------------------------------------------------------------------------------------------------------------------ estimated creatinine clearance is 49 mL/min (by C-G formula based on Cr of 1.29). ------------------------------------------------------------------------------------------------------------------  Recent Labs  05/08/15 0211  HGBA1C 7.0*   ------------------------------------------------------------------------------------------------------------------ No results for input(s): CHOL, HDL, LDLCALC, TRIG, CHOLHDL, LDLDIRECT in the last 72 hours. ------------------------------------------------------------------------------------------------------------------ No results for input(s): TSH, T4TOTAL, T3FREE, THYROIDAB in the last 72 hours.  Invalid input(s): FREET3 ------------------------------------------------------------------------------------------------------------------ No results for input(s): VITAMINB12, FOLATE, FERRITIN, TIBC, IRON, RETICCTPCT in the last 72 hours.  Coagulation profile No results for input(s): INR, PROTIME in the last 168 hours.  No results for input(s): DDIMER in the last 72 hours.  Cardiac Enzymes  Recent Labs Lab 05/07/15 1735  TROPONINI 0.03   ------------------------------------------------------------------------------------------------------------------ Invalid input(s): POCBNP   Time Spent in minutes  35   Bev Drennen K M.D on 05/10/2015 at 10:42 AM  Between 7am to 7pm - Pager - [phone]  After 7pm go to [email or web address] - password [facility]  [facility] -  Office  [phone]

## 2015-05-10 NOTE — Progress Notes (Signed)
Physical Therapy Treatment Patient Details Name: [name] MRN: [id number] DOB: 03/05/1925 Today's Date: 05/24/2015    History of Present Illness [name] is a 81 y.o. male with a past medical history of COPD, lung cancer, previous pneumonia, hyperlipidemia, paroxysmal atrial fibrillation, diastolic CHF, hypertension, DVT who comes with a history of shortness of breath associated with wheezing, productive cough, pleuritic chest pain, weakness, fever, chills, fatigue since earlier today.     PT Comments    Pt making steady progress. Continue to feel pt appropriate for ST-SNF and pt agreeable.  Follow Up Recommendations  SNF     Equipment Recommendations  Other (comment) (rollator)    Recommendations for Other Services       Precautions / Restrictions Precautions Precautions: Fall Restrictions Weight Bearing Restrictions: No    Mobility  Bed Mobility                  Transfers Overall transfer level: Needs assistance Equipment used: 4-wheeled walker Transfers: Sit to/from Stand Sit to Stand: Min guard         General transfer comment: Assist for balance and safety.  Ambulation/Gait Ambulation/Gait assistance: Min guard Ambulation Distance (Feet): 140 Feet (140' x 1, 100' x 1) Assistive device: Rolling walker (2 wheeled) Gait Pattern/deviations: Step-through pattern;Decreased stride length   Gait velocity interpretation: Below normal speed for age/gender General Gait Details: Pt with better mobility with rollator and able to take sitting rest break on rollator.   Stairs            Wheelchair Mobility    Modified Rankin (Stroke Patients Only)       Balance   Sitting-balance support: No upper extremity supported Sitting balance-Leahy Scale: Fair     Standing balance support: No upper extremity supported;During functional activity Standing balance-Leahy Scale: Fair                      Cognition Arousal/Alertness:  Awake/alert Behavior During Therapy: WFL for tasks assessed/performed Overall Cognitive Status: Within Functional Limits for tasks assessed                      Exercises      General Comments        Pertinent Vitals/Pain Pain Assessment: No/denies pain    Home Living                      Prior Function            PT Goals (current goals can now be found in the care plan section) Acute Rehab PT Goals Patient Stated Goal: to get better PT Goal Formulation: With patient Time For Goal Achievement: 05/23/15 Potential to Achieve Goals: Good Progress towards PT goals: Progressing toward goals    Frequency  Min 3X/week    PT Plan Current plan remains appropriate    Co-evaluation             End of Session Equipment Utilized During Treatment: Gait belt;Oxygen Activity Tolerance: Patient tolerated treatment well Patient left: in chair;with call bell/phone within reach;with family/visitor present     Time: 5809-9833 PT Time Calculation (min) (ACUTE ONLY): 17 min  Charges:  $Gait Training: 8-22 mins                    G Codes:      [name] 05-24-15, 11:48 AM  Suanne Marker PT [phone]

## 2015-05-10 NOTE — Consult Note (Signed)
Patrick Blizard EdD 

## 2015-05-10 NOTE — Clinical Social Work Note (Signed)
Clinical Social Work Assessment  Patient Details  Name: [name] MRN: [id number] Date of Birth: 17-Oct-1924  Date of referral:  05/10/15               Reason for consult:  Facility Placement                Permission sought to share information with:  Family Supports, Chartered certified accountant granted to share information::  Yes, Verbal Permission Granted  Name::        Agency::  [facility]  Relationship::  dtr  Contact Information:     Housing/Transportation Living arrangements for the past 2 months:  Single Family Home Source of Information:  Patient, Adult Children Patient Interpreter Needed:  None Criminal Activity/Legal Involvement Pertinent to Current Situation/Hospitalization:  No - Comment as needed Significant Relationships:  Adult Children Lives with:  Self Do you feel safe going back to the place where you live?  Yes Need for family participation in patient care:  Yes (Comment) (some help from dtrs with obtaining food/ household needs)  Care giving concerns:  Pt lives at home alone with help of caregiver- family and pt concerned about returning home immediately due to higher level of weakness   Facilities manager / plan:  CSW discussed PT and MD recommendation for short term SNF  Employment status:  Retired Nurse, adult PT Recommendations:  [place] / Referral to community resources:  [place]  Patient/Family's Response to care: Pt and pt dtr are agreeable to short term SNF prior to pt returning home- would prefer to have placement near pt family and friends in [facility]  Patient/Family's Understanding of and Emotional Response to Diagnosis, Current Treatment, and Prognosis: Appears to have good understanding- no questions or concerns at this time.  Emotional Assessment Appearance:  Appears stated age Attitude/Demeanor/Rapport:    Affect  (typically observed):  Appropriate Orientation:  Oriented to Self, Oriented to Place, Oriented to  Time, Oriented to Situation Alcohol / Substance use:  Not Applicable Psych involvement (Current and /or in the community):     Discharge Needs  Concerns to be addressed:  Care Coordination Readmission within the last 30 days:  No Current discharge risk:  Physical Impairment Barriers to Discharge:  Continued Medical Work up   [employer], LCSW 05/10/2015, 8:48 AM

## 2015-05-10 NOTE — Care Management Important Message (Signed)
Important Message  Patient Details  Name: [name] MRN: [id number] Date of Birth: 04-Nov-1924   Medicare Important Message Given:  Yes    Carles Collet, RN 05/10/2015, 11:18 AMImportant Message  Patient Details  Name: [name] MRN: [id number] Date of Birth: 04/10/1925   Medicare Important Message Given:  Yes    Carles Collet, RN 05/10/2015, 11:18 AM

## 2015-05-11 LAB — CULTURE, RESPIRATORY: CULTURE: NORMAL

## 2015-05-11 LAB — CULTURE, RESPIRATORY W GRAM STAIN

## 2015-05-11 LAB — GLUCOSE, CAPILLARY
Glucose-Capillary: 202 mg/dL — ABNORMAL HIGH (ref 65–99)
Glucose-Capillary: 239 mg/dL — ABNORMAL HIGH (ref 65–99)

## 2015-05-11 MED ORDER — CEFPODOXIME PROXETIL 200 MG PO TABS: 200.0000 mg | ORAL_TABLET | Freq: Two times a day (BID) | ORAL | Status: DC

## 2015-05-11 MED ORDER — METOLAZONE 2.5 MG PO TABS
2.5000 mg | ORAL_TABLET | Freq: Once | ORAL | Status: AC
  Administered 2015-05-11: 2.5 mg via ORAL
  Filled 2015-05-11: qty 1

## 2015-05-11 MED ORDER — HEPARIN SOD (PORK) LOCK FLUSH 100 UNIT/ML IV SOLN
500.0000 [IU] | INTRAVENOUS | Status: AC | PRN
  Administered 2015-05-11: 500 [IU]

## 2015-05-11 MED ORDER — FUROSEMIDE 40 MG PO TABS: 40.0000 mg | ORAL_TABLET | Freq: Two times a day (BID) | ORAL | Status: DC

## 2015-05-11 MED ORDER — AZITHROMYCIN 500 MG PO TABS: 500.0000 mg | ORAL_TABLET | Freq: Every day | ORAL | Status: DC

## 2015-05-11 MED ORDER — INSULIN ASPART 100 UNIT/ML ~~LOC~~ SOLN: SUBCUTANEOUS | Status: DC

## 2015-05-11 NOTE — Progress Notes (Deleted)
NT notified nurse of CBG 62. Pt is A&O at this time. Pt has no complaints. Administering Oral Glucose Gel. NT will re-check CBG in 15 minutes. Will continue to monitor pt. Bed remains in lowest position and call bell is within reach.

## 2015-05-11 NOTE — Discharge Summary (Signed)
[name], is a 81 y.o. male  DOB September 28, 1924  MRN [id number].  Admission date:  05/07/2015  Admitting Physician  Reubin Milan, MD  Discharge Date:  05/11/2015   Primary MD  Tivis Ringer, MD  Recommendations for primary care physician for things to follow:   Check 2 view chest x-ray, CBC and BMP in a week.   Admission Diagnosis  Prolonged Q-T interval on ECG [I45.81] CAP (community acquired pneumonia) [J18.9] Sepsis, due to unspecified organism [facility]) [A41.9]   Discharge Diagnosis  Prolonged Q-T interval on ECG [I45.81] CAP (community acquired pneumonia) [J18.9] Sepsis, due to unspecified organism ([place]) [A41.9]     Principal Problem:   CAP (community acquired pneumonia) Active Problems:   COPD (chronic obstructive pulmonary disease) ([place])   Hypertension   Paroxysmal atrial fibrillation (HCC)   OSA on CPAP   Lactic acidosis   Type 2 diabetes mellitus ([place])      Past Medical History  Diagnosis Date  . DVT (deep venous thrombosis) ([place])     IN RIGHT ARM 11/2010   . BPH (benign prostatic hyperplasia)   . COPD (chronic obstructive pulmonary disease) ([place])        . Hyperlipidemia   . Allergic rhinitis   . Allergic conjunctivitis   . DJD (degenerative joint disease)     Left Knee  . Pneumonia     history  . Diverticulosis     hx  . Cellulitis     hx  . Hearing loss   . Cataract   . Diabetic peripheral neuropathy ([place])   . Hypertension   . Obstructive sleep apnea (adult)  10/29/2012    This patient has mild obstructive sleep apnea, tested in March 2014 at [facility] sleep. He had been a CPAP user for 14 years. AHi 10.7 He was titrated to a pressure of 9 cm water( after auto - titration). CPAP at night  --- 8-10 YR AGO....,OSA-diagnosed 2000  . Secondary diabetes with peripheral  neuropathy ([place]) 11/07/2012  . Acute diastolic CHF (congestive heart failure) ([place])   . Atrial fibrillation ([place])   . Lung cancer ([place])      history of stage IIIA non-small cell lung cancer who is currently being treated with both  Radiation and Chemotherapy    Past Surgical History  Procedure Laterality Date  . Insertion of a left subclavian port-a-cath.  12/17/10    [place]  . Portacath placement  04/23/2011    Procedure: INSERTION PORT-A-CATH;  Surgeon: Pierre Bali, MD;  Location: [facility];  Service: Thoracic;;  Revision of  Porta-Cath  . Fiberoptic bronchoscopy  12/11/2010  . Video bronchoscopy  09/27/2011    Procedure: VIDEO BRONCHOSCOPY;  Surgeon: Nicanor Alcon, MD;  Location: [facility];  Service: Thoracic;  Laterality: N/A;  . Deviated septum repair    . Cardiac catheterization  2006       HPI  from the history and physical done on the day of admission:    [name] is a 81 y.o.  male with a past medical history of COPD, lung cancer, previous pneumonia, hyperlipidemia, paroxysmal atrial fibrillation, diastolic CHF, hypertension, DVT who comes with a history of shortness of breath associated with wheezing, productive cough, pleuritic chest pain, weakness, fever, chills, fatigue since earlier today.   Per patient, he woke up this morning, felt very weak to the point that he had a fall at home sustaining a skin tear in his right upper arm and in his left knee. He denies dizziness, palpitations, chest pain prior to the fall, but states that he fell at right mid back pain just prior to following. The patient has pleuritic pain in this area, although he feels better after he took analgesics at home and was given acetaminophen in the emergency department. He has also received supplemental oxygen, bronchodilators, prednisone and IV antibiotics sustaining some relief. He is currently in no acute distress.     Hospital Course:     1. Sepsis with Acute on chronic hypoxic respiratory  failure due to early CAP. Rule out influenza, continue empiric antibiotics, continue oxygen and nebulizer treatments, note he uses 2 L his attention oxygen at home at all times, his cultures remained negative he was negative for influenza panel as well. Sepsis resolved after hydration. Continue oral antibiotics for 4 more days as below. Close to baseline at this time.   2. History of paroxysmal atrial fibrillation with [place] VASC 2 score of over 3. On Cardizem and Eliquis continue, goal will be controlled.   3. Obstructive sleep apnea. CPAP at night.   4. History of COPD. On home oxygen, currently mild wheezing was likely due to fluid overload from #5 below, continue diuretics and continue treatment as in #1 above and monitor closely.   5. Mild acute on chronic diastolic CHF. EF 50-55% on last echogram. And into fluid overload due to hydration that he required initially while he was septic, subsequently IV fluids were stopped and he Improved after diuresis with IV Lasix, now feels close to baseline, have increased home dose Lasix from 40 once a day to 40 twice a day. Continue salt and fluid restriction, daily weights, monitor BMP closely and adjust diuretic dose as needed.   6. ARF on CKD 4 - baseline Creat 1.5, resolved after IVF which was needed initially during sepsis.   7. DM type II. Continue Levemir added sliding scale, stop Glucophage due to his age and potential for lactic acidosis. Check CBGs every before meals at bedtime.       Discharge Condition: Stable  Follow UP  Follow-up Information    Follow up with Tivis Ringer, MD. Schedule an appointment as soon as possible for a visit in 1 week.   Specialty:  Internal Medicine   Contact information:   [address] [place] [id number] [phone]        Consults obtained - none  Diet and Activity recommendation: See Discharge Instructions below  Discharge Instructions       Discharge Instructions     Discharge instructions    Complete by:  As directed   Follow with Primary MD Tivis Ringer, MD in 7 days   Get CBC, CMP, 2 view Chest X ray checked  by Primary MD next visit.    Activity: As tolerated with Full fall precautions use walker/cane & assistance as needed   Disposition Home     Diet: Heart Healthy - Low Carb. Check your Weight same time everyday, if you gain over 2 pounds, or you develop in  leg swelling, experience more shortness of breath or chest pain, call your Primary MD immediately. Follow Cardiac Low Salt Diet and 1.5 lit/day fluid restriction.   On your next visit with your primary care physician please Get Medicines reviewed and adjusted.   Please request your Prim.MD to go over all Hospital Tests and Procedure/Radiological results at the follow up, please get all Hospital records sent to your Prim MD by signing hospital release before you go home.   If you experience worsening of your admission symptoms, develop shortness of breath, life threatening emergency, suicidal or homicidal thoughts you must seek medical attention immediately by calling 911 or calling your MD immediately  if symptoms less severe.  You Must read complete instructions/literature along with all the possible adverse reactions/side effects for all the Medicines you take and that have been prescribed to you. Take any new Medicines after you have completely understood and accpet all the possible adverse reactions/side effects.   Do not drive, operating heavy machinery, perform activities at heights, swimming or participation in water activities or provide baby sitting services if your were admitted for syncope or siezures until you have seen by Primary MD or a Neurologist and advised to do so again.  Do not drive when taking Pain medications.    Do not take more than prescribed Pain, Sleep and Anxiety Medications  Special Instructions: If you have smoked or chewed Tobacco  in the last 2 yrs  please stop smoking, stop any regular Alcohol  and or any Recreational drug use.  Wear Seat belts while driving.   Please note  You were cared for by a hospitalist during your hospital stay. If you have any questions about your discharge medications or the care you received while you were in the hospital after you are discharged, you can call the unit and asked to speak with the hospitalist on call if the hospitalist that took care of you is not available. Once you are discharged, your primary care physician will handle any further medical issues. Please note that NO REFILLS for any discharge medications will be authorized once you are discharged, as it is imperative that you return to your primary care physician (or establish a relationship with a primary care physician if you do not have one) for your aftercare needs so that they can reassess your need for medications and monitor your lab values.     Increase activity slowly    Complete by:  As directed              Discharge Medications       Medication List    STOP taking these medications        ALEVE 220 MG tablet  Generic drug:  naproxen sodium     metFORMIN 500 MG 24 hr tablet  Commonly known as:  GLUCOPHAGE-XR      TAKE these medications        amiodarone 200 MG tablet  Commonly known as:  PACERONE  Take 200 mg by mouth daily.     apixaban 5 MG Tabs tablet  Commonly known as:  ELIQUIS  Take 1 tablet (5 mg total) by mouth 2 (two) times daily.     atorvastatin 20 MG tablet  Commonly known as:  LIPITOR  Take 1 tablet (20 mg total) by mouth daily.     azithromycin 500 MG tablet  Commonly known as:  ZITHROMAX  Take 1 tablet (500 mg total) by mouth daily. For 4 more days  BYDUREON 2 MG Pen  Generic drug:  Exenatide ER  Inject 2 mg into the skin once a week. On Mondays     cefpodoxime 200 MG tablet  Commonly known as:  VANTIN  Take 1 tablet (200 mg total) by mouth 2 (two) times daily. For 4 more days      cetirizine 10 MG tablet  Commonly known as:  ZYRTEC  Take 10 mg by mouth daily.     diltiazem 240 MG 24 hr capsule  Commonly known as:  CARDIZEM CD  Take 1 capsule (240 mg total) by mouth daily.     furosemide 40 MG tablet  Commonly known as:  LASIX  Take 1 tablet (40 mg total) by mouth 2 (two) times daily.     glucosamine-chondroitin 500-400 MG tablet  Take 1 tablet by mouth 2 (two) times daily.     insulin aspart 100 UNIT/ML injection  Commonly known as:  NOVOLOG  Before each meal 3 times a day, 140-199 - 2 units, 200-250 - 4 units, 251-299 - 6 units,  300-349 - 8 units,  350 or above 10 units. Dispense syringes and needles as needed, Ok to switch to PEN if approved. Substitute to any brand approved. DX DM2, Code E11.65     LEVEMIR FLEXTOUCH 100 UNIT/ML Pen  Generic drug:  Insulin Detemir  Inject 12-16 Units into the skin 2 (two) times daily as needed (CBG >100). If CBG >100 inject 16 units subcutaneously with breakfast and 12 units with supper     METAMUCIL PO  Take 2 capsules by mouth at bedtime. For constipation     oxybutynin 10 MG 24 hr tablet  Commonly known as:  DITROPAN-XL  Take 10 mg by mouth daily.     PEPTO-BISMOL PO  Take 2 tablets by mouth 3 (three) times daily as needed (nausea).     PROAIR HFA 108 (90 BASE) MCG/ACT inhaler  Generic drug:  albuterol  INHALE TWO PUFFS INTO THE LUNGS EVERY 6 HOURS AS NEEDED FOR WHEEZING AND FOR SHORTNESS OF BREATH     temazepam 15 MG capsule  Commonly known as:  RESTORIL  TAKE ONE CAPSULE BY MOUTH AT BEDTIME AS NEEDED FOR SLEEP     triamcinolone cream 0.1 %  Commonly known as:  KENALOG  Apply 1 application topically 2 (two) times daily as needed (contact dermatitis/ dry skin).        Major procedures and Radiology Reports - PLEASE review detailed and final reports for all details, in brief -       Dg Chest 2 View  05/10/2015  CLINICAL DATA:  Shortness of breath. EXAM: CHEST  2 VIEW COMPARISON:  May 07, 2015.  FINDINGS: Stable cardiomediastinal silhouette. Left subclavian Port-A-Cath is unchanged with distal tip in expected position of the SVC. No pneumothorax is noted. Mild bilateral pleural effusions are noted. Multilevel degenerative disc disease is noted in the mid thoracic spine. Stable interstitial densities are noted throughout both lungs most consistent with scarring, although superimposed edema cannot be excluded. Mild central pulmonary vascular congestion is noted. IMPRESSION: Mild central pulmonary vascular congestion is noted with interval development of mild bilateral pleural effusions. Stable interstitial densities are noted throughout both lungs which may represent scarring, but superimposed edema cannot be excluded. Electronically Signed   By: Marijo Conception, M.D.   On: 05/10/2015 09:18   Dg Chest 2 View  05/07/2015  CLINICAL DATA:  81 year old male with shortness of breath. EXAM: CHEST  2 VIEW COMPARISON:  Chest x-ray 05/17/2014. FINDINGS: Mild emphysematous changes are noted throughout the lungs bilaterally. Mild diffuse peribronchial cuffing. Trace bilateral pleural effusions (right greater than left). No acute consolidative airspace disease. No evidence of pulmonary edema. Heart size is normal. Upper mediastinal contours are within normal limits. Atherosclerosis in the thoracic aorta. Left-sided subclavian single-lumen porta cath with tip terminating in the mid superior vena cava. IMPRESSION: 1. Mild diffuse peribronchial cuffing and emphysematous changes in the lungs. This may simply reflect underlying COPD, however, the possibility of an acute bronchitis is not excluded. Clinical correlation is recommended. 2. Trace bilateral pleural effusions (right greater than left). 3. Atherosclerosis. Electronically Signed   By: Vinnie Langton M.D.   On: 05/07/2015 16:43    Micro Results      Recent Results (from the past 240 hour(s))  Culture, blood (routine x 2)     Status: None (Preliminary  result)   Collection Time: 05/07/15  5:35 PM  Result Value Ref Range Status   Specimen Description BLOOD PORTA CATH  Final   Special Requests BOTTLES DRAWN AEROBIC AND ANAEROBIC 5CC  Final   Culture NO GROWTH 3 DAYS  Final   Report Status PENDING  Incomplete  Urine culture     Status: None   Collection Time: 05/07/15  5:52 PM  Result Value Ref Range Status   Specimen Description URINE, CLEAN CATCH  Final   Special Requests NONE  Final   Culture NO GROWTH 1 DAY  Final   Report Status 05/08/2015 FINAL  Final  Culture, blood (routine x 2)     Status: None (Preliminary result)   Collection Time: 05/07/15  6:28 PM  Result Value Ref Range Status   Specimen Description BLOOD RIGHT ANTECUBITAL  Final   Special Requests BOTTLES DRAWN AEROBIC AND ANAEROBIC 5CC  Final   Culture NO GROWTH 3 DAYS  Final   Report Status PENDING  Incomplete  MRSA PCR Screening     Status: Abnormal   Collection Time: 05/07/15 11:25 PM  Result Value Ref Range Status   MRSA by PCR POSITIVE (A) NEGATIVE Final    Comment:        The GeneXpert MRSA Assay (FDA approved for NASAL specimens only), is one component of a comprehensive MRSA colonization surveillance program. It is not intended to diagnose MRSA infection nor to guide or monitor treatment for MRSA infections. RESULT CALLED TO, READ BACK BY AND VERIFIED WITH: RN Dolan Amen [phone] '@[id number]'$   THANEY   Culture, sputum-assessment     Status: None   Collection Time: 05/08/15  8:00 AM  Result Value Ref Range Status   Specimen Description SPUTUM  Final   Special Requests NONE  Final   Sputum evaluation   Final    THIS SPECIMEN IS ACCEPTABLE. RESPIRATORY CULTURE REPORT TO FOLLOW.   Report Status 05/08/2015 FINAL  Final  Culture, respiratory (NON-Expectorated)     Status: None (Preliminary result)   Collection Time: 05/08/15  8:00 AM  Result Value Ref Range Status   Specimen Description SPUTUM  Final   Special Requests NONE  Final   Gram Stain   Final     ABUNDANT WBC PRESENT,BOTH PMN AND MONONUCLEAR RARE SQUAMOUS EPITHELIAL CELLS PRESENT ABUNDANT GRAM POSITIVE COCCI IN PAIRS FEW GRAM POSITIVE RODS Performed at [facility]    Culture   Final    Culture reincubated for better growth Performed at [facility]    Report Status PENDING  Incomplete       Today  Subjective    [name] today has no headache,no chest abdominal pain,no new weakness tingling or numbness, feels much better    Objective   Blood pressure 126/75, pulse 77, temperature 97.7 F (36.5 C), temperature source Oral, resp. rate 18, height '5\' 11"'$  (1.803 m), weight 105.7 kg (233 lb 0.4 oz), SpO2 94 %.   Intake/Output Summary (Last 24 hours) at 05/11/15 1043 Last data filed at 05/11/15 0852  Gross per 24 hour  Intake    250 ml  Output    950 ml  Net   -700 ml    Exam Awake Alert, Oriented x 3, No new F.N deficits, Normal affect [place].AT,PERRAL Supple Neck,No JVD, No cervical lymphadenopathy appriciated.  Symmetrical Chest wall movement, Good air movement bilaterally, few bibasilar rales RRR,No Gallops,Rubs or new Murmurs, No Parasternal Heave +ve B.Sounds, Abd Soft, Non tender, No organomegaly appriciated, No rebound -guarding or rigidity. No Cyanosis, Clubbing or edema, No new Rash or bruise   Data Review   CBC w Diff: Lab Results  Component Value Date   WBC 15.4* 05/09/2015   WBC 8.6 02/08/2015   HGB 11.1* 05/09/2015   HGB 13.3 02/08/2015   HCT 34.2* 05/09/2015   HCT 40.2 02/08/2015   PLT 163 05/09/2015   PLT 180 02/08/2015   LYMPHOPCT 5 05/08/2015   LYMPHOPCT 18.4 02/08/2015   BANDSPCT 1 02/18/2011   MONOPCT 3 05/08/2015   MONOPCT 9.2 02/08/2015   EOSPCT 0 05/08/2015   EOSPCT 1.4 02/08/2015   BASOPCT 0 05/08/2015   BASOPCT 0.2 02/08/2015    CMP: Lab Results  Component Value Date   NA 138 05/09/2015   NA 141 02/08/2015   K 4.2 05/09/2015   K 4.4 02/08/2015   CL 105 05/09/2015   CL 102 02/08/2015   CO2 28  05/09/2015   CO2 28 02/08/2015   BUN 22* 05/09/2015   BUN 24* 02/08/2015   CREATININE 1.29* 05/09/2015   CREATININE 1.5* 02/08/2015   PROT 5.7* 05/08/2015   PROT 7.3 02/08/2015   ALBUMIN 2.6* 05/08/2015   ALBUMIN 3.4 02/08/2015   BILITOT 0.6 05/08/2015   BILITOT 0.70 02/08/2015   ALKPHOS 77 05/08/2015   ALKPHOS 96* 02/08/2015   AST 65* 05/08/2015   AST 137* 02/08/2015   ALT 54 05/08/2015   ALT 97* 02/08/2015  .   Total Time in preparing paper work, data evaluation and todays exam - 35 minutes  Thurnell Lose M.D on 05/11/2015 at 10:43 AM  [facility]   Office  [phone]

## 2015-05-11 NOTE — Progress Notes (Signed)
PT Cancellation Note  Patient Details Name: [name] MRN: [id number] DOB: 1924-07-08   Cancelled Treatment:    Reason Eval/Treat Not Completed: Patient declined, no reason specified;Other (comment) (Impending discharge).  Will try tomorrow if still here.   Ramond Dial 05/11/2015, 2:34 PM   [name], PT MS Acute Rehab Dept. Number: [facility] [id number] and [place] [phone]

## 2015-05-11 NOTE — Progress Notes (Signed)
Patient will DC to: [facility] Anticipated DC date: 05/11/15 Family notified: Daughter at [employer] by: [facility]  CSW signing off.  Cedric Fishman, [place] Social Worker [phone]

## 2015-05-11 NOTE — Clinical Social Work Placement (Signed)
   CLINICAL SOCIAL WORK PLACEMENT  NOTE  Date:  05/11/2015  Patient Details  Name: [name] MRN: [id number] Date of Birth: 25-Jun-1924  Clinical Social Work is seeking post-discharge placement for this patient at the [place] level of care (*CSW will initial, date and re-position this form in  chart as items are completed):  Yes   Patient/family provided with [facility] Work Department's list of facilities offering this level of care within the geographic area requested by the patient (or if unable, by the patient's family).  Yes   Patient/family informed of their freedom to choose among providers that offer the needed level of care, that participate in Medicare, Medicaid or managed care program needed by the patient, have an available bed and are willing to accept the patient.  Yes   Patient/family informed of [facility]'s ownership interest in [facility] and [facility], as well as of the fact that they are under no obligation to receive care at these facilities.  PASRR submitted to EDS on 05/10/15     PASRR number received on 05/10/15     Existing PASRR number confirmed on       FL2 transmitted to all facilities in geographic area requested by pt/family on 05/10/15     FL2 transmitted to all facilities within larger geographic area on       Patient informed that his/her managed care company has contracts with or will negotiate with certain facilities, including the following:        Yes   Patient/family informed of bed offers received.  Patient chooses bed at [facility]     Physician recommends and patient chooses bed at      Patient to be transferred to [facility] on 05/11/15.  Patient to be transferred to facility by [facility]     Patient family notified on 05/11/15 of transfer.  Name of family member notified:  Hilda [place], Daughter     PHYSICIAN       Additional  Comment:    _______________________________________________ Benard Halsted, LCSW 05/11/2015, 11:21 AM

## 2015-05-12 LAB — CULTURE, BLOOD (ROUTINE X 2)
CULTURE: NO GROWTH
Culture: NO GROWTH

## 2015-06-27 ENCOUNTER — Other Ambulatory Visit: Payer: Self-pay | Admitting: Internal Medicine

## 2015-06-27 DIAGNOSIS — R748 Abnormal levels of other serum enzymes: Secondary | ICD-10-CM

## 2015-07-03 ENCOUNTER — Ambulatory Visit
Admission: RE | Admit: 2015-07-03 | Discharge: 2015-07-03 | Disposition: A | Discharge status: Home / Self Care | Payer: Medicare Other | Source: Ambulatory Visit | Attending: Internal Medicine | Admitting: Internal Medicine

## 2015-07-03 DIAGNOSIS — R748 Abnormal levels of other serum enzymes: Secondary | ICD-10-CM

## 2015-07-04 ENCOUNTER — Ambulatory Visit (HOSPITAL_BASED_OUTPATIENT_CLINIC_OR_DEPARTMENT_OTHER): Payer: Medicare Other

## 2015-07-04 VITALS — BP 107/65 | HR 57 | Temp 97.5°F | Resp 20

## 2015-07-04 DIAGNOSIS — C3491 Malignant neoplasm of unspecified part of right bronchus or lung: Secondary | ICD-10-CM | POA: Diagnosis not present

## 2015-07-04 DIAGNOSIS — Z452 Encounter for adjustment and management of vascular access device: Secondary | ICD-10-CM | POA: Diagnosis not present

## 2015-07-04 DIAGNOSIS — C3411 Malignant neoplasm of upper lobe, right bronchus or lung: Secondary | ICD-10-CM

## 2015-07-04 MED ORDER — HEPARIN SOD (PORK) LOCK FLUSH 100 UNIT/ML IV SOLN
500.0000 [IU] | Freq: Once | INTRAVENOUS | Status: AC
  Administered 2015-07-04: 500 [IU] via INTRAVENOUS
  Filled 2015-07-04: qty 5

## 2015-07-04 MED ORDER — SODIUM CHLORIDE 0.9 % IJ SOLN
10.0000 mL | INTRAMUSCULAR | Status: DC | PRN
  Administered 2015-07-04: 10 mL via INTRAVENOUS
  Filled 2015-07-04: qty 10

## 2015-07-04 NOTE — Patient Instructions (Signed)

## 2015-07-04 NOTE — Progress Notes (Signed)
[name] presented for Portacath access and flush. Proper placement of portacath confirmed by CXR. Portacath located in the left chest wall accessed with  H 20 needle. Clean, Dry and Intact Good blood return present. Portacath flushed with 20ml NS and 500U/5ml Heparin per protocol and needle removed intact. Procedure without incident. Patient tolerated procedure well.   

## 2015-08-08 ENCOUNTER — Other Ambulatory Visit (HOSPITAL_BASED_OUTPATIENT_CLINIC_OR_DEPARTMENT_OTHER): Payer: Medicare Other

## 2015-08-08 ENCOUNTER — Ambulatory Visit (HOSPITAL_BASED_OUTPATIENT_CLINIC_OR_DEPARTMENT_OTHER): Payer: Medicare Other | Admitting: Hematology & Oncology

## 2015-08-08 VITALS — BP 118/61 | HR 59 | Temp 97.8°F | Wt 228.0 lb

## 2015-08-08 DIAGNOSIS — Z86718 Personal history of other venous thrombosis and embolism: Secondary | ICD-10-CM

## 2015-08-08 DIAGNOSIS — E1142 Type 2 diabetes mellitus with diabetic polyneuropathy: Secondary | ICD-10-CM

## 2015-08-08 DIAGNOSIS — I4891 Unspecified atrial fibrillation: Secondary | ICD-10-CM | POA: Diagnosis not present

## 2015-08-08 DIAGNOSIS — C3411 Malignant neoplasm of upper lobe, right bronchus or lung: Secondary | ICD-10-CM

## 2015-08-08 DIAGNOSIS — C342 Malignant neoplasm of middle lobe, bronchus or lung: Secondary | ICD-10-CM

## 2015-08-08 DIAGNOSIS — Z85118 Personal history of other malignant neoplasm of bronchus and lung: Secondary | ICD-10-CM

## 2015-08-08 LAB — COMPREHENSIVE METABOLIC PANEL
ALBUMIN: 3.3 g/dL — AB (ref 3.5–5.0)
ALT: 51 U/L (ref 0–55)
AST: 69 U/L — AB (ref 5–34)
Alkaline Phosphatase: 100 U/L (ref 40–150)
Anion Gap: 8 mEq/L (ref 3–11)
BUN: 23.9 mg/dL (ref 7.0–26.0)
CALCIUM: 8.6 mg/dL (ref 8.4–10.4)
CHLORIDE: 105 meq/L (ref 98–109)
CO2: 26 mEq/L (ref 22–29)
CREATININE: 1.4 mg/dL — AB (ref 0.7–1.3)
EGFR: 43 mL/min/{1.73_m2} — ABNORMAL LOW (ref >90)
GLUCOSE: 199 mg/dL — AB (ref 70–140)
POTASSIUM: 4.5 meq/L (ref 3.5–5.1)
SODIUM: 140 meq/L (ref 136–145)
TOTAL PROTEIN: 7.2 g/dL (ref 6.4–8.3)
Total Bilirubin: 0.37 mg/dL (ref 0.20–1.20)

## 2015-08-08 LAB — CBC WITH DIFFERENTIAL (CANCER CENTER ONLY)
BASO#: 0 10*3/uL (ref 0.0–0.2)
BASO%: 0.4 % (ref 0.0–2.0)
EOS%: 2.2 % (ref 0.0–7.0)
Eosinophils Absolute: 0.2 10*3/uL (ref 0.0–0.5)
HCT: 39 % (ref 38.7–49.9)
HGB: 12.4 g/dL — ABNORMAL LOW (ref 13.0–17.1)
LYMPH#: 1.6 10*3/uL (ref 0.9–3.3)
LYMPH%: 23.6 % (ref 14.0–48.0)
MCH: 28.1 pg (ref 28.0–33.4)
MCHC: 31.8 g/dL — ABNORMAL LOW (ref 32.0–35.9)
MCV: 88 fL (ref 82–98)
MONO#: 0.6 10*3/uL (ref 0.1–0.9)
MONO%: 8.8 % (ref 0.0–13.0)
NEUT#: 4.4 10*3/uL (ref 1.5–6.5)
NEUT%: 65 % (ref 40.0–80.0)
PLATELETS: 193 10*3/uL (ref 145–400)
RBC: 4.42 10*6/uL (ref 4.20–5.70)
RDW: 16.2 % — AB (ref 11.1–15.7)
WBC: 6.8 10*3/uL (ref 4.0–10.0)

## 2015-08-08 NOTE — Progress Notes (Signed)
Hematology and Oncology Follow Up Visit  [name] [id number] 24-Apr-1925 82 y.o. Oct 20, 202017   Principle Diagnosis:  . 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 2. Paroxysmal atrial fibrillation. 3. 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  Current Therapy:   Eliquis 5 mg p.o. b.i.d.     Interim History:  [name] is back for followup. He looks pretty good. He feels okay.  He was hospitalized back in December. He apparently had a fall. I think his medications were readjusted. He was then taken to a rehabilitation facility.  There's not been any problems with bleeding. He's had no increased problems with his lungs. He is on supplemental oxygen.  Thankfully, some of his medications have been stopped.  He still has the atrial fibrillation. He is on amiodarone.  He's had no problems with obvious bleeding. There's been no change in bowel or bladder habits. He's had no increased cough. He's had no pain. He's had no leg swelling.  Overall, his performance status is ECOG 2.  Medications:  Current outpatient prescriptions:  .  amiodarone (PACERONE) 200 MG tablet, Take 200 mg by mouth daily., Disp: , Rfl:  .  apixaban (ELIQUIS) 5 MG TABS tablet, Take 1 tablet (5 mg total) by mouth 2 (two) times daily., Disp: 60 tablet, Rfl: 12 .  atorvastatin (LIPITOR) 20 MG tablet, Take 1 tablet (20 mg total) by mouth daily. (Patient taking differently: Take 20 mg by mouth at bedtime. ), Disp: 30 tablet, Rfl: 12 .  cetirizine (ZYRTEC) 10 MG tablet, Take 10 mg by mouth daily. , Disp: , Rfl:  .  diltiazem (CARDIZEM CD) 240 MG 24 hr capsule, Take 1 capsule (240 mg total) by mouth daily., Disp: 30 capsule, Rfl: 12 .  Exenatide ER (BYDUREON) 2 MG PEN, Inject 2 mg into the skin once a week. On Mondays, Disp: , Rfl:  .  furosemide (LASIX) 40 MG tablet, Take 1 tablet (40 mg total) by mouth 2 (two) times daily., Disp: 30 tablet, Rfl: 12 .   glucosamine-chondroitin 500-400 MG tablet, Take 1 tablet by mouth 2 (two) times daily. , Disp: , Rfl:  .  Insulin Detemir (LEVEMIR FLEXTOUCH) 100 UNIT/ML Pen, Inject 12-16 Units into the skin 2 (two) times daily as needed (CBG >100). If CBG >100 inject 16 units subcutaneously with breakfast and 12 units with supper, Disp: , Rfl:  .  oxybutynin (DITROPAN-XL) 10 MG 24 hr tablet, Take 10 mg by mouth daily. , Disp: , Rfl: 1 .  PROAIR HFA 108 (90 BASE) MCG/ACT inhaler, INHALE TWO PUFFS INTO THE LUNGS EVERY 6 HOURS AS NEEDED FOR WHEEZING AND FOR SHORTNESS OF BREATH, Disp: 9 each, Rfl: 5 .  temazepam (RESTORIL) 15 MG capsule, TAKE ONE CAPSULE BY MOUTH AT BEDTIME AS NEEDED FOR SLEEP (Patient taking differently: TAKE ONE CAPSULE BY MOUTH DAILY AT BEDTIME), Disp: 30 capsule, Rfl: 0  Allergies: No Known Allergies  Past Medical History, Surgical history, Social history, and Family History were reviewed and updated.  Review of Systems: As above  Physical Exam:  weight is 228 lb (103.42 kg). His oral temperature is 97.8 F (36.6 C). His blood pressure is 118/61 and his pulse is 59.   Very [name] gentleman. Head and neck exam shows no ocular or oral lesions. He has no adenopathy in the neck. Lungs are clear. Cardiac exam regular rate and rhythm this is with atrial fibrillation. He has  a 1/6 murmur. Abdomen soft. Has good bowel sounds. There is no palpable abdominal mass. There is no fluid wave. Back exam no tenderness over the spine ribs or hips. Extremities shows some trace chronic edema in his legs. Has good strength. Has good range of motion of his joints. Skin exam shows some actinic keratoses. Some ecchymoses are noted. No petechia. Neurological exam is negative. Lab Results  Component Value Date   WBC 6.8 2020/12/315   HGB 12.4* 2020/12/315   HCT 39.0 2020/12/315   MCV 88 2020/12/315   PLT 193 2020/12/315     Chemistry      Component Value Date/Time   NA 138 05/09/2015 0459   NA 141 02/08/2015 1120    K 4.2 05/09/2015 0459   K 4.4 02/08/2015 1120   CL 105 05/09/2015 0459   CL 102 02/08/2015 1120   CO2 28 05/09/2015 0459   CO2 28 02/08/2015 1120   BUN 22* 05/09/2015 0459   BUN 24* 02/08/2015 1120   CREATININE 1.29* 05/09/2015 0459   CREATININE 1.5* 02/08/2015 1120      Component Value Date/Time   CALCIUM 8.5* 05/09/2015 0459   CALCIUM 8.9 02/08/2015 1120   ALKPHOS 77 05/08/2015 0211   ALKPHOS 96* 02/08/2015 1120   AST 65* 05/08/2015 0211   AST 137* 02/08/2015 1120   ALT 54 05/08/2015 0211   ALT 97* 02/08/2015 1120   BILITOT 0.6 05/08/2015 0211   BILITOT 0.70 02/08/2015 1120         Impression and Plan: [name] is 82 year old gentleman with a history of locally advanced-stage IIIB-adenocarcinoma of the right lung. He had contralateral mediastinal node involvement. He was treated with radiation chemotherapy. He had a very nice response. He completed  treatment in December of 2012. Thankfully, His disease has not come back. This I am surprised by because of the extensive nature of his locally advanced disease.  He has cardiac issues. He has pulmonary issues. These clearly are more important for his prognosis now.  I think we can get him back in 6 months.  He does have a Port-A-Cath in. I do think that the Port-A-Cath will be useful for him given his other health issues and the fact that he likely will be hospitalized at some point because of complications of his cardiopulmonary disease.   Volanda Napoleon, MD 03-03-201710:12 AM

## 2015-08-14 ENCOUNTER — Ambulatory Visit (INDEPENDENT_AMBULATORY_CARE_PROVIDER_SITE_OTHER): Payer: Medicare Other | Admitting: Emergency Medicine

## 2015-08-14 ENCOUNTER — Encounter: Payer: Self-pay | Admitting: Emergency Medicine

## 2015-08-14 VITALS — BP 120/80 | HR 50 | Ht 70.0 in | Wt 226.0 lb

## 2015-08-14 DIAGNOSIS — G4733 Obstructive sleep apnea (adult) (pediatric): Secondary | ICD-10-CM | POA: Diagnosis not present

## 2015-08-14 DIAGNOSIS — Z9989 Dependence on other enabling machines and devices: Principal | ICD-10-CM

## 2015-08-14 DIAGNOSIS — C342 Malignant neoplasm of middle lobe, bronchus or lung: Secondary | ICD-10-CM

## 2015-08-14 DIAGNOSIS — J449 Chronic obstructive pulmonary disease, unspecified: Secondary | ICD-10-CM

## 2015-08-14 NOTE — Assessment & Plan Note (Signed)
Discussed possibly changing to schedule bronchodilators. He doesn't want to do this at this time. We will continue albuterol when necessary. Reconsider starting Spiriva again if his daily symptoms become more bothersome

## 2015-08-14 NOTE — Progress Notes (Signed)
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 Was admitted 5/31 with acute dyspnea, found to be in A fib + RVR. Started on amiodarone. Converted to NSR 11/11/12. Scheduled for repeat Ct scan chest (Dr Katheran Awe) for surveillance on 11/25/12. During his hospitalization, we started the eval for his presumed COPD.  He has been on Spiriva before, SABA before, didn't notice any benefit.   ROV 02/03/13 -- Hx COPD, OSA on CPAP, adenoCA. Last time we tried anoro to see if she would benefit. He isn't sure that the medication is doing anything. He does feel stronger and has little cough or wheeze. He is wearing o2 on 2L/min at all times. Wears CPAP reliably.   ROV 08/25/13 -- follows up for COPD, OSA w good CPAP compliance, hx RML adenoCA (stable CT scan 12/14), DVT, HTN. We tried to get him a portable concentrator, but it was larger than he thought and was too bulky. He is back on O2 tanks 2L/min. He has some exertional SOB especially w stairs. He does not believe that Anoro helped, not interested in an alternative inhaled right now. No flares, no AE's, no pred/abx.   ROV 02/02/14 -- follows for COPD, OSA on CPAP, hx adenoCA RML. He is due for a CT scan in 03/2014. He has not really benefited from scheduled BD's. He uses ProAir rarely for wheeze and in prep for exertion.   ROV 08/12/14 -- follow up visit for his COPD, OSA on CPAP, RML adenoCA. He is still having the same stable dyspnea, limits his activity. No AE COPD but he has had flares of A fib that has lead to some volume overload. He sees Dr Wynonia Lawman for this, has had his diuretics adjusted. He is not on any BD's because they have never seemed to help.  He is getting serial CXR's w Dr Katheran Awe.   ROV 08/14/15 -- follow-up visit for history of COPD, OSA on CPAP. He also has a history of adenocarcinoma of the lung followed by Dr Katheran Awe, treated with chemoradiation. He has not  any evidence clinically of recurrence.  He was admitted end of 11/16 with CAP and Ae-COPD. No CT chest in several years, CXR without evidence recurrence in December. He is not on scheduled BD, didn't respond to Spiriva.    Filed Vitals:   08/14/15 1050 08/14/15 1051  BP:  120/80  Pulse:  50  Height: '5\' 10"'$  (1.778 m)   Weight: 226 lb (102.513 kg)   SpO2:  94%   Gen: Pleasant, well-nourished, in no distress,  normal affect  ENT: No lesions,  mouth clear,  oropharynx clear, no postnasal drip  Neck: No JVD, no TMG, no carotid bruits  Lungs: No use of accessory muscles, no dullness to percussion, clear without rales or rhonchi  Cardiovascular: RRR, heart sounds normal, no murmur or gallops, no peripheral edema  Musculoskeletal: No deformities, no cyanosis or clubbing  Neuro: alert, non focal  Skin: Warm, no lesions or rashes   05/13/13 --  COMPARISON: 11/25/2012  FINDINGS:  11 mm right paratracheal lymph remains stable. No other  pathologically enlarged nodes visualized within the thorax. A tiny  right pleural effusions decreased in size since previous study. No  evidence of left-sided pleural effusion. Mild emphysema and  bilateral scarring remains stable. No suspicious pulmonary nodules  or masses are seen. No evidence of acute infiltrate or central  endobronchial lesion.  No evidence of chest wall mass or suspicious bone lesions. Both  adrenal glands remain normal in appearance.  IMPRESSION:  Stable 11 mm right paratracheal lymph node. No evidence of new or  progressive disease within the thorax.  Tiny right pleural effusion has decreased in size.    OSA on CPAP He wants to change to this office to manage his sleep apnea and CPAP. We will try to arrange for this.  COPD (chronic obstructive pulmonary disease) (HCC) Discussed possibly changing to schedule bronchodilators. He doesn't want to do this at this time. We will continue albuterol when necessary. Reconsider  starting Spiriva again if his daily symptoms become more bothersome  Lung cancer Adenocarcinoma treated with chemoradiation and without any evidence of recurrence although he hasn't had a repeat CT scan recently. He follows with Dr. Katheran Awe and his chest x-ray has been stable. I will defer timing of any repeat imaging to oncology.

## 2015-08-14 NOTE — Assessment & Plan Note (Signed)
He wants to change to this office to manage his sleep apnea and CPAP. We will try to arrange for this.

## 2015-08-14 NOTE — Assessment & Plan Note (Signed)
Adenocarcinoma treated with chemoradiation and without any evidence of recurrence although he hasn't had a repeat CT scan recently. He follows with Dr. Katheran Awe and his chest x-ray has been stable. I will defer timing of any repeat imaging to oncology.

## 2015-08-14 NOTE — Patient Instructions (Addendum)
Please continue your albuterol 2 puffs as needed.  Follow with Dr Katheran Awe and get your chest CT per his schedule and recommendations.  Continue your oxygen as you are using it.  Continue your CPAP every night Follow with Dr Lamonte Sakai in 12 months or sooner if you have any problems

## 2015-09-04 ENCOUNTER — Encounter (HOSPITAL_COMMUNITY): Payer: Self-pay

## 2015-09-04 ENCOUNTER — Emergency Department (HOSPITAL_COMMUNITY)
Admission: EM | Admit: 2015-09-04 | Discharge: 2015-09-04 | Disposition: A | Discharge status: Home / Self Care | Payer: Medicare Other | Attending: Emergency Medicine | Admitting: Emergency Medicine

## 2015-09-04 ENCOUNTER — Emergency Department (HOSPITAL_COMMUNITY): Payer: Medicare Other

## 2015-09-04 DIAGNOSIS — E1140 Type 2 diabetes mellitus with diabetic neuropathy, unspecified: Secondary | ICD-10-CM | POA: Insufficient documentation

## 2015-09-04 DIAGNOSIS — H9190 Unspecified hearing loss, unspecified ear: Secondary | ICD-10-CM | POA: Insufficient documentation

## 2015-09-04 DIAGNOSIS — E785 Hyperlipidemia, unspecified: Secondary | ICD-10-CM | POA: Insufficient documentation

## 2015-09-04 DIAGNOSIS — Z79899 Other long term (current) drug therapy: Secondary | ICD-10-CM | POA: Insufficient documentation

## 2015-09-04 DIAGNOSIS — R0602 Shortness of breath: Secondary | ICD-10-CM | POA: Diagnosis present

## 2015-09-04 DIAGNOSIS — Z9889 Other specified postprocedural states: Secondary | ICD-10-CM | POA: Insufficient documentation

## 2015-09-04 DIAGNOSIS — I4891 Unspecified atrial fibrillation: Secondary | ICD-10-CM | POA: Insufficient documentation

## 2015-09-04 DIAGNOSIS — Z794 Long term (current) use of insulin: Secondary | ICD-10-CM | POA: Diagnosis not present

## 2015-09-04 DIAGNOSIS — I10 Essential (primary) hypertension: Secondary | ICD-10-CM | POA: Diagnosis not present

## 2015-09-04 DIAGNOSIS — M1990 Unspecified osteoarthritis, unspecified site: Secondary | ICD-10-CM | POA: Insufficient documentation

## 2015-09-04 DIAGNOSIS — Z85118 Personal history of other malignant neoplasm of bronchus and lung: Secondary | ICD-10-CM | POA: Diagnosis not present

## 2015-09-04 DIAGNOSIS — H269 Unspecified cataract: Secondary | ICD-10-CM | POA: Diagnosis not present

## 2015-09-04 DIAGNOSIS — Z872 Personal history of diseases of the skin and subcutaneous tissue: Secondary | ICD-10-CM | POA: Insufficient documentation

## 2015-09-04 DIAGNOSIS — J159 Unspecified bacterial pneumonia: Secondary | ICD-10-CM | POA: Insufficient documentation

## 2015-09-04 DIAGNOSIS — I5031 Acute diastolic (congestive) heart failure: Secondary | ICD-10-CM | POA: Diagnosis not present

## 2015-09-04 DIAGNOSIS — Z7901 Long term (current) use of anticoagulants: Secondary | ICD-10-CM | POA: Insufficient documentation

## 2015-09-04 DIAGNOSIS — J441 Chronic obstructive pulmonary disease with (acute) exacerbation: Secondary | ICD-10-CM | POA: Diagnosis not present

## 2015-09-04 DIAGNOSIS — Z86718 Personal history of other venous thrombosis and embolism: Secondary | ICD-10-CM | POA: Diagnosis not present

## 2015-09-04 DIAGNOSIS — Z87438 Personal history of other diseases of male genital organs: Secondary | ICD-10-CM | POA: Insufficient documentation

## 2015-09-04 DIAGNOSIS — Z8719 Personal history of other diseases of the digestive system: Secondary | ICD-10-CM | POA: Diagnosis not present

## 2015-09-04 DIAGNOSIS — Z87891 Personal history of nicotine dependence: Secondary | ICD-10-CM | POA: Diagnosis not present

## 2015-09-04 DIAGNOSIS — J189 Pneumonia, unspecified organism: Secondary | ICD-10-CM

## 2015-09-04 LAB — PROTIME-INR
INR: 1.52 — AB (ref 0.00–1.49)
PROTHROMBIN TIME: 18.4 s — AB (ref 11.6–15.2)

## 2015-09-04 LAB — BRAIN NATRIURETIC PEPTIDE: B NATRIURETIC PEPTIDE 5: 135.4 pg/mL — AB (ref 0.0–100.0)

## 2015-09-04 LAB — BASIC METABOLIC PANEL
ANION GAP: 10 (ref 5–15)
BUN: 17 mg/dL (ref 6–20)
CHLORIDE: 100 mmol/L — AB (ref 101–111)
CO2: 26 mmol/L (ref 22–32)
CREATININE: 1.49 mg/dL — AB (ref 0.61–1.24)
Calcium: 8.7 mg/dL — ABNORMAL LOW (ref 8.9–10.3)
GFR calc non Af Amer: 40 mL/min — ABNORMAL LOW (ref >60)
GFR, EST AFRICAN AMERICAN: 46 mL/min — AB (ref >60)
Glucose, Bld: 159 mg/dL — ABNORMAL HIGH (ref 65–99)
POTASSIUM: 4 mmol/L (ref 3.5–5.1)
SODIUM: 136 mmol/L (ref 135–145)

## 2015-09-04 LAB — CBC
HCT: 37.8 % — ABNORMAL LOW (ref 39.0–52.0)
Hemoglobin: 12.2 g/dL — ABNORMAL LOW (ref 13.0–17.0)
MCH: 27.8 pg (ref 26.0–34.0)
MCHC: 32.3 g/dL (ref 30.0–36.0)
MCV: 86.1 fL (ref 78.0–100.0)
Platelets: 201 10*3/uL (ref 150–400)
RBC: 4.39 MIL/uL (ref 4.22–5.81)
RDW: 15.7 % — ABNORMAL HIGH (ref 11.5–15.5)
WBC: 13.6 10*3/uL — ABNORMAL HIGH (ref 4.0–10.5)

## 2015-09-04 LAB — I-STAT TROPONIN, ED: Troponin i, poc: 0 ng/mL (ref 0.00–0.08)

## 2015-09-04 MED ORDER — IPRATROPIUM-ALBUTEROL 0.5-2.5 (3) MG/3ML IN SOLN
3.0000 mL | RESPIRATORY_TRACT | Status: AC
  Administered 2015-09-04 (×3): 3 mL via RESPIRATORY_TRACT
  Filled 2015-09-04: qty 6
  Filled 2015-09-04: qty 3

## 2015-09-04 MED ORDER — AZITHROMYCIN 250 MG PO TABS: 250.0000 mg | ORAL_TABLET | Freq: Every day | ORAL | Status: DC

## 2015-09-04 MED ORDER — CEPHALEXIN 500 MG PO CAPS: 500.0000 mg | ORAL_CAPSULE | Freq: Four times a day (QID) | ORAL | Status: DC

## 2015-09-04 NOTE — ED Notes (Signed)
X-ray called and informed that pt is ready for x-ray

## 2015-09-04 NOTE — ED Notes (Signed)
Patient transported to X-ray 

## 2015-09-04 NOTE — ED Provider Notes (Signed)
CSN: [id number]     Arrival date & time 09/04/15  1639 History   First MD Initiated Contact with Patient 09/04/15 1653     Chief Complaint  Patient presents with  . Shortness of Breath     (Consider location/radiation/quality/duration/timing/severity/associated sxs/prior Treatment) Patient is a 82 y.o. male presenting with shortness of breath. The history is provided by the patient.  Shortness of Breath Severity:  Moderate Onset quality:  Gradual Duration:  5 days Timing:  Constant Progression:  Worsening Chronicity:  New Context: URI   Relieved by:  Nothing Worsened by:  Nothing tried Ineffective treatments:  Inhaler Associated symptoms: cough and fever   Associated symptoms: no abdominal pain, no chest pain, no headaches, no rash and no vomiting    82 yo M With a chief complaint shortness of breath. This been going on for about 5 days. Patient is a chronic COPD, CHF status post lung cancer with radiation who is on 3 L of oxygen at all times. Patient went to see his family doctor today was found to have an increased oxygen requirement. He thought maybe this was CHF and yesterday took double his dose of Lasix and had good fluid offload with some improvement but continued symptoms. Also is complaining of cough congestion and fevers. Chest x-ray was performed in the office and was concerning for left-sided pneumonia per the notes. He was then sent here for further evaluation. Patient does have a history of PEs and is on lifelong Eliquis. Thinks he may have missed a dose last week but is normally very compliant with the medication.  Past Medical History  Diagnosis Date  . DVT (deep venous thrombosis) ([place])     IN RIGHT ARM 11/2010   . BPH (benign prostatic hyperplasia)   . COPD (chronic obstructive pulmonary disease) ([place])        . Hyperlipidemia   . Allergic rhinitis   . Allergic conjunctivitis   . DJD (degenerative joint disease)     Left Knee  . Pneumonia     history  .  Diverticulosis     hx  . Cellulitis     hx  . Hearing loss   . Cataract   . Diabetic peripheral neuropathy ([place])   . Hypertension   . Obstructive sleep apnea (adult)  10/29/2012    This patient has mild obstructive sleep apnea, tested in March 2014 at [facility] sleep. He had been a CPAP user for 14 years. AHi 10.7 He was titrated to a pressure of 9 cm water( after auto - titration). CPAP at night  --- 8-10 YR AGO....,OSA-diagnosed 2000  . Secondary diabetes with peripheral neuropathy ([place]) 11/07/2012  . Acute diastolic CHF (congestive heart failure) ([place])   . Atrial fibrillation ([place])   . Lung cancer ([place])      history of stage IIIA non-small cell lung cancer who is currently being treated with both  Radiation and Chemotherapy   Past Surgical History  Procedure Laterality Date  . Insertion of a left subclavian port-a-cath.  12/17/10    [place]  . Portacath placement  04/23/2011    Procedure: INSERTION PORT-A-CATH;  Surgeon: Pierre Bali, MD;  Location: [facility];  Service: Thoracic;;  Revision of  Porta-Cath  . Fiberoptic bronchoscopy  12/11/2010  . Video bronchoscopy  09/27/2011    Procedure: VIDEO BRONCHOSCOPY;  Surgeon: Nicanor Alcon, MD;  Location: [place];  Service: Thoracic;  Laterality: N/A;  . Deviated septum repair    . Cardiac catheterization  2006   Family History  Problem Relation Age of Onset  . Coronary artery disease    . Cancer    . Multiple sclerosis    . Diabetes type II    . Anesthesia problems Neg Hx   . Hypotension Neg Hx   . Malignant hyperthermia Neg Hx   . Pseudochol deficiency Neg Hx    Social History  Substance Use Topics  . Smoking status: Former Smoker -- 1.00 packs/day for 50 years    Types: Cigarettes    Start date: 05/18/1943    Quit date: 06/10/1992  . Smokeless tobacco: Never Used     Comment: quit tobacco 3 years ago  . Alcohol Use: No    Review of Systems  Constitutional: Positive for fever and chills.  HENT: Positive for congestion.  Negative for facial swelling.   Eyes: Negative for discharge and visual disturbance.  Respiratory: Positive for cough and shortness of breath.   Cardiovascular: Negative for chest pain and palpitations.  Gastrointestinal: Negative for vomiting, abdominal pain and diarrhea.  Musculoskeletal: Positive for myalgias. Negative for arthralgias.  Skin: Negative for color change and rash.  Neurological: Negative for tremors, syncope and headaches.  Psychiatric/Behavioral: Negative for confusion and dysphoric mood.      Allergies  Review of patient's allergies indicates no known allergies.  Home Medications   Prior to Admission medications   Medication Sig Start Date End Date Taking? Authorizing Provider  amiodarone (PACERONE) 200 MG tablet Take 1 tablet three times a week (Monday, Wednesday, Friday)   Yes Historical Provider, MD  apixaban (ELIQUIS) 5 MG TABS tablet Take 1 tablet (5 mg total) by mouth 2 (two) times daily. 11/12/12  Yes Jacolyn Reedy, MD  atorvastatin (LIPITOR) 20 MG tablet Take 1 tablet (20 mg total) by mouth daily. Patient taking differently: Take 20 mg by mouth at bedtime.  11/12/12  Yes Jacolyn Reedy, MD  cetirizine (ZYRTEC) 10 MG tablet Take 10 mg by mouth daily.    Yes Historical Provider, MD  diltiazem (CARDIZEM CD) 120 MG 24 hr capsule Take 120 mg by mouth daily.   Yes Historical Provider, MD  Exenatide ER (BYDUREON) 2 MG PEN Inject 2 mg into the skin once a week. On Sundays   Yes Historical Provider, MD  furosemide (LASIX) 40 MG tablet Take 1 tablet (40 mg total) by mouth 2 (two) times daily. 05/11/15  Yes Thurnell Lose, MD  glucosamine-chondroitin 500-400 MG tablet Take 1 tablet by mouth 2 (two) times daily.    Yes Historical Provider, MD  Insulin Detemir (LEVEMIR FLEXTOUCH) 100 UNIT/ML Pen Inject 12-16 Units into the skin 2 (two) times daily as needed (CBG >100). If CBG >100 inject 16 units subcutaneously with breakfast and 12 units with supper   Yes Historical  Provider, MD  oxybutynin (DITROPAN) 5 MG tablet Take 5 mg by mouth 2 (two) times daily.   Yes Historical Provider, MD  PROAIR HFA 108 (90 BASE) MCG/ACT inhaler INHALE TWO PUFFS INTO THE LUNGS EVERY 6 HOURS AS NEEDED FOR WHEEZING AND FOR SHORTNESS OF BREATH 10/25/14  Yes Collene Gobble, MD  azithromycin (ZITHROMAX) 250 MG tablet Take 1 tablet (250 mg total) by mouth daily. Take first 2 tablets together, then 1 every day until finished. 09/04/15   Deno Etienne, DO  cephALEXin (KEFLEX) 500 MG capsule Take 1 capsule (500 mg total) by mouth 4 (four) times daily. 09/04/15   Deno Etienne, DO  temazepam (RESTORIL) 15 MG capsule TAKE ONE CAPSULE  BY MOUTH AT BEDTIME AS NEEDED FOR SLEEP Patient not taking: Reported on 09/04/2015 02/02/14   Volanda Napoleon, MD   BP 101/58 mmHg  Pulse 70  Temp(Src) 97.8 F (36.6 C) (Oral)  Resp 21  Wt 227 lb 3 oz (103.052 kg)  SpO2 92% Physical Exam  Constitutional: He is oriented to person, place, and time. He appears well-developed and well-nourished.  HENT:  Head: Normocephalic and atraumatic.  Eyes: EOM are normal. Pupils are equal, round, and reactive to light.  Neck: Normal range of motion. Neck supple. No JVD present.  Cardiovascular: Normal rate and regular rhythm.  Exam reveals no gallop and no friction rub.   No murmur heard. Pulmonary/Chest: No respiratory distress. He has no wheezes. He has rhonchi in the left upper field and the left middle field.  Abdominal: He exhibits no distension. There is no rebound and no guarding.  Musculoskeletal: Normal range of motion.  Neurological: He is alert and oriented to person, place, and time.  Skin: No rash noted. No pallor.  Psychiatric: He has a normal mood and affect. His behavior is normal.  Nursing note and vitals reviewed.   ED Course  Procedures (including critical care time) Labs Review Labs Reviewed  BASIC METABOLIC PANEL - Abnormal; Notable for the following:    Chloride 100 (*)    Glucose, Bld 159 (*)     Creatinine, Ser 1.49 (*)    Calcium 8.7 (*)    GFR calc non Af Amer 40 (*)    GFR calc Af Amer 46 (*)    All other components within normal limits  CBC - Abnormal; Notable for the following:    WBC 13.6 (*)    Hemoglobin 12.2 (*)    HCT 37.8 (*)    RDW 15.7 (*)    All other components within normal limits  PROTIME-INR - Abnormal; Notable for the following:    Prothrombin Time 18.4 (*)    INR 1.52 (*)    All other components within normal limits  BRAIN NATRIURETIC PEPTIDE - Abnormal; Notable for the following:    B Natriuretic Peptide 135.4 (*)    All other components within normal limits  I-STAT TROPOININ, ED    Imaging Review Dg Chest 2 View  09/04/2015  CLINICAL DATA:  Shortness of breath. EXAM: CHEST  2 VIEW COMPARISON:  May 10, 2015. FINDINGS: Stable cardiomediastinal silhouette. Left subclavian Port-A-Cath is unchanged in position. No pneumothorax is noted. Stable mild bilateral pleural effusions are noted. Left lung is clear. Stable right basilar opacity is noted concerning for scarring, edema or pneumonia. Multilevel degenerative disc disease is noted in the thoracic spine. IMPRESSION: Stable mild bilateral pleural effusions. Stable right basilar opacity concerning for scarring, edema or pneumonia. Electronically Signed   By: Marijo Conception, M.D.   On: 09/04/2015 19:24   I have personally reviewed and evaluated these images and lab results as part of my medical decision-making.   EKG Interpretation   Date/Time:  Monday September 04 2015 16:51:54 EDT Ventricular Rate:  76 PR Interval:  147 QRS Duration: 116 QT Interval:  414 QTC Calculation: 465 R Axis:   75 Text Interpretation:  Sinus rhythm Nonspecific intraventricular conduction  delay Nonspecific T abnormalities, lateral leads No significant change  since last tracing Confirmed by Finnlee Guarnieri MD, Quillian Quince ([id number]) on 09/04/2015  5:34:27 PM      MDM   Final diagnoses:  CAP (community acquired pneumonia)    82 yo M  with a chief complaint  shortness of breath. Unable to visualize x-ray done in the office. Patient requiring 4 L of oxygen satting in the low 90s. Will obtain a chest x-ray CBC CMP BNP troponin EKG. Will trial breathing treatments.  Patient reassessed feeling a little better.  Discussed patients curb-65 as well as PORT score.  Patient feels comfortable with a 9% mortality and is requesting outpatient therapy.  Given keflex and azithro as this is what he had previously for pneumonia and he felt that worked well with his other medications.   9:30 PM:  I have discussed the diagnosis/risks/treatment options with the patient and family and believe the pt to be eligible for discharge home to follow-up with PCP. We also discussed returning to the ED immediately if new or worsening sx occur. We discussed the sx which are most concerning (e.g., sudden worsening pain, fever, inability to tolerate by mouth) that necessitate immediate return. Medications administered to the patient during their visit and any new prescriptions provided to the patient are listed below.  Medications given during this visit Medications  ipratropium-albuterol (DUONEB) 0.5-2.5 (3) MG/3ML nebulizer solution 3 mL (3 mLs Nebulization Given 09/04/15 1829)    Discharge Medication List as of 09/04/2015  7:38 PM    START taking these medications   Details  azithromycin (ZITHROMAX) 250 MG tablet Take 1 tablet (250 mg total) by mouth daily. Take first 2 tablets together, then 1 every day until finished., Starting 09/04/2015, Until Discontinued, Print    cephALEXin (KEFLEX) 500 MG capsule Take 1 capsule (500 mg total) by mouth 4 (four) times daily., Starting 09/04/2015, Until Discontinued, Print        The patient appears reasonably screen and/or stabilized for discharge and I doubt any other medical condition or other [facility] requiring further screening, evaluation, or treatment in the ED at this time prior to discharge.      Deno Etienne,  DO 09/04/15 2130

## 2015-09-04 NOTE — Discharge Instructions (Signed)
Follow up with your family doc in a couple days for recheck.  Community-Acquired Pneumonia, Adult Pneumonia is an infection of the lungs. One type of pneumonia can happen while a person is in a hospital. A different type can happen when a person is not in a hospital (community-acquired pneumonia). It is easy for this kind to spread from person to person. It can spread to you if you breathe near an infected person who coughs or sneezes. Some symptoms include:  A dry cough.  A wet (productive) cough.  Fever.  Sweating.  Chest pain. HOME CARE  Take over-the-counter and prescription medicines only as told by your doctor.  Only take cough medicine if you are losing sleep.  If you were prescribed an antibiotic medicine, take it as told by your doctor. Do not stop taking the antibiotic even if you start to feel better.  Sleep with your head and neck raised (elevated). You can do this by putting a few pillows under your head, or you can sleep in a recliner.  Do not use tobacco products. These include cigarettes, chewing tobacco, and e-cigarettes. If you need help quitting, ask your doctor.  Drink enough water to keep your pee (urine) clear or pale yellow. A shot (vaccine) can help prevent pneumonia. Shots are often suggested for:  People older than 80 years of age.  People older than 80 years of age:  Who are having cancer treatment.  Who have long-term (chronic) lung disease.  Who have problems with their body's defense system (immune system). You may also prevent pneumonia if you take these actions:  Get the flu (influenza) shot every year.  Go to the dentist as often as told.  Wash your hands often. If soap and water are not available, use hand sanitizer. GET HELP IF:  You have a fever.  You lose sleep because your cough medicine does not help. GET HELP RIGHT AWAY IF:  You are short of breath and it gets worse.  You have more chest pain.  Your sickness gets worse.  This is very serious if:  You are an older adult.  Your body's defense system is weak.  You cough up blood.   This information is not intended to replace advice given to you by your health care provider. Make sure you discuss any questions you have with your health care provider.   Document Released: 11/13/2007 Document Revised: 02/15/2015 Document Reviewed: 09/21/2014 Elsevier Interactive Patient Education [employer].

## 2015-09-04 NOTE — ED Notes (Signed)
Pt verbalized understanding of d/c instructions about antibiotics. Pt stable and NAD upon d/c. Pt d/c home with wife driving.

## 2015-09-04 NOTE — ED Notes (Signed)
Pt here from pcp for sob that began saturday and has progressively gotten worse since. Pt able to speak in complete sentences. Pt states that he has been having chills and low grade fever of 100 since yesterday. Pt has hx of COPD and CHF. Pt alert and oriented x 4. Coarse crackles ausculated bilaterally.

## 2015-09-07 ENCOUNTER — Inpatient Hospital Stay: Payer: Medicare Other | Admitting: Emergency Medicine

## 2015-10-04 ENCOUNTER — Ambulatory Visit (HOSPITAL_BASED_OUTPATIENT_CLINIC_OR_DEPARTMENT_OTHER): Payer: Medicare Other

## 2015-10-04 VITALS — BP 116/61 | HR 66 | Temp 97.7°F | Resp 20

## 2015-10-04 DIAGNOSIS — Z85118 Personal history of other malignant neoplasm of bronchus and lung: Secondary | ICD-10-CM | POA: Diagnosis not present

## 2015-10-04 DIAGNOSIS — Z452 Encounter for adjustment and management of vascular access device: Secondary | ICD-10-CM

## 2015-10-04 DIAGNOSIS — C3490 Malignant neoplasm of unspecified part of unspecified bronchus or lung: Secondary | ICD-10-CM

## 2015-10-04 MED ORDER — HEPARIN SOD (PORK) LOCK FLUSH 100 UNIT/ML IV SOLN
500.0000 [IU] | Freq: Once | INTRAVENOUS | Status: AC
Start: 2015-10-04 — End: 2015-10-04
  Administered 2015-10-04: 500 [IU] via INTRAVENOUS
  Filled 2015-10-04: qty 5

## 2015-10-04 MED ORDER — SODIUM CHLORIDE 0.9% FLUSH
10.0000 mL | INTRAVENOUS | Status: DC | PRN
  Administered 2015-10-04: 10 mL via INTRAVENOUS
  Filled 2015-10-04: qty 10

## 2015-10-04 NOTE — Patient Instructions (Signed)

## 2015-11-29 ENCOUNTER — Ambulatory Visit (HOSPITAL_BASED_OUTPATIENT_CLINIC_OR_DEPARTMENT_OTHER): Payer: Medicare Other

## 2015-11-29 DIAGNOSIS — Z452 Encounter for adjustment and management of vascular access device: Secondary | ICD-10-CM | POA: Diagnosis not present

## 2015-11-29 DIAGNOSIS — Z85118 Personal history of other malignant neoplasm of bronchus and lung: Secondary | ICD-10-CM | POA: Diagnosis not present

## 2015-11-29 DIAGNOSIS — C3490 Malignant neoplasm of unspecified part of unspecified bronchus or lung: Secondary | ICD-10-CM

## 2015-11-29 MED ORDER — SODIUM CHLORIDE 0.9% FLUSH
10.0000 mL | INTRAVENOUS | Status: DC | PRN
  Administered 2015-11-29: 10 mL via INTRAVENOUS
  Filled 2015-11-29: qty 10

## 2015-11-29 MED ORDER — HEPARIN SOD (PORK) LOCK FLUSH 100 UNIT/ML IV SOLN
500.0000 [IU] | Freq: Once | INTRAVENOUS | Status: AC
Start: 2015-11-29 — End: 2015-11-29
  Administered 2015-11-29: 500 [IU] via INTRAVENOUS
  Filled 2015-11-29: qty 5

## 2015-11-29 NOTE — Patient Instructions (Signed)

## 2016-01-03 ENCOUNTER — Ambulatory Visit: Payer: Medicare Other | Admitting: Adult Health

## 2016-01-03 ENCOUNTER — Ambulatory Visit: Payer: Medicare Other | Admitting: Neurology

## 2016-01-04 ENCOUNTER — Other Ambulatory Visit: Payer: Self-pay | Admitting: Internal Medicine

## 2016-01-04 DIAGNOSIS — R0789 Other chest pain: Secondary | ICD-10-CM

## 2016-01-05 ENCOUNTER — Ambulatory Visit
Admission: RE | Admit: 2016-01-05 | Discharge: 2016-01-05 | Disposition: A | Discharge status: Home / Self Care | Payer: Medicare Other | Source: Ambulatory Visit | Attending: Internal Medicine | Admitting: Internal Medicine

## 2016-01-05 DIAGNOSIS — R0789 Other chest pain: Secondary | ICD-10-CM

## 2016-01-05 MED ORDER — IOPAMIDOL (ISOVUE-300) INJECTION 61%
75.0000 mL | Freq: Once | INTRAVENOUS | Status: AC | PRN
  Administered 2016-01-05: 75 mL via INTRAVENOUS

## 2016-02-14 ENCOUNTER — Ambulatory Visit (HOSPITAL_BASED_OUTPATIENT_CLINIC_OR_DEPARTMENT_OTHER): Payer: Medicare Other

## 2016-02-14 ENCOUNTER — Encounter: Payer: Self-pay | Admitting: Hematology & Oncology

## 2016-02-14 ENCOUNTER — Ambulatory Visit (HOSPITAL_BASED_OUTPATIENT_CLINIC_OR_DEPARTMENT_OTHER): Payer: Medicare Other | Admitting: Hematology & Oncology

## 2016-02-14 ENCOUNTER — Other Ambulatory Visit: Payer: Self-pay | Admitting: Pulmonary Disease

## 2016-02-14 ENCOUNTER — Other Ambulatory Visit (HOSPITAL_BASED_OUTPATIENT_CLINIC_OR_DEPARTMENT_OTHER): Payer: Medicare Other

## 2016-02-14 ENCOUNTER — Other Ambulatory Visit: Payer: Self-pay | Admitting: Emergency Medicine

## 2016-02-14 ENCOUNTER — Ambulatory Visit (HOSPITAL_BASED_OUTPATIENT_CLINIC_OR_DEPARTMENT_OTHER)
Admission: RE | Admit: 2016-02-14 | Discharge: 2016-02-14 | Disposition: A | Discharge status: Home / Self Care | Payer: Medicare Other | Source: Ambulatory Visit | Attending: Hematology & Oncology | Admitting: Hematology & Oncology

## 2016-02-14 VITALS — BP 116/63 | HR 80 | Temp 98.0°F | Resp 20 | Wt 216.6 lb

## 2016-02-14 DIAGNOSIS — C3490 Malignant neoplasm of unspecified part of unspecified bronchus or lung: Secondary | ICD-10-CM

## 2016-02-14 DIAGNOSIS — Z86718 Personal history of other venous thrombosis and embolism: Secondary | ICD-10-CM

## 2016-02-14 DIAGNOSIS — R64 Cachexia: Secondary | ICD-10-CM | POA: Diagnosis not present

## 2016-02-14 DIAGNOSIS — Z85118 Personal history of other malignant neoplasm of bronchus and lung: Secondary | ICD-10-CM | POA: Diagnosis not present

## 2016-02-14 DIAGNOSIS — C3401 Malignant neoplasm of right main bronchus: Secondary | ICD-10-CM

## 2016-02-14 DIAGNOSIS — D508 Other iron deficiency anemias: Secondary | ICD-10-CM | POA: Diagnosis not present

## 2016-02-14 DIAGNOSIS — J449 Chronic obstructive pulmonary disease, unspecified: Secondary | ICD-10-CM | POA: Insufficient documentation

## 2016-02-14 DIAGNOSIS — R634 Abnormal weight loss: Secondary | ICD-10-CM

## 2016-02-14 DIAGNOSIS — E1142 Type 2 diabetes mellitus with diabetic polyneuropathy: Secondary | ICD-10-CM

## 2016-02-14 DIAGNOSIS — C342 Malignant neoplasm of middle lobe, bronchus or lung: Secondary | ICD-10-CM

## 2016-02-14 LAB — CBC WITH DIFFERENTIAL (CANCER CENTER ONLY)
BASO#: 0 10*3/uL (ref 0.0–0.2)
BASO%: 0.4 % (ref 0.0–2.0)
EOS%: 4.7 % (ref 0.0–7.0)
Eosinophils Absolute: 0.4 10*3/uL (ref 0.0–0.5)
HCT: 36.8 % — ABNORMAL LOW (ref 38.7–49.9)
HGB: 12 g/dL — ABNORMAL LOW (ref 13.0–17.1)
LYMPH#: 1.3 10*3/uL (ref 0.9–3.3)
LYMPH%: 15.8 % (ref 14.0–48.0)
MCH: 27.2 pg — ABNORMAL LOW (ref 28.0–33.4)
MCHC: 32.6 g/dL (ref 32.0–35.9)
MCV: 83 fL (ref 82–98)
MONO#: 0.8 10*3/uL (ref 0.1–0.9)
MONO%: 9.9 % (ref 0.0–13.0)
NEUT#: 5.5 10*3/uL (ref 1.5–6.5)
NEUT%: 69.2 % (ref 40.0–80.0)
Platelets: 251 10*3/uL (ref 145–400)
RBC: 4.41 10*6/uL (ref 4.20–5.70)
RDW: 17.1 % — ABNORMAL HIGH (ref 11.1–15.7)
WBC: 7.9 10*3/uL (ref 4.0–10.0)

## 2016-02-14 LAB — CMP (CANCER CENTER ONLY)
ALT(SGPT): 27 U/L (ref 10–47)
AST: 36 U/L (ref 11–38)
Albumin: 2.8 g/dL — ABNORMAL LOW (ref 3.3–5.5)
Alkaline Phosphatase: 83 U/L (ref 26–84)
BUN, Bld: 19 mg/dL (ref 7–22)
CO2: 33 meq/L (ref 18–33)
Calcium: 8.9 mg/dL (ref 8.0–10.3)
Chloride: 97 meq/L — ABNORMAL LOW (ref 98–108)
Creat: 1.3 mg/dL — ABNORMAL HIGH (ref 0.6–1.2)
Glucose, Bld: 179 mg/dL — ABNORMAL HIGH (ref 73–118)
Potassium: 3.8 meq/L (ref 3.3–4.7)
Sodium: 133 meq/L (ref 128–145)
Total Bilirubin: 0.8 mg/dL (ref 0.20–1.60)
Total Protein: 7.3 g/dL (ref 6.4–8.1)

## 2016-02-14 MED ORDER — MEGESTROL ACETATE 400 MG/10ML PO SUSP: 400.0000 mg | Freq: Two times a day (BID) | ORAL | 0 refills | Status: DC

## 2016-02-14 MED ORDER — HEPARIN SOD (PORK) LOCK FLUSH 100 UNIT/ML IV SOLN
500.0000 [IU] | Freq: Once | INTRAVENOUS | Status: AC
  Administered 2016-02-14: 500 [IU] via INTRAVENOUS
  Filled 2016-02-14: qty 5

## 2016-02-14 MED ORDER — SODIUM CHLORIDE 0.9% FLUSH
10.0000 mL | INTRAVENOUS | Status: DC | PRN
  Administered 2016-02-14: 10 mL via INTRAVENOUS
  Filled 2016-02-14: qty 10

## 2016-02-14 NOTE — Patient Instructions (Signed)

## 2016-02-14 NOTE — Progress Notes (Signed)
Hematology and Oncology Follow Up Visit  [name] [id number] January 18, 1925 82 y.o. 02/14/2016   Principle Diagnosis:  . 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 2. Paroxysmal atrial fibrillation. 3. 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  Current Therapy:   Eliquis 5 mg p.o. b.i.d.     Interim History:  [name] is back for followup. He is not doing as well as. He has lost weight. He's lost about 10 pounds since we last saw him. He's having more trouble breathing. He does have bad underlying lung disease.  Is having some abdominal pain. He did have a recent CT scan. This did not show any evidence of cancer recurrence. The scan was done in late July. He had chronic severe interstitial and obstructive lung disease. He had progression of scarring in the right upper lung. He is on supplemental oxygen. He is on 2 L/m. I told him that he could try 3 L/m. I know he sees cardiologist and pulmonologist. His liking may have to go back to see them again.  I thank him quite a bit for the honey that he gave [place]. He gave me a big jar of honey. 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  His appetite is down now. He just does not have much of an appetite. I'm not sure [place] to why this is.  I do, that he is on amiodarone for his atrial fibrillation. I will no this might be causing some issues for him. He has been on it for quite a while.  Overall, his performance status is ECOG 2.  Medications:  Current Outpatient Prescriptions:  .  amiodarone (PACERONE) 200 MG tablet, Take 1 tablet three times a week (Monday, Wednesday, Friday), Disp: , Rfl:  .  apixaban (ELIQUIS) 5 MG TABS tablet, Take 1 tablet (5 mg total) by mouth 2 (two) times daily., Disp: 60 tablet, Rfl: 12 .  atorvastatin (LIPITOR) 20 MG tablet, Take 1 tablet (20 mg total) by mouth daily. (Patient taking differently: Take 20 mg by mouth at bedtime. ), Disp:  30 tablet, Rfl: 12 .  Calcium Carbonate-Vitamin D (CALCIUM 600+D) 600-400 MG-UNIT tablet, Take 1 tablet by mouth daily., Disp: , Rfl:  .  cetirizine (ZYRTEC) 10 MG tablet, Take 10 mg by mouth daily. , Disp: , Rfl:  .  diltiazem (CARDIZEM CD) 120 MG 24 hr capsule, Take 120 mg by mouth daily., Disp: , Rfl:  .  Exenatide ER (BYDUREON) 2 MG PEN, Inject 2 mg into the skin once a week. On Sundays, Disp: , Rfl:  .  furosemide (LASIX) 40 MG tablet, Take 1 tablet (40 mg total) by mouth 2 (two) times daily., Disp: 30 tablet, Rfl: 12 .  glucosamine-chondroitin 500-400 MG tablet, Take 1 tablet by mouth 2 (two) times daily. , Disp: , Rfl:  .  Insulin Detemir (LEVEMIR FLEXTOUCH) 100 UNIT/ML Pen, Inject into the skin 2 (two) times daily as needed (CBG >100). 02/14/2016 If CBG >100 inject 20 units subcutaneously with breakfast and 16 units with supper., Disp: , Rfl:  .  Multiple Vitamin (MULTIVITAMIN) tablet, Take 1 tablet by mouth daily., Disp: , Rfl:  .  oxybutynin (DITROPAN) 5 MG tablet, Take 5 mg by mouth 2 (two) times daily., Disp: , Rfl:  .  temazepam (RESTORIL) 15 MG capsule, TAKE ONE CAPSULE BY MOUTH AT BEDTIME AS NEEDED FOR SLEEP, Disp: 30 capsule,  Rfl: 0 .  megestrol (MEGACE) 400 MG/10ML suspension, Take 10 mLs (400 mg total) by mouth 2 (two) times daily., Disp: 240 mL, Rfl: 0 .  PROAIR HFA 108 (90 Base) MCG/ACT inhaler, INHALE TWO PUFFS BY MOUTH EVERY 6 HOURS AS NEEDED FOR WHEEZING AND FOR SHORTNESS OF BREATH, Disp: 9 each, Rfl: 5  Allergies: No Known Allergies  Past Medical History, Surgical history, Social history, and Family History were reviewed and updated.  Review of Systems: As above  Physical Exam:  weight is 216 lb 9 oz (98.2 kg). His oral temperature is 98 F (36.7 C). His blood pressure is 116/63 and his pulse is 80. His respiration is 20 and oxygen saturation is 92%.   Very [name] gentleman. Head and neck exam shows no ocular or oral lesions. He has no adenopathy in the neck. Lungs are  clear. Cardiac exam regular rate and rhythm this is with atrial fibrillation. He has a 1/6 murmur. Abdomen soft. Has good bowel sounds. There is no palpable abdominal mass. There is no fluid wave. Back exam no tenderness over the spine ribs or hips. Extremities shows some trace chronic edema in his legs. Has good strength. Has good range of motion of his joints. Skin exam shows some actinic keratoses. Some ecchymoses are noted. No petechia. Neurological exam is negative. Lab Results  Component Value Date   WBC 7.9 02/14/2016   HGB 12.0 (L) 02/14/2016   HCT 36.8 (L) 02/14/2016   MCV 83 02/14/2016   PLT 251 02/14/2016     Chemistry      Component Value Date/Time   NA 133 02/14/2016 1000   NA 140 12/03/202017 0924   K 3.8 02/14/2016 1000   K 4.5 12/03/202017 0924   CL 97 (L) 02/14/2016 1000   CO2 33 02/14/2016 1000   CO2 26 12/03/202017 0924   BUN 19 02/14/2016 1000   BUN 23.9 12/03/202017 0924   CREATININE 1.3 (H) 02/14/2016 1000   CREATININE 1.4 (H) 12/03/202017 0924      Component Value Date/Time   CALCIUM 8.9 02/14/2016 1000   CALCIUM 8.6 12/03/202017 0924   ALKPHOS 83 02/14/2016 1000   ALKPHOS 100 12/03/202017 0924   AST 36 02/14/2016 1000   AST 69 (H) 12/03/202017 0924   ALT 27 02/14/2016 1000   ALT 51 12/03/202017 0924   BILITOT 0.80 02/14/2016 1000   BILITOT 0.37 12/03/202017 0924         Impression and Plan: [name] is 82 year old gentleman with a history of locally advanced-stage IIIB-adenocarcinoma of the right lung. He had contralateral mediastinal node involvement. He was treated with radiation and chemotherapy. He had a very nice response. He completed  treatment in December of 2012. Thankfully, His disease has not come back. This I am surprised by because of the extensive nature of his locally advanced disease.  He has cardiac issues. He has pulmonary issues. These clearly are more important for his prognosis now.  I will try him on some Megace to try to help with his  appetite. This will help.  We did do a chest x-ray on him today. This did not show any evidence of recurrent lung cancer. He had no pulmonary edema. He had changes of COPD and chronic lung scarring.  I will like to get him back in about a month or so just to follow-up with his weight loss.  He does have a Port-A-Cath in. I do think that the Port-A-Cath will be useful for him given his other  health issues and the fact that he likely will be hospitalized at some point because of complications of his cardiopulmonary disease.   Volanda Napoleon, MD 9/6/20175:20 PM

## 2016-02-16 ENCOUNTER — Other Ambulatory Visit: Payer: Self-pay | Admitting: Pulmonary Disease

## 2016-02-19 ENCOUNTER — Encounter: Payer: Self-pay | Admitting: Pulmonary Disease

## 2016-02-19 ENCOUNTER — Ambulatory Visit (INDEPENDENT_AMBULATORY_CARE_PROVIDER_SITE_OTHER): Payer: Medicare Other | Admitting: Pulmonary Disease

## 2016-02-19 DIAGNOSIS — R634 Abnormal weight loss: Secondary | ICD-10-CM | POA: Diagnosis not present

## 2016-02-19 DIAGNOSIS — J432 Centrilobular emphysema: Secondary | ICD-10-CM | POA: Diagnosis not present

## 2016-02-19 MED ORDER — UMECLIDINIUM-VILANTEROL 62.5-25 MCG/INH IN AEPB: 1.0000 | INHALATION_SPRAY | Freq: Every day | RESPIRATORY_TRACT | 0 refills | Status: DC

## 2016-02-19 NOTE — Assessment & Plan Note (Signed)
His significant weight loss recently is concerning. It has been associated with some belly swelling.  It's not clear to me if this is somehow related to his dyspnea other than anorexia he has experienced with dyspnea.  Plan: Return in one week, if appetite has improved with improvement in dyspnea then we'll hold off on further imaging, but if not then he may need to have a CT scan of his abdomen to further investigate.

## 2016-02-19 NOTE — Assessment & Plan Note (Addendum)
I believe that [name] is worsening dyspnea over the last several months is directly related to his COPD. I don't have the benefit of being able to see his lung function tests but he is wheezing significantly on exam today.  The differential diagnosis of his dyspnea includes heart failure but the fact that he's lost 20 pounds in the last month would argue against this.  Plan: Start Anoro Continue albuterol as needed Request results of recent BNP test collected by Dr. Thurman Coyer office last week Follow-up one week to ensure he is getting better

## 2016-02-19 NOTE — Patient Instructions (Signed)
Take Anoro one puff daily  Continue using albuterol as you are doing We will request records of the lab work from Dr. Thurman Coyer office We will see you back in one week to make sure you are getting better

## 2016-02-19 NOTE — Addendum Note (Signed)
Addended by: Len Blalock on: 02/19/2016 02:03 PM   Modules accepted: Orders

## 2016-02-19 NOTE — Progress Notes (Signed)
Subjective:    Patient ID: [name], male    DOB: [birth year], 82 y.o.   MRN: [id number]  HPI Chief Complaint  Patient presents with  . Acute Visit    RB pt c/o increased SOB, occasional nonprod cough X2-3 weeks.  Denies sinus congestion, fever, chest pain.     [name] is having more trouble breathing.  > he is comfortable at rest, but walking it is difficult > has progressed in the last few weeks > mostly with exertion, even just a few steps > No chest pain > no leg swelling > some abdominal swelling which is worse > he is taking diuretics which working > he weighs himself daily > he sometimes takes an extra lasix if his weight goes up or if his belly pain has  > he has actually lost 20 pounds in the last month > no more cough than prior > He just started using an inhaler last Thursday  > he doesn't take any Spiriva or controller medicine   Past Medical History:  Diagnosis Date  . Acute diastolic CHF (congestive heart failure) ([place])   . Allergic conjunctivitis   . Allergic rhinitis   . Atrial fibrillation ([place])   . BPH (benign prostatic hyperplasia)   . Cataract   . Cellulitis    hx  . COPD (chronic obstructive pulmonary disease) ([place])       . Diabetic peripheral neuropathy ([place])   . Diverticulosis    hx  . DJD (degenerative joint disease)    Left Knee  . DVT (deep venous thrombosis) ([place])    IN RIGHT ARM 11/2010   . Hearing loss   . Hyperlipidemia   . Hypertension   . Lung cancer ([place])     history of stage IIIA non-small cell lung cancer who is currently being treated with both  Radiation and Chemotherapy  . Obstructive sleep apnea (adult)  10/29/2012   This patient has mild obstructive sleep apnea, tested in March 2014 at [facility]. He had been a CPAP user for 14 years. AHi 10.7 He was titrated to a pressure of 9 cm water( after auto - titration). CPAP at night  --- 8-10 YR AGO....,OSA-diagnosed 2000  . Pneumonia    history  . Secondary diabetes  with peripheral neuropathy ([place]) 11/07/2012      Review of Systems  Constitutional: Negative for chills, fatigue and fever.  HENT: Negative for postnasal drip, rhinorrhea, sinus pressure and sneezing.   Respiratory: Positive for cough and shortness of breath. Negative for stridor.   Cardiovascular: Negative for chest pain, palpitations and leg swelling.       Objective:   Physical Exam Vitals:   02/19/16 1328  BP: 128/66  Pulse: 73  SpO2: 95%  Weight: 220 lb (99.8 kg)  Height: '5\' 10"'$  (1.778 m)  RA  Gen: chronically ill appearing, no acute distress HENT: NCAT, OP clear, neck supple without masses Eyes: PERRL, EOMi Lymph: no cervical lymphadenopathy PULM: Wheezing bilaterally, some rhonchi, good air movement CV: Irreg irreg, no mgr, no JVD GI: BS+, soft, nontender, no hsm Derm: no rash or skin breakdown MSK: normal bulk and tone Neuro: A&Ox4, CN II-XII intact, strength 5/5 in all 4 extremities Psyche: normal mood and affect  Chest x-ray from last week images personally reviewed showing no mass, significant emphysema, no pleural effusion  July 2017 CT chest images personally reviewed showing moderate centrilobular emphysema and trace effusions bilaterally  CBC    Component Value Date/Time  WBC 7.9 02/14/2016 0958   WBC 13.6 (H) 09/04/2015 1700   RBC 4.41 02/14/2016 0958   RBC 4.39 09/04/2015 1700   HGB 12.0 (L) 02/14/2016 0958   HCT 36.8 (L) 02/14/2016 0958   PLT 251 02/14/2016 0958   MCV 83 02/14/2016 0958   MCH 27.2 (L) 02/14/2016 0958   MCH 27.8 09/04/2015 1700   MCHC 32.6 02/14/2016 0958   MCHC 32.3 09/04/2015 1700   RDW 17.1 (H) 02/14/2016 0958   LYMPHSABS 1.3 02/14/2016 0958   MONOABS 0.6 05/08/2015 0211   EOSABS 0.4 02/14/2016 0958   BASOSABS 0.0 02/14/2016 0958          Assessment & Plan:  COPD (chronic obstructive pulmonary disease) ([place]) I believe that [name] is worsening dyspnea over the last several months is directly related to his  COPD. I don't have the benefit of being able to see his lung function tests but he is wheezing significantly on exam today.  The differential diagnosis of his dyspnea includes heart failure but the fact that he's lost 20 pounds in the last month would argue against this.  Plan: Start Anoro Continue albuterol as needed Request results of recent BNP test collected by Dr. Thurman Coyer office last week Follow-up one week to ensure he is getting better  Loss of weight His significant weight loss recently is concerning. It has been associated with some belly swelling.  It's not clear to me if this is somehow related to his dyspnea other than anorexia he has experienced with dyspnea.  Plan: Return in one week, if appetite has improved with improvement in dyspnea then we'll hold off on further imaging, but if not then he may need to have a CT scan of his abdomen to further investigate.    Current Outpatient Prescriptions:  .  apixaban (ELIQUIS) 5 MG TABS tablet, Take 1 tablet (5 mg total) by mouth 2 (two) times daily., Disp: 60 tablet, Rfl: 12 .  Calcium Carbonate-Vitamin D (CALCIUM 600+D) 600-400 MG-UNIT tablet, Take 1 tablet by mouth daily., Disp: , Rfl:  .  cetirizine (ZYRTEC) 10 MG tablet, Take 10 mg by mouth daily. , Disp: , Rfl:  .  diltiazem (CARDIZEM CD) 120 MG 24 hr capsule, Take 120 mg by mouth daily., Disp: , Rfl:  .  Exenatide ER (BYDUREON) 2 MG PEN, Inject 2 mg into the skin once a week. On Sundays, Disp: , Rfl:  .  furosemide (LASIX) 40 MG tablet, Take 1 tablet (40 mg total) by mouth 2 (two) times daily., Disp: 30 tablet, Rfl: 12 .  glucosamine-chondroitin 500-400 MG tablet, Take 1 tablet by mouth 2 (two) times daily. , Disp: , Rfl:  .  Insulin Detemir (LEVEMIR FLEXTOUCH) 100 UNIT/ML Pen, Inject into the skin 2 (two) times daily as needed (CBG >100). 02/14/2016 If CBG >100 inject 20 units subcutaneously with breakfast and 16 units with supper., Disp: , Rfl:  .  levothyroxine (SYNTHROID,  LEVOTHROID) 25 MCG tablet, Take 25 mcg by mouth daily before breakfast., Disp: , Rfl:  .  megestrol (MEGACE) 400 MG/10ML suspension, Take 10 mLs (400 mg total) by mouth 2 (two) times daily., Disp: 240 mL, Rfl: 0 .  Multiple Vitamin (MULTIVITAMIN) tablet, Take 1 tablet by mouth daily., Disp: , Rfl:  .  oxybutynin (DITROPAN) 5 MG tablet, Take 5 mg by mouth 2 (two) times daily., Disp: , Rfl:  .  PROAIR HFA 108 (90 Base) MCG/ACT inhaler, INHALE TWO PUFFS BY MOUTH EVERY 6 HOURS AS NEEDED FOR  WHEEZING AND FOR SHORTNESS OF BREATH, Disp: 9 each, Rfl: 5 .  temazepam (RESTORIL) 15 MG capsule, TAKE ONE CAPSULE BY MOUTH AT BEDTIME AS NEEDED FOR SLEEP, Disp: 30 capsule, Rfl: 0

## 2016-02-26 ENCOUNTER — Encounter: Payer: Self-pay | Admitting: Adult Health

## 2016-02-26 ENCOUNTER — Ambulatory Visit (INDEPENDENT_AMBULATORY_CARE_PROVIDER_SITE_OTHER): Payer: Medicare Other | Admitting: Adult Health

## 2016-02-26 ENCOUNTER — Other Ambulatory Visit (INDEPENDENT_AMBULATORY_CARE_PROVIDER_SITE_OTHER): Payer: Medicare Other

## 2016-02-26 VITALS — BP 134/70 | HR 62 | Temp 98.1°F | Ht 70.0 in | Wt 218.0 lb

## 2016-02-26 DIAGNOSIS — J449 Chronic obstructive pulmonary disease, unspecified: Secondary | ICD-10-CM | POA: Diagnosis not present

## 2016-02-26 LAB — SEDIMENTATION RATE: SED RATE: 36 mm/h — AB (ref 0–20)

## 2016-02-26 MED ORDER — UMECLIDINIUM-VILANTEROL 62.5-25 MCG/INH IN AEPB: 1.0000 | INHALATION_SPRAY | Freq: Every day | RESPIRATORY_TRACT | 5 refills | Status: DC

## 2016-02-26 NOTE — Progress Notes (Signed)
Subjective:    Patient ID: [name], male    DOB: 09/26/1924, 82 y.o.   MRN: [id number]  HPI 82 year old male former smoker followed for obstructive sleep apnea on C Pap, COPD, previous history of lung cancer right middle lobe, treated with chemotherapy and radiation 2993, diastolic congestive heart failure   02/26/2016  Follow up : COPD  Pt returns for 1 week follow up. He was seen last week with several weeks of DOE, dry cough . He was seen by Oncology 9/6 , Chest x-ray on 02/14/2016 showed chronic COPD changes. Patient is followed by Dr. Martha Clan in Oncology , most recent CT chest in July 2017  showed no evidence of re-recurrence of his cancer. Showed a small right pleural effusion, severe chronic interstitial and obstructive lung disease and a slight progression of radiation scarring in the right upper lobe.Buddy Duty on megace from Oncology for anorexia and weight loss. Recommended to see Cards for possible AMiodarone involvement . We do not have recent PFT.  Seen by cardiology on 9/8 Dr. Wynonia Lawman, Amiodarone . Was stopped. Had BNP but records not available . Records requested. Spirometry FEV1 53%, ratio 53, FVC 68%.  Does not feel any different. Is taking ANORO daily.  He denies chest pain , hemoptysis , fever or edema .    Past Medical History:  Diagnosis Date  . Acute diastolic CHF (congestive heart failure) ([place])   . Allergic conjunctivitis   . Allergic rhinitis   . Atrial fibrillation ([place])   . BPH (benign prostatic hyperplasia)   . Cataract   . Cellulitis    hx  . COPD (chronic obstructive pulmonary disease) ([place])       . Diabetic peripheral neuropathy ([place])   . Diverticulosis    hx  . DJD (degenerative joint disease)    Left Knee  . DVT (deep venous thrombosis) ([place])    IN RIGHT ARM 11/2010   . Hearing loss   . Hyperlipidemia   . Hypertension   . Lung cancer ([place])     history of stage IIIA non-small cell lung cancer who is currently being treated with both   Radiation and Chemotherapy  . Obstructive sleep apnea (adult)  10/29/2012   This patient has mild obstructive sleep apnea, tested in March 2014 at [facility] sleep. He had been a CPAP user for 14 years. AHi 10.7 He was titrated to a pressure of 9 cm water( after auto - titration). CPAP at night  --- 8-10 YR AGO....,OSA-diagnosed 2000  . Pneumonia    history  . Secondary diabetes with peripheral neuropathy ([place]) 11/07/2012   Current Outpatient Prescriptions on File Prior to Visit  Medication Sig Dispense Refill  . apixaban (ELIQUIS) 5 MG TABS tablet Take 1 tablet (5 mg total) by mouth 2 (two) times daily. 60 tablet 12  . Calcium Carbonate-Vitamin D (CALCIUM 600+D) 600-400 MG-UNIT tablet Take 1 tablet by mouth daily.    . cetirizine (ZYRTEC) 10 MG tablet Take 10 mg by mouth daily.     Marland Kitchen diltiazem (CARDIZEM CD) 120 MG 24 hr capsule Take 120 mg by mouth daily.    . Exenatide ER (BYDUREON) 2 MG PEN Inject 2 mg into the skin once a week. On Sundays    . furosemide (LASIX) 40 MG tablet Take 1 tablet (40 mg total) by mouth 2 (two) times daily. 30 tablet 12  . glucosamine-chondroitin 500-400 MG tablet Take 1 tablet by mouth 2 (two) times daily.     . Insulin  Detemir (LEVEMIR FLEXTOUCH) 100 UNIT/ML Pen Inject into the skin 2 (two) times daily as needed (CBG >100). 02/14/2016 If CBG >100 inject 20 units subcutaneously with breakfast and 16 units with supper.    . levothyroxine (SYNTHROID, LEVOTHROID) 25 MCG tablet Take 25 mcg by mouth daily before breakfast.    . megestrol (MEGACE) 400 MG/10ML suspension Take 10 mLs (400 mg total) by mouth 2 (two) times daily. 240 mL 0  . Multiple Vitamin (MULTIVITAMIN) tablet Take 1 tablet by mouth daily.    Marland Kitchen oxybutynin (DITROPAN) 5 MG tablet Take 5 mg by mouth 2 (two) times daily.    Marland Kitchen PROAIR HFA 108 (90 Base) MCG/ACT inhaler INHALE TWO PUFFS BY MOUTH EVERY 6 HOURS AS NEEDED FOR WHEEZING AND FOR SHORTNESS OF BREATH 9 each 5  . temazepam (RESTORIL) 15 MG capsule TAKE ONE  CAPSULE BY MOUTH AT BEDTIME AS NEEDED FOR SLEEP 30 capsule 0  . umeclidinium-vilanterol (ANORO ELLIPTA) 62.5-25 MCG/INH AEPB Inhale 1 puff into the lungs daily. 2 each 0   No current facility-administered medications on file prior to visit.     Review of Systems Constitutional:   No  weight loss, night sweats,  Fevers, chills, +fatigue, or  lassitude.  HEENT:   No headaches,  Difficulty swallowing,  Tooth/dental problems, or  Sore throat,                No sneezing, itching, ear ache, nasal congestion, post nasal drip,   CV:  No chest pain,  Orthopnea, PND, swelling in lower extremities, anasarca, dizziness, palpitations, syncope.   GI  No heartburn, indigestion, abdominal pain, nausea, vomiting, diarrhea, change in bowel habits, loss of appetite, bloody stools.   Resp:  no chest wall deformity  Skin: no rash or lesions.  GU: no dysuria, change in color of urine, no urgency or frequency.  No flank pain, no hematuria   MS:  No joint pain or swelling.  No decreased range of motion.  No back pain.  Psych:  No change in mood or affect. No depression or anxiety.  No memory loss.         Objective:   Physical Exam  Vitals:   02/26/16 1410  BP: 134/70  Pulse: 62  Temp: 98.1 F (36.7 C)  TempSrc: Oral  SpO2: 94%  Weight: 218 lb (98.9 kg)  Height: '5\' 10"'$  (1.778 m)   GEN: A/Ox3; pleasant , NAD, elderly chronically ill appearing on O2    HEENT:  [place]/AT,  EACs-clear, TMs-wnl, NOSE-clear, THROAT-clear, no lesions, no postnasal drip or exudate noted.   NECK:  Supple w/ fair ROM; no JVD; normal carotid impulses w/o bruits; no thyromegaly or nodules palpated; no lymphadenopathy.    RESP  Decreased BS in bases , ; w/o, wheezes/ rales/ or rhonchi. no accessory muscle use, no dullness to percussion  CARD:  RRR, no m/r/g  , tr peripheral edema, pulses intact, no cyanosis or clubbing.  GI:   Soft & nt; nml bowel sounds; no organomegaly or masses detected.   Musco: Warm bil, no  deformities or joint swelling noted.   Neuro: alert, no focal deficits noted.    Skin: Warm, no lesions or rashes  Tammy Parrett NP-C  [facility]  02/26/2016       Assessment & Plan:

## 2016-02-26 NOTE — Addendum Note (Signed)
Addended by: Osa Craver on: 02/26/2016 03:39 PM   Modules accepted: Orders

## 2016-02-26 NOTE — Patient Instructions (Addendum)
Continue on ANORO daily  Labs today .  follow up Dr. Lamonte Sakai  In 6 weeks and As needed   Follow up with Primary MD for appetite and weight issues.

## 2016-02-26 NOTE — Assessment & Plan Note (Signed)
Mod to severe COPD on o2 with increased dyspnea ? Etiology  Check ESR ? Amiodarone releated -or radiation pnuemonitis although last was 2012. If elevated could consider short steroid challenge.  Records request for BNP last week  and PFT done in 2014.   Cont on ANORO -give it several weeks .  On Eliquis -PE unlikely    Plan   Patient Instructions  Continue on [facility] daily  Labs today .  follow up Dr. Lamonte Sakai  In 6 weeks and As needed   Follow up with Primary MD for appetite and weight issues.

## 2016-02-27 ENCOUNTER — Telehealth: Payer: Self-pay | Admitting: Adult Health

## 2016-02-27 NOTE — Telephone Encounter (Signed)
Spoke with Debbie and notified of recs per TP  She verbalized understanding and nothing further needed

## 2016-02-27 NOTE — Telephone Encounter (Signed)
Lung function is decreased about 1/2 what is should be at 55%. That means he has little reserve so he will sob with activiites at times.

## 2016-02-27 NOTE — Telephone Encounter (Signed)
Spoke with pt's daughter, Butch Penny. She had a question about pt's spirometry that was done yesterday. States TP told them it was down 55%. Butch Penny wants to know exactly what that means.  TP- please advise. Thanks.

## 2016-02-27 NOTE — Telephone Encounter (Signed)
We discussed with them yesterday that we do not have the results from then  We have requested a copy but do not have yet.

## 2016-02-27 NOTE — Telephone Encounter (Signed)
Called and spoke with pts daughter and she is aware of what TP stated about the 55% but she wanted to know compared to the PFT that was done back in 2014, does this show the decrease is just from the decline in his COPD, or would it have anything to do with his activity decreasing over the last several weeks.  TP please advise. thanks

## 2016-03-01 ENCOUNTER — Telehealth: Payer: Self-pay | Admitting: Pulmonary Disease

## 2016-03-01 NOTE — Telephone Encounter (Signed)
Spoke with pt's daughter, requesting lab results.    Notes Recorded by Melvenia Needles, NP on 02/27/2016 at 2:21 PM EDT Inflammatory marker is only elevated a little  For now no steroids -  Remain off Amiodarone .  Cont w/ ov recs Please contact office for sooner follow up if symptoms do not improve or worsen or seek emergency care  -----------  These were relayed to pt's daughter.  Pt's daughter also requesting a hard copy of rx and pt assistance forms for Anoro.  Pt's daughter already has forms, but needs [place] to fill out provider portion.  I advised that we'd be happy to help with this.  Pt's daughter will bring forms by on Monday for [place] to fill out.    Pt's daughter is lastly requesting that RB compare pt's most recent PFT to his PFT performed in 2014 and advise if his pulmonary function has changed.    RB please advise.  Thanks.

## 2016-03-01 NOTE — Telephone Encounter (Signed)
Daughter Butch Penny [phone] calling back

## 2016-03-01 NOTE — Telephone Encounter (Signed)
error 

## 2016-03-01 NOTE — Telephone Encounter (Signed)
ATC fast busy signal x 2. wcb

## 2016-03-01 NOTE — Progress Notes (Signed)
lmtcb x1 

## 2016-03-04 NOTE — Telephone Encounter (Signed)
Attempted to contact pt's daughter, Butch Penny. Received fast busy signal x2. Will try back later.

## 2016-03-04 NOTE — Telephone Encounter (Signed)
Please let her know that I compared his spirometry from 02/26/16 to PFT from 2014. There has been almost no change over that period of time.

## 2016-03-05 ENCOUNTER — Telehealth: Payer: Self-pay | Admitting: Emergency Medicine

## 2016-03-05 MED ORDER — UMECLIDINIUM-VILANTEROL 62.5-25 MCG/INH IN AEPB: 1.0000 | INHALATION_SPRAY | Freq: Every day | RESPIRATORY_TRACT | 3 refills | Status: DC

## 2016-03-05 MED ORDER — UMECLIDINIUM-VILANTEROL 62.5-25 MCG/INH IN AEPB: 1.0000 | INHALATION_SPRAY | Freq: Every day | RESPIRATORY_TRACT | 5 refills | Status: DC

## 2016-03-05 NOTE — Telephone Encounter (Signed)
Patient daughter returning call - she is on the way here to pick up rx as well - pr

## 2016-03-05 NOTE — Telephone Encounter (Signed)
Spoke with daughter. Anoro was suppose to be for 90 day supply. I have reprinted RX. Tp signed this and I faxed this back with the patient assistance forms. These were placed in TP scan folder. Nothing further needed

## 2016-03-05 NOTE — Telephone Encounter (Signed)
Spoke with pt's daughter. She brought patient assistance for Anoro to be faxed out. She states a printed rx needs to be signed for the patient assistance. I also reviewed results and recs from spirometry. She voiced understanding and had no further questions. Patient assistance was faxed to [phone]. Nothing further need at this time.

## 2016-03-05 NOTE — Telephone Encounter (Signed)
LMTCB for [name].

## 2016-03-05 NOTE — Addendum Note (Signed)
Addended by: Inge Rise on: 03/05/2016 03:30 PM   Modules accepted: Orders

## 2016-03-07 ENCOUNTER — Ambulatory Visit (INDEPENDENT_AMBULATORY_CARE_PROVIDER_SITE_OTHER): Payer: Medicare Other | Admitting: Emergency Medicine

## 2016-03-07 ENCOUNTER — Encounter: Payer: Self-pay | Admitting: Emergency Medicine

## 2016-03-07 ENCOUNTER — Telehealth: Payer: Self-pay | Admitting: *Deleted

## 2016-03-07 ENCOUNTER — Ambulatory Visit (INDEPENDENT_AMBULATORY_CARE_PROVIDER_SITE_OTHER)
Admission: RE | Admit: 2016-03-07 | Discharge: 2016-03-07 | Disposition: A | Discharge status: Home / Self Care | Payer: Medicare Other | Source: Ambulatory Visit | Attending: Emergency Medicine | Admitting: Emergency Medicine

## 2016-03-07 ENCOUNTER — Other Ambulatory Visit: Payer: Self-pay | Admitting: Family

## 2016-03-07 ENCOUNTER — Other Ambulatory Visit (INDEPENDENT_AMBULATORY_CARE_PROVIDER_SITE_OTHER): Payer: Medicare Other

## 2016-03-07 VITALS — BP 104/60 | HR 119 | Wt 215.0 lb

## 2016-03-07 DIAGNOSIS — J690 Pneumonitis due to inhalation of food and vomit: Secondary | ICD-10-CM | POA: Diagnosis not present

## 2016-03-07 DIAGNOSIS — R634 Abnormal weight loss: Secondary | ICD-10-CM

## 2016-03-07 DIAGNOSIS — G4733 Obstructive sleep apnea (adult) (pediatric): Secondary | ICD-10-CM | POA: Diagnosis not present

## 2016-03-07 DIAGNOSIS — C3401 Malignant neoplasm of right main bronchus: Secondary | ICD-10-CM

## 2016-03-07 DIAGNOSIS — R1084 Generalized abdominal pain: Secondary | ICD-10-CM

## 2016-03-07 DIAGNOSIS — R112 Nausea with vomiting, unspecified: Secondary | ICD-10-CM

## 2016-03-07 LAB — BASIC METABOLIC PANEL
BUN: 25 mg/dL — ABNORMAL HIGH (ref 6–23)
CHLORIDE: 100 meq/L (ref 96–112)
CO2: 29 mEq/L (ref 19–32)
CREATININE: 1.35 mg/dL (ref 0.40–1.50)
Calcium: 9 mg/dL (ref 8.4–10.5)
GFR: 52.82 mL/min — ABNORMAL LOW (ref >60.00)
Glucose, Bld: 168 mg/dL — ABNORMAL HIGH (ref 70–99)
POTASSIUM: 4.1 meq/L (ref 3.5–5.1)
SODIUM: 138 meq/L (ref 135–145)

## 2016-03-07 LAB — CBC WITH DIFFERENTIAL/PLATELET
BASOS ABS: 0 10*3/uL (ref 0.0–0.1)
Basophils Relative: 0.3 % (ref 0.0–3.0)
EOS ABS: 0.2 10*3/uL (ref 0.0–0.7)
Eosinophils Relative: 1.5 % (ref 0.0–5.0)
HCT: 38.8 % — ABNORMAL LOW (ref 39.0–52.0)
Hemoglobin: 12.7 g/dL — ABNORMAL LOW (ref 13.0–17.0)
LYMPHS ABS: 2.2 10*3/uL (ref 0.7–4.0)
Lymphocytes Relative: 16 % (ref 12.0–46.0)
MCHC: 32.7 g/dL (ref 30.0–36.0)
MCV: 79.8 fl (ref 78.0–100.0)
MONO ABS: 1.1 10*3/uL — AB (ref 0.1–1.0)
MONOS PCT: 8.3 % (ref 3.0–12.0)
NEUTROS ABS: 10 10*3/uL — AB (ref 1.4–7.7)
NEUTROS PCT: 73.9 % (ref 43.0–77.0)
PLATELETS: 287 10*3/uL (ref 150.0–400.0)
RBC: 4.86 Mil/uL (ref 4.22–5.81)
RDW: 17.6 % — ABNORMAL HIGH (ref 11.5–15.5)
WBC: 13.5 10*3/uL — ABNORMAL HIGH (ref 4.0–10.5)

## 2016-03-07 MED ORDER — LEVOFLOXACIN 500 MG PO TABS: 500.0000 mg | ORAL_TABLET | Freq: Every day | ORAL | 0 refills | Status: DC

## 2016-03-07 MED ORDER — PREDNISONE 10 MG PO TABS: ORAL_TABLET | ORAL | 0 refills | Status: DC

## 2016-03-07 NOTE — Assessment & Plan Note (Signed)
With an apparent acute exacerbation and probably community acquired pneumonia. We will check a chest x-ray, CBC, treat empirically with levofloxacin and prednisone taper. Continue his Anoro as ordered. Albuterol when necessary.

## 2016-03-07 NOTE — Patient Instructions (Addendum)
Please take levaquin '500mg'$  daily for the next 7 days.  Take prednisone as directed until completely gone.  We will repeat a CXR today Labwork today Continue Anoro once a day  Take albuterol 2 puffs up to every 4 hours if needed for shortness of breath.  Continue oxygen at  Follow with Dr Lamonte Sakai or NP in 2-3 weeks to assess your improvement.

## 2016-03-07 NOTE — Telephone Encounter (Signed)
Patient's daughter calling the office concerned that patient is still having trouble with decreased appetite, weight loss and some n/v. She is concerned that patient's cancer may have spread to his stomach. They are wanting a CT to evaluate.  Spoke with Dr Marin Olp who is okay with ordering a CT to evaluate symptoms. Order will be placed.  Daughter aware of plan and we will help to schedule appointment.

## 2016-03-07 NOTE — Progress Notes (Signed)
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 Was admitted 5/31 with acute dyspnea, found to be in A fib + RVR. Started on amiodarone. Converted to NSR 11/11/12. Scheduled for repeat Ct scan chest (Dr Katheran Awe) for surveillance on 11/25/12. During his hospitalization, we started the eval for his presumed COPD.  He has been on Spiriva before, SABA before, didn't notice any benefit.   ROV 02/03/13 -- Hx COPD, OSA on CPAP, adenoCA. Last time we tried anoro to see if she would benefit. He isn't sure that the medication is doing anything. He does feel stronger and has little cough or wheeze. He is wearing o2 on 2L/min at all times. Wears CPAP reliably.   ROV 08/25/13 -- follows up for COPD, OSA w good CPAP compliance, hx RML adenoCA (stable CT scan 12/14), DVT, HTN. We tried to get him a portable concentrator, but it was larger than he thought and was too bulky. He is back on O2 tanks 2L/min. He has some exertional SOB especially w stairs. He does not believe that Anoro helped, not interested in an alternative inhaled right now. No flares, no AE's, no pred/abx.   ROV 02/02/14 -- follows for COPD, OSA on CPAP, hx adenoCA RML. He is due for a CT scan in 03/2014. He has not really benefited from scheduled BD's. He uses ProAir rarely for wheeze and in prep for exertion.   ROV 08/12/14 -- follow up visit for his COPD, OSA on CPAP, RML adenoCA. He is still having the same stable dyspnea, limits his activity. No AE COPD but he has had flares of A fib that has lead to some volume overload. He sees Dr Wynonia Lawman for this, has had his diuretics adjusted. He is not on any BD's because they have never seemed to help.  He is getting serial CXR's w Dr Katheran Awe.   ROV 08/14/15 -- follow-up visit for history of COPD, OSA on CPAP. He also has a history of adenocarcinoma of the lung followed by Dr Katheran Awe, treated with chemoradiation. He has not  any evidence clinically of recurrence.  He was admitted end of 11/16 with CAP and Ae-COPD. No CT chest in several years, CXR without evidence recurrence in December. He is not on scheduled BD, didn't respond to Spiriva.   ROV 03/07/16 -- patient has a history of COPD, obstructive sleep apnea and non-small cell lung cancer (adenocarcinoma) that was treated with chemoradiation. Most recent CT scan of the chest was reassuring for no recurrence of disease in July 2017. He was seen here earlier this month with significant wheezing, progressive dyspnea. He had not been on scheduled bronchodilators and these were retried as he started Anoro. He continues to have severe exertional SOB. He is not clear that the Anoro has helped him any. He continues to have wheeze. He has had some chills over the last 2 days.    Vitals:   03/07/16 1350  BP: 104/60  BP Location: Right Arm  Cuff Size: Normal  Pulse: (!) 119  SpO2: 90%  Weight: 215 lb (97.5 kg)   Gen: Pleasant, elderly man, in no distress,  normal affect  ENT: No lesions,  mouth clear,  oropharynx clear, no postnasal drip  Neck: No JVD, no TMG, no carotid bruits  Lungs: No use of accessory muscles, bilateral coarse BS, R basilar crackles.   Cardiovascular: RRR, heart sounds normal, no murmur or  gallops, no peripheral edema  Musculoskeletal: No deformities, no cyanosis or clubbing  Neuro: alert, non focal  Skin: Warm, no lesions or rashes    COPD (chronic obstructive pulmonary disease) (HCC) With an apparent acute exacerbation and probably community acquired pneumonia. We will check a chest x-ray, CBC, treat empirically with levofloxacin and prednisone taper. Continue his Anoro as ordered. Albuterol when necessary.  CAP (community acquired pneumonia) Street and physical exam suspect that he has pneumonia. We will treat as above.  Obstructive sleep apnea CPAP daily at bedtime   [name], MD, PhD 03/07/2016, 2:09 PM [facility] [phone] or if no answer [phone]

## 2016-03-07 NOTE — Assessment & Plan Note (Signed)
Street and physical exam suspect that he has pneumonia. We will treat as above.

## 2016-03-07 NOTE — Assessment & Plan Note (Signed)
CPAP daily at bedtime

## 2016-03-08 ENCOUNTER — Telehealth: Payer: Self-pay | Admitting: Emergency Medicine

## 2016-03-08 NOTE — Telephone Encounter (Signed)
Per OV note yesterday w/ RB: Instructions   Please take levaquin '500mg'$  daily for the next 7 days.  Take prednisone as directed until completely gone.  We will repeat a CXR today Labwork today Continue Anoro once a day  Take albuterol 2 puffs up to every 4 hours if needed for shortness of breath.  Continue oxygen at  Follow with Dr Lamonte Sakai or NP in 2-3 weeks to assess your improvement  ----  Called spoke with pt daughter. She reports pt is doing better. He has improved, breathing is not as labored. FYI for RB.

## 2016-03-08 NOTE — Telephone Encounter (Signed)
Thank you :)

## 2016-03-13 ENCOUNTER — Telehealth: Payer: Self-pay | Admitting: Emergency Medicine

## 2016-03-13 NOTE — Telephone Encounter (Signed)
Spoke with pt's daughter. She is aware of pt's lab results. Nothing further was needed.

## 2016-03-13 NOTE — Telephone Encounter (Signed)
error 

## 2016-03-13 NOTE — Telephone Encounter (Signed)
lmtcb x1 for pt's daughter.

## 2016-03-13 NOTE — Telephone Encounter (Signed)
Let her know that his WBC was slightly elevated, consistent with inflammation or infection. Other labs were ok

## 2016-03-13 NOTE — Telephone Encounter (Signed)
Notes Recorded by Collene Gobble, MD on 03/08/2016 at 3:42 PM EDT Please let the patient know that his CXR does not show any changes, no evidence PNA. Continue the meds we ordered at his OV ------------------------------------------------------ Spoke with pt's daughter, Butch Penny. She is aware of his CXR results. Butch Penny would like to know the results of his lab work as well.  RB - please advise. Thanks!

## 2016-03-14 ENCOUNTER — Ambulatory Visit (HOSPITAL_BASED_OUTPATIENT_CLINIC_OR_DEPARTMENT_OTHER)
Admission: RE | Admit: 2016-03-14 | Discharge: 2016-03-14 | Disposition: A | Discharge status: Home / Self Care | Payer: Medicare Other | Source: Ambulatory Visit | Attending: Family | Admitting: Family

## 2016-03-14 DIAGNOSIS — I700 Atherosclerosis of aorta: Secondary | ICD-10-CM | POA: Diagnosis not present

## 2016-03-14 DIAGNOSIS — R1084 Generalized abdominal pain: Secondary | ICD-10-CM

## 2016-03-14 DIAGNOSIS — K8020 Calculus of gallbladder without cholecystitis without obstruction: Secondary | ICD-10-CM | POA: Insufficient documentation

## 2016-03-14 DIAGNOSIS — R634 Abnormal weight loss: Secondary | ICD-10-CM | POA: Diagnosis present

## 2016-03-14 DIAGNOSIS — J90 Pleural effusion, not elsewhere classified: Secondary | ICD-10-CM | POA: Insufficient documentation

## 2016-03-14 DIAGNOSIS — C3401 Malignant neoplasm of right main bronchus: Secondary | ICD-10-CM

## 2016-03-14 DIAGNOSIS — R112 Nausea with vomiting, unspecified: Secondary | ICD-10-CM

## 2016-03-15 ENCOUNTER — Telehealth: Payer: Self-pay | Admitting: *Deleted

## 2016-03-15 NOTE — Telephone Encounter (Addendum)
Patient aware of results  ----- Message from Volanda Napoleon, MD sent at 03/15/2016 11:55 AM EDT ----- Call - the CT scan does not show any recurrent cancer!!  pete

## 2016-03-27 ENCOUNTER — Other Ambulatory Visit (HOSPITAL_BASED_OUTPATIENT_CLINIC_OR_DEPARTMENT_OTHER): Payer: Medicare Other

## 2016-03-27 ENCOUNTER — Ambulatory Visit (HOSPITAL_BASED_OUTPATIENT_CLINIC_OR_DEPARTMENT_OTHER): Payer: Medicare Other | Admitting: Hematology & Oncology

## 2016-03-27 ENCOUNTER — Ambulatory Visit (HOSPITAL_BASED_OUTPATIENT_CLINIC_OR_DEPARTMENT_OTHER): Payer: Medicare Other

## 2016-03-27 ENCOUNTER — Encounter: Payer: Self-pay | Admitting: Hematology & Oncology

## 2016-03-27 VITALS — BP 106/64 | HR 70 | Temp 97.4°F | Resp 18 | Ht 70.0 in | Wt 217.0 lb

## 2016-03-27 DIAGNOSIS — D508 Other iron deficiency anemias: Secondary | ICD-10-CM

## 2016-03-27 DIAGNOSIS — Z85118 Personal history of other malignant neoplasm of bronchus and lung: Secondary | ICD-10-CM | POA: Diagnosis not present

## 2016-03-27 DIAGNOSIS — K909 Intestinal malabsorption, unspecified: Secondary | ICD-10-CM

## 2016-03-27 DIAGNOSIS — I480 Paroxysmal atrial fibrillation: Secondary | ICD-10-CM | POA: Diagnosis not present

## 2016-03-27 DIAGNOSIS — R64 Cachexia: Secondary | ICD-10-CM

## 2016-03-27 DIAGNOSIS — Z86718 Personal history of other venous thrombosis and embolism: Secondary | ICD-10-CM

## 2016-03-27 DIAGNOSIS — C3411 Malignant neoplasm of upper lobe, right bronchus or lung: Secondary | ICD-10-CM

## 2016-03-27 DIAGNOSIS — D509 Iron deficiency anemia, unspecified: Secondary | ICD-10-CM

## 2016-03-27 DIAGNOSIS — D500 Iron deficiency anemia secondary to blood loss (chronic): Secondary | ICD-10-CM

## 2016-03-27 DIAGNOSIS — C3401 Malignant neoplasm of right main bronchus: Secondary | ICD-10-CM

## 2016-03-27 DIAGNOSIS — Z95828 Presence of other vascular implants and grafts: Secondary | ICD-10-CM

## 2016-03-27 HISTORY — DX: Iron deficiency anemia, unspecified: D50.9

## 2016-03-27 HISTORY — DX: Intestinal malabsorption, unspecified: K90.9

## 2016-03-27 HISTORY — DX: Iron deficiency anemia secondary to blood loss (chronic): D50.0

## 2016-03-27 LAB — COMPREHENSIVE METABOLIC PANEL
ALT: 17 U/L (ref 0–55)
ANION GAP: 10 meq/L (ref 3–11)
AST: 28 U/L (ref 5–34)
Albumin: 2.9 g/dL — ABNORMAL LOW (ref 3.5–5.0)
Alkaline Phosphatase: 85 U/L (ref 40–150)
BUN: 19.1 mg/dL (ref 7.0–26.0)
CALCIUM: 8.7 mg/dL (ref 8.4–10.4)
CHLORIDE: 104 meq/L (ref 98–109)
CO2: 24 mEq/L (ref 22–29)
CREATININE: 1.3 mg/dL (ref 0.7–1.3)
EGFR: 50 mL/min/{1.73_m2} — AB (ref >90)
Glucose: 199 mg/dl — ABNORMAL HIGH (ref 70–140)
POTASSIUM: 4.2 meq/L (ref 3.5–5.1)
Sodium: 138 mEq/L (ref 136–145)
Total Bilirubin: 0.42 mg/dL (ref 0.20–1.20)
Total Protein: 6.9 g/dL (ref 6.4–8.3)

## 2016-03-27 LAB — CBC WITH DIFFERENTIAL (CANCER CENTER ONLY)
BASO#: 0 10*3/uL (ref 0.0–0.2)
BASO%: 0.2 % (ref 0.0–2.0)
EOS%: 1.8 % (ref 0.0–7.0)
Eosinophils Absolute: 0.2 10*3/uL (ref 0.0–0.5)
HEMATOCRIT: 37.2 % — AB (ref 38.7–49.9)
HGB: 12.3 g/dL — ABNORMAL LOW (ref 13.0–17.1)
LYMPH#: 1.6 10*3/uL (ref 0.9–3.3)
LYMPH%: 15.4 % (ref 14.0–48.0)
MCH: 26.2 pg — ABNORMAL LOW (ref 28.0–33.4)
MCHC: 33.1 g/dL (ref 32.0–35.9)
MCV: 79 fL — ABNORMAL LOW (ref 82–98)
MONO#: 0.8 10*3/uL (ref 0.1–0.9)
MONO%: 7.7 % (ref 0.0–13.0)
NEUT#: 7.7 10*3/uL — ABNORMAL HIGH (ref 1.5–6.5)
NEUT%: 74.9 % (ref 40.0–80.0)
PLATELETS: 211 10*3/uL (ref 145–400)
RBC: 4.69 10*6/uL (ref 4.20–5.70)
RDW: 17.8 % — AB (ref 11.1–15.7)
WBC: 10.2 10*3/uL — AB (ref 4.0–10.0)

## 2016-03-27 LAB — IRON AND TIBC
%SAT: 12 % — ABNORMAL LOW (ref 20–55)
IRON: 48 ug/dL (ref 42–163)
TIBC: 405 ug/dL (ref 202–409)
UIBC: 357 ug/dL (ref 117–376)

## 2016-03-27 LAB — FERRITIN: FERRITIN: 44 ng/mL (ref 22–316)

## 2016-03-27 MED ORDER — HEPARIN SOD (PORK) LOCK FLUSH 100 UNIT/ML IV SOLN
500.0000 [IU] | Freq: Once | INTRAVENOUS | Status: AC
  Administered 2016-03-27: 500 [IU] via INTRAVENOUS
  Filled 2016-03-27: qty 5

## 2016-03-27 MED ORDER — SODIUM CHLORIDE 0.9% FLUSH
10.0000 mL | INTRAVENOUS | Status: DC | PRN
  Administered 2016-03-27: 10 mL via INTRAVENOUS
  Filled 2016-03-27: qty 10

## 2016-03-27 NOTE — Addendum Note (Signed)
Addended by: Burney Gauze R on: 03/27/2016 05:21 PM   Modules accepted: Orders

## 2016-03-27 NOTE — Patient Instructions (Signed)

## 2016-03-27 NOTE — Progress Notes (Addendum)
Hematology and Oncology Follow Up Visit  [name] [id number] 1924-11-15 82 y.o. 03/27/2016   Principle Diagnosis:  . 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 2. Paroxysmal atrial fibrillation. 3. 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 4. Iron deficiency anemia due to malabsorption  Current Therapy:   Eliquis 5 mg p.o. B.i.d. IV Feraheme as indicated     Interim History:  [name] is back for followup. He looks much better. His breathing is doing better. His appetite is doing somewhat better. I think we try him on some Megace to see if that would help. It looks like it has helped.  We did do a CT scan of his abdomen. This was done about 2 weeks ago. This did not show any evidence of malignancy. There were no swollen lymph nodes. He had no hepatic or splenic issues.   He has had no problems with his portable oxygen. This does help him stay mobile.   He does see his pulmonologist. I think he is put on a new inhaler.   He is on amiodarone for his atrial fibrillation. I think that this might be causing some issues for him. He has been on it for quite a while.  Overall, his performance status is ECOG 2.  Medications:  Current Outpatient Prescriptions:  .  apixaban (ELIQUIS) 5 MG TABS tablet, Take 1 tablet (5 mg total) by mouth 2 (two) times daily., Disp: 60 tablet, Rfl: 12 .  Calcium Carbonate-Vitamin D (CALCIUM 600+D) 600-400 MG-UNIT tablet, Take 1 tablet by mouth daily., Disp: , Rfl:  .  cetirizine (ZYRTEC) 10 MG tablet, Take 10 mg by mouth daily. , Disp: , Rfl:  .  diltiazem (CARDIZEM CD) 120 MG 24 hr capsule, Take 120 mg by mouth daily., Disp: , Rfl:  .  Exenatide ER (BYDUREON) 2 MG PEN, Inject 2 mg into the skin once a week. On Sundays, Disp: , Rfl:  .  furosemide (LASIX) 40 MG tablet, Take 1 tablet (40 mg total) by mouth 2 (two) times daily., Disp: 30 tablet, Rfl: 12 .  glucosamine-chondroitin 500-400 MG  tablet, Take 1 tablet by mouth 2 (two) times daily. , Disp: , Rfl:  .  Insulin Detemir (LEVEMIR FLEXTOUCH) 100 UNIT/ML Pen, Inject into the skin 2 (two) times daily as needed (CBG >100). 02/14/2016 If CBG >100 inject 20 units subcutaneously with breakfast and 16 units with supper., Disp: , Rfl:  .  insulin lispro (HUMALOG) 100 UNIT/ML injection, Inject 5 Units into the skin 3 (three) times daily before meals., Disp: , Rfl:  .  levothyroxine (SYNTHROID, LEVOTHROID) 25 MCG tablet, Take 25 mcg by mouth daily before breakfast., Disp: , Rfl:  .  Multiple Vitamin (MULTIVITAMIN) tablet, Take 1 tablet by mouth daily., Disp: , Rfl:  .  oxybutynin (DITROPAN) 5 MG tablet, Take 5 mg by mouth 2 (two) times daily., Disp: , Rfl:  .  PROAIR HFA 108 (90 Base) MCG/ACT inhaler, INHALE TWO PUFFS BY MOUTH EVERY 6 HOURS AS NEEDED FOR WHEEZING AND FOR SHORTNESS OF BREATH, Disp: 9 each, Rfl: 5 .  temazepam (RESTORIL) 15 MG capsule, TAKE ONE CAPSULE BY MOUTH AT BEDTIME AS NEEDED FOR SLEEP, Disp: 30 capsule, Rfl: 0 .  umeclidinium-vilanterol (ANORO ELLIPTA) 62.5-25 MCG/INH AEPB, Inhale 1 puff into the lungs daily., Disp: 3 each, Rfl: 3 .  megestrol (MEGACE) 400 MG/10ML suspension, Take 10 mLs (400 mg total) by mouth 2 (  two) times daily. (Patient not taking: Reported on 03/27/2016), Disp: 240 mL, Rfl: 0  Allergies: No Known Allergies  Past Medical History, Surgical history, Social history, and Family History were reviewed and updated.  Review of Systems: As above  Physical Exam:  height is '5\' 10"'$  (1.778 m) and weight is 217 lb (98.4 kg). His oral temperature is 97.4 F (36.3 C). His blood pressure is 106/64 and his pulse is 70. His respiration is 18 and oxygen saturation is 93%.   Very [name] gentleman. Head and neck exam shows no ocular or oral lesions. He has no adenopathy in the neck. Lungs are clear. Cardiac exam regular rate and rhythm this is with atrial fibrillation. He has a 1/6 murmur. Abdomen soft. Has good bowel  sounds. There is no palpable abdominal mass. There is no fluid wave. Back exam no tenderness over the spine ribs or hips. Extremities shows some trace chronic edema in his legs. Has good strength. Has good range of motion of his joints. Skin exam shows some actinic keratoses. Some ecchymoses are noted. No petechia. Neurological exam is negative. Lab Results  Component Value Date   WBC 10.2 (H) 03/27/2016   HGB 12.3 (L) 03/27/2016   HCT 37.2 (L) 03/27/2016   MCV 79 (L) 03/27/2016   PLT 211 03/27/2016     Chemistry      Component Value Date/Time   NA 138 03/07/2016 1421   NA 133 02/14/2016 1000   NA 140 05/16/2016 0924   K 4.1 03/07/2016 1421   K 3.8 02/14/2016 1000   K 4.5 05/16/2016 0924   CL 100 03/07/2016 1421   CL 97 (L) 02/14/2016 1000   CO2 29 03/07/2016 1421   CO2 33 02/14/2016 1000   CO2 26 05/16/2016 0924   BUN 25 (H) 03/07/2016 1421   BUN 19 02/14/2016 1000   BUN 23.9 05/16/2016 0924   CREATININE 1.35 03/07/2016 1421   CREATININE 1.3 (H) 02/14/2016 1000   CREATININE 1.4 (H) 05/16/2016 0924      Component Value Date/Time   CALCIUM 9.0 03/07/2016 1421   CALCIUM 8.9 02/14/2016 1000   CALCIUM 8.6 05/16/2016 0924   ALKPHOS 83 02/14/2016 1000   ALKPHOS 100 05/16/2016 0924   AST 36 02/14/2016 1000   AST 69 (H) 05/16/2016 0924   ALT 27 02/14/2016 1000   ALT 51 05/16/2016 0924   BILITOT 0.80 02/14/2016 1000   BILITOT 0.37 05/16/2016 0924         Impression and Plan: [name] is 82 year old gentleman with a history of locally advanced-stage IIIB-adenocarcinoma of the right lung. He had contralateral mediastinal node involvement. He was treated with radiation and chemotherapy. He had a very nice response. He completed  treatment in December of 2012. Thankfully, His disease has not come back. This I am surprised by because of the extensive nature of his locally advanced disease.  His iron studies clear show that his iron is on the low side. I think that if we gave him  a dose of Feraheme, I did this would help her mouth. I don't think that oral iron would be effective because of all the medications that he takes. I think with IV iron, we can improve his muscle function and allow him to have possibly better breathing parameters.  He has cardiac issues. He has pulmonary issues. These clearly are more important for his prognosis now.  I'm glad that he looks so good right now. Hopefully, the change in atmosphere with cool, dry  Fall air will help.  I'm glad that his CAT scan did not show any obvious problem.  I'm so grateful for the extra jar of honey that he gave me today. My wife loves to cook with the honey.  We will plan to get him back in 6 months. I think we can flush his Port-A-Cath every 2 months now.     Volanda Napoleon, MD 10/18/201711:44 AM

## 2016-03-28 ENCOUNTER — Other Ambulatory Visit: Payer: Self-pay

## 2016-03-28 ENCOUNTER — Telehealth: Payer: Self-pay

## 2016-03-28 NOTE — Telephone Encounter (Addendum)
-----   Message from Volanda Napoleon, MD sent at 03/27/2016  5:17 PM EDT ----- Call his dgtr- the iron level is very low!!  I think that IV iron will help and keep his energy up over the holidays!!!  Please set this up!![name]  Patient's daughter Hilda Blades aware of above message. Transferred to scheduler for appt. dph

## 2016-04-02 ENCOUNTER — Ambulatory Visit: Payer: Medicare Other

## 2016-04-04 ENCOUNTER — Ambulatory Visit (HOSPITAL_BASED_OUTPATIENT_CLINIC_OR_DEPARTMENT_OTHER): Payer: Medicare Other

## 2016-04-04 ENCOUNTER — Ambulatory Visit: Payer: Medicare Other

## 2016-04-04 VITALS — BP 125/70 | HR 70 | Temp 98.2°F | Resp 18

## 2016-04-04 DIAGNOSIS — K909 Intestinal malabsorption, unspecified: Secondary | ICD-10-CM

## 2016-04-04 DIAGNOSIS — D500 Iron deficiency anemia secondary to blood loss (chronic): Secondary | ICD-10-CM

## 2016-04-04 DIAGNOSIS — D508 Other iron deficiency anemias: Secondary | ICD-10-CM | POA: Diagnosis not present

## 2016-04-04 MED ORDER — SODIUM CHLORIDE 0.9 % IV SOLN
510.0000 mg | Freq: Once | INTRAVENOUS | Status: AC
  Administered 2016-04-04: 510 mg via INTRAVENOUS
  Filled 2016-04-04: qty 17

## 2016-04-04 MED ORDER — SODIUM CHLORIDE 0.9 % IV SOLN
Freq: Once | INTRAVENOUS | Status: AC
  Administered 2016-04-04: 13:00:00 via INTRAVENOUS

## 2016-04-04 NOTE — Patient Instructions (Signed)

## 2016-04-09 ENCOUNTER — Encounter: Payer: Self-pay | Admitting: Emergency Medicine

## 2016-04-09 ENCOUNTER — Ambulatory Visit (INDEPENDENT_AMBULATORY_CARE_PROVIDER_SITE_OTHER): Payer: Medicare Other | Admitting: Emergency Medicine

## 2016-04-09 ENCOUNTER — Ambulatory Visit: Payer: Medicare Other | Admitting: Emergency Medicine

## 2016-04-09 DIAGNOSIS — G4733 Obstructive sleep apnea (adult) (pediatric): Secondary | ICD-10-CM | POA: Diagnosis not present

## 2016-04-09 DIAGNOSIS — Z9989 Dependence on other enabling machines and devices: Secondary | ICD-10-CM

## 2016-04-09 DIAGNOSIS — C3411 Malignant neoplasm of upper lobe, right bronchus or lung: Secondary | ICD-10-CM | POA: Diagnosis not present

## 2016-04-09 DIAGNOSIS — J439 Emphysema, unspecified: Secondary | ICD-10-CM | POA: Diagnosis not present

## 2016-04-09 NOTE — Patient Instructions (Addendum)
Please continue your Anoro once a day  Take albuterol 2 puffs up to every 4 hours if needed for shortness of breath.  Use your oxygen at 2-3L/min Use your CPAP every night with oxygen bled in.  Follow with Dr Lamonte Sakai in 6 months or sooner if you have any problems

## 2016-04-09 NOTE — Assessment & Plan Note (Signed)
Use your CPAP every night with oxygen bled in.

## 2016-04-09 NOTE — Assessment & Plan Note (Signed)
Improved post exacerbation and PNA.   Please continue your Anoro once a day  Take albuterol 2 puffs up to every 4 hours if needed for shortness of breath.  Use your oxygen at 2-3L/min Follow with Dr Lamonte Sakai in 6 months or sooner if you have any problems

## 2016-04-09 NOTE — Addendum Note (Signed)
Addended by: Maryanna Shape A on: 04/09/2016 04:51 PM   Modules accepted: Orders

## 2016-04-09 NOTE — Progress Notes (Signed)
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 Was admitted 5/31 with acute dyspnea, found to be in A fib + RVR. Started on amiodarone. Converted to NSR 11/11/12. Scheduled for repeat Ct scan chest (Dr Katheran Awe) for surveillance on 11/25/12. During his hospitalization, we started the eval for his presumed COPD.  He has been on Spiriva before, SABA before, didn't notice any benefit.   ROV 02/03/13 -- Hx COPD, OSA on CPAP, adenoCA. Last time we tried anoro to see if she would benefit. He isn't sure that the medication is doing anything. He does feel stronger and has little cough or wheeze. He is wearing o2 on 2L/min at all times. Wears CPAP reliably.   ROV 08/25/13 -- follows up for COPD, OSA w good CPAP compliance, hx RML adenoCA (stable CT scan 12/14), DVT, HTN. We tried to get him a portable concentrator, but it was larger than he thought and was too bulky. He is back on O2 tanks 2L/min. He has some exertional SOB especially w stairs. He does not believe that Anoro helped, not interested in an alternative inhaled right now. No flares, no AE's, no pred/abx.   ROV 02/02/14 -- follows for COPD, OSA on CPAP, hx adenoCA RML. He is due for a CT scan in 03/2014. He has not really benefited from scheduled BD's. He uses ProAir rarely for wheeze and in prep for exertion.   ROV 08/12/14 -- follow up visit for his COPD, OSA on CPAP, RML adenoCA. He is still having the same stable dyspnea, limits his activity. No AE COPD but he has had flares of A fib that has lead to some volume overload. He sees Dr Wynonia Lawman for this, has had his diuretics adjusted. He is not on any BD's because they have never seemed to help.  He is getting serial CXR's w Dr Katheran Awe.   ROV 08/14/15 -- follow-up visit for history of COPD, OSA on CPAP. He also has a history of adenocarcinoma of the lung followed by Dr Katheran Awe, treated with chemoradiation. He has not  any evidence clinically of recurrence.  He was admitted end of 11/16 with CAP and Ae-COPD. No CT chest in several years, CXR without evidence recurrence in December. He is not on scheduled BD, didn't respond to Spiriva.   ROV 03/07/16 -- patient has a history of COPD, obstructive sleep apnea and non-small cell lung cancer (adenocarcinoma) that was treated with chemoradiation. Most recent CT scan of the chest was reassuring for no recurrence of disease in July 2017. He was seen here earlier this month with significant wheezing, progressive dyspnea. He had not been on scheduled bronchodilators and these were retried as he started Anoro. He continues to have severe exertional SOB. He is not clear that the Anoro has helped him any. He continues to have wheeze. He has had some chills over the last 2 days.   ROV 04/09/16 -- this is a follow-up visit for history of COPD, obstructive sleep apnea on CPAP, non-small cell lung cancer treated with chemoradiation. I saw him about a month ago with an acute exacerbation in the setting of community-acquired versus aspiration pneumonia. He was treated with antibiotics, steroids. He is improved, probably back to baseline. O2 at 2-3L/min. He is on Anoro, is unclear whether it has helped him much. He uses albuterol about 2-3x a day. He is on zyrtec. Flu shot up to date. He  has very good compliance with his CPAP.    Vitals:   04/09/16 1538  BP: 126/72  BP Location: Left Arm  Cuff Size: Normal  Pulse: 89  SpO2: 90%  Weight: 218 lb (98.9 kg)  Height: '5\' 10"'$  (1.778 m)   Gen: Pleasant, elderly man, in no distress,  normal affect  ENT: No lesions,  mouth clear,  oropharynx clear, no postnasal drip  Neck: No JVD, no TMG, no carotid bruits  Lungs: No use of accessory muscles, bilateral coarse BS, R basilar crackles.   Cardiovascular: RRR, heart sounds normal, no murmur or gallops, no peripheral edema  Musculoskeletal: No deformities, no cyanosis or clubbing  Neuro:  alert, non focal  Skin: Warm, no lesions or rashes    Lung cancer Following with Dr Katheran Awe  COPD (chronic obstructive pulmonary disease) ([place]) Improved post exacerbation and PNA.   Please continue your Anoro once a day  Take albuterol 2 puffs up to every 4 hours if needed for shortness of breath.  Use your oxygen at 2-3L/min Follow with Dr Lamonte Sakai in 6 months or sooner if you have any problems   OSA on CPAP Use your CPAP every night with oxygen bled in.    [name], MD, PhD 04/09/2016, 4:03 PM [facility] [phone] or if no answer [phone]

## 2016-04-09 NOTE — Assessment & Plan Note (Signed)
Following with Dr Katheran Awe

## 2016-05-01 ENCOUNTER — Other Ambulatory Visit: Payer: Self-pay

## 2016-05-01 MED ORDER — AZITHROMYCIN 250 MG PO TABS: ORAL_TABLET | ORAL | 0 refills | Status: DC

## 2016-05-22 ENCOUNTER — Ambulatory Visit (HOSPITAL_BASED_OUTPATIENT_CLINIC_OR_DEPARTMENT_OTHER): Payer: Medicare Other

## 2016-05-22 VITALS — BP 115/64 | HR 67 | Temp 97.7°F | Resp 20

## 2016-05-22 DIAGNOSIS — Z85118 Personal history of other malignant neoplasm of bronchus and lung: Secondary | ICD-10-CM

## 2016-05-22 DIAGNOSIS — D500 Iron deficiency anemia secondary to blood loss (chronic): Secondary | ICD-10-CM

## 2016-05-22 DIAGNOSIS — Z452 Encounter for adjustment and management of vascular access device: Secondary | ICD-10-CM

## 2016-05-22 MED ORDER — HEPARIN SOD (PORK) LOCK FLUSH 100 UNIT/ML IV SOLN
250.0000 [IU] | Freq: Once | INTRAVENOUS | Status: AC | PRN
  Administered 2016-05-22: 500 [IU]
  Filled 2016-05-22: qty 5

## 2016-05-22 MED ORDER — SODIUM CHLORIDE 0.9% FLUSH
10.0000 mL | INTRAVENOUS | Status: DC | PRN
  Administered 2016-05-22: 10 mL
  Filled 2016-05-22: qty 10

## 2016-05-22 NOTE — Patient Instructions (Signed)

## 2016-07-04 ENCOUNTER — Ambulatory Visit (INDEPENDENT_AMBULATORY_CARE_PROVIDER_SITE_OTHER)
Admission: RE | Admit: 2016-07-04 | Discharge: 2016-07-04 | Disposition: A | Discharge status: Home / Self Care | Payer: Medicare Other | Source: Ambulatory Visit | Attending: Internal Medicine | Admitting: Internal Medicine

## 2016-07-04 ENCOUNTER — Other Ambulatory Visit (INDEPENDENT_AMBULATORY_CARE_PROVIDER_SITE_OTHER): Payer: Medicare Other

## 2016-07-04 ENCOUNTER — Ambulatory Visit (INDEPENDENT_AMBULATORY_CARE_PROVIDER_SITE_OTHER): Payer: Medicare Other | Admitting: Internal Medicine

## 2016-07-04 ENCOUNTER — Encounter: Payer: Self-pay | Admitting: Internal Medicine

## 2016-07-04 VITALS — BP 128/68 | HR 79 | Temp 97.7°F | Ht 70.0 in | Wt 219.8 lb

## 2016-07-04 DIAGNOSIS — J441 Chronic obstructive pulmonary disease with (acute) exacerbation: Secondary | ICD-10-CM | POA: Diagnosis not present

## 2016-07-04 DIAGNOSIS — I480 Paroxysmal atrial fibrillation: Secondary | ICD-10-CM

## 2016-07-04 LAB — BASIC METABOLIC PANEL
BUN: 26 mg/dL — ABNORMAL HIGH (ref 6–23)
CALCIUM: 9.4 mg/dL (ref 8.4–10.5)
CO2: 31 mEq/L (ref 19–32)
Chloride: 96 mEq/L (ref 96–112)
Creatinine, Ser: 1.39 mg/dL (ref 0.40–1.50)
GFR: 51.03 mL/min — ABNORMAL LOW (ref >60.00)
GLUCOSE: 120 mg/dL — AB (ref 70–99)
POTASSIUM: 4.4 meq/L (ref 3.5–5.1)
Sodium: 135 mEq/L (ref 135–145)

## 2016-07-04 LAB — CBC WITH DIFFERENTIAL/PLATELET
BASOS PCT: 0.3 % (ref 0.0–3.0)
Basophils Absolute: 0 10*3/uL (ref 0.0–0.1)
EOS ABS: 0.1 10*3/uL (ref 0.0–0.7)
EOS PCT: 0.7 % (ref 0.0–5.0)
HCT: 42.6 % (ref 39.0–52.0)
HEMOGLOBIN: 14.4 g/dL (ref 13.0–17.0)
LYMPHS ABS: 3 10*3/uL (ref 0.7–4.0)
Lymphocytes Relative: 21.6 % (ref 12.0–46.0)
MCHC: 33.8 g/dL (ref 30.0–36.0)
MCV: 84.8 fl (ref 78.0–100.0)
MONO ABS: 1.2 10*3/uL — AB (ref 0.1–1.0)
Monocytes Relative: 8.3 % (ref 3.0–12.0)
NEUTROS PCT: 69.1 % (ref 43.0–77.0)
Neutro Abs: 9.6 10*3/uL — ABNORMAL HIGH (ref 1.4–7.7)
PLATELETS: 190 10*3/uL (ref 150.0–400.0)
RBC: 5.02 Mil/uL (ref 4.22–5.81)
RDW: 17.2 % — AB (ref 11.5–15.5)
WBC: 13.9 10*3/uL — ABNORMAL HIGH (ref 4.0–10.5)

## 2016-07-04 LAB — BRAIN NATRIURETIC PEPTIDE: PRO B NATRI PEPTIDE: 98 pg/mL (ref 0.0–100.0)

## 2016-07-04 MED ORDER — PREDNISONE 20 MG PO TABS: ORAL_TABLET | ORAL | 0 refills | Status: DC

## 2016-07-04 MED ORDER — LEVALBUTEROL HCL 0.63 MG/3ML IN NEBU
0.6300 mg | INHALATION_SOLUTION | Freq: Once | RESPIRATORY_TRACT | Status: AC
  Administered 2016-07-04: 0.63 mg via RESPIRATORY_TRACT

## 2016-07-04 MED ORDER — DOXYCYCLINE HYCLATE 100 MG PO TABS: 100.0000 mg | ORAL_TABLET | Freq: Two times a day (BID) | ORAL | 0 refills | Status: DC

## 2016-07-04 NOTE — Assessment & Plan Note (Addendum)
Pulse at this exam is regular and he does not seem to be in overt heart failure sufficient to cause right-sided signs. He says heart rhythm goes up and down and usually he can feel it when its fast.

## 2016-07-04 NOTE — Progress Notes (Signed)
ROV 04/09/16 Dr Lamonte Sakai -- this is a follow-up visit for history of COPD, obstructive sleep apnea on CPAP, non-small cell lung cancer treated with chemoradiation. I saw him about a month ago with an acute exacerbation in the setting of community-acquired versus aspiration pneumonia. He was treated with antibiotics, steroids. He is improved, probably back to baseline. O2 at 2-3L/min. He is on Anoro, is unclear whether it has helped him much. He uses albuterol about 2-3x a day. He is on zyrtec. Flu shot up to date. He has very good compliance with his CPAP  07/04/2016- 83 year old male former smoker followed by Dr. Lamonte Sakai for COPD, OSA/CPAP, NSCCA O2 2 L continuous/[employer] Gets iron infusions for anemia. Followed by oncology for non-small cell lung CA/chemotherapy radiation. Acute Visit-ACUTE VISIT: RB patient; Increased fluid/swelling since yesterday, SOB and wheezing, Chills as well. Dr Thurman Coyer office told him to come here.  Daughter Bethena Roys is here. He complains of "fluid" in his chest, saying his feet never swell. Marked increased polyuria in the last 24 hours. Cough productive "yellow mass". He says he is always cold with no recognized fever recently. No GI upset. No blood or chest pain. Antibiotic 3 weeks ago from Dr. AVVA/ PCP for sinus infection but he is not sure which antibiotic. CXR 03/07/2016- IMPRESSION: 1. Stable severe COPD/emphysema and asymmetric interstitial fibrosis involving the lower lobes, right greater than left. No acute cardiopulmonary disease. 2. Stable chronic right pleural effusion. 3. Thoracic aortic atherosclerosis.  Prior to Admission medications   Medication Sig Start Date End Date Taking? Authorizing Provider  apixaban (ELIQUIS) 5 MG TABS tablet Take 1 tablet (5 mg total) by mouth 2 (two) times daily. 11/12/12  Yes Jacolyn Reedy, MD  Calcium Carbonate-Vitamin D (CALCIUM 600+D) 600-400 MG-UNIT tablet Take 1 tablet by mouth daily.   Yes Ravisankar Avva, MD  cetirizine  (ZYRTEC) 10 MG tablet Take 10 mg by mouth daily.    Yes Historical Provider, MD  diltiazem (CARDIZEM CD) 120 MG 24 hr capsule Take 120 mg by mouth daily.   Yes Historical Provider, MD  Exenatide ER (BYDUREON) 2 MG PEN Inject 2 mg into the skin once a week. On Sundays   Yes Historical Provider, MD  furosemide (LASIX) 40 MG tablet Take 1 tablet (40 mg total) by mouth 2 (two) times daily. 05/11/15  Yes Thurnell Lose, MD  glucosamine-chondroitin 500-400 MG tablet Take 1 tablet by mouth 2 (two) times daily.    Yes Historical Provider, MD  Insulin Detemir (LEVEMIR FLEXTOUCH) 100 UNIT/ML Pen Inject into the skin 2 (two) times daily as needed (CBG >100). 02/14/2016 If CBG >100 inject 20 units subcutaneously with breakfast and 16 units with supper.   Yes Historical Provider, MD  insulin lispro (HUMALOG) 100 UNIT/ML injection Inject 5 Units into the skin 3 (three) times daily before meals.   Yes Historical Provider, MD  levothyroxine (SYNTHROID, LEVOTHROID) 25 MCG tablet Take 25 mcg by mouth daily before breakfast.   Yes Historical Provider, MD  megestrol (MEGACE) 400 MG/10ML suspension Take 10 mLs (400 mg total) by mouth 2 (two) times daily. 02/14/16  Yes Volanda Napoleon, MD  Multiple Vitamin (MULTIVITAMIN) tablet Take 1 tablet by mouth daily.   Yes Ravisankar Avva, MD  oxybutynin (DITROPAN) 5 MG tablet Take 5 mg by mouth 2 (two) times daily.   Yes Historical Provider, MD  PROAIR HFA 108 (90 Base) MCG/ACT inhaler INHALE TWO PUFFS BY MOUTH EVERY 6 HOURS AS NEEDED FOR WHEEZING AND FOR SHORTNESS OF BREATH  02/14/16  Yes Collene Gobble, MD  temazepam (RESTORIL) 15 MG capsule TAKE ONE CAPSULE BY MOUTH AT BEDTIME AS NEEDED FOR SLEEP 02/02/14  Yes Volanda Napoleon, MD  umeclidinium-vilanterol (ANORO ELLIPTA) 62.5-25 MCG/INH AEPB Inhale 1 puff into the lungs daily. 03/05/16  Yes Tammy S Parrett, NP  doxycycline (VIBRA-TABS) 100 MG tablet Take 1 tablet (100 mg total) by mouth 2 (two) times daily. 07/04/16   Deneise Lever, MD   predniSONE (DELTASONE) 20 MG tablet 1 daily x 3 days 07/04/16   Deneise Lever, MD    ROS-see HPI   Negative unless "+" Constitutional:    weight loss, night sweats, fevers, +chills, fatigue, lassitude. HEENT:    headaches, difficulty swallowing, tooth/dental problems, sore throat,       sneezing, itching, ear ache, +nasal congestion, post nasal drip, snoring CV:    chest pain, orthopnea, PND, swelling in lower extremities, anasarca,                                              dizziness, palpitations Resp:   +shortness of breath with exertion or at rest.                +productive cough,   non-productive cough, coughing up of blood.              +change in color of mucus.  wheezing.   Skin:    rash or lesions. GI:  No-   heartburn, indigestion, abdominal pain, nausea, vomiting, diarrhea,                 change in bowel habits, loss of appetite GU: dysuria, change in color of urine, no urgency or frequency.   flank pain. MS:   joint pain, stiffness, decreased range of motion, back pain. Neuro-     nothing unusual Psych:  change in mood or affect.  depression or anxiety.   memory loss.  OBJ- Physical Exam   O2 2L General- Alert, Oriented, Affect-appropriate, Distress- Not toxic Skin- rash-none, lesions- none, excoriation- none Lymphadenopathy- none Head- atraumatic            Eyes- Gross vision intact, PERRLA, conjunctivae and secretions clear            Ears- Hearing, canals-normal            Nose- Clear, no-Septal dev, mucus, polyps, erosion, perforation             Throat- Mallampati II , mucosa clear , drainage- none, tonsils- atrophic Neck- flexible , trachea midline, no stridor , thyroid nl, carotid no bruit Chest - symmetrical excursion , unlabored           Heart/CV- RRR , no murmur , no gallop  , no rub, nl s1 s2                           - JVD- none , edema- none at ankles, stasis changes- none, varices- none           Lung- + coarse diffuse rhonchi, wheeze- none, cough+  light , dullness-none, rub- none           Chest wall-  Abd-  Br/ Gen/ Rectal- Not done, not indicated Extrem- cyanosis- none, clubbing, none, atrophy- none, strength- nl Neuro- grossly intact to observation

## 2016-07-04 NOTE — Assessment & Plan Note (Signed)
"  Yellow" sputum suggest acute infection although he considers his problem of "fluid" and describes recent polyuria. He does not have neck vein distention or peripheral edema. Cardiology saw him today and felt he needed to see [place] for this exacerbation. Plan-doxycycline, limited prednisone course to minimize hyperglycemia, nebulizer treatments Xopenex, CXR, lab for CBC with differential, BNP. Call us next week if no better. Keep follow-up appointment with Dr. Lamonte Sakai and other physicians as planned.

## 2016-07-04 NOTE — Patient Instructions (Addendum)
Script sent for doxycycline antibiotic and for prednisone  Order- lab- CBC w diff, BMET, BNP     Dx acute exacerbation COPD, Paroxysmal Afib             CXR     Neb xop 0.63  Please call next week or as needed and plan to keep appointment with Dr Lamonte Sakai in April

## 2016-07-05 ENCOUNTER — Telehealth: Payer: Self-pay | Admitting: Internal Medicine

## 2016-07-05 DIAGNOSIS — R911 Solitary pulmonary nodule: Secondary | ICD-10-CM

## 2016-07-05 NOTE — Telephone Encounter (Signed)
Spoke with Bethena Roys, his daughter, and explained results. She verbalized understanding and I placed the order for the CT scan without contrast. Nothing further needed.

## 2016-07-05 NOTE — Telephone Encounter (Signed)
Pt is aware of results and order placed.  

## 2016-07-10 ENCOUNTER — Ambulatory Visit (HOSPITAL_BASED_OUTPATIENT_CLINIC_OR_DEPARTMENT_OTHER)
Admission: RE | Admit: 2016-07-10 | Discharge: 2016-07-10 | Disposition: A | Discharge status: Home / Self Care | Payer: Medicare Other | Source: Ambulatory Visit | Attending: Internal Medicine | Admitting: Internal Medicine

## 2016-07-10 DIAGNOSIS — I700 Atherosclerosis of aorta: Secondary | ICD-10-CM | POA: Diagnosis not present

## 2016-07-10 DIAGNOSIS — I2510 Atherosclerotic heart disease of native coronary artery without angina pectoris: Secondary | ICD-10-CM | POA: Insufficient documentation

## 2016-07-10 DIAGNOSIS — R911 Solitary pulmonary nodule: Secondary | ICD-10-CM | POA: Insufficient documentation

## 2016-07-10 DIAGNOSIS — Z87891 Personal history of nicotine dependence: Secondary | ICD-10-CM | POA: Insufficient documentation

## 2016-07-10 DIAGNOSIS — J90 Pleural effusion, not elsewhere classified: Secondary | ICD-10-CM | POA: Diagnosis not present

## 2016-07-10 DIAGNOSIS — J439 Emphysema, unspecified: Secondary | ICD-10-CM | POA: Insufficient documentation

## 2016-07-12 ENCOUNTER — Telehealth: Payer: Self-pay | Admitting: Emergency Medicine

## 2016-07-12 NOTE — Telephone Encounter (Signed)
Notes Recorded by Deneise Lever, MD on 07/11/2016 at 8:48 AM EST CT chest- small to moderate fluid collection "pleural effusion" in the right lung is not new, but might be slightly bigger. There is emphysema with some scarring, and atherosclerosis as before. ------------------------------ Spoke with pt's daughter, Butch Penny. She is aware of results. Nothing further was needed.

## 2016-07-17 ENCOUNTER — Ambulatory Visit (HOSPITAL_BASED_OUTPATIENT_CLINIC_OR_DEPARTMENT_OTHER): Payer: Medicare Other

## 2016-07-17 VITALS — BP 121/69 | HR 68 | Resp 18

## 2016-07-17 DIAGNOSIS — Z85118 Personal history of other malignant neoplasm of bronchus and lung: Secondary | ICD-10-CM | POA: Diagnosis not present

## 2016-07-17 DIAGNOSIS — Z452 Encounter for adjustment and management of vascular access device: Secondary | ICD-10-CM

## 2016-07-17 DIAGNOSIS — D500 Iron deficiency anemia secondary to blood loss (chronic): Secondary | ICD-10-CM

## 2016-07-17 MED ORDER — HEPARIN SOD (PORK) LOCK FLUSH 100 UNIT/ML IV SOLN
500.0000 [IU] | Freq: Once | INTRAVENOUS | Status: AC | PRN
  Administered 2016-07-17: 500 [IU]
  Filled 2016-07-17: qty 5

## 2016-07-17 MED ORDER — SODIUM CHLORIDE 0.9% FLUSH
10.0000 mL | INTRAVENOUS | Status: DC | PRN
  Administered 2016-07-17: 10 mL
  Filled 2016-07-17: qty 10

## 2016-09-25 ENCOUNTER — Ambulatory Visit: Payer: Medicare Other

## 2016-09-25 ENCOUNTER — Other Ambulatory Visit (HOSPITAL_BASED_OUTPATIENT_CLINIC_OR_DEPARTMENT_OTHER): Payer: Medicare Other

## 2016-09-25 ENCOUNTER — Ambulatory Visit (HOSPITAL_BASED_OUTPATIENT_CLINIC_OR_DEPARTMENT_OTHER): Payer: Medicare Other | Admitting: Hematology & Oncology

## 2016-09-25 VITALS — Wt 220.0 lb

## 2016-09-25 DIAGNOSIS — C3411 Malignant neoplasm of upper lobe, right bronchus or lung: Secondary | ICD-10-CM

## 2016-09-25 DIAGNOSIS — D509 Iron deficiency anemia, unspecified: Secondary | ICD-10-CM

## 2016-09-25 DIAGNOSIS — Z85118 Personal history of other malignant neoplasm of bronchus and lung: Secondary | ICD-10-CM | POA: Diagnosis not present

## 2016-09-25 DIAGNOSIS — C3401 Malignant neoplasm of right main bronchus: Secondary | ICD-10-CM | POA: Insufficient documentation

## 2016-09-25 LAB — CMP (CANCER CENTER ONLY)
ALBUMIN: 3.1 g/dL — AB (ref 3.3–5.5)
ALK PHOS: 88 U/L — AB (ref 26–84)
ALT: 22 U/L (ref 10–47)
AST: 27 U/L (ref 11–38)
BILIRUBIN TOTAL: 0.6 mg/dL (ref 0.20–1.60)
BUN, Bld: 21 mg/dL (ref 7–22)
CALCIUM: 9.3 mg/dL (ref 8.0–10.3)
CO2: 29 mEq/L (ref 18–33)
CREATININE: 1.4 mg/dL — AB (ref 0.6–1.2)
Chloride: 101 mEq/L (ref 98–108)
Glucose, Bld: 150 mg/dL — ABNORMAL HIGH (ref 73–118)
Potassium: 4.1 mEq/L (ref 3.3–4.7)
SODIUM: 139 meq/L (ref 128–145)
TOTAL PROTEIN: 6.9 g/dL (ref 6.4–8.1)

## 2016-09-25 LAB — CBC WITH DIFFERENTIAL (CANCER CENTER ONLY)
BASO#: 0 10*3/uL (ref 0.0–0.2)
BASO%: 0.4 % (ref 0.0–2.0)
EOS ABS: 0.1 10*3/uL (ref 0.0–0.5)
EOS%: 1.8 % (ref 0.0–7.0)
HCT: 40.8 % (ref 38.7–49.9)
HGB: 13.2 g/dL (ref 13.0–17.1)
LYMPH#: 1.7 10*3/uL (ref 0.9–3.3)
LYMPH%: 23 % (ref 14.0–48.0)
MCH: 28 pg (ref 28.0–33.4)
MCHC: 32.4 g/dL (ref 32.0–35.9)
MCV: 87 fL (ref 82–98)
MONO#: 0.7 10*3/uL (ref 0.1–0.9)
MONO%: 9.1 % (ref 0.0–13.0)
NEUT%: 65.7 % (ref 40.0–80.0)
NEUTROS ABS: 4.9 10*3/uL (ref 1.5–6.5)
Platelets: 238 10*3/uL (ref 145–400)
RBC: 4.71 10*6/uL (ref 4.20–5.70)
RDW: 15 % (ref 11.1–15.7)
WBC: 7.4 10*3/uL (ref 4.0–10.0)

## 2016-09-25 LAB — IRON AND TIBC
%SAT: 15 % — ABNORMAL LOW (ref 20–55)
Iron: 59 ug/dL (ref 42–163)
TIBC: 399 ug/dL (ref 202–409)
UIBC: 340 ug/dL (ref 117–376)

## 2016-09-25 LAB — LACTATE DEHYDROGENASE: LDH: 202 U/L (ref 125–245)

## 2016-09-25 LAB — FERRITIN: Ferritin: 53 ng/ml (ref 22–316)

## 2016-09-25 NOTE — Progress Notes (Signed)
Hematology and Oncology Follow Up Visit  [name] [id number] 10-Jan-1925 83 y.o. 09/25/2016   Principle Diagnosis:  . 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 2. Paroxysmal atrial fibrillation. 3. 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 4. Iron deficiency anemia due to malabsorption  Current Therapy:   Eliquis 5 mg p.o. B.i.d. IV Feraheme as indicated     Interim History:  [name] is back for followup. He looks much better. The iron that we gave him really helped. Moist saw him back in October, his ferritin was only 44, which is very low for him. His iron saturation was only 12%.  He had his 89th birthday 2 months ago. He had a very good celebration.  He does wear oxygen. He feels his lungs might be getting a little bit worse.  He is not had problems with his blood sugars. They have been a little on the higher side.  He has not been hospitalized since we last saw him.   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  Overall, his performance status is ECOG 2.  Medications:  Current Outpatient Prescriptions:  .  apixaban (ELIQUIS) 5 MG TABS tablet, Take 1 tablet (5 mg total) by mouth 2 (two) times daily., Disp: 60 tablet, Rfl: 12 .  Calcium Carbonate-Vitamin D (CALCIUM 600+D) 600-400 MG-UNIT tablet, Take 1 tablet by mouth daily., Disp: , Rfl:  .  cetirizine (ZYRTEC) 10 MG tablet, Take 10 mg by mouth daily. , Disp: , Rfl:  .  diltiazem (CARDIZEM CD) 120 MG 24 hr capsule, Take 120 mg by mouth daily., Disp: , Rfl:  .  doxycycline (VIBRA-TABS) 100 MG tablet, Take 1 tablet (100 mg total) by mouth 2 (two) times daily., Disp: 14 tablet, Rfl: 0 .  Exenatide ER (BYDUREON) 2 MG PEN, Inject 2 mg into the skin once a week. On Sundays, Disp: , Rfl:  .  furosemide (LASIX) 40 MG tablet, Take 1 tablet (40 mg total) by mouth 2 (two) times daily., Disp: 30 tablet, Rfl: 12 .   glucosamine-chondroitin 500-400 MG tablet, Take 1 tablet by mouth 2 (two) times daily. , Disp: , Rfl:  .  Insulin Detemir (LEVEMIR FLEXTOUCH) 100 UNIT/ML Pen, Inject into the skin 2 (two) times daily as needed (CBG >100). 02/14/2016 If CBG >100 inject 20 units subcutaneously with breakfast and 16 units with supper., Disp: , Rfl:  .  insulin lispro (HUMALOG) 100 UNIT/ML injection, Inject 5 Units into the skin 3 (three) times daily before meals., Disp: , Rfl:  .  levothyroxine (SYNTHROID, LEVOTHROID) 25 MCG tablet, Take 25 mcg by mouth daily before breakfast., Disp: , Rfl:  .  megestrol (MEGACE) 400 MG/10ML suspension, Take 10 mLs (400 mg total) by mouth 2 (two) times daily., Disp: 240 mL, Rfl: 0 .  metFORMIN (GLUCOPHAGE-XR) 500 MG 24 hr tablet, Take 500 mg by mouth., Disp: , Rfl:  .  Multiple Vitamin (MULTIVITAMIN) tablet, Take 1 tablet by mouth daily., Disp: , Rfl:  .  oxybutynin (DITROPAN) 5 MG tablet, Take 5 mg by mouth 2 (two) times daily., Disp: , Rfl:  .  predniSONE (DELTASONE) 20 MG tablet, 1 daily x 3 days, Disp: 3 tablet, Rfl: 0 .  PROAIR HFA 108 (90 Base) MCG/ACT inhaler, INHALE TWO PUFFS BY MOUTH EVERY 6 HOURS AS NEEDED FOR WHEEZING AND FOR SHORTNESS OF BREATH, Disp: 9  each, Rfl: 5 .  temazepam (RESTORIL) 15 MG capsule, TAKE ONE CAPSULE BY MOUTH AT BEDTIME AS NEEDED FOR SLEEP, Disp: 30 capsule, Rfl: 0 .  umeclidinium-vilanterol (ANORO ELLIPTA) 62.5-25 MCG/INH AEPB, Inhale 1 puff into the lungs daily., Disp: 3 each, Rfl: 3  Allergies: No Known Allergies  Past Medical History, Surgical history, Social history, and Family History were reviewed and updated.  Review of Systems: As above  Physical Exam:  weight is 220 lb (99.8 kg). His oxygen saturation is 93%.   Very [name] gentleman. Head and neck exam shows no ocular or oral lesions. He has no adenopathy in the neck. Lungs are clear. Cardiac exam regular rate and rhythm this is with atrial fibrillation. He has a 1/6 murmur. Abdomen soft.  Has good bowel sounds. There is no palpable abdominal mass. There is no fluid wave. Back exam no tenderness over the spine ribs or hips. Extremities shows some trace chronic edema in his legs. Has good strength. Has good range of motion of his joints. Skin exam shows some actinic keratoses. Some ecchymoses are noted. No petechia. Neurological exam is negative. Lab Results  Component Value Date   WBC 7.4 09/25/2016   HGB 13.2 09/25/2016   HCT 40.8 09/25/2016   MCV 87 09/25/2016   PLT 238 09/25/2016     Chemistry      Component Value Date/Time   NA 139 09/25/2016 0941   NA 138 03/27/2016 1025   K 4.1 09/25/2016 0941   K 4.2 03/27/2016 1025   CL 101 09/25/2016 0941   CO2 29 09/25/2016 0941   CO2 24 03/27/2016 1025   BUN 21 09/25/2016 0941   BUN 19.1 03/27/2016 1025   CREATININE 1.4 (H) 09/25/2016 0941   CREATININE 1.3 03/27/2016 1025      Component Value Date/Time   CALCIUM 9.3 09/25/2016 0941   CALCIUM 8.7 03/27/2016 1025   ALKPHOS 88 (H) 09/25/2016 0941   ALKPHOS 85 03/27/2016 1025   AST 27 09/25/2016 0941   AST 28 03/27/2016 1025   ALT 22 09/25/2016 0941   ALT 17 03/27/2016 1025   BILITOT 0.60 09/25/2016 0941   BILITOT 0.42 03/27/2016 1025         Impression and Plan: [name] is 83 year old gentleman with a history of locally advanced-stage IIIB-adenocarcinoma of the right lung. He had contralateral mediastinal node involvement. He was treated with radiation and chemotherapy. He had a very nice response. He completed  treatment in December of 2012. Thankfully, His disease has not come back. This I am surprised by because of the extensive nature of his locally advanced disease.  I am so happy That he had his 89th birthday. He really looks good.  9 really helped him. If his iron is borderline low at this time, we will give him a another iron dose.  I will see him back in 6 months. I will get a chest x-ray we see him back.  Clearly, at this point, his prognosis is  dependent upon his cardiac status.    Volanda Napoleon, MD 4/18/201811:10 AM

## 2016-09-26 LAB — RETICULOCYTES: Reticulocyte Count: 1.5 % (ref 0.6–2.6)

## 2016-09-27 ENCOUNTER — Telehealth: Payer: Self-pay | Admitting: *Deleted

## 2016-09-27 NOTE — Telephone Encounter (Addendum)
Patient is aware of results. Appointment made  ----- Message from Volanda Napoleon, MD sent at 09/25/2016  3:39 PM EDT ----- Call - iron level is better but low!!!  Need 1 more dose of Feraheme!!  Please set up in 1-2 week!!  pete

## 2016-10-01 ENCOUNTER — Ambulatory Visit (HOSPITAL_BASED_OUTPATIENT_CLINIC_OR_DEPARTMENT_OTHER): Payer: Medicare Other

## 2016-10-01 VITALS — BP 109/83 | HR 67 | Resp 20

## 2016-10-01 DIAGNOSIS — D508 Other iron deficiency anemias: Secondary | ICD-10-CM

## 2016-10-01 DIAGNOSIS — K909 Intestinal malabsorption, unspecified: Secondary | ICD-10-CM

## 2016-10-01 DIAGNOSIS — D500 Iron deficiency anemia secondary to blood loss (chronic): Secondary | ICD-10-CM

## 2016-10-01 MED ORDER — SODIUM CHLORIDE 0.9 % IV SOLN
510.0000 mg | Freq: Once | INTRAVENOUS | Status: AC
  Administered 2016-10-01: 510 mg via INTRAVENOUS
  Filled 2016-10-01: qty 17

## 2016-10-01 MED ORDER — SODIUM CHLORIDE 0.9 % IV SOLN
Freq: Once | INTRAVENOUS | Status: AC
  Administered 2016-10-01: 11:00:00 via INTRAVENOUS

## 2016-10-01 MED ORDER — SODIUM CHLORIDE 0.9% FLUSH
3.0000 mL | Freq: Once | INTRAVENOUS | Status: DC | PRN
  Filled 2016-10-01: qty 10

## 2016-10-01 MED ORDER — HEPARIN SOD (PORK) LOCK FLUSH 100 UNIT/ML IV SOLN
500.0000 [IU] | Freq: Once | INTRAVENOUS | Status: AC | PRN
  Administered 2016-10-01: 500 [IU]
  Filled 2016-10-01: qty 5

## 2016-10-01 NOTE — Patient Instructions (Signed)

## 2016-10-07 ENCOUNTER — Ambulatory Visit (INDEPENDENT_AMBULATORY_CARE_PROVIDER_SITE_OTHER)
Admission: RE | Admit: 2016-10-07 | Discharge: 2016-10-07 | Disposition: A | Discharge status: Home / Self Care | Payer: Medicare Other | Source: Ambulatory Visit | Attending: Emergency Medicine | Admitting: Emergency Medicine

## 2016-10-07 ENCOUNTER — Encounter: Payer: Self-pay | Admitting: Emergency Medicine

## 2016-10-07 ENCOUNTER — Ambulatory Visit (INDEPENDENT_AMBULATORY_CARE_PROVIDER_SITE_OTHER): Payer: Medicare Other | Admitting: Emergency Medicine

## 2016-10-07 DIAGNOSIS — C3401 Malignant neoplasm of right main bronchus: Secondary | ICD-10-CM | POA: Diagnosis not present

## 2016-10-07 DIAGNOSIS — J439 Emphysema, unspecified: Secondary | ICD-10-CM | POA: Diagnosis not present

## 2016-10-07 NOTE — Assessment & Plan Note (Signed)
Following w Dr Katheran Awe. CXR today

## 2016-10-07 NOTE — Assessment & Plan Note (Addendum)
He is concerned that the Anoro is making his breathing worse?? Not clear how this would be true unless he is having side effects - A fib, ? UA irritation. He would like to temporarily stop, see if he improves. Will continue O2 as ordered. Check CXR to assess for any new effusion, etc to explain dyspnea. ? Fe-deficiency

## 2016-10-07 NOTE — Patient Instructions (Addendum)
We will continue your oxygen at all times.  Stay on CPAP every night CXR today.  Take your lasix as needed for increase in your swelling as directed.  We will temporarily stop Anoro for 2-3 weeks to see if it changes your breathing. Call our office to let us know in 3 weeks because we may decide to restart it.  Follow with Dr Lamonte Sakai in 3 months or sooner if you have any problems.

## 2016-10-07 NOTE — Progress Notes (Signed)
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 Was admitted 5/31 with acute dyspnea, found to be in A fib + RVR. Started on amiodarone. Converted to NSR 11/11/12. Scheduled for repeat Ct scan chest (Dr Katheran Awe) for surveillance on 11/25/12. During his hospitalization, we started the eval for his presumed COPD.  He has been on Spiriva before, SABA before, didn't notice any benefit.   ROV 08/14/15 -- follow-up visit for history of COPD, OSA on CPAP. He also has a history of adenocarcinoma of the lung followed by Dr Katheran Awe, treated with chemoradiation. He has not any evidence clinically of recurrence.  He was admitted end of 11/16 with CAP and Ae-COPD. No CT chest in several years, CXR without evidence recurrence in December. He is not on scheduled BD, didn't respond to Spiriva.   ROV 03/07/16 -- patient has a history of COPD, obstructive sleep apnea and non-small cell lung cancer (adenocarcinoma) that was treated with chemoradiation. Most recent CT scan of the chest was reassuring for no recurrence of disease in July 2017. He was seen here earlier this month with significant wheezing, progressive dyspnea. He had not been on scheduled bronchodilators and these were retried as he started Anoro. He continues to have severe exertional SOB. He is not clear that the Anoro has helped him any. He continues to have wheeze. He has had some chills over the last 2 days.   ROV 04/09/16 -- this is a follow-up visit for history of COPD, obstructive sleep apnea on CPAP, non-small cell lung cancer treated with chemoradiation. I saw him about a month ago with an acute exacerbation in the setting of community-acquired versus aspiration pneumonia. He was treated with antibiotics, steroids. He is improved, probably back to baseline. O2 at 2-3L/min. He is on Anoro, is unclear whether it has helped him much. He uses albuterol about 2-3x a  day. He is on zyrtec. Flu shot up to date. He has very good compliance with his CPAP.   ROV 10/07/16 -- patient follows up today for history of COPD, objective sleep apnea on CPAP, non-small cell lung cancer of the right middle lobe treated with chemotherapy and radiation. Also with a remote history of DVT, hypertension with diastolic CHF. He has been managed on Anoro - he is very reliable w his CPAP, compliance report shows 100% usage > 4h. Good clinical benefit. He is concerned that the Anoro is not helping him. He states that his exercise tolerance has decreased compared with last visit. Notes it w walking. No CP, tightness. No wt change. He uses lasix prn for edema, has been using a bit more lately. His iron level has been low - just received Fe infusion 1 weeks ago. He states tha this A fib has been very labile - tachy / brady/    Vitals:   10/07/16 1350  BP: 98/60  Pulse: (!) 116  SpO2: 92%  Weight: 217 lb (98.4 kg)  Height: '5\' 10"'$  (1.778 m)   Gen: Pleasant, elderly man, in no distress,  normal affect  ENT: No lesions,  mouth clear,  oropharynx clear, no postnasal drip  Neck: No JVD, no TMG, no carotid bruits  Lungs: No use of accessory muscles, bilateral coarse BS, R basilar crackles.   Cardiovascular: RRR, heart sounds normal, no murmur or gallops, no peripheral edema  Musculoskeletal: No deformities, no cyanosis or clubbing  Neuro: alert, non focal  Skin: Warm, no lesions or rashes    COPD (chronic obstructive pulmonary disease) (HCC) He is concerned that the Anoro is making his breathing worse?? Not clear how this would be true unless he is having side effects - A fib, ? UA irritation. He would like to temporarily stop, see if he improves. Will continue O2 as ordered. Check CXR to assess for any new effusion, etc to explain dyspnea. ? Fe-deficiency  Lung cancer Following w Dr Katheran Awe. CXR today  OSA on CPAP Good compliance w CPAP 9. Continue same.    [name],  MD, PhD 10/07/2016, 2:09 PM [facility] [phone] or if no answer [phone]

## 2016-10-07 NOTE — Assessment & Plan Note (Signed)
Good compliance w CPAP 9. Continue same.

## 2016-10-09 ENCOUNTER — Telehealth: Payer: Self-pay | Admitting: Emergency Medicine

## 2016-10-09 NOTE — Telephone Encounter (Signed)
RB  Please Advise-  Please advise on pt's chest xray results

## 2016-10-10 NOTE — Telephone Encounter (Signed)
Please let him know that his CXR is slightly improved compared w prior - no evidence PNA, fluid, or recurrence of cancer. This is good news.

## 2016-10-10 NOTE — Telephone Encounter (Signed)
Pt advised CXR results. Pt verbalized understanding and nothing further is needed.

## 2016-11-01 ENCOUNTER — Telehealth: Payer: Self-pay | Admitting: Emergency Medicine

## 2016-11-01 MED ORDER — UMECLIDINIUM-VILANTEROL 62.5-25 MCG/INH IN AEPB
1.0000 | INHALATION_SPRAY | Freq: Every day | RESPIRATORY_TRACT | 6 refills | Status: DC
Start: 2016-11-01 — End: 2016-12-09

## 2016-11-01 MED ORDER — UMECLIDINIUM-VILANTEROL 62.5-25 MCG/INH IN AEPB: 1.0000 | INHALATION_SPRAY | Freq: Every day | RESPIRATORY_TRACT | 6 refills | Status: DC

## 2016-11-01 NOTE — Addendum Note (Signed)
Addended by: Maryanna Shape A on: 11/01/2016 12:01 PM   Modules accepted: Orders

## 2016-11-01 NOTE — Telephone Encounter (Signed)
Rx has been sent to preferred pharmacy. Nothing further needed.

## 2016-11-01 NOTE — Telephone Encounter (Signed)
Then should stay on it until next ov, be sure has enough refills

## 2016-11-01 NOTE — Telephone Encounter (Signed)
Spoke with pt's daughter, Butch Penny, who states during 10/07/16 OV with RB pt was instructed to stop anoro for 2-3 weeks to see if it changes your breathing. Butch Penny states pt has restarted Anoro today, due to increased sob with exertion. Per AVS pt was instructed to call in 3 weeks with an update.   Will route to DOD and RB, as Dr. Lamonte Sakai is on vacation.   10/07/16 We will continue your oxygen at all times.  Stay on CPAP every night CXR today.  Take your lasix as needed for increase in your swelling as directed.  We will temporarily stop Anoro for 2-3 weeks to see if it changes your breathing. Call our office to let us know in 3 weeks because we may decide to restart it.  Follow with Dr Lamonte Sakai in 3 months or sooner if you have any problems.

## 2016-11-18 ENCOUNTER — Telehealth: Payer: Self-pay | Admitting: Emergency Medicine

## 2016-11-18 MED ORDER — UMECLIDINIUM-VILANTEROL 62.5-25 MCG/INH IN AEPB: 1.0000 | INHALATION_SPRAY | Freq: Every day | RESPIRATORY_TRACT | 0 refills | Status: AC

## 2016-11-18 NOTE — Telephone Encounter (Signed)
Patient's daughter, Butch Penny, returning call.  CB is [phone]

## 2016-11-18 NOTE — Telephone Encounter (Signed)
lmom tcb x1 to [employer]

## 2016-11-18 NOTE — Telephone Encounter (Signed)
Samples have been left at the front desk. Pt's daughter is aware. Nothing further was needed.

## 2016-11-20 ENCOUNTER — Ambulatory Visit (HOSPITAL_BASED_OUTPATIENT_CLINIC_OR_DEPARTMENT_OTHER): Payer: Medicare Other

## 2016-11-20 VITALS — BP 118/67 | HR 43 | Temp 97.6°F | Resp 20

## 2016-11-20 DIAGNOSIS — Z85118 Personal history of other malignant neoplasm of bronchus and lung: Secondary | ICD-10-CM

## 2016-11-20 DIAGNOSIS — Z452 Encounter for adjustment and management of vascular access device: Secondary | ICD-10-CM | POA: Diagnosis not present

## 2016-11-20 MED ORDER — SODIUM CHLORIDE 0.9% FLUSH
10.0000 mL | INTRAVENOUS | Status: AC | PRN
  Administered 2016-11-20: 10 mL via INTRAVENOUS
  Filled 2016-11-20: qty 10

## 2016-11-20 MED ORDER — HEPARIN SOD (PORK) LOCK FLUSH 100 UNIT/ML IV SOLN
500.0000 [IU] | Freq: Once | INTRAVENOUS | Status: AC
  Administered 2016-11-20: 500 [IU] via INTRAVENOUS
  Filled 2016-11-20: qty 5

## 2016-11-20 NOTE — Addendum Note (Signed)
Addended by: Randolm Idol on: 11/20/2016 11:53 AM   Modules accepted: Orders, SmartSet

## 2016-12-05 ENCOUNTER — Telehealth: Payer: Self-pay | Admitting: Emergency Medicine

## 2016-12-05 NOTE — Telephone Encounter (Signed)
Error

## 2016-12-06 ENCOUNTER — Telehealth: Payer: Self-pay | Admitting: Emergency Medicine

## 2016-12-06 NOTE — Telephone Encounter (Signed)
[name], patient's daughter is in town and would like to pick up patient assistance forms and RX today if possible.  CB is [phone].

## 2016-12-06 NOTE — Telephone Encounter (Signed)
Will close encounter, as another encounter has been open in regards to patient assistance. Nothing further needed.

## 2016-12-06 NOTE — Telephone Encounter (Signed)
Patient daughter Vertell Novak called and she would like rx for Anoro printed for her to pick up today to go along with her patient assistance forms. She can be reached at [phone] -pr

## 2016-12-06 NOTE — Telephone Encounter (Signed)
Butch Penny (pt's daughter) has dropped pt's income information off to be faxed to [place].  Will route to [place] to f/u on.

## 2016-12-06 NOTE — Telephone Encounter (Signed)
Will forward to lindsay to make her aware of pt assistance forms have been dropped off and are in RB folder up front.

## 2016-12-06 NOTE — Telephone Encounter (Signed)
Spoke with pt's daughter Butch Penny (pt gave verbal to speak to speak with Butch Penny). Pt is currently applying for patient assistance for Anoro. Butch Penny dropped forms off today for RB's signature, and request to pick forms up with printed Rx as she planned to mail patient assistance in. I have made Butch Penny aware that our office can fax forms in, as mailing forms may take a little longer. Butch Penny states she is going to bring documentation of pt's income by our office so that we can fax forms in.

## 2016-12-09 ENCOUNTER — Other Ambulatory Visit: Payer: Self-pay | Admitting: *Deleted

## 2016-12-09 MED ORDER — UMECLIDINIUM-VILANTEROL 62.5-25 MCG/INH IN AEPB
1.0000 | INHALATION_SPRAY | Freq: Every day | RESPIRATORY_TRACT | 3 refills | Status: DC
Start: 2016-12-09 — End: 2016-12-09

## 2016-12-09 MED ORDER — UMECLIDINIUM-VILANTEROL 62.5-25 MCG/INH IN AEPB
1.0000 | INHALATION_SPRAY | Freq: Every day | RESPIRATORY_TRACT | 3 refills | Status: DC
Start: 2016-12-09 — End: 2017-01-01

## 2016-12-09 NOTE — Telephone Encounter (Signed)
Patient Assistance forms have been faxed in. Nothing further was needed at this time.

## 2016-12-30 ENCOUNTER — Encounter: Payer: Self-pay | Admitting: Emergency Medicine

## 2016-12-30 ENCOUNTER — Ambulatory Visit (INDEPENDENT_AMBULATORY_CARE_PROVIDER_SITE_OTHER): Payer: Medicare Other | Admitting: Emergency Medicine

## 2016-12-30 DIAGNOSIS — G4733 Obstructive sleep apnea (adult) (pediatric): Secondary | ICD-10-CM | POA: Diagnosis not present

## 2016-12-30 DIAGNOSIS — Z9989 Dependence on other enabling machines and devices: Secondary | ICD-10-CM

## 2016-12-30 DIAGNOSIS — J439 Emphysema, unspecified: Secondary | ICD-10-CM

## 2016-12-30 DIAGNOSIS — C3401 Malignant neoplasm of right main bronchus: Secondary | ICD-10-CM | POA: Diagnosis not present

## 2016-12-30 DIAGNOSIS — J301 Allergic rhinitis due to pollen: Secondary | ICD-10-CM

## 2016-12-30 MED ORDER — UMECLIDINIUM-VILANTEROL 62.5-25 MCG/INH IN AEPB: 1.0000 | INHALATION_SPRAY | Freq: Every day | RESPIRATORY_TRACT | 0 refills | Status: AC

## 2016-12-30 NOTE — Assessment & Plan Note (Signed)
Tolerating CPAP. It is in good repair. He will continue this every night.

## 2016-12-30 NOTE — Assessment & Plan Note (Signed)
Please continue Anoro and albuterol as you are using them Continue oxygen at 2-3 L/m We will work on the financial assistance program before [employer], confirmed that paperwork has been faxed If you are having difficulty clearing thick mucous, consider starting mucinex 600-1200mg  daily to see if this helps.  Get the flu shot in the Fall Follow with Dr Lamonte Sakai in 4 months or sooner if you have any problems.

## 2016-12-30 NOTE — Progress Notes (Signed)
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 Was admitted 5/31 with acute dyspnea, found to be in A fib + RVR. Started on amiodarone. Converted to NSR 11/11/12. Scheduled for repeat Ct scan chest (Dr Katheran Awe) for surveillance on 11/25/12. During his hospitalization, we started the eval for his presumed COPD.  He has been on Spiriva before, SABA before, didn't notice any benefit.   ROV 08/14/15 -- follow-up visit for history of COPD, OSA on CPAP. He also has a history of adenocarcinoma of the lung followed by Dr Katheran Awe, treated with chemoradiation. He has not any evidence clinically of recurrence.  He was admitted end of 11/16 with CAP and Ae-COPD. No CT chest in several years, CXR without evidence recurrence in December. He is not on scheduled BD, didn't respond to Spiriva.   ROV 03/07/16 -- patient has a history of COPD, obstructive sleep apnea and non-small cell lung cancer (adenocarcinoma) that was treated with chemoradiation. Most recent CT scan of the chest was reassuring for no recurrence of disease in July 2017. He was seen here earlier this month with significant wheezing, progressive dyspnea. He had not been on scheduled bronchodilators and these were retried as he started Anoro. He continues to have severe exertional SOB. He is not clear that the Anoro has helped him any. He continues to have wheeze. He has had some chills over the last 2 days.   ROV 04/09/16 -- this is a follow-up visit for history of COPD, obstructive sleep apnea on CPAP, non-small cell lung cancer treated with chemoradiation. I saw him about a month ago with an acute exacerbation in the setting of community-acquired versus aspiration pneumonia. He was treated with antibiotics, steroids. He is improved, probably back to baseline. O2 at 2-3L/min. He is on Anoro, is unclear whether it has helped him much. He uses albuterol about 2-3x a  day. He is on zyrtec. Flu shot up to date. He has very good compliance with his CPAP.   ROV 10/07/16 -- patient follows up today for history of COPD, objective sleep apnea on CPAP, non-small cell lung cancer of the right middle lobe treated with chemotherapy and radiation. Also with a remote history of DVT, hypertension with diastolic CHF. He has been managed on Anoro - he is very reliable w his CPAP, compliance report shows 100% usage > 4h. Good clinical benefit. He is concerned that the Anoro is not helping him. He states that his exercise tolerance has decreased compared with last visit. Notes it w walking. No CP, tightness. No wt change. He uses lasix prn for edema, has been using a bit more lately. His iron level has been low - just received Fe infusion 1 weeks ago. He states tha this A fib has been very labile - tachy / brady/   ROV 12/30/16 -- patient has a history of COPD, obstructive sleep apnea on CPAP, non-small cell lung cancer of the right middle lobe (chemotherapy, radiation). He also has a remote history of DVT, hypertension with diastolic CHF. He has been managed with Anoro and we have worked on getting patient assistance for him for the cost. He reports that he stopped Anoro for a brief period - missed it so he restarted it. He has some difficulty w secretions, thick. May be a bit better since last time. He uses albuterol about 0-1x a day. Remains on zyrtec, O2 at 2-3L/min. Dr  Enniver following with serial CXR's, next in October.     Vitals:   12/30/16 1434  BP: 118/64  Pulse: 63  SpO2: 92%  Weight: 222 lb (100.7 kg)  Height: 5\' 10"  (1.778 m)   Gen: Pleasant, elderly man, in no distress,  normal affect  ENT: No lesions,  mouth clear,  oropharynx clear, no postnasal drip  Neck: No JVD, no TMG, no carotid bruits  Lungs: No use of accessory muscles, clar B   Cardiovascular: RRR, heart sounds normal, no murmur or gallops, no peripheral edema  Musculoskeletal: No deformities, no  cyanosis or clubbing  Neuro: alert, non focal  Skin: Warm, no lesions or rashes    OSA on CPAP Tolerating CPAP. It is in good repair. He will continue this every night.  Malignant neoplasm of hilus of right lung (HCC) Followed by Dr Katheran Awe, currently on observation w serial CXR's. Next in October.   Allergic rhinitis Continue same medications  COPD (chronic obstructive pulmonary disease) ([place]) Please continue Anoro and albuterol as you are using them Continue oxygen at 2-3 L/m We will work on the financial assistance program before [employer], confirmed that paperwork has been faxed If you are having difficulty clearing thick mucous, consider starting mucinex 600-1200mg  daily to see if this helps.  Get the flu shot in the Fall Follow with Dr Lamonte Sakai in 4 months or sooner if you have any problems.   [name], MD, PhD 12/30/2016, 2:48 PM [facility] [phone] or if no answer [phone]

## 2016-12-30 NOTE — Assessment & Plan Note (Signed)
Continue same medications.

## 2016-12-30 NOTE — Addendum Note (Signed)
Addended by: Maryanna Shape A on: 12/30/2016 04:04 PM   Modules accepted: Orders

## 2016-12-30 NOTE — Patient Instructions (Signed)
Please continue Anoro and albuterol as you are using them Continue oxygen at 2-3 L/m We will work on the financial assistance program before [employer], confirmed that paperwork has been faxed Continue CPAP every night If you are having difficulty clearing thick mucous, consider starting mucinex 600-1200mg  daily to see if this helps.  Get the flu shot in the Fall Follow with Dr Lamonte Sakai in 4 months or sooner if you have any problems.

## 2016-12-30 NOTE — Assessment & Plan Note (Signed)
Followed by Dr Katheran Awe, currently on observation w serial CXR's. Next in October.

## 2016-12-31 ENCOUNTER — Telehealth: Payer: Self-pay | Admitting: Emergency Medicine

## 2016-12-31 NOTE — Telephone Encounter (Signed)
These forms were faxed yesterday by [facility] and she received fax confirmation. Reece Packer of this information. Nothing further was needed.

## 2017-01-01 ENCOUNTER — Telehealth: Payer: Self-pay | Admitting: Emergency Medicine

## 2017-01-01 MED ORDER — UMECLIDINIUM-VILANTEROL 62.5-25 MCG/INH IN AEPB: 1.0000 | INHALATION_SPRAY | Freq: Every day | RESPIRATORY_TRACT | 12 refills | Status: DC

## 2017-01-01 NOTE — Telephone Encounter (Signed)
Spoke with Butch Penny. She stated that [employer] had received the forms for patient assistance for the Anoro inhaler. They will go ahead and process the order but will need a RX. Advised her that I will print out the RX and fax it to them.   Nothing else was needed at time of call.

## 2017-01-15 ENCOUNTER — Ambulatory Visit (HOSPITAL_BASED_OUTPATIENT_CLINIC_OR_DEPARTMENT_OTHER): Payer: Medicare Other

## 2017-01-15 VITALS — BP 111/60 | HR 77 | Resp 16

## 2017-01-15 DIAGNOSIS — Z452 Encounter for adjustment and management of vascular access device: Secondary | ICD-10-CM | POA: Diagnosis not present

## 2017-01-15 DIAGNOSIS — Z85118 Personal history of other malignant neoplasm of bronchus and lung: Secondary | ICD-10-CM

## 2017-01-15 DIAGNOSIS — C3491 Malignant neoplasm of unspecified part of right bronchus or lung: Secondary | ICD-10-CM

## 2017-01-15 MED ORDER — HEPARIN SOD (PORK) LOCK FLUSH 100 UNIT/ML IV SOLN
500.0000 [IU] | Freq: Once | INTRAVENOUS | Status: AC
  Administered 2017-01-15: 500 [IU] via INTRAVENOUS
  Filled 2017-01-15: qty 5

## 2017-01-15 MED ORDER — SODIUM CHLORIDE 0.9% FLUSH
10.0000 mL | INTRAVENOUS | Status: DC | PRN
  Administered 2017-01-15: 10 mL via INTRAVENOUS
  Filled 2017-01-15: qty 10

## 2017-03-26 ENCOUNTER — Telehealth: Payer: Self-pay | Admitting: *Deleted

## 2017-03-26 ENCOUNTER — Other Ambulatory Visit (HOSPITAL_BASED_OUTPATIENT_CLINIC_OR_DEPARTMENT_OTHER): Payer: Medicare Other

## 2017-03-26 ENCOUNTER — Ambulatory Visit (HOSPITAL_BASED_OUTPATIENT_CLINIC_OR_DEPARTMENT_OTHER): Payer: Medicare Other

## 2017-03-26 ENCOUNTER — Ambulatory Visit (HOSPITAL_BASED_OUTPATIENT_CLINIC_OR_DEPARTMENT_OTHER): Payer: Medicare Other | Admitting: Hematology & Oncology

## 2017-03-26 VITALS — BP 96/59 | HR 78 | Temp 97.8°F | Resp 20 | Wt 220.0 lb

## 2017-03-26 DIAGNOSIS — Z85118 Personal history of other malignant neoplasm of bronchus and lung: Secondary | ICD-10-CM | POA: Diagnosis not present

## 2017-03-26 DIAGNOSIS — Z9981 Dependence on supplemental oxygen: Secondary | ICD-10-CM | POA: Diagnosis not present

## 2017-03-26 DIAGNOSIS — K909 Intestinal malabsorption, unspecified: Secondary | ICD-10-CM

## 2017-03-26 DIAGNOSIS — J449 Chronic obstructive pulmonary disease, unspecified: Secondary | ICD-10-CM

## 2017-03-26 DIAGNOSIS — D500 Iron deficiency anemia secondary to blood loss (chronic): Secondary | ICD-10-CM

## 2017-03-26 DIAGNOSIS — D508 Other iron deficiency anemias: Secondary | ICD-10-CM

## 2017-03-26 DIAGNOSIS — J42 Unspecified chronic bronchitis: Secondary | ICD-10-CM

## 2017-03-26 DIAGNOSIS — C3401 Malignant neoplasm of right main bronchus: Secondary | ICD-10-CM

## 2017-03-26 DIAGNOSIS — Z7901 Long term (current) use of anticoagulants: Secondary | ICD-10-CM

## 2017-03-26 DIAGNOSIS — I480 Paroxysmal atrial fibrillation: Secondary | ICD-10-CM

## 2017-03-26 LAB — CBC WITH DIFFERENTIAL (CANCER CENTER ONLY)
BASO#: 0 10*3/uL (ref 0.0–0.2)
BASO%: 0.3 % (ref 0.0–2.0)
EOS%: 1 % (ref 0.0–7.0)
Eosinophils Absolute: 0.1 10*3/uL (ref 0.0–0.5)
HCT: 45.1 % (ref 38.7–49.9)
HEMOGLOBIN: 15.1 g/dL (ref 13.0–17.1)
LYMPH#: 1.8 10*3/uL (ref 0.9–3.3)
LYMPH%: 22.4 % (ref 14.0–48.0)
MCH: 30 pg (ref 28.0–33.4)
MCHC: 33.5 g/dL (ref 32.0–35.9)
MCV: 90 fL (ref 82–98)
MONO#: 0.7 10*3/uL (ref 0.1–0.9)
MONO%: 8.5 % (ref 0.0–13.0)
NEUT%: 67.8 % (ref 40.0–80.0)
NEUTROS ABS: 5.4 10*3/uL (ref 1.5–6.5)
Platelets: 182 10*3/uL (ref 145–400)
RBC: 5.03 10*6/uL (ref 4.20–5.70)
RDW: 14.9 % (ref 11.1–15.7)
WBC: 7.9 10*3/uL (ref 4.0–10.0)

## 2017-03-26 LAB — CMP (CANCER CENTER ONLY)
ALBUMIN: 3.1 g/dL — AB (ref 3.3–5.5)
ALK PHOS: 99 U/L — AB (ref 26–84)
ALT: 18 U/L (ref 10–47)
AST: 26 U/L (ref 11–38)
BILIRUBIN TOTAL: 0.7 mg/dL (ref 0.20–1.60)
BUN, Bld: 21 mg/dL (ref 7–22)
CALCIUM: 9.4 mg/dL (ref 8.0–10.3)
CO2: 30 meq/L (ref 18–33)
Chloride: 104 mEq/L (ref 98–108)
Creat: 1.1 mg/dl (ref 0.6–1.2)
Glucose, Bld: 188 mg/dL — ABNORMAL HIGH (ref 73–118)
Potassium: 4.2 mEq/L (ref 3.3–4.7)
Sodium: 139 mEq/L (ref 128–145)
Total Protein: 6.9 g/dL (ref 6.4–8.1)

## 2017-03-26 LAB — IRON AND TIBC
%SAT: 26 % (ref 20–55)
IRON: 87 ug/dL (ref 42–163)
TIBC: 331 ug/dL (ref 202–409)
UIBC: 244 ug/dL (ref 117–376)

## 2017-03-26 LAB — FERRITIN: FERRITIN: 66 ng/mL (ref 22–316)

## 2017-03-26 LAB — LACTATE DEHYDROGENASE: LDH: 210 U/L (ref 125–245)

## 2017-03-26 MED ORDER — IPRATROPIUM-ALBUTEROL 0.5-2.5 (3) MG/3ML IN SOLN
3.0000 mL | Freq: Once | RESPIRATORY_TRACT | Status: AC
  Administered 2017-03-26: 3 mL via RESPIRATORY_TRACT

## 2017-03-26 MED ORDER — IPRATROPIUM-ALBUTEROL 0.5-2.5 (3) MG/3ML IN SOLN
RESPIRATORY_TRACT | Status: AC
  Filled 2017-03-26: qty 3

## 2017-03-26 MED ORDER — HEPARIN SOD (PORK) LOCK FLUSH 100 UNIT/ML IV SOLN
500.0000 [IU] | Freq: Once | INTRAVENOUS | Status: AC
  Administered 2017-03-26: 500 [IU] via INTRAVENOUS
  Filled 2017-03-26: qty 5

## 2017-03-26 MED ORDER — SODIUM CHLORIDE 0.9% FLUSH
10.0000 mL | INTRAVENOUS | Status: DC | PRN
  Administered 2017-03-26: 10 mL via INTRAVENOUS
  Filled 2017-03-26: qty 10

## 2017-03-26 NOTE — Telephone Encounter (Signed)
-----   Message from Volanda Napoleon, MD sent at 03/26/2017  4:04 PM EDT ----- Call and tell him that his iron levels are okay. Thank you

## 2017-03-26 NOTE — Patient Instructions (Signed)
Implanted Port Insertion, Care After °This sheet gives you information about how to care for yourself after your procedure. Your health care provider may also give you more specific instructions. If you have problems or questions, contact your health care provider. °What can I expect after the procedure? °After your procedure, it is common to have: °· Discomfort at the port insertion site. °· Bruising on the skin over the port. This should improve over 3-4 days. ° °Follow these instructions at home: °Port care °· After your port is placed, you will get a manufacturer's information card. The card has information about your port. Keep this card with you at all times. °· Take care of the port as told by your health care provider. Ask your health care provider if you or a family member can get training for taking care of the port at home. A home health care nurse may also take care of the port. °· Make sure to remember what type of port you have. °Incision care °· Follow instructions from your health care provider about how to take care of your port insertion site. Make sure you: °? Wash your hands with soap and water before you change your bandage (dressing). If soap and water are not available, use hand sanitizer. °? Change your dressing as told by your health care provider. °? Leave stitches (sutures), skin glue, or adhesive strips in place. These skin closures may need to stay in place for 2 weeks or longer. If adhesive strip edges start to loosen and curl up, you may trim the loose edges. Do not remove adhesive strips completely unless your health care provider tells you to do that. °· Check your port insertion site every day for signs of infection. Check for: °? More redness, swelling, or pain. °? More fluid or blood. °? Warmth. °? Pus or a bad smell. °General instructions °· Do not take baths, swim, or use a hot tub until your health care provider approves. °· Do not lift anything that is heavier than 10 lb (4.5  kg) for a week, or as told by your health care provider. °· Ask your health care provider when it is okay to: °? Return to work or school. °? Resume usual physical activities or sports. °· Do not drive for 24 hours if you were given a medicine to help you relax (sedative). °· Take over-the-counter and prescription medicines only as told by your health care provider. °· Wear a medical alert bracelet in case of an emergency. This will tell any health care providers that you have a port. °· Keep all follow-up visits as told by your health care provider. This is important. °Contact a health care provider if: °· You cannot flush your port with saline as directed, or you cannot draw blood from the port. °· You have a fever or chills. °· You have more redness, swelling, or pain around your port insertion site. °· You have more fluid or blood coming from your port insertion site. °· Your port insertion site feels warm to the touch. °· You have pus or a bad smell coming from the port insertion site. °Get help right away if: °· You have chest pain or shortness of breath. °· You have bleeding from your port that you cannot control. °Summary °· Take care of the port as told by your health care provider. °· Change your dressing as told by your health care provider. °· Keep all follow-up visits as told by your health care provider. °  This information is not intended to replace advice given to you by your health care provider. Make sure you discuss any questions you have with your health care provider. °Document Released: 03/17/2013 Document Revised: 04/17/2016 Document Reviewed: 04/17/2016 °Elsevier Interactive Patient Education © 2017 [employer] ° °

## 2017-03-26 NOTE — Addendum Note (Signed)
Addended by: Rico Ala on: 03/26/2017 11:48 AM   Modules accepted: Orders

## 2017-03-26 NOTE — Progress Notes (Signed)
Hematology and Oncology Follow Up Visit  [name] [id number] [birth year] 83 y.o. 03/26/2017   Principle Diagnosis:  . 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 2. Paroxysmal atrial fibrillation. 3. 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 4. Iron deficiency anemia due to malabsorption  Current Therapy:   Eliquis 5 mg p.o. B.i.d. IV Feraheme as indicated     Interim History:  [name] is back for followup.he is oxygen dependent. He has bad underlying COPD and also cardiac issues. He has atrial fibrillation. He is on ELIQUIS.  His main problem is his breathing. He is on supplemental oxygen. He just gets short winded very quickly.  He is on no nebulizers at home. He has some inhalers. I would think that a nebulizer might be more effective for him. We will try nebulizer in the office.   We did give him IV iron year ago. This is helped him tremendously. His hemoglobin has come up quite nicely.  He's had no nausea or vomiting. He's had no cough.  This was a tough summer for him because of the high humidity.   He's had no obvious change in bowel or latter habits.  Overall, his performance status is ECOG 2.   Medications:  Current Outpatient Prescriptions:  .  apixaban (ELIQUIS) 5 MG TABS tablet, Take 1 tablet (5 mg total) by mouth 2 (two) times daily., Disp: 60 tablet, Rfl: 12 .  atorvastatin (LIPITOR) 20 MG tablet, Take 20 mg by mouth every morning., Disp: , Rfl:  .  Calcium Carbonate-Vitamin D (CALCIUM 600+D) 600-400 MG-UNIT tablet, Take 1 tablet by mouth daily., Disp: , Rfl:  .  cetirizine (ZYRTEC) 10 MG tablet, Take 10 mg by mouth daily. , Disp: , Rfl:  .  diltiazem (CARDIZEM CD) 120 MG 24 hr capsule, Take 120 mg by mouth daily., Disp: , Rfl:  .  Exenatide ER (BYDUREON) 2 MG PEN, Inject 2 mg into the skin once a week. On Sundays, Disp: , Rfl:  .  furosemide (LASIX) 40 MG tablet, Take 1 tablet (40 mg total) by  mouth 2 (two) times daily., Disp: 30 tablet, Rfl: 12 .  glucosamine-chondroitin 500-400 MG tablet, Take 1 tablet by mouth 2 (two) times daily. , Disp: , Rfl:  .  Insulin Detemir (LEVEMIR FLEXTOUCH) 100 UNIT/ML Pen, Inject into the skin 2 (two) times daily as needed (CBG >100). 02/14/2016 If CBG >100 inject 20 units subcutaneously with breakfast and 16 units with supper., Disp: , Rfl:  .  insulin lispro (HUMALOG) 100 UNIT/ML injection, Inject 5 Units into the skin 3 (three) times daily before meals., Disp: , Rfl:  .  levothyroxine (SYNTHROID, LEVOTHROID) 25 MCG tablet, Take 25 mcg by mouth daily before breakfast., Disp: , Rfl:  .  metFORMIN (GLUCOPHAGE-XR) 500 MG 24 hr tablet, Take 500 mg by mouth., Disp: , Rfl:  .  Multiple Vitamin (MULTIVITAMIN) tablet, Take 1 tablet by mouth daily., Disp: , Rfl:  .  ONETOUCH VERIO test strip, Inject 1 each into the skin every morning., Disp: , Rfl:  .  oxybutynin (DITROPAN) 5 MG tablet, Take 5 mg by mouth 2 (two) times daily., Disp: , Rfl:  .  PROAIR HFA 108 (90 Base) MCG/ACT inhaler, INHALE TWO PUFFS BY MOUTH EVERY 6 HOURS AS NEEDED FOR WHEEZING AND FOR SHORTNESS OF BREATH, Disp: 9 each, Rfl: 5 .  temazepam (RESTORIL) 15 MG capsule, TAKE ONE CAPSULE BY MOUTH  AT BEDTIME AS NEEDED FOR SLEEP, Disp: 30 capsule, Rfl: 0 .  umeclidinium-vilanterol (ANORO ELLIPTA) 62.5-25 MCG/INH AEPB, Inhale 1 puff into the lungs daily., Disp: 1 each, Rfl: 12 No current facility-administered medications for this visit.   Facility-Administered Medications Ordered in Other Visits:  .  sodium chloride flush (NS) 0.9 % injection 10 mL, 10 mL, Intravenous, PRN, Volanda Napoleon, MD, 10 mL at 11/20/16 1153  Allergies: No Known Allergies  Past Medical History, Surgical history, Social history, and Family History were reviewed and updated.  Review of Systems: As stated in the interim history  Physical Exam:  vitals were not taken for this visit.  Well-developed and well-nourished white  male. Vital signs show temperature 97.8. Pulse 65. Blood pressure 96/60. Weight is 220 pounds. Hent exam shows no ocular or oral lesions. There are no palpable cervical or supraclavicular lymph nodes. Lungs are with decreased breath sounds bilaterally. There is some wheezing in the left base. No rubs are noted. Cardiac exam is regular rate and rhythm consistent with atrial fibrillation. He has a 1/6 systolic murmur. Abdomen is soft. He is mildly obese. He has good bowel sounds. There is no fluid wave. There is no palpable liver or spleen tip. Back exam shows no tenderness over the spine, ribs or hips. Extremity shows no clubbing, cyanosis or edema. Skin exam shows some scattered ecchymoses.   Lab Results  Component Value Date   WBC 7.9 03/26/2017   HGB 15.1 03/26/2017   HCT 45.1 03/26/2017   MCV 90 03/26/2017   PLT 182 03/26/2017     Chemistry      Component Value Date/Time   NA 139 03/26/2017 1007   NA 138 03/27/2016 1025   K 4.2 03/26/2017 1007   K 4.2 03/27/2016 1025   CL 104 03/26/2017 1007   CO2 30 03/26/2017 1007   CO2 24 03/27/2016 1025   BUN 21 03/26/2017 1007   BUN 19.1 03/27/2016 1025   CREATININE 1.1 03/26/2017 1007   CREATININE 1.3 03/27/2016 1025      Component Value Date/Time   CALCIUM 9.4 03/26/2017 1007   CALCIUM 8.7 03/27/2016 1025   ALKPHOS 99 (H) 03/26/2017 1007   ALKPHOS 85 03/27/2016 1025   AST 26 03/26/2017 1007   AST 28 03/27/2016 1025   ALT 18 03/26/2017 1007   ALT 17 03/27/2016 1025   BILITOT 0.70 03/26/2017 1007   BILITOT 0.42 03/27/2016 1025         Impression and Plan: [name] is 83 year old gentleman with a history of locally advanced-stage IIIB-adenocarcinoma of the right lung. He had contralateral mediastinal node involvement. He was treated with radiation and chemotherapy. He had a very nice response. He completed  treatment in December of 2012. Thankfully, His disease has not come back. This I am surprised by because of the extensive  nature of his locally advanced disease.  I feel bad that his breathing still is not doing well.  I will try him on a nebulizer in the office. If the nebulizer helps him, I will let his pulmonologist no so that they will order nebulizers at home for him.  As always, we will plan to get him back to see [place] in another 6 months. He has his Port-A-Cath that we flush every 2 months.    Volanda Napoleon, MD 10/17/201811:02 AM

## 2017-04-10 ENCOUNTER — Other Ambulatory Visit: Payer: Self-pay | Admitting: Emergency Medicine

## 2017-05-06 ENCOUNTER — Encounter: Payer: Self-pay | Admitting: Emergency Medicine

## 2017-05-06 ENCOUNTER — Ambulatory Visit: Payer: Medicare Other | Admitting: Emergency Medicine

## 2017-05-06 DIAGNOSIS — J42 Unspecified chronic bronchitis: Secondary | ICD-10-CM | POA: Diagnosis not present

## 2017-05-06 DIAGNOSIS — Z9989 Dependence on other enabling machines and devices: Secondary | ICD-10-CM | POA: Diagnosis not present

## 2017-05-06 DIAGNOSIS — Z86718 Personal history of other venous thrombosis and embolism: Secondary | ICD-10-CM

## 2017-05-06 DIAGNOSIS — C3401 Malignant neoplasm of right main bronchus: Secondary | ICD-10-CM | POA: Diagnosis not present

## 2017-05-06 DIAGNOSIS — G4733 Obstructive sleep apnea (adult) (pediatric): Secondary | ICD-10-CM | POA: Diagnosis not present

## 2017-05-06 MED ORDER — FLUTICASONE-UMECLIDIN-VILANT 100-62.5-25 MCG/INH IN AEPB: 1.0000 | INHALATION_SPRAY | Freq: Every day | RESPIRATORY_TRACT | 6 refills | Status: DC

## 2017-05-06 NOTE — Assessment & Plan Note (Signed)
Continue CPAP every night. 

## 2017-05-06 NOTE — Assessment & Plan Note (Signed)
On Eliquis for this and also his atrial fibrillation

## 2017-05-06 NOTE — Assessment & Plan Note (Signed)
COPD with an acute exacerbation, sounds like this was brought on by exposures while he was working in the yard.  He is improved with prednisone and levofloxacin.  He tells me that he averages about 3-4 exacerbations annually.  Based on this I agree with adding an inhaled steroid to his regimen.  He was changed to Trelegy from [facility] as a trial.  We will continue this.  We will stop Anoro Continue Trelegy 1 inhalation once daily.  Please remember to rinse and gargle after taking this medication. Please continue albuterol 2 puffs as needed for shortness of breath, coughing, wheezing. Continue your oxygen at all times as you have been using it Follow with Dr Lamonte Sakai in 3 months or sooner if you have any problems.

## 2017-05-06 NOTE — Patient Instructions (Addendum)
We will stop Anoro Continue Trelegy 1 inhalation once daily.  Please remember to rinse and gargle after taking this medication. Please continue albuterol 2 puffs as needed for shortness of breath, coughing, wheezing. Continue your oxygen at all times as you have been using it Continue Zyrtec once a day Continue Eliquis as you have been taking it Follow with Dr Lamonte Sakai in 3 months or sooner if you have any problems.

## 2017-05-06 NOTE — Assessment & Plan Note (Signed)
Following serial chest x-rays with Dr Katheran Awe.  No evidence of recurrence

## 2017-05-06 NOTE — Progress Notes (Signed)
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 Was admitted 5/31 with acute dyspnea, found to be in A fib + RVR. Started on amiodarone. Converted to NSR 11/11/12. Scheduled for repeat Ct scan chest (Dr Katheran Awe) for surveillance on 11/25/12. During his hospitalization, we started the eval for his presumed COPD.  He has been on Spiriva before, SABA before, didn't notice any benefit.   ROV 08/14/15 -- follow-up visit for history of COPD, OSA on CPAP. He also has a history of adenocarcinoma of the lung followed by Dr Katheran Awe, treated with chemoradiation. He has not any evidence clinically of recurrence.  He was admitted end of 11/16 with CAP and Ae-COPD. No CT chest in several years, CXR without evidence recurrence in December. He is not on scheduled BD, didn't respond to Spiriva.   ROV 03/07/16 -- patient has a history of COPD, obstructive sleep apnea and non-small cell lung cancer (adenocarcinoma) that was treated with chemoradiation. Most recent CT scan of the chest was reassuring for no recurrence of disease in July 2017. He was seen here earlier this month with significant wheezing, progressive dyspnea. He had not been on scheduled bronchodilators and these were retried as he started Anoro. He continues to have severe exertional SOB. He is not clear that the Anoro has helped him any. He continues to have wheeze. He has had some chills over the last 2 days.   ROV 04/09/16 -- this is a follow-up visit for history of COPD, obstructive sleep apnea on CPAP, non-small cell lung cancer treated with chemoradiation. I saw him about a month ago with an acute exacerbation in the setting of community-acquired versus aspiration pneumonia. He was treated with antibiotics, steroids. He is improved, probably back to baseline. O2 at 2-3L/min. He is on Anoro, is unclear whether it has helped him much. He uses albuterol about 2-3x a  day. He is on zyrtec. Flu shot up to date. He has very good compliance with his CPAP.   ROV 10/07/16 -- patient follows up today for history of COPD, objective sleep apnea on CPAP, non-small cell lung cancer of the right middle lobe treated with chemotherapy and radiation. Also with a remote history of DVT, hypertension with diastolic CHF. He has been managed on Anoro - he is very reliable w his CPAP, compliance report shows 100% usage > 4h. Good clinical benefit. He is concerned that the Anoro is not helping him. He states that his exercise tolerance has decreased compared with last visit. Notes it w walking. No CP, tightness. No wt change. He uses lasix prn for edema, has been using a bit more lately. His iron level has been low - just received Fe infusion 1 weeks ago. He states tha this A fib has been very labile - tachy / brady/   ROV 12/30/16 -- patient has a history of COPD, obstructive sleep apnea on CPAP, non-small cell lung cancer of the right middle lobe (chemotherapy, radiation). He also has a remote history of DVT, hypertension with diastolic CHF. He has been managed with Anoro and we have worked on getting patient assistance for him for the cost. He reports that he stopped Anoro for a brief period - missed it so he restarted it. He has some difficulty w secretions, thick. May be a bit better since last time. He uses albuterol about 0-1x a day. Remains on zyrtec, O2 at 2-3L/min. Dr  Enniver following with serial CXR's, next in October.    ROV 05/06/17 --this is a follow-up visit for 83 year old gentleman with COPD, obstructive sleep apnea on CPAP and right middle lobe non-small cell lung cancer status post chemotherapy and radiation therapy.  Also with a history of hypertension, diastolic CHF, remote DVT.  He was due to have a repeat CT scan of his chest in October although I do not see that this has been done. He had a CXR in April. He had been doing well until about 1 week ago he developed dyspnea,  chest tightness after he did yard work. He had to start pred and abx > feeling a lot better. He has been on Anoro, was just changed to Trelegy and is tolerating. He tells me that he averages about 3-4 AE's a year.    Vitals:   05/06/17 1122  BP: 130/74  Pulse: 91  SpO2: 93%  Weight: 218 lb 8 oz (99.1 kg)  Height: 5\' 10"  (1.778 m)   Gen: Pleasant, elderly man, in no distress,  normal affect  ENT: No lesions,  mouth clear,  oropharynx clear, no postnasal drip  Neck: No JVD, no TMG, no carotid bruits  Lungs: No use of accessory muscles, clar B   Cardiovascular: RRR, heart sounds normal, no murmur or gallops, no peripheral edema  Musculoskeletal: No deformities, no cyanosis or clubbing  Neuro: alert, non focal  Skin: Warm, no lesions or rashes    COPD (chronic obstructive pulmonary disease) (HCC) COPD with an acute exacerbation, sounds like this was brought on by exposures while he was working in the yard.  He is improved with prednisone and levofloxacin.  He tells me that he averages about 3-4 exacerbations annually.  Based on this I agree with adding an inhaled steroid to his regimen.  He was changed to Trelegy from [facility] as a trial.  We will continue this.  We will stop Anoro Continue Trelegy 1 inhalation once daily.  Please remember to rinse and gargle after taking this medication. Please continue albuterol 2 puffs as needed for shortness of breath, coughing, wheezing. Continue your oxygen at all times as you have been using it Follow with Dr Lamonte Sakai in 3 months or sooner if you have any problems.  Lung cancer Following serial chest x-rays with Dr Katheran Awe.  No evidence of recurrence  History of DVT of right axillary vein On Eliquis for this and also his atrial fibrillation  OSA on CPAP Continue CPAP every night   Patrick Apo, MD, PhD 05/06/2017, 11:50 AM [facility] [phone] or if no answer [phone]

## 2017-05-08 ENCOUNTER — Telehealth: Payer: Self-pay | Admitting: Emergency Medicine

## 2017-05-08 MED ORDER — FLUTICASONE-UMECLIDIN-VILANT 100-62.5-25 MCG/INH IN AEPB: 1.0000 | INHALATION_SPRAY | Freq: Every day | RESPIRATORY_TRACT | 0 refills | Status: DC

## 2017-05-08 NOTE — Telephone Encounter (Signed)
Called and spoke to pt's daughter, Butch Penny.  Butch Penny request that rx for Trelegy for 61mo supply be sent to pt assistant. Rx has been signed by RB and faxed to provided number. One sample of Trelegy has been placed up front for pickup to get pt by until Rx is received. Nothing further needed.

## 2017-05-13 ENCOUNTER — Telehealth: Payer: Self-pay | Admitting: Emergency Medicine

## 2017-05-13 MED ORDER — FLUTICASONE-UMECLIDIN-VILANT 100-62.5-25 MCG/INH IN AEPB: 1.0000 | INHALATION_SPRAY | Freq: Every day | RESPIRATORY_TRACT | 3 refills | Status: DC

## 2017-05-13 NOTE — Telephone Encounter (Signed)
Faxed Rx again to patient assistance, advised daughter and nothing further is needed.

## 2017-05-13 NOTE — Telephone Encounter (Signed)
This is for Trelegy.

## 2017-05-26 ENCOUNTER — Ambulatory Visit (HOSPITAL_BASED_OUTPATIENT_CLINIC_OR_DEPARTMENT_OTHER): Payer: Medicare Other

## 2017-05-26 DIAGNOSIS — Z85118 Personal history of other malignant neoplasm of bronchus and lung: Secondary | ICD-10-CM | POA: Diagnosis not present

## 2017-05-26 DIAGNOSIS — Z452 Encounter for adjustment and management of vascular access device: Secondary | ICD-10-CM | POA: Diagnosis not present

## 2017-05-26 DIAGNOSIS — C3401 Malignant neoplasm of right main bronchus: Secondary | ICD-10-CM

## 2017-05-26 MED ORDER — HEPARIN SOD (PORK) LOCK FLUSH 100 UNIT/ML IV SOLN
500.0000 [IU] | Freq: Once | INTRAVENOUS | Status: AC
  Administered 2017-05-26: 500 [IU] via INTRAVENOUS
  Filled 2017-05-26: qty 5

## 2017-05-26 MED ORDER — SODIUM CHLORIDE 0.9% FLUSH
10.0000 mL | INTRAVENOUS | Status: DC | PRN
  Administered 2017-05-26: 10 mL via INTRAVENOUS
  Filled 2017-05-26: qty 10

## 2017-05-26 NOTE — Patient Instructions (Signed)
Implanted Port Insertion, Care After °This sheet gives you information about how to care for yourself after your procedure. Your health care provider may also give you more specific instructions. If you have problems or questions, contact your health care provider. °What can I expect after the procedure? °After your procedure, it is common to have: °· Discomfort at the port insertion site. °· Bruising on the skin over the port. This should improve over 3-4 days. ° °Follow these instructions at home: °Port care °· After your port is placed, you will get a manufacturer's information card. The card has information about your port. Keep this card with you at all times. °· Take care of the port as told by your health care provider. Ask your health care provider if you or a family member can get training for taking care of the port at home. A home health care nurse may also take care of the port. °· Make sure to remember what type of port you have. °Incision care °· Follow instructions from your health care provider about how to take care of your port insertion site. Make sure you: °? Wash your hands with soap and water before you change your bandage (dressing). If soap and water are not available, use hand sanitizer. °? Change your dressing as told by your health care provider. °? Leave stitches (sutures), skin glue, or adhesive strips in place. These skin closures may need to stay in place for 2 weeks or longer. If adhesive strip edges start to loosen and curl up, you may trim the loose edges. Do not remove adhesive strips completely unless your health care provider tells you to do that. °· Check your port insertion site every day for signs of infection. Check for: °? More redness, swelling, or pain. °? More fluid or blood. °? Warmth. °? Pus or a bad smell. °General instructions °· Do not take baths, swim, or use a hot tub until your health care provider approves. °· Do not lift anything that is heavier than 10 lb (4.5  kg) for a week, or as told by your health care provider. °· Ask your health care provider when it is okay to: °? Return to work or school. °? Resume usual physical activities or sports. °· Do not drive for 24 hours if you were given a medicine to help you relax (sedative). °· Take over-the-counter and prescription medicines only as told by your health care provider. °· Wear a medical alert bracelet in case of an emergency. This will tell any health care providers that you have a port. °· Keep all follow-up visits as told by your health care provider. This is important. °Contact a health care provider if: °· You cannot flush your port with saline as directed, or you cannot draw blood from the port. °· You have a fever or chills. °· You have more redness, swelling, or pain around your port insertion site. °· You have more fluid or blood coming from your port insertion site. °· Your port insertion site feels warm to the touch. °· You have pus or a bad smell coming from the port insertion site. °Get help right away if: °· You have chest pain or shortness of breath. °· You have bleeding from your port that you cannot control. °Summary °· Take care of the port as told by your health care provider. °· Change your dressing as told by your health care provider. °· Keep all follow-up visits as told by your health care provider. °  This information is not intended to replace advice given to you by your health care provider. Make sure you discuss any questions you have with your health care provider. °Document Released: 03/17/2013 Document Revised: 04/17/2016 Document Reviewed: 04/17/2016 °Elsevier Interactive Patient Education © 2017 [employer] ° °

## 2017-06-25 ENCOUNTER — Encounter: Payer: Self-pay | Admitting: *Deleted

## 2017-07-21 ENCOUNTER — Telehealth: Payer: Self-pay | Admitting: Emergency Medicine

## 2017-07-21 DIAGNOSIS — J42 Unspecified chronic bronchitis: Secondary | ICD-10-CM

## 2017-07-21 MED ORDER — ALBUTEROL SULFATE (2.5 MG/3ML) 0.083% IN NEBU: 2.5000 mg | INHALATION_SOLUTION | Freq: Four times a day (QID) | RESPIRATORY_TRACT | 2 refills | Status: DC | PRN

## 2017-07-21 NOTE — Telephone Encounter (Signed)
Spoke with pt's daughter Butch Penny (dpr on file), states that pt is c/o worsening SOB with any exertion Xfew weeks.  States pt has difficulty standing up or walking to the restroom.  Also notes worsening general fatigue- states that the dyspnea has triggered aFib episodes leading to worsening weakness.   Denies chest pain, fever, sinus congestion, mucus production, wheezing.     Daughter notes that in the past they have discussed starting pt on a nebulizer-wants to know if this is a good option.  Pt uses [facility] in [place].    RB please advise.  Thanks.

## 2017-07-21 NOTE — Telephone Encounter (Signed)
Spoke with pt's daughter, aware of RB's recs. Pt is already scheduled for ov with RB on 2/26 and refused to schedule sooner with a NP.  Neb machine and albuterol orders sent in.  Nothing further needed.

## 2017-07-21 NOTE — Telephone Encounter (Signed)
Difficult to sort out whether the dyspnea is causing the A fib, versus the other way around. Bronchodilators can sometimes drive A fib, will need to be careful if we use. OK with me for a trial albuterol nebs q6h prn for dyspnea to see if he benefits. Given the time course and the pattern, I believe he needs to be seen by [place] or cards or both.

## 2017-07-22 ENCOUNTER — Encounter: Payer: Self-pay | Admitting: Adult Health

## 2017-07-22 ENCOUNTER — Ambulatory Visit (INDEPENDENT_AMBULATORY_CARE_PROVIDER_SITE_OTHER)
Admission: RE | Admit: 2017-07-22 | Discharge: 2017-07-22 | Disposition: A | Discharge status: Home / Self Care | Payer: Medicare Other | Source: Ambulatory Visit | Attending: Adult Health | Admitting: Adult Health

## 2017-07-22 ENCOUNTER — Other Ambulatory Visit (INDEPENDENT_AMBULATORY_CARE_PROVIDER_SITE_OTHER): Payer: Medicare Other

## 2017-07-22 ENCOUNTER — Ambulatory Visit: Payer: Medicare Other | Admitting: Adult Health

## 2017-07-22 ENCOUNTER — Other Ambulatory Visit: Payer: Self-pay | Admitting: *Deleted

## 2017-07-22 VITALS — BP 116/74 | HR 85 | Temp 97.5°F | Ht 70.0 in | Wt 221.8 lb

## 2017-07-22 DIAGNOSIS — J181 Lobar pneumonia, unspecified organism: Secondary | ICD-10-CM

## 2017-07-22 DIAGNOSIS — R0602 Shortness of breath: Secondary | ICD-10-CM

## 2017-07-22 DIAGNOSIS — C3401 Malignant neoplasm of right main bronchus: Secondary | ICD-10-CM

## 2017-07-22 DIAGNOSIS — J189 Pneumonia, unspecified organism: Secondary | ICD-10-CM

## 2017-07-22 DIAGNOSIS — J90 Pleural effusion, not elsewhere classified: Secondary | ICD-10-CM | POA: Diagnosis not present

## 2017-07-22 DIAGNOSIS — J441 Chronic obstructive pulmonary disease with (acute) exacerbation: Secondary | ICD-10-CM

## 2017-07-22 LAB — BASIC METABOLIC PANEL
BUN: 19 mg/dL (ref 6–23)
CALCIUM: 8.9 mg/dL (ref 8.4–10.5)
CO2: 34 mEq/L — ABNORMAL HIGH (ref 19–32)
Chloride: 99 mEq/L (ref 96–112)
Creatinine, Ser: 1.24 mg/dL (ref 0.40–1.50)
GFR: 58.08 mL/min — AB (ref >60.00)
Glucose, Bld: 145 mg/dL — ABNORMAL HIGH (ref 70–99)
Potassium: 3.5 mEq/L (ref 3.5–5.1)
SODIUM: 140 meq/L (ref 135–145)

## 2017-07-22 LAB — CBC WITH DIFFERENTIAL/PLATELET
BASOS PCT: 0.4 % (ref 0.0–3.0)
Basophils Absolute: 0 10*3/uL (ref 0.0–0.1)
EOS ABS: 0.1 10*3/uL (ref 0.0–0.7)
Eosinophils Relative: 1.4 % (ref 0.0–5.0)
HCT: 47.6 % (ref 39.0–52.0)
HEMOGLOBIN: 16 g/dL (ref 13.0–17.0)
LYMPHS ABS: 1.7 10*3/uL (ref 0.7–4.0)
Lymphocytes Relative: 22.3 % (ref 12.0–46.0)
MCHC: 33.6 g/dL (ref 30.0–36.0)
MCV: 90.3 fl (ref 78.0–100.0)
MONO ABS: 0.7 10*3/uL (ref 0.1–1.0)
Monocytes Relative: 9.3 % (ref 3.0–12.0)
NEUTROS ABS: 4.9 10*3/uL (ref 1.4–7.7)
NEUTROS PCT: 66.6 % (ref 43.0–77.0)
PLATELETS: 222 10*3/uL (ref 150.0–400.0)
RBC: 5.27 Mil/uL (ref 4.22–5.81)
RDW: 14.9 % (ref 11.5–15.5)
WBC: 7.4 10*3/uL (ref 4.0–10.5)

## 2017-07-22 LAB — BRAIN NATRIURETIC PEPTIDE: Pro B Natriuretic peptide (BNP): 122 pg/mL — ABNORMAL HIGH (ref 0.0–100.0)

## 2017-07-22 LAB — SEDIMENTATION RATE: Sed Rate: 12 mm/hr (ref 0–20)

## 2017-07-22 MED ORDER — LEVALBUTEROL HCL 0.63 MG/3ML IN NEBU
0.6300 mg | INHALATION_SOLUTION | Freq: Once | RESPIRATORY_TRACT | Status: AC
Start: 2017-07-22 — End: 2017-07-22
  Administered 2017-07-22: 0.63 mg via RESPIRATORY_TRACT

## 2017-07-22 MED ORDER — AMOXICILLIN-POT CLAVULANATE 875-125 MG PO TABS: 1.0000 | ORAL_TABLET | Freq: Two times a day (BID) | ORAL | 0 refills | Status: DC

## 2017-07-22 MED ORDER — ALBUTEROL SULFATE (2.5 MG/3ML) 0.083% IN NEBU: 2.5000 mg | INHALATION_SOLUTION | Freq: Four times a day (QID) | RESPIRATORY_TRACT | 2 refills | Status: DC | PRN

## 2017-07-22 NOTE — Assessment & Plan Note (Signed)
Serial scans have been negative for recurrence.  If symptoms do not improve or effusion does not resolve.  Can consider possible thoracentesis and send for cytology if indicated.

## 2017-07-22 NOTE — Assessment & Plan Note (Signed)
Possible right-sided pneumonia with associated pleural effusion.  On the right Patient is clinically stable and is taking in fluids and food without difficulty.  He has home oxygen.  We will try to treat this on an outpatient basis with close follow-up.  Have advised patient and his daughter that if he has any symptoms that are worsening with shortness of breath or is unable to take in food of liquids that he will need to be admitted to the hospital.  He will return back in 1 week for a chest x-ray and close follow-up.  Patient will begin Augmentin for 7 days.  He is to use his oxygen on continuous flow at 3 L.  Plan  Patient Instructions  Augmentin 875mg  Twice daily  For 7 days , take with food.  Mucinex DM Twice daily  As needed  Cough/congestion  Extra lasix x 2 days .  Continue on oxygen 3l/m -continuous flow  Follow up with Tionna Gigante NP or Dr. Lamonte Sakai  In 1 week with chest xray .  Please contact office for sooner follow up if symptoms do not improve or worsen or seek emergency care

## 2017-07-22 NOTE — Patient Instructions (Signed)
Augmentin 875mg  Twice daily  For 7 days , take with food.  Mucinex DM Twice daily  As needed  Cough/congestion  Extra lasix x 2 days .  Continue on oxygen 3l/m -continuous flow  Follow up with Rie Mcneil NP or Dr. Lamonte Sakai  In 1 week with chest xray .  Please contact office for sooner follow up if symptoms do not improve or worsen or seek emergency care

## 2017-07-22 NOTE — Assessment & Plan Note (Signed)
Patient has a chronic small pleural effusion on the right.  This is increased to moderate size with associated possible pneumonia plus or minus diastolic dysfunction.  Patient will take antibiotics as indicated.  Will hold on thoracentesis at this time.  Patient will also use extra Lasix for the next 2 days.  Lab work including be met and BNP are pending.  Plan  Patient Instructions  Augmentin 888m Twice daily  For 7 days , take with food.  Mucinex DM Twice daily  As needed  Cough/congestion  Extra lasix x 2 days .  Continue on oxygen 3l/m -continuous flow  Follow up with Parrett NP or Dr. BLamonte Sakai In 1 week with chest xray .  Please contact office for sooner follow up if symptoms do not improve or worsen or seek emergency care

## 2017-07-22 NOTE — Assessment & Plan Note (Signed)
Mild flare with associated pneumonia and effusion.  Xopenex nebulizer was given in the office.  Will hold on steroids at this time.  Patient is continue on her current regimen.  I may use in his albuterol nebulizer at home when available as needed.  Plan  Patient Instructions  Augmentin 875mg  Twice daily  For 7 days , take with food.  Mucinex DM Twice daily  As needed  Cough/congestion  Extra lasix x 2 days .  Continue on oxygen 3l/m -continuous flow  Follow up with Laurina Fischl NP or Dr. Lamonte Sakai  In 1 week with chest xray .  Please contact office for sooner follow up if symptoms do not improve or worsen or seek emergency care

## 2017-07-22 NOTE — Progress Notes (Signed)
_0  ID: [name], male    DOB: 09/15/1924, 84 y.o.   MRN: [id number]  Chief Complaint  Patient presents with  . Acute Visit    COPD     Referring provider: Prince Solian, MD  HPI: 84 year old male former smoker followed for COPD, obstructive sleep apnea on CPAP.  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  Patient complains over the last 3 weeks he has noticed that his breathing is not been doing as good.  Over the last week he has had increased cough congestion and more shortness of breath.  He is also felt more fatigued and low energy.  He denies any hemoptysis chest pain orthopnea, or fever.  Patient says appetite is good with no nausea vomiting or diarrhea.  Patient does have chronic diastolic heart failure.  He is on Lasix 40 mg daily with the option to increase to 2 daily if his weight increases.  He did notice a 2 pound increase in his weight.  He took an extra Lasix today.  Patient is maintained on TRELEGY .  Does feel that it gives him a sore throat periodically. Patient says he is quite independent.  He does his housework and yardwork.  His daughter is with him today who is a retired Designer, jewellery.    No Known Allergies  Immunization History  Administered Date(s) Administered  . Influenza Split 04/25/2011, 03/10/2012, 03/10/2013  . Influenza, High Dose Seasonal PF 03/28/2016  . Influenza,inj,Quad PF,6+ Mos 03/16/2014  . Influenza-Unspecified 05/09/2017  . Pneumococcal Conjugate-13 12/28/2015    Past Medical History:  Diagnosis Date  . Acute diastolic CHF (congestive heart failure) ([place])   . Allergic conjunctivitis   . Allergic rhinitis   . Atrial fibrillation ([place])   . BPH (benign prostatic hyperplasia)   . Cataract   . Cellulitis    hx  . COPD (chronic obstructive pulmonary disease) ([place])       . Diabetic peripheral neuropathy ([place])    . Diverticulosis    hx  . DJD (degenerative joint disease)    Left Knee  . DVT (deep venous thrombosis) ([place])    IN RIGHT ARM 11/2010   . Hearing loss   . Hyperlipidemia   . Hypertension   . Iron deficiency anemia 03/27/2016  . Iron deficiency anemia due to chronic blood loss 03/27/2016  . Lung cancer ([place])     history of stage IIIA non-small cell lung cancer who is currently being treated with both  Radiation and Chemotherapy  . Malabsorption of iron 03/27/2016  . Obstructive sleep apnea (adult)  10/29/2012   This patient has mild obstructive sleep apnea, tested in March 2014 at [facility] sleep. He had been a CPAP user for 14 years. AHi 10.7 He was titrated to a pressure of 9 cm water( after auto - titration). CPAP at night  --- 8-10 YR AGO....,OSA-diagnosed 2000  . Pneumonia    history  . Secondary diabetes with peripheral neuropathy ([place]) 11/07/2012    Tobacco History: Social History   Tobacco Use  Smoking Status Former Smoker  . Packs/day: 1.00  . Years: 50.00  . Pack years: 50.00  . Types: Cigarettes  . Start date: 05/18/1943  . Last attempt to quit: 06/10/1992  . Years since quitting: 25.1  Smokeless Tobacco Never Used  Tobacco Comment   quit tobacco 3 years ago   Counseling  given: Not Answered Comment: quit tobacco 3 years ago   Outpatient Encounter Medications as of 07/22/2017  Medication Sig  . apixaban (ELIQUIS) 5 MG TABS tablet Take 1 tablet (5 mg total) by mouth 2 (two) times daily.  Marland Kitchen atorvastatin (LIPITOR) 20 MG tablet Take 20 mg by mouth every morning.  . Calcium Carbonate-Vitamin D (CALCIUM 600+D) 600-400 MG-UNIT tablet Take 1 tablet by mouth daily.  . cetirizine (ZYRTEC) 10 MG tablet Take 10 mg by mouth daily.   Marland Kitchen diltiazem (CARDIZEM CD) 120 MG 24 hr capsule Take 120 mg by mouth daily.  . Fluticasone-Umeclidin-Vilant (TRELEGY ELLIPTA) 100-62.5-25 MCG/INH AEPB Inhale 1 puff into the lungs daily.  . furosemide (LASIX) 40 MG tablet Take 1 tablet (40 mg total)  by mouth 2 (two) times daily.  Marland Kitchen glucosamine-chondroitin 500-400 MG tablet Take 1 tablet by mouth 2 (two) times daily.   . Insulin Detemir (LEVEMIR FLEXTOUCH) 100 UNIT/ML Pen Inject into the skin 2 (two) times daily as needed (CBG >100). 02/14/2016 If CBG >100 inject 20 units subcutaneously with breakfast and 16 units with supper.  . insulin lispro (HUMALOG) 100 UNIT/ML injection Inject 5 Units into the skin 3 (three) times daily before meals.  Marland Kitchen levothyroxine (SYNTHROID, LEVOTHROID) 25 MCG tablet Take 25 mcg by mouth daily before breakfast.  . metFORMIN (GLUCOPHAGE-XR) 500 MG 24 hr tablet Take 500 mg by mouth 2 (two) times daily after a meal.   . Multiple Vitamin (MULTIVITAMIN) tablet Take 1 tablet by mouth daily.  Glory Rosebush VERIO test strip Inject 1 each into the skin every morning.  Marland Kitchen oxybutynin (DITROPAN) 5 MG tablet Take 5 mg by mouth 2 (two) times daily.  Marland Kitchen PROAIR HFA 108 (90 Base) MCG/ACT inhaler INHALE TWO PUFFS BY MOUTH EVERY 6 HOURS AS NEEDED FOR WHEEZING AND FOR SHORTNESS OF BREATH  . temazepam (RESTORIL) 15 MG capsule TAKE ONE CAPSULE BY MOUTH AT BEDTIME AS NEEDED FOR SLEEP  . amoxicillin-clavulanate (AUGMENTIN) 875-125 MG tablet Take 1 tablet by mouth 2 (two) times daily for 7 days.  . [DISCONTINUED] albuterol (PROVENTIL) (2.5 MG/3ML) 0.083% nebulizer solution Take 3 mLs (2.5 mg total) by nebulization every 6 (six) hours as needed for wheezing or shortness of breath.  . [DISCONTINUED] Exenatide ER (BYDUREON) 2 MG PEN Inject 2 mg into the skin once a week. On Sundays  . [DISCONTINUED] Fluticasone-Umeclidin-Vilant (TRELEGY ELLIPTA) 100-62.5-25 MCG/INH AEPB Inhale 1 puff into the lungs daily.   Facility-Administered Encounter Medications as of 07/22/2017  Medication  . [COMPLETED] levalbuterol (XOPENEX) nebulizer solution 0.63 mg  . sodium chloride flush (NS) 0.9 % injection 10 mL     Review of Systems  Constitutional:   No  weight loss, night sweats,  Fevers, chills, + fatigue, or   lassitude.  HEENT:   No headaches,  Difficulty swallowing,  Tooth/dental problems, or  Sore throat,                No sneezing, itching, ear ache, nasal congestion, post nasal drip,   CV:  No chest pain,  Orthopnea, PND, swelling in lower extremities, anasarca, dizziness, palpitations, syncope.   GI  No heartburn, indigestion, abdominal pain, nausea, vomiting, diarrhea, change in bowel habits, loss of appetite, bloody stools.   Resp:  .  No chest wall deformity  Skin: no rash or lesions.  GU: no dysuria, change in color of urine, no urgency or frequency.  No flank pain, no hematuria   MS:  No joint pain or swelling.  No decreased range  of motion.  No back pain.    Physical Exam  BP 116/74 (BP Location: Right Arm, Cuff Size: Normal)   Pulse 85   Temp (!) 97.5 F (36.4 C) (Oral)   Ht _0  (1.778 m)   Wt 221 lb 12.8 oz (100.6 kg)   SpO2 92%   BMI 31.82 kg/m   GEN: A/Ox3; pleasant , NAD, elderly on O2    HEENT:  [place]/AT,  EACs-clear, TMs-wnl, NOSE-clear, THROAT-clear, no lesions, no postnasal drip or exudate noted.   NECK:  Supple w/ fair ROM; no JVD; normal carotid impulses w/o bruits; no thyromegaly or nodules palpated; no lymphadenopathy.    RESP  Decreased BS in bases , . no accessory muscle use, no dullness to percussion  CARD:  RRR, no m/r/g, tr to 1+  peripheral edema, pulses intact, no cyanosis or clubbing.  GI:   Soft & nt; nml bowel sounds; no organomegaly or masses detected.   Musco: Warm bil, no deformities or joint swelling noted.   Neuro: alert, no focal deficits noted.    Skin: Warm, no lesions or rashes    Lab Results:  CBC    Component Value Date/Time   WBC 7.4 07/22/2017 1627   RBC 5.27 07/22/2017 1627   HGB 16.0 07/22/2017 1627   HGB 15.1 03/26/2017 1007   HCT 47.6 07/22/2017 1627   HCT 45.1 03/26/2017 1007   PLT 222.0 07/22/2017 1627   PLT 182 03/26/2017 1007   MCV 90.3 07/22/2017 1627   MCV 90 03/26/2017 1007   MCH 30.0 03/26/2017  1007   MCH 27.8 09/04/2015 1700   MCHC 33.6 07/22/2017 1627   RDW 14.9 07/22/2017 1627   RDW 14.9 03/26/2017 1007   LYMPHSABS 1.7 07/22/2017 1627   LYMPHSABS 1.8 03/26/2017 1007   MONOABS 0.7 07/22/2017 1627   EOSABS 0.1 07/22/2017 1627   EOSABS 0.1 03/26/2017 1007   BASOSABS 0.0 07/22/2017 1627   BASOSABS 0.0 03/26/2017 1007    BMET    Component Value Date/Time   NA 140 07/22/2017 1627   NA 139 03/26/2017 1007   NA 138 03/27/2016 1025   K 3.5 07/22/2017 1627   K 4.2 03/26/2017 1007   K 4.2 03/27/2016 1025   CL 99 07/22/2017 1627   CL 104 03/26/2017 1007   CO2 34 (H) 07/22/2017 1627   CO2 30 03/26/2017 1007   CO2 24 03/27/2016 1025   GLUCOSE 145 (H) 07/22/2017 1627   GLUCOSE 188 (H) 03/26/2017 1007   BUN 19 07/22/2017 1627   BUN 21 03/26/2017 1007   BUN 19.1 03/27/2016 1025   CREATININE 1.24 07/22/2017 1627   CREATININE 1.1 03/26/2017 1007   CREATININE 1.3 03/27/2016 1025   CALCIUM 8.9 07/22/2017 1627   CALCIUM 9.4 03/26/2017 1007   CALCIUM 8.7 03/27/2016 1025   GFRNONAA 40 (L) 09/04/2015 1700   GFRAA 46 (L) 09/04/2015 1700    BNP    Component Value Date/Time   BNP 135.4 (H) 09/04/2015 1700    ProBNP    Component Value Date/Time   PROBNP 122.0 (H) 07/22/2017 1627    Imaging: Dg Chest 2 View  Result Date: 07/22/2017 CLINICAL DATA:  84 year old male with increased shortness of breath for 1 week. Atrial fibrillation. Former smoker. EXAM: CHEST  2 VIEW COMPARISON:  Chest radiographs 10/07/2016. Chest CT 07/10/2016 and earlier. FINDINGS: Stable left chest subclavian approach porta cath. Small to moderate layering right pleural effusion has increased since 10/07/2016. Mildly lower lung volumes. No pneumothorax or pulmonary  edema. Stable bilateral increased pulmonary interstitial markings. No other confluent pulmonary opacity identified. Stable cardiac size and mediastinal contours. Visualized tracheal air column is within normal limits. No acute osseous abnormality  identified. Visible bowel gas pattern is within normal limits. IMPRESSION: 1. Increased right pleural effusion since 2018, now up to moderate size. 2. No other acute cardiopulmonary abnormality. Electronically Signed   By: Genevie Ann M.D.   On: 07/22/2017 16:29     Assessment & Plan:   CAP (community acquired pneumonia) Possible right-sided pneumonia with associated pleural effusion.  On the right Patient is clinically stable and is taking in fluids and food without difficulty.  He has home oxygen.  We will try to treat this on an outpatient basis with close follow-up.  Have advised patient and his daughter that if he has any symptoms that are worsening with shortness of breath or is unable to take in food of liquids that he will need to be admitted to the hospital.  He will return back in 1 week for a chest x-ray and close follow-up.  Patient will begin Augmentin for 7 days.  He is to use his oxygen on continuous flow at 3 L.  Plan  Patient Instructions  Augmentin 85m Twice daily  For 7 days , take with food.  Mucinex DM Twice daily  As needed  Cough/congestion  Extra lasix x 2 days .  Continue on oxygen 3l/m -continuous flow  Follow up with [place] NP or Dr. BLamonte Sakai In 1 week with chest xray .  Please contact office for sooner follow up if symptoms do not improve or worsen or seek emergency care       COPD with acute exacerbation ([place] Mild flare with associated pneumonia and effusion.  Xopenex nebulizer was given in the office.  Will hold on steroids at this time.  Patient is continue on her current regimen.  I may use in his albuterol nebulizer at home when available as needed.  Plan  Patient Instructions  Augmentin 879mTwice daily  For 7 days , take with food.  Mucinex DM Twice daily  As needed  Cough/congestion  Extra lasix x 2 days .  Continue on oxygen 3l/m -continuous flow  Follow up with Parrett NP or Dr. ByLamonte SakaiIn 1 week with chest xray .  Please contact office for sooner  follow up if symptoms do not improve or worsen or seek emergency care       Lung cancer Serial scans have been negative for recurrence.  If symptoms do not improve or effusion does not resolve.  Can consider possible thoracentesis and send for cytology if indicated.  Pleural effusion Patient has a chronic small pleural effusion on the right.  This is increased to moderate size with associated possible pneumonia plus or minus diastolic dysfunction.  Patient will take antibiotics as indicated.  Will hold on thoracentesis at this time.  Patient will also use extra Lasix for the next 2 days.  Lab work including be met and BNP are pending.  Plan  Patient Instructions  Augmentin 87574mwice daily  For 7 days , take with food.  Mucinex DM Twice daily  As needed  Cough/congestion  Extra lasix x 2 days .  Continue on oxygen 3l/m -continuous flow  Follow up with Parrett NP or Dr. ByrLamonte Sakain 1 week with chest xray .  Please contact office for sooner follow up if symptoms do not improve or worsen or seek emergency care  Rexene Edison, NP 07/22/2017

## 2017-07-23 ENCOUNTER — Telehealth: Payer: Self-pay | Admitting: Emergency Medicine

## 2017-07-23 ENCOUNTER — Telehealth: Payer: Self-pay | Admitting: Adult Health

## 2017-07-23 NOTE — Telephone Encounter (Signed)
Called spoke with patient's daughter Butch Penny that accompanied him to appt yesterday Per Butch Penny, pt felt better after neb treatment here in the office, pt did take his extra lasix yesterday, not yet today and has taken 2 doses of antibiotic.    Also advised of lab results as verbally given by TP: labs look okay. Continue recommendations from office visit and contact the office or seek emergency care of symptoms worsen.    Butch Penny voiced her understanding on labs results/recs Butch Penny also asking about neb machine and supplies that were ordered on 2.11.9 by RB.  Whenever she calls [facility] she is told they do not have an order.  Advised Butch Penny will call and investigate.  Called Melissa with [facility] - she can see the order but it has not been signed by RB, new protocol dictates that provider must sign the order before it can be processed.  TP already gone and RB is rounding in the hospital.  Will ask RB's nurse to text RB to cosign order.  Called Butch Penny, informed her of the above.  She would like this expedited once order is signed.  Will call [facility] to request this once done.  Will call Butch Penny back.

## 2017-07-23 NOTE — Telephone Encounter (Signed)
This order was not signed until yesterday and that is when it was sent to [facility] at 3:20

## 2017-07-23 NOTE — Telephone Encounter (Signed)
Forwarding to [facility] per protocol since the order was already placed

## 2017-07-23 NOTE — Telephone Encounter (Signed)
Spoke with Butch Penny, advised Rx for nebulizer should be there today. Nothing further is needed.

## 2017-07-25 NOTE — Telephone Encounter (Signed)
Had called Butch Penny on 2.13.19 at 5:30pm to let her know that I had spoken with Melissa and [facility] was going to try to get the neb machine out that night  LMOM TCB x1 for Butch Penny today to see how patient is doing and to make sure neb machine and everything were received.

## 2017-07-28 ENCOUNTER — Inpatient Hospital Stay: Payer: Medicare Other | Attending: Hematology & Oncology

## 2017-07-28 VITALS — BP 105/67 | HR 91 | Temp 97.5°F | Resp 20

## 2017-07-28 DIAGNOSIS — Z85118 Personal history of other malignant neoplasm of bronchus and lung: Secondary | ICD-10-CM | POA: Diagnosis present

## 2017-07-28 DIAGNOSIS — Z95828 Presence of other vascular implants and grafts: Secondary | ICD-10-CM

## 2017-07-28 DIAGNOSIS — Z452 Encounter for adjustment and management of vascular access device: Secondary | ICD-10-CM | POA: Insufficient documentation

## 2017-07-28 MED ORDER — SODIUM CHLORIDE 0.9% FLUSH
10.0000 mL | INTRAVENOUS | Status: DC | PRN
  Administered 2017-07-28: 10 mL via INTRAVENOUS
  Filled 2017-07-28: qty 10

## 2017-07-28 MED ORDER — HEPARIN SOD (PORK) LOCK FLUSH 100 UNIT/ML IV SOLN
500.0000 [IU] | Freq: Once | INTRAVENOUS | Status: AC
  Administered 2017-07-28: 500 [IU] via INTRAVENOUS
  Filled 2017-07-28: qty 5

## 2017-07-29 ENCOUNTER — Ambulatory Visit: Payer: Medicare Other | Admitting: Adult Health

## 2017-07-29 ENCOUNTER — Ambulatory Visit (INDEPENDENT_AMBULATORY_CARE_PROVIDER_SITE_OTHER)
Admission: RE | Admit: 2017-07-29 | Discharge: 2017-07-29 | Disposition: A | Discharge status: Home / Self Care | Payer: Medicare Other | Source: Ambulatory Visit | Attending: Adult Health | Admitting: Adult Health

## 2017-07-29 ENCOUNTER — Other Ambulatory Visit: Payer: Self-pay

## 2017-07-29 ENCOUNTER — Encounter: Payer: Self-pay | Admitting: Adult Health

## 2017-07-29 DIAGNOSIS — J90 Pleural effusion, not elsewhere classified: Secondary | ICD-10-CM

## 2017-07-29 DIAGNOSIS — J42 Unspecified chronic bronchitis: Secondary | ICD-10-CM | POA: Diagnosis not present

## 2017-07-29 NOTE — Telephone Encounter (Signed)
Patient seen today and doing much better Nothing further needed; will sign off

## 2017-07-29 NOTE — Assessment & Plan Note (Addendum)
COPD flare with Pleural effusion ? Recent bronchitis / volume overload  Clinically improved after augmentin and diuresis  Will hold off on thoracentesis for now  Watch closely   Plan  Patient Instructions  Continue on TRELEGY daily .  Continue on Oxygen 3l/m  Follow up with Dr. Lamonte Sakai  As planned next week and As needed   Please contact office for sooner follow up if symptoms do not improve or worsen or seek emergency care

## 2017-07-29 NOTE — Patient Instructions (Signed)
Continue on TRELEGY daily .  Continue on Oxygen 3l/m  Follow up with Dr. Lamonte Sakai  As planned next week and As needed   Please contact office for sooner follow up if symptoms do not improve or worsen or seek emergency care

## 2017-07-29 NOTE — Progress Notes (Signed)
@Patient  ID: [name], male    DOB: 11-08-1924, 84 y.o.   MRN: [id number]  No chief complaint on file.   Referring provider: Prince Solian, MD  HPI: 84 year old male former smoker followed for COPD, obstructive sleep apnea on CPAP.  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 Seen last ov with increased cough, congestion and dyspnea for last 3-4 weeks. CXR showed increased Bilateral Effusion R>L   Pt was started on Augmentin for 1 week and extra lasix for 2 days .  Since last ov pt says he is feeling better with decreased cough and dyspnea.  Feels he is little stronger. No fever, chest pain or edema.  CXR today shows stable small bilateral effusion .  Appetite is improved slightly    No Known Allergies  Immunization History  Administered Date(s) Administered  . Influenza Split 04/25/2011, 03/10/2012, 03/10/2013  . Influenza, High Dose Seasonal PF 03/28/2016  . Influenza,inj,Quad PF,6+ Mos 03/16/2014  . Influenza-Unspecified 05/09/2017  . Pneumococcal Conjugate-13 12/28/2015    Past Medical History:  Diagnosis Date  . Acute diastolic CHF (congestive heart failure) ([place])   . Allergic conjunctivitis   . Allergic rhinitis   . Atrial fibrillation ([place])   . BPH (benign prostatic hyperplasia)   . Cataract   . Cellulitis    hx  . COPD (chronic obstructive pulmonary disease) ([place])       . Diabetic peripheral neuropathy ([place])   . Diverticulosis    hx  . DJD (degenerative joint disease)    Left Knee  . DVT (deep venous thrombosis) ([place])    IN RIGHT ARM 11/2010   . Hearing loss   . Hyperlipidemia   . Hypertension   . Iron deficiency anemia 03/27/2016  . Iron deficiency anemia due to chronic blood loss 03/27/2016  . Lung cancer ([place])     history of stage IIIA non-small cell lung cancer who is currently being treated with both  Radiation and  Chemotherapy  . Malabsorption of iron 03/27/2016  . Obstructive sleep apnea (adult)  10/29/2012   This patient has mild obstructive sleep apnea, tested in March 2014 at [facility] sleep. He had been a CPAP user for 14 years. AHi 10.7 He was titrated to a pressure of 9 cm water( after auto - titration). CPAP at night  --- 8-10 YR AGO....,OSA-diagnosed 2000  . Pneumonia    history  . Secondary diabetes with peripheral neuropathy ([place]) 11/07/2012    Tobacco History: Social History   Tobacco Use  Smoking Status Former Smoker  . Packs/day: 1.00  . Years: 50.00  . Pack years: 50.00  . Types: Cigarettes  . Start date: 05/18/1943  . Last attempt to quit: 06/10/1992  . Years since quitting: 25.1  Smokeless Tobacco Never Used  Tobacco Comment   quit tobacco 3 years ago   Counseling given: Not Answered Comment: quit tobacco 3 years ago   Outpatient Encounter Medications as of 07/29/2017  Medication Sig  . albuterol (PROVENTIL) (2.5 MG/3ML) 0.083% nebulizer solution Take 3 mLs (2.5 mg total) by nebulization every 6 (six) hours as needed for wheezing or shortness of breath.  Marland Kitchen amiodarone (PACERONE) 200 MG tablet Take 200 mg by mouth every Monday, Wednesday, and Friday.  Marland Kitchen apixaban (ELIQUIS) 5 MG TABS tablet Take 1 tablet (5 mg total) by mouth 2 (two) times daily.  Marland Kitchen atorvastatin (  LIPITOR) 20 MG tablet Take 20 mg by mouth every morning.  . Calcium Carbonate-Vitamin D (CALCIUM 600+D) 600-400 MG-UNIT tablet Take 1 tablet by mouth daily.  . cetirizine (ZYRTEC) 10 MG tablet Take 10 mg by mouth daily.   Marland Kitchen diltiazem (CARDIZEM CD) 120 MG 24 hr capsule Take 120 mg by mouth daily.  . Fluticasone-Umeclidin-Vilant (TRELEGY ELLIPTA) 100-62.5-25 MCG/INH AEPB Inhale 1 puff into the lungs daily.  . furosemide (LASIX) 40 MG tablet Take 1 tablet (40 mg total) by mouth 2 (two) times daily.  Marland Kitchen glucosamine-chondroitin 500-400 MG tablet Take 1 tablet by mouth 2 (two) times daily.   . Insulin Detemir (LEVEMIR  FLEXTOUCH) 100 UNIT/ML Pen Inject into the skin 2 (two) times daily as needed (CBG >100). 02/14/2016 If CBG >100 inject 20 units subcutaneously with breakfast and 16 units with supper.  . insulin lispro (HUMALOG) 100 UNIT/ML injection Inject 5 Units into the skin 3 (three) times daily before meals.  Marland Kitchen levothyroxine (SYNTHROID, LEVOTHROID) 25 MCG tablet Take 25 mcg by mouth daily before breakfast.  . metFORMIN (GLUCOPHAGE-XR) 500 MG 24 hr tablet Take 500 mg by mouth 2 (two) times daily after a meal.   . Multiple Vitamin (MULTIVITAMIN) tablet Take 1 tablet by mouth daily.  Glory Rosebush VERIO test strip Inject 1 each into the skin every morning.  Marland Kitchen oxybutynin (DITROPAN) 5 MG tablet Take 5 mg by mouth 2 (two) times daily.  Marland Kitchen PROAIR HFA 108 (90 Base) MCG/ACT inhaler INHALE TWO PUFFS BY MOUTH EVERY 6 HOURS AS NEEDED FOR WHEEZING AND FOR SHORTNESS OF BREATH  . temazepam (RESTORIL) 15 MG capsule TAKE ONE CAPSULE BY MOUTH AT BEDTIME AS NEEDED FOR SLEEP  . [DISCONTINUED] amoxicillin-clavulanate (AUGMENTIN) 875-125 MG tablet Take 1 tablet by mouth 2 (two) times daily for 7 days. (Patient not taking: Reported on 07/29/2017)   Facility-Administered Encounter Medications as of 07/29/2017  Medication  . sodium chloride flush (NS) 0.9 % injection 10 mL     Review of Systems  Constitutional:   No  weight loss, night sweats,  Fevers, chills,  +fatigue, or  lassitude.  HEENT:   No headaches,  Difficulty swallowing,  Tooth/dental problems, or  Sore throat,                No sneezing, itching, ear ache, nasal congestion, post nasal drip,   CV:  No chest pain,  Orthopnea, PND, swelling in lower extremities, anasarca, dizziness, palpitations, syncope.   GI  No heartburn, indigestion, abdominal pain, nausea, vomiting, diarrhea, change in bowel habits, loss of appetite, bloody stools.   Resp:    No chest wall deformity  Skin: no rash or lesions.  GU: no dysuria, change in color of urine, no urgency or frequency.   No flank pain, no hematuria   MS:  No joint pain or swelling.  No decreased range of motion.  No back pain.    Physical Exam  BP 104/62 (BP Location: Left Arm, Cuff Size: Normal)   Pulse 61   Ht 5\' 10"  (1.778 m)   Wt 217 lb 6.4 oz (98.6 kg)   SpO2 92%   BMI 31.19 kg/m   GEN: A/Ox3; pleasant , NAD , frail    HEENT:  [place]/AT,  EACs-clear, TMs-wnl, NOSE-clear, THROAT-clear, no lesions, no postnasal drip or exudate noted.   NECK:  Supple w/ fair ROM; no JVD; normal carotid impulses w/o bruits; no thyromegaly or nodules palpated; no lymphadenopathy.    RESP  Decreased BS in bases , no  accessory muscle use, no dullness to percussion  CARD:  RRR, no m/r/g, no peripheral edema, pulses intact, no cyanosis or clubbing.  GI:   Soft & nt; nml bowel sounds; no organomegaly or masses detected.   Musco: Warm bil, no deformities or joint swelling noted.   Neuro: alert, no focal deficits noted.    Skin: Warm, no lesions or rashes    Lab Results:  CBC BMET  ProBNP    Component Value Date/Time   PROBNP 122.0 (H) 07/22/2017 1627    Imaging: Dg Chest 2 View  Result Date: 07/29/2017 CLINICAL DATA:  Followup pleural effusion. EXAM: CHEST  2 VIEW COMPARISON:  07/22/2017 FINDINGS: The cardiac silhouette, mediastinal and hilar contours are stable. There is marked tortuosity, ectasia and calcification of the thoracic aorta. Chronic lung changes and small persistent bilateral pleural effusions, right greater than left. Minimal overlying atelectasis. IMPRESSION: Chronic lung changes and persistent effusions. Electronically Signed   By: Marijo Sanes M.D.   On: 07/29/2017 10:23   Dg Chest 2 View  Result Date: 07/22/2017 CLINICAL DATA:  84 year old male with increased shortness of breath for 1 week. Atrial fibrillation. Former smoker. EXAM: CHEST  2 VIEW COMPARISON:  Chest radiographs 10/07/2016. Chest CT 07/10/2016 and earlier. FINDINGS: Stable left chest subclavian approach porta cath. Small  to moderate layering right pleural effusion has increased since 10/07/2016. Mildly lower lung volumes. No pneumothorax or pulmonary edema. Stable bilateral increased pulmonary interstitial markings. No other confluent pulmonary opacity identified. Stable cardiac size and mediastinal contours. Visualized tracheal air column is within normal limits. No acute osseous abnormality identified. Visible bowel gas pattern is within normal limits. IMPRESSION: 1. Increased right pleural effusion since 2018, now up to moderate size. 2. No other acute cardiopulmonary abnormality. Electronically Signed   By: Genevie Ann M.D.   On: 07/22/2017 16:29     Assessment & Plan:   COPD (chronic obstructive pulmonary disease) (HCC) COPD flare with Pleural effusion ? Recent bronchitis / volume overload  Clinically improved after augmentin and diuresis  Will hold off on thoracentesis for now  Watch closely   Plan  Patient Instructions  Continue on TRELEGY daily .  Continue on Oxygen 3l/m  Follow up with Dr. Lamonte Sakai  As planned next week and As needed   Please contact office for sooner follow up if symptoms do not improve or worsen or seek emergency care       Pleural effusion Clinically stable , small right pleural effusion   Plan  Patient Instructions  Continue on TRELEGY daily .  Continue on Oxygen 3l/m  Follow up with Dr. Lamonte Sakai  As planned next week and As needed   Please contact office for sooner follow up if symptoms do not improve or worsen or seek emergency care          Rexene Edison, NP 07/29/2017

## 2017-07-29 NOTE — Assessment & Plan Note (Signed)
Clinically stable , small right pleural effusion   Plan  Patient Instructions  Continue on TRELEGY daily .  Continue on Oxygen 3l/m  Follow up with Dr. Lamonte Sakai  As planned next week and As needed   Please contact office for sooner follow up if symptoms do not improve or worsen or seek emergency care

## 2017-08-05 ENCOUNTER — Ambulatory Visit: Payer: Medicare Other | Admitting: Emergency Medicine

## 2017-08-06 ENCOUNTER — Telehealth: Payer: Self-pay | Admitting: Adult Health

## 2017-08-06 NOTE — Telephone Encounter (Signed)
Noted. Will route message to TP to make her aware.

## 2017-08-26 ENCOUNTER — Encounter: Payer: Self-pay | Admitting: Emergency Medicine

## 2017-08-26 ENCOUNTER — Encounter (INDEPENDENT_AMBULATORY_CARE_PROVIDER_SITE_OTHER): Payer: Self-pay

## 2017-08-26 ENCOUNTER — Ambulatory Visit: Payer: Medicare Other | Admitting: Emergency Medicine

## 2017-08-26 DIAGNOSIS — I159 Secondary hypertension, unspecified: Secondary | ICD-10-CM

## 2017-08-26 DIAGNOSIS — Z9989 Dependence on other enabling machines and devices: Secondary | ICD-10-CM | POA: Diagnosis not present

## 2017-08-26 DIAGNOSIS — G4733 Obstructive sleep apnea (adult) (pediatric): Secondary | ICD-10-CM | POA: Diagnosis not present

## 2017-08-26 DIAGNOSIS — J42 Unspecified chronic bronchitis: Secondary | ICD-10-CM

## 2017-08-26 DIAGNOSIS — R0902 Hypoxemia: Secondary | ICD-10-CM

## 2017-08-26 NOTE — Assessment & Plan Note (Signed)
Continue CPAP every night as he is doing

## 2017-08-26 NOTE — Assessment & Plan Note (Signed)
When you are using your continuous oxygen please use 3 L/min at rest and increase to 4 L/min with exertion. When you are using your pulsed oxygen system please use 3 L/min at rest and increased to 5 L/min pulsed with exertion.

## 2017-08-26 NOTE — Progress Notes (Signed)
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 Was admitted 5/31 with acute dyspnea, found to be in A fib + RVR. Started on amiodarone. Converted to NSR 11/11/12. Scheduled for repeat Ct scan chest (Dr Katheran Awe) for surveillance on 11/25/12. During his hospitalization, we started the eval for his presumed COPD.  He has been on Spiriva before, SABA before, didn't notice any benefit.   ROV 08/14/15 -- follow-up visit for history of COPD, OSA on CPAP. He also has a history of adenocarcinoma of the lung followed by Dr Katheran Awe, treated with chemoradiation. He has not any evidence clinically of recurrence.  He was admitted end of 11/16 with CAP and Ae-COPD. No CT chest in several years, CXR without evidence recurrence in December. He is not on scheduled BD, didn't respond to Spiriva.   ROV 03/07/16 -- patient has a history of COPD, obstructive sleep apnea and non-small cell lung cancer (adenocarcinoma) that was treated with chemoradiation. Most recent CT scan of the chest was reassuring for no recurrence of disease in July 2017. He was seen here earlier this month with significant wheezing, progressive dyspnea. He had not been on scheduled bronchodilators and these were retried as he started Anoro. He continues to have severe exertional SOB. He is not clear that the Anoro has helped him any. He continues to have wheeze. He has had some chills over the last 2 days.   ROV 04/09/16 -- this is a follow-up visit for history of COPD, obstructive sleep apnea on CPAP, non-small cell lung cancer treated with chemoradiation. I saw him about a month ago with an acute exacerbation in the setting of community-acquired versus aspiration pneumonia. He was treated with antibiotics, steroids. He is improved, probably back to baseline. O2 at 2-3L/min. He is on Anoro, is unclear whether it has helped him much. He uses albuterol about 2-3x a  day. He is on zyrtec. Flu shot up to date. He has very good compliance with his CPAP.   ROV 10/07/16 -- patient follows up today for history of COPD, objective sleep apnea on CPAP, non-small cell lung cancer of the right middle lobe treated with chemotherapy and radiation. Also with a remote history of DVT, hypertension with diastolic CHF. He has been managed on Anoro - he is very reliable w his CPAP, compliance report shows 100% usage > 4h. Good clinical benefit. He is concerned that the Anoro is not helping him. He states that his exercise tolerance has decreased compared with last visit. Notes it w walking. No CP, tightness. No wt change. He uses lasix prn for edema, has been using a bit more lately. His iron level has been low - just received Fe infusion 1 weeks ago. He states tha this A fib has been very labile - tachy / brady/   ROV 12/30/16 -- patient has a history of COPD, obstructive sleep apnea on CPAP, non-small cell lung cancer of the right middle lobe (chemotherapy, radiation). He also has a remote history of DVT, hypertension with diastolic CHF. He has been managed with Anoro and we have worked on getting patient assistance for him for the cost. He reports that he stopped Anoro for a brief period - missed it so he restarted it. He has some difficulty w secretions, thick. May be a bit better since last time. He uses albuterol about 0-1x a day. Remains on zyrtec, O2 at 2-3L/min. Dr  Enniver following with serial CXR's, next in October.    ROV 05/06/17 --this is a follow-up visit for 84 year old gentleman with COPD, obstructive sleep apnea on CPAP and right middle lobe non-small cell lung cancer status post chemotherapy and radiation therapy.  Also with a history of hypertension, diastolic CHF, remote DVT.  He was due to have a repeat CT scan of his chest in October although I do not see that this has been done. He had a CXR in April. He had been doing well until about 1 week ago he developed dyspnea,  chest tightness after he did yard work. He had to start pred and abx > feeling a lot better. He has been on Anoro, was just changed to Trelegy and is tolerating. He tells me that he averages about 3-4 AE's a year.   ROV 08/26/17 --84 year old gentleman with a history of COPD, obstructive sleep apnea on CPAP, right middle lobe non-small cell lung cancer that was treated with chemotherapy and radiation therapy.  He has hypertension with diastolic dysfunction, history of remote DVT.  He was seen in our office by T Parrett twice in February for progressive dyspnea, cough, congestion.  A chest x-ray showed bilateral effusions greater on the right than on the left.  He received Augmentin and extra diuretics.   Pleural effusions on repeat chest x-ray 07/29/17 were for the most part unchanged.  Thoracentesis was deferred. He tells me that he feels better - was experiencing flu-like sx that have resolved. His breathing really didn't change much, has had some slow progression of dyspnea. He is on Trelegy. Uses albuterol with some relief of wheeze but doesn;t really change his breathing.    Vitals:   08/26/17 1409  BP: 112/60  Pulse: 76  SpO2: 91%  Weight: 222 lb (100.7 kg)  Height: 5\' 10"  (1.778 m)   Gen: Pleasant, elderly man, in no distress,  normal affect  ENT: No lesions,  mouth clear,  oropharynx clear, no postnasal drip  Neck: No JVD, no stridor  Lungs: No use of accessory muscles, clear bilaterally  Cardiovascular: RRR, heart sounds normal, no murmur or gallops, 1+ lower extremity ankle edema  Musculoskeletal: No deformities, no cyanosis or clubbing  Neuro: alert, non focal  Skin: Warm, no lesions or rashes    OSA on CPAP Continue CPAP every night as he is doing  COPD (chronic obstructive pulmonary disease) (HCC) Please continue Trelegy once a day.  Remember to rinse and gargle after using this medication. Please use albuterol if needed for wheezing, shortness of breath Follow with Dr  Lamonte Sakai in 3 months or sooner if you have any problems.  Hypertension With diastolic dysfunction.  He may benefit from increasing his diuretics.  Of asked him to touch base with Dr. Wynonia Lawman regarding appropriate dosing.  Hypoxemia When you are using your continuous oxygen please use 3 L/min at rest and increase to 4 L/min with exertion. When you are using your pulsed oxygen system please use 3 L/min at rest and increased to 5 L/min pulsed with exertion.   [name], MD, PhD 08/26/2017, 2:37 PM [facility] [phone] or if no answer [phone]

## 2017-08-26 NOTE — Assessment & Plan Note (Signed)
Please continue Trelegy once a day.  Remember to rinse and gargle after using this medication. Please use albuterol if needed for wheezing, shortness of breath Follow with Dr Lamonte Sakai in 3 months or sooner if you have any problems.

## 2017-08-26 NOTE — Assessment & Plan Note (Signed)
With diastolic dysfunction.  He may benefit from increasing his diuretics.  Of asked him to touch base with Dr. Wynonia Lawman regarding appropriate dosing.

## 2017-08-26 NOTE — Patient Instructions (Addendum)
Please continue your CPAP every night When you are using your continuous oxygen please use 3 L/min at rest and increase to 4 L/min with exertion. When you are using your pulsed oxygen system please use 3 L/min at rest and increased to 5 L/min pulsed with exertion. Please continue Trelegy once a day.  Remember to rinse and gargle after using this medication. Please use albuterol if needed for wheezing, shortness of breath Speak with Dr. Wynonia Lawman about whether there is any benefit to adjusting your Lasix dosing. Follow with Dr Lamonte Sakai in 3 months or sooner if you have any problems.

## 2017-09-13 ENCOUNTER — Telehealth: Payer: Self-pay | Admitting: Pulmonary Disease

## 2017-09-13 ENCOUNTER — Encounter (HOSPITAL_COMMUNITY): Payer: Self-pay | Admitting: Emergency Medicine

## 2017-09-13 ENCOUNTER — Other Ambulatory Visit: Payer: Self-pay

## 2017-09-13 ENCOUNTER — Inpatient Hospital Stay (HOSPITAL_COMMUNITY)
Admission: EM | Admit: 2017-09-13 | Discharge: 2017-09-16 | DRG: 190 | Disposition: A | Discharge status: Home / Self Care | Payer: Medicare Other | Attending: Internal Medicine | Admitting: Internal Medicine

## 2017-09-13 ENCOUNTER — Emergency Department (HOSPITAL_COMMUNITY): Payer: Medicare Other

## 2017-09-13 DIAGNOSIS — Z87891 Personal history of nicotine dependence: Secondary | ICD-10-CM

## 2017-09-13 DIAGNOSIS — C3401 Malignant neoplasm of right main bronchus: Secondary | ICD-10-CM | POA: Diagnosis present

## 2017-09-13 DIAGNOSIS — J441 Chronic obstructive pulmonary disease with (acute) exacerbation: Secondary | ICD-10-CM | POA: Diagnosis not present

## 2017-09-13 DIAGNOSIS — Z9221 Personal history of antineoplastic chemotherapy: Secondary | ICD-10-CM

## 2017-09-13 DIAGNOSIS — Z7901 Long term (current) use of anticoagulants: Secondary | ICD-10-CM

## 2017-09-13 DIAGNOSIS — I480 Paroxysmal atrial fibrillation: Secondary | ICD-10-CM | POA: Diagnosis present

## 2017-09-13 DIAGNOSIS — I110 Hypertensive heart disease with heart failure: Secondary | ICD-10-CM | POA: Diagnosis present

## 2017-09-13 DIAGNOSIS — I5032 Chronic diastolic (congestive) heart failure: Secondary | ICD-10-CM | POA: Diagnosis present

## 2017-09-13 DIAGNOSIS — N400 Enlarged prostate without lower urinary tract symptoms: Secondary | ICD-10-CM | POA: Diagnosis present

## 2017-09-13 DIAGNOSIS — Z923 Personal history of irradiation: Secondary | ICD-10-CM

## 2017-09-13 DIAGNOSIS — Z7951 Long term (current) use of inhaled steroids: Secondary | ICD-10-CM

## 2017-09-13 DIAGNOSIS — T380X5A Adverse effect of glucocorticoids and synthetic analogues, initial encounter: Secondary | ICD-10-CM | POA: Diagnosis present

## 2017-09-13 DIAGNOSIS — Z794 Long term (current) use of insulin: Secondary | ICD-10-CM

## 2017-09-13 DIAGNOSIS — Z85118 Personal history of other malignant neoplasm of bronchus and lung: Secondary | ICD-10-CM

## 2017-09-13 DIAGNOSIS — E1165 Type 2 diabetes mellitus with hyperglycemia: Secondary | ICD-10-CM | POA: Diagnosis present

## 2017-09-13 DIAGNOSIS — Z9981 Dependence on supplemental oxygen: Secondary | ICD-10-CM

## 2017-09-13 DIAGNOSIS — Z9989 Dependence on other enabling machines and devices: Secondary | ICD-10-CM

## 2017-09-13 DIAGNOSIS — E1129 Type 2 diabetes mellitus with other diabetic kidney complication: Secondary | ICD-10-CM

## 2017-09-13 DIAGNOSIS — E1142 Type 2 diabetes mellitus with diabetic polyneuropathy: Secondary | ICD-10-CM | POA: Diagnosis present

## 2017-09-13 DIAGNOSIS — J9621 Acute and chronic respiratory failure with hypoxia: Secondary | ICD-10-CM | POA: Diagnosis present

## 2017-09-13 DIAGNOSIS — E785 Hyperlipidemia, unspecified: Secondary | ICD-10-CM | POA: Diagnosis present

## 2017-09-13 DIAGNOSIS — J9611 Chronic respiratory failure with hypoxia: Secondary | ICD-10-CM | POA: Diagnosis present

## 2017-09-13 DIAGNOSIS — I10 Essential (primary) hypertension: Secondary | ICD-10-CM | POA: Diagnosis present

## 2017-09-13 DIAGNOSIS — G4733 Obstructive sleep apnea (adult) (pediatric): Secondary | ICD-10-CM

## 2017-09-13 DIAGNOSIS — E039 Hypothyroidism, unspecified: Secondary | ICD-10-CM | POA: Diagnosis present

## 2017-09-13 LAB — CBC
HEMATOCRIT: 44.1 % (ref 39.0–52.0)
HEMOGLOBIN: 14.8 g/dL (ref 13.0–17.0)
MCH: 30.3 pg (ref 26.0–34.0)
MCHC: 33.6 g/dL (ref 30.0–36.0)
MCV: 90.4 fL (ref 78.0–100.0)
Platelets: 195 10*3/uL (ref 150–400)
RBC: 4.88 MIL/uL (ref 4.22–5.81)
RDW: 13.8 % (ref 11.5–15.5)
WBC: 11.9 10*3/uL — AB (ref 4.0–10.5)

## 2017-09-13 LAB — BASIC METABOLIC PANEL
ANION GAP: 12 (ref 5–15)
BUN: 21 mg/dL — ABNORMAL HIGH (ref 6–20)
CHLORIDE: 98 mmol/L — AB (ref 101–111)
CO2: 26 mmol/L (ref 22–32)
Calcium: 8.8 mg/dL — ABNORMAL LOW (ref 8.9–10.3)
Creatinine, Ser: 1.29 mg/dL — ABNORMAL HIGH (ref 0.61–1.24)
GFR calc Af Amer: 54 mL/min — ABNORMAL LOW (ref >60)
GFR calc non Af Amer: 47 mL/min — ABNORMAL LOW (ref >60)
Glucose, Bld: 202 mg/dL — ABNORMAL HIGH (ref 65–99)
POTASSIUM: 4.1 mmol/L (ref 3.5–5.1)
Sodium: 136 mmol/L (ref 135–145)

## 2017-09-13 MED ORDER — DOXYCYCLINE HYCLATE 100 MG PO TABS: 100.0000 mg | ORAL_TABLET | Freq: Two times a day (BID) | ORAL | 0 refills | Status: DC

## 2017-09-13 MED ORDER — IPRATROPIUM-ALBUTEROL 0.5-2.5 (3) MG/3ML IN SOLN
3.0000 mL | Freq: Once | RESPIRATORY_TRACT | Status: AC
  Administered 2017-09-13: 3 mL via RESPIRATORY_TRACT
  Filled 2017-09-13: qty 3

## 2017-09-13 MED ORDER — ALBUTEROL SULFATE (2.5 MG/3ML) 0.083% IN NEBU
2.5000 mg | INHALATION_SOLUTION | RESPIRATORY_TRACT | Status: DC | PRN
  Administered 2017-09-15: 2.5 mg via RESPIRATORY_TRACT
  Filled 2017-09-13: qty 3

## 2017-09-13 MED ORDER — ALBUTEROL SULFATE (2.5 MG/3ML) 0.083% IN NEBU
5.0000 mg | INHALATION_SOLUTION | Freq: Once | RESPIRATORY_TRACT | Status: AC
  Administered 2017-09-13: 5 mg via RESPIRATORY_TRACT
  Filled 2017-09-13: qty 6

## 2017-09-13 MED ORDER — LEVALBUTEROL HCL 0.63 MG/3ML IN NEBU: 0.6300 mg | INHALATION_SOLUTION | Freq: Three times a day (TID) | RESPIRATORY_TRACT | Status: DC

## 2017-09-13 MED ORDER — PREDNISONE 10 MG PO TABS: ORAL_TABLET | ORAL | 0 refills | Status: DC

## 2017-09-13 NOTE — ED Notes (Signed)
ED Provider at bedside. 

## 2017-09-13 NOTE — Telephone Encounter (Signed)
Called by [name] daughter who states that her dad has had increasing SOB over the course of the day. Sat now = 78% in his usual 3 L/min [place] O2. I advised her to bring [name] to the Emergency Department for further evaluation and possible hospital admission.

## 2017-09-13 NOTE — ED Notes (Signed)
Patient trying to remove pants, became dyspneic and oxygen decreased to 84%.  Patient had taken oxygen off during this, patient placed back on oxygen and recovered well to 94%.

## 2017-09-13 NOTE — ED Notes (Signed)
pharmacy tec bedside

## 2017-09-13 NOTE — ED Provider Notes (Signed)
[facility] EMERGENCY DEPARTMENT Provider Note   CSN: [id number] Arrival date & time: 09/13/17  2015     History   Chief Complaint Chief Complaint  Patient presents with  . Shortness of Breath    HPI SANCHEZ HEMMER is a 84 y.o. male.  The history is provided by the patient. No language interpreter was used.  Shortness of Breath  This is a new problem. The average episode lasts 2 days. The problem occurs continuously.The current episode started 2 days ago. The problem has been gradually worsening. Pertinent negatives include no fever. It is unknown what precipitated the problem. He has tried nothing for the symptoms. The treatment provided no relief. Associated medical issues do not include pneumonia.  Pt complains of shortness of breath.  Pt reports he is on 3 liters of 02 at home.  Pt reports he has COPD. Pt reports he has felt more short of breath for 2 days  Past Medical History:  Diagnosis Date  . Acute diastolic CHF (congestive heart failure) ([place])   . Allergic conjunctivitis   . Allergic rhinitis   . Atrial fibrillation ([place])   . BPH (benign prostatic hyperplasia)   . Cataract   . Cellulitis    hx  . COPD (chronic obstructive pulmonary disease) ([place])       . Diabetic peripheral neuropathy ([place])   . Diverticulosis    hx  . DJD (degenerative joint disease)    Left Knee  . DVT (deep venous thrombosis) ([place])    IN RIGHT ARM 11/2010   . Hearing loss   . Hyperlipidemia   . Hypertension   . Iron deficiency anemia 03/27/2016  . Iron deficiency anemia due to chronic blood loss 03/27/2016  . Lung cancer ([place])     history of stage IIIA non-small cell lung cancer who is currently being treated with both  Radiation and Chemotherapy  . Malabsorption of iron 03/27/2016  . Obstructive sleep apnea (adult)  10/29/2012   This patient has mild obstructive sleep apnea, tested in March 2014 at [facility] sleep. He had been a CPAP user for 14 years. AHi 10.7 He was  titrated to a pressure of 9 cm water( after auto - titration). CPAP at night  --- 8-10 YR AGO....,OSA-diagnosed 2000  . Pneumonia    history  . Secondary diabetes with peripheral neuropathy ([place]) 11/07/2012    Patient Active Problem List   Diagnosis Date Noted  . Pleural effusion 07/22/2017  . Malignant neoplasm of hilus of right lung ([place]) 09/25/2016  . COPD with acute exacerbation ([place]) 07/04/2016  . Iron deficiency anemia 03/27/2016  . Malabsorption of iron 03/27/2016  . Iron deficiency anemia due to chronic blood loss 03/27/2016  . Loss of weight 02/19/2016  . Type 2 diabetes mellitus ([place]) 05/08/2015  . CAP (community acquired pneumonia) 05/07/2015  . Lactic acidosis 05/07/2015  . OSA on CPAP 01/03/2015  . Hypoxemia 12/31/2013  . Long-term (current) use of anticoagulants 11/12/2012  . History of DVT of right axillary vein 11/07/2012  . Secondary diabetes with peripheral neuropathy ([place]) 11/07/2012  . Hypertension 11/07/2012  . Hyperlipidemia 11/07/2012  . BPH (benign prostatic hypertrophy) 11/07/2012  . Paroxysmal atrial fibrillation ([place]) 11/07/2012  . Diverticulosis   . Lung cancer ([place])   . Diabetic peripheral neuropathy ([place])   . COPD (chronic obstructive pulmonary disease) ([place])   . Allergic rhinitis     Past Surgical History:  Procedure Laterality Date  . CARDIAC CATHETERIZATION  2006  .  deviated septum repair    . FIBEROPTIC BRONCHOSCOPY  12/11/2010  . Insertion of a left subclavian Port-A-Cath.  12/17/10   [place]  . PORTACATH PLACEMENT  04/23/2011   Procedure: INSERTION PORT-A-CATH;  Surgeon: Pierre Bali, MD;  Location: [facility];  Service: Thoracic;;  Revision of  Porta-Cath  . VIDEO BRONCHOSCOPY  09/27/2011   Procedure: VIDEO BRONCHOSCOPY;  Surgeon: Nicanor Alcon, MD;  Location: [facility] OR;  Service: Thoracic;  Laterality: N/A;        Home Medications    Prior to Admission medications   Medication Sig Start Date End Date Taking? Authorizing Provider    albuterol (PROVENTIL) (2.5 MG/3ML) 0.083% nebulizer solution Take 3 mLs (2.5 mg total) by nebulization every 6 (six) hours as needed for wheezing or shortness of breath. 07/22/17  Yes Collene Gobble, MD  amiodarone (PACERONE) 200 MG tablet Take 200 mg by mouth See admin instructions. 200mg  on M W F, may take 200mg  as needed for palpitations.   Yes [provider]  apixaban (ELIQUIS) 5 MG TABS tablet Take 1 tablet (5 mg total) by mouth 2 (two) times daily. 11/12/12  Yes Jacolyn Reedy, MD  atorvastatin (LIPITOR) 20 MG tablet Take 20 mg by mouth daily at 6 PM.  03/05/17  Yes [provider]  cetirizine (ZYRTEC) 10 MG tablet Take 10 mg by mouth daily.    Yes [provider]  diltiazem (CARDIZEM CD) 120 MG 24 hr capsule Take 120 mg by mouth daily.   Yes [provider]  Fluticasone-Umeclidin-Vilant (TRELEGY ELLIPTA) 100-62.5-25 MCG/INH AEPB Inhale 1 puff into the lungs daily. 05/13/17  Yes Collene Gobble, MD  furosemide (LASIX) 40 MG tablet Take 1 tablet (40 mg total) by mouth 2 (two) times daily. Patient taking differently: Take 40 mg by mouth See admin instructions. 40mg  in the morning and 40mg  at lunch time 05/11/15  Yes Thurnell Lose, MD  glucosamine-chondroitin 500-400 MG tablet Take 1 tablet by mouth 2 (two) times daily.    Yes [provider]  Insulin Detemir (LEVEMIR FLEXTOUCH) 100 UNIT/ML Pen Inject into the skin 2 (two) times daily as needed (CBG >100). 02/14/2016 If CBG >100 inject 20 units subcutaneously with breakfast and 16 units with supper.   Yes [provider]  insulin lispro (HUMALOG) 100 UNIT/ML injection Inject 5 Units into the skin 3 (three) times daily as needed for high blood sugar.    Yes [provider]  levothyroxine (SYNTHROID, LEVOTHROID) 25 MCG tablet Take 25 mcg by mouth daily before breakfast.   Yes [provider]  metFORMIN (GLUCOPHAGE-XR) 500 MG 24 hr tablet Take 500 mg by mouth 2 (two) times daily  after a meal.  07/25/16  Yes [provider]  oxybutynin (DITROPAN) 5 MG tablet Take 5 mg by mouth at bedtime.    Yes [provider]  PROAIR HFA 108 (90 Base) MCG/ACT inhaler INHALE TWO PUFFS BY MOUTH EVERY 6 HOURS AS NEEDED FOR WHEEZING AND FOR SHORTNESS OF BREATH 04/10/17  Yes Byrum, Rose Fillers, MD  temazepam (RESTORIL) 15 MG capsule TAKE ONE CAPSULE BY MOUTH AT BEDTIME AS NEEDED FOR SLEEP Patient taking differently: Take one capsule (15mg ) by mouth at bedtime. 02/02/14  Yes Volanda Napoleon, MD  doxycycline (VIBRA-TABS) 100 MG tablet Take 1 tablet (100 mg total) by mouth 2 (two) times daily. Patient not taking: Reported on 09/13/2017 09/13/17   Chesley Mires, MD    Family History Family History  Problem Relation Age of  Onset  . Coronary artery disease Unknown   . Cancer Unknown   . Multiple sclerosis Unknown   . Diabetes type II Unknown   . Anesthesia problems Neg Hx   . Hypotension Neg Hx   . Malignant hyperthermia Neg Hx   . Pseudochol deficiency Neg Hx     Social History Social History   Tobacco Use  . Smoking status: Former Smoker    Packs/day: 1.00    Years: 50.00    Pack years: 50.00    Types: Cigarettes    Start date: 05/18/1943    Last attempt to quit: 06/10/1992    Years since quitting: 25.2  . Smokeless tobacco: Never Used  . Tobacco comment: quit tobacco 3 years ago  Substance Use Topics  . Alcohol use: No    Alcohol/week: 0.0 oz  . Drug use: No     Allergies   Patient has no known allergies.   Review of Systems Review of Systems  Constitutional: Negative for fever.  Respiratory: Positive for shortness of breath.   All other systems reviewed and are negative.    Physical Exam Updated Vital Signs BP 102/64   Pulse 73   Temp 97.7 F (36.5 C) (Oral)   Resp 19   Ht 5\' 10"  (1.778 m)   Wt 100.7 kg (222 lb)   SpO2 (!) 89%   BMI 31.85 kg/m   Physical Exam  Constitutional: He appears well-developed and well-nourished.  HENT:  Head:  Normocephalic and atraumatic.  Mouth/Throat: Oropharynx is clear and moist.  Eyes: Pupils are equal, round, and reactive to light. Conjunctivae and EOM are normal.  Neck: Normal range of motion. Neck supple.  Cardiovascular: Normal rate and regular rhythm.  No murmur heard. Pulmonary/Chest: Effort normal. No respiratory distress. He has decreased breath sounds.  Wheezing and rhonchi,   Abdominal: Soft. There is no tenderness.  Musculoskeletal: Normal range of motion. He exhibits no edema.       Right lower leg: Normal.  Neurological: He is alert.  Skin: Skin is warm and dry.  Psychiatric: He has a normal mood and affect.  Nursing note and vitals reviewed.    ED Treatments / Results  Labs (all labs ordered are listed, but only abnormal results are displayed) Labs Reviewed  CBC - Abnormal; Notable for the following components:      Result Value   WBC 11.9 (*)    All other components within normal limits  BASIC METABOLIC PANEL - Abnormal; Notable for the following components:   Chloride 98 (*)    Glucose, Bld 202 (*)    BUN 21 (*)    Creatinine, Ser 1.29 (*)    Calcium 8.8 (*)    GFR calc non Af Amer 47 (*)    GFR calc Af Amer 54 (*)    All other components within normal limits    EKG EKG Interpretation  Date/Time:  Saturday September 13 2017 20:19:18 EDT Ventricular Rate:  73 PR Interval:  194 QRS Duration: 106 QT Interval:  378 QTC Calculation: 416 R Axis:   72 Text Interpretation:  Normal sinus rhythm Normal ECG similar to prior 3/17 Confirmed by Aletta Edouard [id number]) on 09/13/2017 10:30:31 PM   Radiology Dg Chest 2 View  Result Date: 09/13/2017 CLINICAL DATA:  More shortness of breath than usual today with productive cough. Home oxygen use. History of lung cancer, pneumonia, COPD, atrial fibrillation, former smoker. EXAM: CHEST - 2 VIEW COMPARISON:  07/29/2017 FINDINGS: Left central venous catheter with  tip over the low cavoatrial junction region. No pneumothorax.  Heart size and pulmonary vascularity are normal. Emphysematous changes and fibrosis in the lungs. Small right pleural effusion with basilar atelectasis, similar to previous study. No developing consolidation. No pneumothorax. Calcified and tortuous aorta. Degenerative changes in the spine. IMPRESSION: 1. Chronic emphysematous changes and fibrosis in the lungs. 2. Right pleural effusion with basilar atelectasis, similar to prior study. 3. Aortic atherosclerosis. Electronically Signed   By: Lucienne Capers M.D.   On: 09/13/2017 21:16    Procedures Procedures (including critical care time)  Medications Ordered in ED Medications  levalbuterol (XOPENEX) nebulizer solution 0.63 mg (has no administration in time range)  albuterol (PROVENTIL) (2.5 MG/3ML) 0.083% nebulizer solution 2.5 mg (has no administration in time range)  albuterol (PROVENTIL) (2.5 MG/3ML) 0.083% nebulizer solution 5 mg (5 mg Nebulization Given 09/13/17 2023)  ipratropium-albuterol (DUONEB) 0.5-2.5 (3) MG/3ML nebulizer solution 3 mL (3 mLs Nebulization Given 09/13/17 2247)     Initial Impression / Assessment and Plan / ED Course  I have reviewed the triage vital signs and the nursing notes.  Pertinent labs & imaging results that were available during my care of the patient were reviewed by me and considered in my medical decision making (see chart for details).  Clinical Course as of Sep 14 2350  Sat Sep 13, 4737  2253 84 year old male COPD on home O2 here with increased shortness of breath.  States his been coughing up some phlegm with a low-grade fever.  He has been moderately dyspneic with any kind of activity.  He will likely need to be admitted for further management.   [MB]  5631 Basic metabolic panel(!) [LS]  4970 Basic metabolic panel(!) [LS]    Clinical Course User Index [LS] Fransico Meadow, PA-C [MB] Hayden Rasmussen, MD    Pt given iv solumedrol and albuterol neb.  MDM Pt is on 3 liters of 02 sats are 88% Pt has  increae in his BUN and creatinine I spoke with Hospitalist who will see pt for admission.  Final Clinical Impressions(s) / ED Diagnoses   Final diagnoses:  COPD exacerbation [facility])    ED Discharge Orders    None       Sidney Ace 09/14/17 0039    Hayden Rasmussen, MD 09/15/17 1227

## 2017-09-13 NOTE — ED Notes (Signed)
Pt returned to room from Xray. 

## 2017-09-13 NOTE — ED Triage Notes (Signed)
Reports being more sob than usual today with productive cough at times.  Reports just small amounts of thick yellow sputum.  On 3L o2 at home.  sats 88% on arrival.  Has not had a breathing treatment since this morning.

## 2017-09-13 NOTE — Telephone Encounter (Signed)
Spoke to patients daughter  He is having intermittent low grade fever, cough, short of breath, and wheezing.    He would like to try therapies at home first  Will send script for doxycycline and prednisone.  Advised to take him to hospital if he gets worse

## 2017-09-14 ENCOUNTER — Other Ambulatory Visit: Payer: Self-pay

## 2017-09-14 ENCOUNTER — Encounter (HOSPITAL_COMMUNITY): Payer: Self-pay | Admitting: Family Medicine

## 2017-09-14 DIAGNOSIS — Z9989 Dependence on other enabling machines and devices: Secondary | ICD-10-CM | POA: Diagnosis not present

## 2017-09-14 DIAGNOSIS — Z9981 Dependence on supplemental oxygen: Secondary | ICD-10-CM | POA: Diagnosis not present

## 2017-09-14 DIAGNOSIS — I480 Paroxysmal atrial fibrillation: Secondary | ICD-10-CM

## 2017-09-14 DIAGNOSIS — E119 Type 2 diabetes mellitus without complications: Secondary | ICD-10-CM | POA: Diagnosis not present

## 2017-09-14 DIAGNOSIS — I5032 Chronic diastolic (congestive) heart failure: Secondary | ICD-10-CM | POA: Diagnosis present

## 2017-09-14 DIAGNOSIS — I110 Hypertensive heart disease with heart failure: Secondary | ICD-10-CM | POA: Diagnosis present

## 2017-09-14 DIAGNOSIS — J90 Pleural effusion, not elsewhere classified: Secondary | ICD-10-CM | POA: Diagnosis not present

## 2017-09-14 DIAGNOSIS — G4733 Obstructive sleep apnea (adult) (pediatric): Secondary | ICD-10-CM | POA: Diagnosis present

## 2017-09-14 DIAGNOSIS — J441 Chronic obstructive pulmonary disease with (acute) exacerbation: Secondary | ICD-10-CM | POA: Diagnosis present

## 2017-09-14 DIAGNOSIS — E118 Type 2 diabetes mellitus with unspecified complications: Secondary | ICD-10-CM | POA: Diagnosis not present

## 2017-09-14 DIAGNOSIS — I10 Essential (primary) hypertension: Secondary | ICD-10-CM | POA: Diagnosis not present

## 2017-09-14 DIAGNOSIS — J9621 Acute and chronic respiratory failure with hypoxia: Secondary | ICD-10-CM

## 2017-09-14 DIAGNOSIS — T380X5A Adverse effect of glucocorticoids and synthetic analogues, initial encounter: Secondary | ICD-10-CM | POA: Diagnosis present

## 2017-09-14 DIAGNOSIS — I159 Secondary hypertension, unspecified: Secondary | ICD-10-CM | POA: Diagnosis not present

## 2017-09-14 DIAGNOSIS — E039 Hypothyroidism, unspecified: Secondary | ICD-10-CM | POA: Diagnosis present

## 2017-09-14 DIAGNOSIS — N400 Enlarged prostate without lower urinary tract symptoms: Secondary | ICD-10-CM | POA: Diagnosis present

## 2017-09-14 DIAGNOSIS — Z794 Long term (current) use of insulin: Secondary | ICD-10-CM | POA: Diagnosis not present

## 2017-09-14 DIAGNOSIS — Z7951 Long term (current) use of inhaled steroids: Secondary | ICD-10-CM | POA: Diagnosis not present

## 2017-09-14 DIAGNOSIS — Z87891 Personal history of nicotine dependence: Secondary | ICD-10-CM | POA: Diagnosis not present

## 2017-09-14 DIAGNOSIS — Z923 Personal history of irradiation: Secondary | ICD-10-CM | POA: Diagnosis not present

## 2017-09-14 DIAGNOSIS — E785 Hyperlipidemia, unspecified: Secondary | ICD-10-CM | POA: Diagnosis present

## 2017-09-14 DIAGNOSIS — C3401 Malignant neoplasm of right main bronchus: Secondary | ICD-10-CM | POA: Diagnosis not present

## 2017-09-14 DIAGNOSIS — E1165 Type 2 diabetes mellitus with hyperglycemia: Secondary | ICD-10-CM | POA: Diagnosis present

## 2017-09-14 DIAGNOSIS — E1142 Type 2 diabetes mellitus with diabetic polyneuropathy: Secondary | ICD-10-CM | POA: Diagnosis present

## 2017-09-14 DIAGNOSIS — Z85118 Personal history of other malignant neoplasm of bronchus and lung: Secondary | ICD-10-CM | POA: Diagnosis not present

## 2017-09-14 DIAGNOSIS — Z9221 Personal history of antineoplastic chemotherapy: Secondary | ICD-10-CM | POA: Diagnosis not present

## 2017-09-14 DIAGNOSIS — J9611 Chronic respiratory failure with hypoxia: Secondary | ICD-10-CM | POA: Diagnosis present

## 2017-09-14 DIAGNOSIS — Z7901 Long term (current) use of anticoagulants: Secondary | ICD-10-CM | POA: Diagnosis not present

## 2017-09-14 DIAGNOSIS — I509 Heart failure, unspecified: Secondary | ICD-10-CM | POA: Diagnosis not present

## 2017-09-14 LAB — CBC
HCT: 41.7 % (ref 39.0–52.0)
HEMOGLOBIN: 13.2 g/dL (ref 13.0–17.0)
MCH: 28.8 pg (ref 26.0–34.0)
MCHC: 31.7 g/dL (ref 30.0–36.0)
MCV: 91 fL (ref 78.0–100.0)
Platelets: 174 10*3/uL (ref 150–400)
RBC: 4.58 MIL/uL (ref 4.22–5.81)
RDW: 14.3 % (ref 11.5–15.5)
WBC: 6.8 10*3/uL (ref 4.0–10.5)

## 2017-09-14 LAB — BASIC METABOLIC PANEL
ANION GAP: 14 (ref 5–15)
ANION GAP: 16 — AB (ref 5–15)
BUN: 19 mg/dL (ref 6–20)
BUN: 26 mg/dL — AB (ref 6–20)
CALCIUM: 9 mg/dL (ref 8.9–10.3)
CALCIUM: 9.1 mg/dL (ref 8.9–10.3)
CO2: 26 mmol/L (ref 22–32)
CO2: 23 mmol/L (ref 22–32)
Chloride: 96 mmol/L — ABNORMAL LOW (ref 101–111)
Chloride: 94 mmol/L — ABNORMAL LOW (ref 101–111)
Creatinine, Ser: 1.24 mg/dL (ref 0.61–1.24)
Creatinine, Ser: 1.49 mg/dL — ABNORMAL HIGH (ref 0.61–1.24)
GFR calc Af Amer: 45 mL/min — ABNORMAL LOW (ref >60)
GFR calc non Af Amer: 49 mL/min — ABNORMAL LOW (ref >60)
GFR, EST AFRICAN AMERICAN: 57 mL/min — AB (ref >60)
GFR, EST NON AFRICAN AMERICAN: 39 mL/min — AB (ref >60)
GLUCOSE: 457 mg/dL — AB (ref 65–99)
Glucose, Bld: 259 mg/dL — ABNORMAL HIGH (ref 65–99)
Potassium: 4.2 mmol/L (ref 3.5–5.1)
Potassium: 3.6 mmol/L (ref 3.5–5.1)
Sodium: 136 mmol/L (ref 135–145)
Sodium: 133 mmol/L — ABNORMAL LOW (ref 135–145)

## 2017-09-14 LAB — CBG MONITORING, ED: GLUCOSE-CAPILLARY: 175 mg/dL — AB (ref 65–99)

## 2017-09-14 LAB — INFLUENZA PANEL BY PCR (TYPE A & B)
INFLAPCR: NEGATIVE
INFLBPCR: NEGATIVE

## 2017-09-14 LAB — GLUCOSE, CAPILLARY
GLUCOSE-CAPILLARY: 434 mg/dL — AB (ref 65–99)
GLUCOSE-CAPILLARY: 440 mg/dL — AB (ref 65–99)
Glucose-Capillary: 307 mg/dL — ABNORMAL HIGH (ref 65–99)
Glucose-Capillary: 336 mg/dL — ABNORMAL HIGH (ref 65–99)
Glucose-Capillary: 463 mg/dL — ABNORMAL HIGH (ref 65–99)

## 2017-09-14 MED ORDER — SODIUM CHLORIDE 0.9 % IV SOLN: 250.0000 mL | INTRAVENOUS | Status: DC | PRN

## 2017-09-14 MED ORDER — LORATADINE 10 MG PO TABS
10.0000 mg | ORAL_TABLET | Freq: Every day | ORAL | Status: DC
  Administered 2017-09-14 – 2017-09-16 (×3): 10 mg via ORAL
  Filled 2017-09-14 (×3): qty 1

## 2017-09-14 MED ORDER — ARFORMOTEROL TARTRATE 15 MCG/2ML IN NEBU
15.0000 ug | INHALATION_SOLUTION | Freq: Two times a day (BID) | RESPIRATORY_TRACT | Status: DC
  Administered 2017-09-14 – 2017-09-16 (×5): 15 ug via RESPIRATORY_TRACT
  Filled 2017-09-14 (×5): qty 2

## 2017-09-14 MED ORDER — METHYLPREDNISOLONE SODIUM SUCC 125 MG IJ SOLR
60.0000 mg | Freq: Four times a day (QID) | INTRAMUSCULAR | Status: DC
  Administered 2017-09-14 – 2017-09-15 (×6): 60 mg via INTRAVENOUS
  Filled 2017-09-14 (×6): qty 2

## 2017-09-14 MED ORDER — FLUTICASONE PROPIONATE 50 MCG/ACT NA SUSP
2.0000 | Freq: Every day | NASAL | Status: DC
  Administered 2017-09-14 – 2017-09-16 (×3): 2 via NASAL
  Filled 2017-09-14: qty 16

## 2017-09-14 MED ORDER — SODIUM CHLORIDE 0.9% FLUSH
3.0000 mL | Freq: Two times a day (BID) | INTRAVENOUS | Status: DC
  Administered 2017-09-14 – 2017-09-16 (×6): 3 mL via INTRAVENOUS

## 2017-09-14 MED ORDER — ONDANSETRON HCL 4 MG/2ML IJ SOLN: 4.0000 mg | Freq: Four times a day (QID) | INTRAMUSCULAR | Status: DC | PRN

## 2017-09-14 MED ORDER — PANTOPRAZOLE SODIUM 40 MG PO TBEC
40.0000 mg | DELAYED_RELEASE_TABLET | Freq: Every day | ORAL | Status: DC
  Administered 2017-09-14 – 2017-09-16 (×3): 40 mg via ORAL
  Filled 2017-09-14 (×3): qty 1

## 2017-09-14 MED ORDER — IPRATROPIUM-ALBUTEROL 0.5-2.5 (3) MG/3ML IN SOLN
3.0000 mL | RESPIRATORY_TRACT | Status: DC | PRN
  Filled 2017-09-14: qty 3

## 2017-09-14 MED ORDER — FLUTICASONE-UMECLIDIN-VILANT 100-62.5-25 MCG/INH IN AEPB: 1.0000 | INHALATION_SPRAY | Freq: Every day | RESPIRATORY_TRACT | Status: DC

## 2017-09-14 MED ORDER — INSULIN GLARGINE 100 UNIT/ML ~~LOC~~ SOLN
4.0000 [IU] | Freq: Once | SUBCUTANEOUS | Status: AC
  Administered 2017-09-14: 4 [IU] via SUBCUTANEOUS
  Filled 2017-09-14 (×2): qty 0.04

## 2017-09-14 MED ORDER — INSULIN ASPART 100 UNIT/ML ~~LOC~~ SOLN
0.0000 [IU] | Freq: Three times a day (TID) | SUBCUTANEOUS | Status: DC
  Administered 2017-09-15: 4 [IU] via SUBCUTANEOUS
  Administered 2017-09-15: 20 [IU] via SUBCUTANEOUS
  Administered 2017-09-15: 15 [IU] via SUBCUTANEOUS
  Administered 2017-09-16: 20 [IU] via SUBCUTANEOUS
  Administered 2017-09-16: 11 [IU] via SUBCUTANEOUS

## 2017-09-14 MED ORDER — SODIUM CHLORIDE 0.9% FLUSH: 10.0000 mL | INTRAVENOUS | Status: DC | PRN

## 2017-09-14 MED ORDER — FLUTICASONE FUROATE-VILANTEROL 100-25 MCG/INH IN AEPB
1.0000 | INHALATION_SPRAY | Freq: Every day | RESPIRATORY_TRACT | Status: DC
  Filled 2017-09-14: qty 28

## 2017-09-14 MED ORDER — INSULIN DETEMIR 100 UNIT/ML ~~LOC~~ SOLN
14.0000 [IU] | Freq: Two times a day (BID) | SUBCUTANEOUS | Status: DC
  Administered 2017-09-14: 14 [IU] via SUBCUTANEOUS
  Filled 2017-09-14 (×2): qty 0.14

## 2017-09-14 MED ORDER — HYDROCODONE-ACETAMINOPHEN 5-325 MG PO TABS: 1.0000 | ORAL_TABLET | ORAL | Status: DC | PRN

## 2017-09-14 MED ORDER — UMECLIDINIUM BROMIDE 62.5 MCG/INH IN AEPB
1.0000 | INHALATION_SPRAY | Freq: Every day | RESPIRATORY_TRACT | Status: DC
  Filled 2017-09-14: qty 7

## 2017-09-14 MED ORDER — INSULIN ASPART 100 UNIT/ML ~~LOC~~ SOLN
7.0000 [IU] | Freq: Once | SUBCUTANEOUS | Status: AC
  Administered 2017-09-14: 7 [IU] via SUBCUTANEOUS

## 2017-09-14 MED ORDER — DILTIAZEM HCL ER COATED BEADS 120 MG PO CP24
120.0000 mg | ORAL_CAPSULE | Freq: Every day | ORAL | Status: DC
  Administered 2017-09-14 – 2017-09-16 (×3): 120 mg via ORAL
  Filled 2017-09-14 (×3): qty 1

## 2017-09-14 MED ORDER — SENNOSIDES-DOCUSATE SODIUM 8.6-50 MG PO TABS: 1.0000 | ORAL_TABLET | Freq: Every evening | ORAL | Status: DC | PRN

## 2017-09-14 MED ORDER — SODIUM CHLORIDE 0.9% FLUSH: 3.0000 mL | INTRAVENOUS | Status: DC | PRN

## 2017-09-14 MED ORDER — INSULIN ASPART 100 UNIT/ML ~~LOC~~ SOLN
0.0000 [IU] | Freq: Every day | SUBCUTANEOUS | Status: DC
  Administered 2017-09-14: 5 [IU] via SUBCUTANEOUS

## 2017-09-14 MED ORDER — BISACODYL 5 MG PO TBEC: 5.0000 mg | DELAYED_RELEASE_TABLET | Freq: Every day | ORAL | Status: DC | PRN

## 2017-09-14 MED ORDER — DOXYCYCLINE HYCLATE 100 MG IV SOLR
200.0000 mg | Freq: Two times a day (BID) | INTRAVENOUS | Status: DC
  Filled 2017-09-14: qty 200

## 2017-09-14 MED ORDER — FUROSEMIDE 40 MG PO TABS
40.0000 mg | ORAL_TABLET | Freq: Two times a day (BID) | ORAL | Status: DC
  Administered 2017-09-14 – 2017-09-16 (×6): 40 mg via ORAL
  Filled 2017-09-14 (×6): qty 1

## 2017-09-14 MED ORDER — LEVOTHYROXINE SODIUM 25 MCG PO TABS
25.0000 ug | ORAL_TABLET | Freq: Every day | ORAL | Status: DC
  Administered 2017-09-14 – 2017-09-16 (×3): 25 ug via ORAL
  Filled 2017-09-14 (×3): qty 1

## 2017-09-14 MED ORDER — SODIUM CHLORIDE 0.9 % IV SOLN
100.0000 mg | Freq: Two times a day (BID) | INTRAVENOUS | Status: DC
  Administered 2017-09-14 – 2017-09-15 (×3): 100 mg via INTRAVENOUS
  Filled 2017-09-14 (×4): qty 100

## 2017-09-14 MED ORDER — SODIUM CHLORIDE 0.9% FLUSH
3.0000 mL | Freq: Two times a day (BID) | INTRAVENOUS | Status: DC
  Administered 2017-09-14 – 2017-09-16 (×5): 3 mL via INTRAVENOUS

## 2017-09-14 MED ORDER — INSULIN ASPART 100 UNIT/ML ~~LOC~~ SOLN
4.0000 [IU] | Freq: Three times a day (TID) | SUBCUTANEOUS | Status: DC
  Administered 2017-09-15 – 2017-09-16 (×5): 4 [IU] via SUBCUTANEOUS

## 2017-09-14 MED ORDER — ACETAMINOPHEN 325 MG PO TABS
650.0000 mg | ORAL_TABLET | Freq: Four times a day (QID) | ORAL | Status: DC | PRN
Start: 2017-09-14 — End: 2017-09-16
  Administered 2017-09-14: 650 mg via ORAL
  Filled 2017-09-14: qty 2

## 2017-09-14 MED ORDER — OXYBUTYNIN CHLORIDE 5 MG PO TABS
5.0000 mg | ORAL_TABLET | Freq: Every day | ORAL | Status: DC
  Administered 2017-09-14 – 2017-09-15 (×3): 5 mg via ORAL
  Filled 2017-09-14 (×3): qty 1

## 2017-09-14 MED ORDER — BUDESONIDE 0.5 MG/2ML IN SUSP
0.5000 mg | Freq: Two times a day (BID) | RESPIRATORY_TRACT | Status: DC
  Administered 2017-09-14 – 2017-09-16 (×5): 0.5 mg via RESPIRATORY_TRACT
  Filled 2017-09-14 (×5): qty 2

## 2017-09-14 MED ORDER — INSULIN DETEMIR 100 UNIT/ML ~~LOC~~ SOLN
10.0000 [IU] | Freq: Two times a day (BID) | SUBCUTANEOUS | Status: DC
  Administered 2017-09-14: 10 [IU] via SUBCUTANEOUS
  Filled 2017-09-14 (×3): qty 0.1

## 2017-09-14 MED ORDER — INSULIN ASPART 100 UNIT/ML ~~LOC~~ SOLN
20.0000 [IU] | Freq: Once | SUBCUTANEOUS | Status: AC
  Administered 2017-09-14: 20 [IU] via SUBCUTANEOUS

## 2017-09-14 MED ORDER — INSULIN ASPART 100 UNIT/ML ~~LOC~~ SOLN
0.0000 [IU] | Freq: Three times a day (TID) | SUBCUTANEOUS | Status: DC
  Administered 2017-09-14 (×2): 7 [IU] via SUBCUTANEOUS

## 2017-09-14 MED ORDER — TEMAZEPAM 15 MG PO CAPS
15.0000 mg | ORAL_CAPSULE | Freq: Every evening | ORAL | Status: DC | PRN
  Administered 2017-09-14 – 2017-09-15 (×2): 15 mg via ORAL
  Filled 2017-09-14 (×2): qty 1

## 2017-09-14 MED ORDER — ACETAMINOPHEN 650 MG RE SUPP
650.0000 mg | Freq: Four times a day (QID) | RECTAL | Status: DC | PRN
Start: 2017-09-14 — End: 2017-09-16

## 2017-09-14 MED ORDER — AMIODARONE HCL 200 MG PO TABS
200.0000 mg | ORAL_TABLET | ORAL | Status: DC
  Administered 2017-09-15: 200 mg via ORAL
  Filled 2017-09-14: qty 1

## 2017-09-14 MED ORDER — IPRATROPIUM-ALBUTEROL 0.5-2.5 (3) MG/3ML IN SOLN
3.0000 mL | Freq: Three times a day (TID) | RESPIRATORY_TRACT | Status: DC
  Administered 2017-09-14 – 2017-09-16 (×8): 3 mL via RESPIRATORY_TRACT
  Filled 2017-09-14 (×9): qty 3

## 2017-09-14 MED ORDER — ONDANSETRON HCL 4 MG PO TABS: 4.0000 mg | ORAL_TABLET | Freq: Four times a day (QID) | ORAL | Status: DC | PRN

## 2017-09-14 MED ORDER — INSULIN ASPART 100 UNIT/ML ~~LOC~~ SOLN: 0.0000 [IU] | Freq: Every day | SUBCUTANEOUS | Status: DC

## 2017-09-14 MED ORDER — ATORVASTATIN CALCIUM 20 MG PO TABS
20.0000 mg | ORAL_TABLET | Freq: Every day | ORAL | Status: DC
  Administered 2017-09-14 – 2017-09-15 (×2): 20 mg via ORAL
  Filled 2017-09-14 (×2): qty 1

## 2017-09-14 MED ORDER — APIXABAN 5 MG PO TABS
5.0000 mg | ORAL_TABLET | Freq: Two times a day (BID) | ORAL | Status: DC
  Administered 2017-09-14 – 2017-09-16 (×6): 5 mg via ORAL
  Filled 2017-09-14 (×6): qty 1

## 2017-09-14 MED ORDER — SODIUM CHLORIDE 0.9% FLUSH
10.0000 mL | Freq: Two times a day (BID) | INTRAVENOUS | Status: DC
  Administered 2017-09-14: 10 mL
  Administered 2017-09-15: 20 mL
  Administered 2017-09-15: 10 mL
  Administered 2017-09-16: 30 mL

## 2017-09-14 NOTE — H&P (Signed)
History and Physical    [name] JXB:[id number] DOB: 05/31/1925 DOA: 09/13/2017  PCP: Prince Solian, MD   Patient coming from: Home  Chief Complaint: SOB, hypoxia   HPI: [name] is a 84 y.o. male with medical history significant for radiation, history of right lung cancer status post chemo and COPD with chronic hypoxic respiratory failure, chronic diastolic CHF, paroxysmal atrial fibrillation on Eliquis, and insulin-dependent diabetes mellitus, now presenting to the emergency department for evaluation of progressive shortness of breath, subjective fevers, and hypoxia.  Patient reports progressive worsening in his chronic dyspnea over the past several days, now very short of breath with the slightest exertion.  He reports chills but has not checked his temperature.  Reports worsening in his chronic cough, occasionally productive of thick sputum.  He also reports rhinorrhea and polyarthralgias.  His son reportedly had influenza approximately 2 weeks ago.  He called his pulmonologist today and doxycycline and prednisone was sent to his pharmacy, but he had not yet filled this.  Oxygen saturation at home was noted to be 78% on his usual 3 L/min of supplemental oxygen.  They called the pulmonology clinic back with this information and were directed to the ED for further evaluation.  The patient denies any chest pain.  ED Course: Upon arrival to the ED, patient is found to be afebrile, saturating 84% on his usual 3 L/min of supplemental oxygen, tachypneic, tachycardic, and with stable blood pressure.  EKG features a sinus rhythm with nonspecific T wave abnormalities in the inferior leads.  Chest x-ray is notable for chronic emphysematous changes, fibrosis, and a stable small right pleural effusion.  Chemistry panel reveals a glucose of 202 and creatinine of 1.29, similar to priors.  CBC features a leukocytosis to 11,900.  Patient was treated with nebs in the ED, remains hemodynamically stable, and  will be admitted to the telemetry unit for ongoing evaluation and management of acute exacerbation in COPD with acute on chronic hypoxic respiratory failure.  Review of Systems:  All other systems reviewed and apart from HPI, are negative.  Past Medical History:  Diagnosis Date  . Acute diastolic CHF (congestive heart failure) ([place])   . Allergic conjunctivitis   . Allergic rhinitis   . Atrial fibrillation ([place])   . BPH (benign prostatic hyperplasia)   . Cataract   . Cellulitis    hx  . COPD (chronic obstructive pulmonary disease) ([place])       . Diabetic peripheral neuropathy ([place])   . Diverticulosis    hx  . DJD (degenerative joint disease)    Left Knee  . DVT (deep venous thrombosis) ([place])    IN RIGHT ARM 11/2010   . Hearing loss   . Hyperlipidemia   . Hypertension   . Iron deficiency anemia 03/27/2016  . Iron deficiency anemia due to chronic blood loss 03/27/2016  . Lung cancer ([place])     history of stage IIIA non-small cell lung cancer who is currently being treated with both  Radiation and Chemotherapy  . Malabsorption of iron 03/27/2016  . Obstructive sleep apnea (adult)  10/29/2012   This patient has mild obstructive sleep apnea, tested in March 2014 at [facility] sleep. He had been a CPAP user for 14 years. AHi 10.7 He was titrated to a pressure of 9 cm water( after auto - titration). CPAP at night  --- 8-10 YR AGO....,OSA-diagnosed 2000  . Pneumonia    history  . Secondary diabetes with peripheral neuropathy ([place]) 11/07/2012  Past Surgical History:  Procedure Laterality Date  . CARDIAC CATHETERIZATION  2006  . deviated septum repair    . FIBEROPTIC BRONCHOSCOPY  12/11/2010  . Insertion of a left subclavian Port-A-Cath.  12/17/10   [place]  . PORTACATH PLACEMENT  04/23/2011   Procedure: INSERTION PORT-A-CATH;  Surgeon: Pierre Bali, MD;  Location: [facility];  Service: Thoracic;;  Revision of  Porta-Cath  . VIDEO BRONCHOSCOPY  09/27/2011   Procedure: VIDEO BRONCHOSCOPY;   Surgeon: Nicanor Alcon, MD;  Location: [facility];  Service: Thoracic;  Laterality: N/A;     reports that he quit smoking about 25 years ago. His smoking use included cigarettes. He started smoking about 74 years ago. He has a 50.00 pack-year smoking history. He has never used smokeless tobacco. He reports that he does not drink alcohol or use drugs.  No Known Allergies  Family History  Problem Relation Age of Onset  . Coronary artery disease Unknown   . Cancer Unknown   . Multiple sclerosis Unknown   . Diabetes type II Unknown   . Anesthesia problems Neg Hx   . Hypotension Neg Hx   . Malignant hyperthermia Neg Hx   . Pseudochol deficiency Neg Hx      Prior to Admission medications   Medication Sig Start Date End Date Taking? Authorizing Provider  albuterol (PROVENTIL) (2.5 MG/3ML) 0.083% nebulizer solution Take 3 mLs (2.5 mg total) by nebulization every 6 (six) hours as needed for wheezing or shortness of breath. 07/22/17  Yes Collene Gobble, MD  amiodarone (PACERONE) 200 MG tablet Take 200 mg by mouth See admin instructions. 200mg  on M W F, may take 200mg  as needed for palpitations.   Yes [provider]  apixaban (ELIQUIS) 5 MG TABS tablet Take 1 tablet (5 mg total) by mouth 2 (two) times daily. 11/12/12  Yes Jacolyn Reedy, MD  atorvastatin (LIPITOR) 20 MG tablet Take 20 mg by mouth daily at 6 PM.  03/05/17  Yes [provider]  cetirizine (ZYRTEC) 10 MG tablet Take 10 mg by mouth daily.    Yes [provider]  diltiazem (CARDIZEM CD) 120 MG 24 hr capsule Take 120 mg by mouth daily.   Yes [provider]  Fluticasone-Umeclidin-Vilant (TRELEGY ELLIPTA) 100-62.5-25 MCG/INH AEPB Inhale 1 puff into the lungs daily. 05/13/17  Yes Collene Gobble, MD  furosemide (LASIX) 40 MG tablet Take 1 tablet (40 mg total) by mouth 2 (two) times daily. Patient taking differently: Take 40 mg by mouth See admin instructions. 40mg  in the morning and 40mg  at lunch time  05/11/15  Yes Thurnell Lose, MD  glucosamine-chondroitin 500-400 MG tablet Take 1 tablet by mouth 2 (two) times daily.    Yes [provider]  Insulin Detemir (LEVEMIR FLEXTOUCH) 100 UNIT/ML Pen Inject into the skin 2 (two) times daily as needed (CBG >100). 02/14/2016 If CBG >100 inject 20 units subcutaneously with breakfast and 16 units with supper.   Yes [provider]  insulin lispro (HUMALOG) 100 UNIT/ML injection Inject 5 Units into the skin 3 (three) times daily as needed for high blood sugar.    Yes [provider]  levothyroxine (SYNTHROID, LEVOTHROID) 25 MCG tablet Take 25 mcg by mouth daily before breakfast.   Yes [provider]  metFORMIN (GLUCOPHAGE-XR) 500 MG 24 hr tablet Take 500 mg by mouth 2 (two) times daily after a meal.  07/25/16  Yes [provider]  oxybutynin (DITROPAN) 5 MG tablet Take 5 mg  by mouth at bedtime.    Yes [provider]  PROAIR HFA 108 (90 Base) MCG/ACT inhaler INHALE TWO PUFFS BY MOUTH EVERY 6 HOURS AS NEEDED FOR WHEEZING AND FOR SHORTNESS OF BREATH 04/10/17  Yes Byrum, Rose Fillers, MD  temazepam (RESTORIL) 15 MG capsule TAKE ONE CAPSULE BY MOUTH AT BEDTIME AS NEEDED FOR SLEEP Patient taking differently: Take one capsule (15mg ) by mouth at bedtime. 02/02/14  Yes Volanda Napoleon, MD  doxycycline (VIBRA-TABS) 100 MG tablet Take 1 tablet (100 mg total) by mouth 2 (two) times daily. Patient not taking: Reported on 09/13/2017 09/13/17   Chesley Mires, MD    Physical Exam: Vitals:   09/13/17 2345 09/14/17 0015 09/14/17 0030 09/14/17 0100  BP: 126/79 103/60 108/75 107/70  Pulse: 83 80 76 76  Resp: (!) 21 14 (!) 21 (!) 25  Temp:      TempSrc:      SpO2: (!) 84% 92% 90% 90%  Weight:      Height:          Constitutional: NAD, calm  Eyes: PERTLA, lids and conjunctivae normal ENMT: Mucous membranes are moist. Posterior pharynx clear of any exudate or lesions.   Neck: normal, supple, no masses, no  thyromegaly Respiratory: Diminished breath sounds bilaterally, prolonged expiratory phase, scattered rhonchi. No accessory muscle use.  Cardiovascular: S1 & S2 heard, regular rate and rhythm. No significant JVD. Abdomen: No distension, no tenderness, soft. Bowel sounds normal.  Musculoskeletal: no clubbing / cyanosis. No joint deformity upper and lower extremities.   Skin: no significant rashes, lesions, ulcers. Warm, dry, well-perfused. Neurologic: CN 2-12 grossly intact. Sensation intact. Strength 5/5 in all 4 limbs.  Psychiatric: Alert and oriented x 3. Pleasant and cooperative.     Labs on Admission: I have personally reviewed following labs and imaging studies  CBC: Recent Labs  Lab 09/13/17 2048  WBC 11.9*  HGB 14.8  HCT 44.1  MCV 90.4  PLT 967   Basic Metabolic Panel: Recent Labs  Lab 09/13/17 2048  NA 136  K 4.1  CL 98*  CO2 26  GLUCOSE 202*  BUN 21*  CREATININE 1.29*  CALCIUM 8.8*   GFR: Estimated Creatinine Clearance: 44.4 mL/min (A) (by C-G formula based on SCr of 1.29 mg/dL (H)). Liver Function Tests: No results for input(s): AST, ALT, ALKPHOS, BILITOT, PROT, ALBUMIN in the last 168 hours. No results for input(s): LIPASE, AMYLASE in the last 168 hours. No results for input(s): AMMONIA in the last 168 hours. Coagulation Profile: No results for input(s): INR, PROTIME in the last 168 hours. Cardiac Enzymes: No results for input(s): CKTOTAL, CKMB, CKMBINDEX, TROPONINI in the last 168 hours. BNP (last 3 results) Recent Labs    07/22/17 1627  PROBNP 122.0*   HbA1C: No results for input(s): HGBA1C in the last 72 hours. CBG: No results for input(s): GLUCAP in the last 168 hours. Lipid Profile: No results for input(s): CHOL, HDL, LDLCALC, TRIG, CHOLHDL, LDLDIRECT in the last 72 hours. Thyroid Function Tests: No results for input(s): TSH, T4TOTAL, FREET4, T3FREE, THYROIDAB in the last 72 hours. Anemia Panel: No results for input(s): VITAMINB12, FOLATE,  FERRITIN, TIBC, IRON, RETICCTPCT in the last 72 hours. Urine analysis:    Component Value Date/Time   COLORURINE AMBER (A) 05/07/2015 1742   APPEARANCEUR CLEAR 05/07/2015 1742   LABSPEC 1.030 05/07/2015 1742   PHURINE 5.5 05/07/2015 1742   GLUCOSEU 100 (A) 05/07/2015 1742   HGBUR NEGATIVE 05/07/2015 1742   BILIRUBINUR NEGATIVE 05/07/2015 1742  KETONESUR NEGATIVE 05/07/2015 1742   PROTEINUR NEGATIVE 05/07/2015 1742   UROBILINOGEN 0.2 12/09/2010 1650   NITRITE NEGATIVE 05/07/2015 1742   LEUKOCYTESUR NEGATIVE 05/07/2015 1742   Sepsis Labs: @LABRCNTIP (procalcitonin:4,lacticidven:4) )No results found for this or any previous visit (from the past 240 hour(s)).   Radiological Exams on Admission: Dg Chest 2 View  Result Date: 09/13/2017 CLINICAL DATA:  More shortness of breath than usual today with productive cough. Home oxygen use. History of lung cancer, pneumonia, COPD, atrial fibrillation, former smoker. EXAM: CHEST - 2 VIEW COMPARISON:  07/29/2017 FINDINGS: Left central venous catheter with tip over the low cavoatrial junction region. No pneumothorax. Heart size and pulmonary vascularity are normal. Emphysematous changes and fibrosis in the lungs. Small right pleural effusion with basilar atelectasis, similar to previous study. No developing consolidation. No pneumothorax. Calcified and tortuous aorta. Degenerative changes in the spine. IMPRESSION: 1. Chronic emphysematous changes and fibrosis in the lungs. 2. Right pleural effusion with basilar atelectasis, similar to prior study. 3. Aortic atherosclerosis. Electronically Signed   By: Lucienne Capers M.D.   On: 09/13/2017 21:16    EKG: Independently reviewed. Sinus rhythm, non-specific T-wave abnormality in inferior leads.   Assessment/Plan  1. COPD exacerbation; acute on chronic hypoxic respiratory failure  - Presents with several days of worsening SOB and hypoxia  - Was saturating 78% on his usual 3 Lpm supplemental O2 at home, 84%  here  - There is mild leukocytosis and he reports recent chills  - CXR is clear  - Suspect acute COPD exacerbation, possibly precipitated by infection  - Treated with nebs in ED  - Check sputum culture and influenza PCR, start systemic steroids, start doxycyline, continue Trelegy Ellipta, continue prn nebs   2. Hx of right lung cancer  - Hx of right lung cancer that was locally advanced  - Per oncology notes, he had good response to chemotherapy and radiation and there was no sign of recurrence   - Continue oncology follow-up   3. Paroxysmal atrial fibrillation  - In a sinus rhythm on admission  - CHADS-VASc at least 4 (age x2, DM, CHF)  - Continue amiodarone, diltiazem, and Eliquis   4. Insulin-dependent DM  - A1c was 7.0% in 2016  - Managed at home with metformin, Levemir 20 units qAM and 16 units qPM, and Humalog 5 units TID prn CBG >200  - Check CBG's, continue Levemir with 10 units BID, and start Novolog per sliding scale   5. Hypothyroidism  - Continue Synthroid    6. OSA  - Continue CPAP qHS   7. Chronic diastolic CHF  - Appears euvolemic on admission  - Continue Lasix and follow I/O's     DVT prophylaxis: Eliquis  Code Status: Full  Family Communication: Daughter updated at bedside Consults called: None Admission status: inpatient     Vianne Bulls, MD [facility] Pager [phone]  If 7PM-7AM, please contact night-coverage [email or web address] Password [email or web address]  09/14/2017, 1:09 AM

## 2017-09-14 NOTE — Progress Notes (Signed)
I have seen and assessed patient and agree with Dr. Criss Rosales assessment and plan.  She is a pleasant 84 year old gentleman history of right lung cancer status post chemotherapy, COPD with chronic hypoxic respiratory failure, chronic diastolic CHF, paroxysmal atrial fibrillation on chronic anticoagulation, insulin-dependent diabetes mellitus presenting to the ED with worsening shortness of breath, diffuse wheezing, hypoxia with sats of 78% on home O2 of 3 L/min.  Workup concerning for acute COPD exacerbation.  Patient placed on IV steroids, IV doxycycline, scheduled nebulizers.  We will add Brovana, Pulmicort, Flonase, Claritin, Protonix.  As patient is on IV Solu-Medrol will change sliding scale insulin to resistant scale.  Meal coverage insulin with NovoLog 4 units 3 times daily.  Supportive care.  No charge.

## 2017-09-15 DIAGNOSIS — I10 Essential (primary) hypertension: Secondary | ICD-10-CM

## 2017-09-15 DIAGNOSIS — E119 Type 2 diabetes mellitus without complications: Secondary | ICD-10-CM

## 2017-09-15 DIAGNOSIS — J90 Pleural effusion, not elsewhere classified: Secondary | ICD-10-CM

## 2017-09-15 DIAGNOSIS — C3401 Malignant neoplasm of right main bronchus: Secondary | ICD-10-CM

## 2017-09-15 DIAGNOSIS — Z85118 Personal history of other malignant neoplasm of bronchus and lung: Secondary | ICD-10-CM

## 2017-09-15 DIAGNOSIS — I509 Heart failure, unspecified: Secondary | ICD-10-CM

## 2017-09-15 DIAGNOSIS — I159 Secondary hypertension, unspecified: Secondary | ICD-10-CM

## 2017-09-15 LAB — CBC WITH DIFFERENTIAL/PLATELET
BASOS ABS: 0 10*3/uL (ref 0.0–0.1)
Basophils Relative: 0 %
EOS ABS: 0 10*3/uL (ref 0.0–0.7)
EOS PCT: 0 %
HCT: 40.9 % (ref 39.0–52.0)
HEMOGLOBIN: 13.3 g/dL (ref 13.0–17.0)
LYMPHS ABS: 0.8 10*3/uL (ref 0.7–4.0)
Lymphocytes Relative: 6 %
MCH: 29 pg (ref 26.0–34.0)
MCHC: 32.5 g/dL (ref 30.0–36.0)
MCV: 89.3 fL (ref 78.0–100.0)
Monocytes Absolute: 0.4 10*3/uL (ref 0.1–1.0)
Monocytes Relative: 3 %
NEUTROS PCT: 91 %
Neutro Abs: 13.5 10*3/uL — ABNORMAL HIGH (ref 1.7–7.7)
PLATELETS: 180 10*3/uL (ref 150–400)
RBC: 4.58 MIL/uL (ref 4.22–5.81)
RDW: 13.7 % (ref 11.5–15.5)
WBC: 14.7 10*3/uL — AB (ref 4.0–10.5)

## 2017-09-15 LAB — BASIC METABOLIC PANEL
ANION GAP: 12 (ref 5–15)
BUN: 26 mg/dL — AB (ref 6–20)
CHLORIDE: 98 mmol/L — AB (ref 101–111)
CO2: 25 mmol/L (ref 22–32)
Calcium: 9 mg/dL (ref 8.9–10.3)
Creatinine, Ser: 1.3 mg/dL — ABNORMAL HIGH (ref 0.61–1.24)
GFR, EST AFRICAN AMERICAN: 54 mL/min — AB (ref >60)
GFR, EST NON AFRICAN AMERICAN: 46 mL/min — AB (ref >60)
Glucose, Bld: 307 mg/dL — ABNORMAL HIGH (ref 65–99)
POTASSIUM: 3.7 mmol/L (ref 3.5–5.1)
SODIUM: 135 mmol/L (ref 135–145)

## 2017-09-15 LAB — GLUCOSE, CAPILLARY
GLUCOSE-CAPILLARY: 314 mg/dL — AB (ref 65–99)
GLUCOSE-CAPILLARY: 417 mg/dL — AB (ref 65–99)
GLUCOSE-CAPILLARY: 166 mg/dL — AB (ref 65–99)
Glucose-Capillary: 191 mg/dL — ABNORMAL HIGH (ref 65–99)

## 2017-09-15 LAB — GLUCOSE, RANDOM: Glucose, Bld: 386 mg/dL — ABNORMAL HIGH (ref 65–99)

## 2017-09-15 MED ORDER — INSULIN DETEMIR 100 UNIT/ML ~~LOC~~ SOLN
18.0000 [IU] | Freq: Two times a day (BID) | SUBCUTANEOUS | Status: DC
  Administered 2017-09-15 – 2017-09-16 (×3): 18 [IU] via SUBCUTANEOUS
  Filled 2017-09-15 (×4): qty 0.18

## 2017-09-15 MED ORDER — METHYLPREDNISOLONE SODIUM SUCC 125 MG IJ SOLR
60.0000 mg | Freq: Three times a day (TID) | INTRAMUSCULAR | Status: DC
  Administered 2017-09-15 – 2017-09-16 (×3): 60 mg via INTRAVENOUS
  Filled 2017-09-15 (×3): qty 2

## 2017-09-15 MED ORDER — INSULIN ASPART 100 UNIT/ML ~~LOC~~ SOLN: 24.0000 [IU] | Freq: Once | SUBCUTANEOUS | Status: DC

## 2017-09-15 MED ORDER — INSULIN ASPART 100 UNIT/ML ~~LOC~~ SOLN
20.0000 [IU] | Freq: Once | SUBCUTANEOUS | Status: AC
  Administered 2017-09-15: 20 [IU] via SUBCUTANEOUS

## 2017-09-15 MED ORDER — DOXYCYCLINE HYCLATE 100 MG PO TABS
100.0000 mg | ORAL_TABLET | Freq: Two times a day (BID) | ORAL | Status: DC
  Administered 2017-09-15 – 2017-09-16 (×3): 100 mg via ORAL
  Filled 2017-09-15 (×3): qty 1

## 2017-09-15 NOTE — Plan of Care (Signed)
Patient  understands care plan goals and is progressing with care plan goals.

## 2017-09-15 NOTE — Progress Notes (Signed)
PROGRESS NOTE    [name]  ZOX:[id number] DOB: [birth year] DOA: 09/13/2017 PCP: Prince Solian, MD   Brief Narrative:  Patient is a pleasant 84 year old gentleman history of right lung cancer status post chemo/radiation, COPD on chronic home O2 at 3 L nasal cannula, chronic diastolic CHF, paroxysmal atrial fibrillation on Eliquis, insulin-dependent diabetes mellitus presenting to the ED with progressive worsening shortness of breath, subjective fevers and hypoxia.  Patient admitted with an acute COPD exacerbation.   Assessment & Plan:   Principal Problem:   Acute on chronic respiratory failure with hypoxia (HCC) Active Problems:   COPD with acute exacerbation (HCC)   Hypertension   Hyperlipidemia   Paroxysmal atrial fibrillation (HCC)   OSA on CPAP   Type 2 diabetes mellitus ([place])   Malignant neoplasm of hilus of right lung (HCC)  #1 acute on chronic restaurant failure with hypoxia secondary to acute COPD exacerbation Patient had presented with worsening shortness of breath on exertion and increasing O2 requirements, subjective fevers and hypoxia.  Chest x-ray done was negative for any acute infiltrate or volume overload.  Patient admitted for an acute COPD exacerbation.  Patient with some clinical improvement however not at baseline.  Will change IV doxycycline to oral doxycycline.  Decrease IV Solu-Medrol to 60 mg every 8 hours.  Continue Pulmicort, Brovana, scheduled duo nebs, Flonase, Claritin.  Mobilize  2.  Type 2 diabetes mellitus Check a hemoglobin A1c.  ABGs have ranged from 314 this morning to 463 last night.  Elevated CBGs secondary to IV steroids.  Continue to hold home dose oral hypoglycemic agents.  Increase Levemir to 18 units twice daily.  Sliding scale insulin has been changed to resistant scale.  Patient started on meal coverage insulin with NovoLog 4 units 3 times daily with meals.  Monitor CBGs closely with steroid taper.  Follow.  3.  OSA CPAP.  4.   Hypertension Currently stable.  Continue Cardizem.  5.  Paroxysmal atrial fibrillation CHA2DS2VASC 4 Continue amiodarone and Cardizem for rate control.  Eliquis for anticoagulation.  6.  Hyperlipidemia Continue statin.  7.  History of stage III adenocarcinoma of the right lung Status post radiation and chemotherapy in the past.  Patient has been seen by oncology during this hospitalization a few patient's disease has been cured.  Outpatient follow-up.  Appreciate oncology's input and recommendations.  8.  Hypothyroidism Continue Synthroid.  9.  Chronic diastolic heart failure Currently stable.  Compensated.  Currently euvolemic.  Continue home regimen of Lasix, Cardizem, Lipitor.   DVT prophylaxis: Eliquis Code Status: Full Family Communication: Updated patient and family at bedside. Disposition Plan: Pending PT evaluation.  Likely home when medically stable hopefully in the next 48-72 hours.   Consultants:   Hematology/oncology: Dr. Marin Olp 09/15/2017  Procedures:  Chest x-ray 09/13/2017  Antimicrobials:   IV doxycycline 09/14/2017>>>> oral doxycycline 09/15/2017   Subjective: Patient is sitting up at the side of the bed.  Denies any chest pain.  Feels shortness of breath is improving however not at baseline.  States left while on CPAP machine.  Objective: Vitals:   09/14/17 1946 09/14/17 2020 09/14/17 2237 09/14/17 2328  BP:    109/74  Pulse:    78  Resp:    20  Temp:    97.8 F (36.6 C)  TempSrc:    Oral  SpO2: 97%  97% 97%  Weight:  98.3 kg (216 lb 11.4 oz)    Height:        Intake/Output Summary (Last 24  hours) at 09/15/2017 0951 Last data filed at 09/15/2017 0924 Gross per 24 hour  Intake 586 ml  Output -  Net 586 ml   Filed Weights   09/13/17 2026 09/14/17 2020  Weight: 100.7 kg (222 lb) 98.3 kg (216 lb 11.4 oz)    Examination:  General exam: Appears calm and comfortable  Respiratory system: Fair air movement.  Mild to minimum expiratory wheezing.  No  crackles.  No rhonchi.  Respiratory effort normal. Cardiovascular system: S1 & S2 heard, RRR. No JVD, murmurs, rubs, gallops or clicks. No pedal edema. Gastrointestinal system: Abdomen is nondistended, soft and nontender. No organomegaly or masses felt. Normal bowel sounds heard. Central nervous system: Alert and oriented. No focal neurological deficits. Extremities: Symmetric 5 x 5 power. Skin: No rashes, lesions or ulcers Psychiatry: Judgement and insight appear normal. Mood & affect appropriate.     Data Reviewed: I have personally reviewed following labs and imaging studies  CBC: Recent Labs  Lab 09/13/17 2048 09/14/17 0642 09/15/17 0429  WBC 11.9* 6.8 14.7*  NEUTROABS  --   --  13.5*  HGB 14.8 13.2 13.3  HCT 44.1 41.7 40.9  MCV 90.4 91.0 89.3  PLT 195 174 646   Basic Metabolic Panel: Recent Labs  Lab 09/13/17 2048 09/14/17 0642 09/14/17 1708 09/15/17 0429  NA 136 136 133* 135  K 4.1 4.2 3.6 3.7  CL 98* 96* 94* 98*  CO2 26 26 23 25   GLUCOSE 202* 259* 457* 307*  BUN 21* 19 26* 26*  CREATININE 1.29* 1.24 1.49* 1.30*  CALCIUM 8.8* 9.0 9.1 9.0   GFR: Estimated Creatinine Clearance: 43.5 mL/min (A) (by C-G formula based on SCr of 1.3 mg/dL (H)). Liver Function Tests: No results for input(s): AST, ALT, ALKPHOS, BILITOT, PROT, ALBUMIN in the last 168 hours. No results for input(s): LIPASE, AMYLASE in the last 168 hours. No results for input(s): AMMONIA in the last 168 hours. Coagulation Profile: No results for input(s): INR, PROTIME in the last 168 hours. Cardiac Enzymes: No results for input(s): CKTOTAL, CKMB, CKMBINDEX, TROPONINI in the last 168 hours. BNP (last 3 results) Recent Labs    07/22/17 1627  PROBNP 122.0*   HbA1C: No results for input(s): HGBA1C in the last 72 hours. CBG: Recent Labs  Lab 09/14/17 1203 09/14/17 1650 09/14/17 1702 09/14/17 2209 09/15/17 0715  GLUCAP 336* 434* 440* 463* 314*   Lipid Profile: No results for input(s): CHOL,  HDL, LDLCALC, TRIG, CHOLHDL, LDLDIRECT in the last 72 hours. Thyroid Function Tests: No results for input(s): TSH, T4TOTAL, FREET4, T3FREE, THYROIDAB in the last 72 hours. Anemia Panel: No results for input(s): VITAMINB12, FOLATE, FERRITIN, TIBC, IRON, RETICCTPCT in the last 72 hours. Sepsis Labs: No results for input(s): PROCALCITON, LATICACIDVEN in the last 168 hours.  No results found for this or any previous visit (from the past 240 hour(s)).       Radiology Studies: Dg Chest 2 View  Result Date: 09/13/2017 CLINICAL DATA:  More shortness of breath than usual today with productive cough. Home oxygen use. History of lung cancer, pneumonia, COPD, atrial fibrillation, former smoker. EXAM: CHEST - 2 VIEW COMPARISON:  07/29/2017 FINDINGS: Left central venous catheter with tip over the low cavoatrial junction region. No pneumothorax. Heart size and pulmonary vascularity are normal. Emphysematous changes and fibrosis in the lungs. Small right pleural effusion with basilar atelectasis, similar to previous study. No developing consolidation. No pneumothorax. Calcified and tortuous aorta. Degenerative changes in the spine. IMPRESSION: 1. Chronic emphysematous changes and  fibrosis in the lungs. 2. Right pleural effusion with basilar atelectasis, similar to prior study. 3. Aortic atherosclerosis. Electronically Signed   By: Lucienne Capers M.D.   On: 09/13/2017 21:16        Scheduled Meds: . amiodarone  200 mg Oral Q M,W,F  . apixaban  5 mg Oral BID  . arformoterol  15 mcg Nebulization BID  . atorvastatin  20 mg Oral q1800  . budesonide (PULMICORT) nebulizer solution  0.5 mg Nebulization BID  . diltiazem  120 mg Oral Daily  . doxycycline  100 mg Oral Q12H  . fluticasone  2 spray Each Nare Daily  . furosemide  40 mg Oral BID WC  . insulin aspart  0-20 Units Subcutaneous TID WC  . insulin aspart  0-5 Units Subcutaneous QHS  . insulin aspart  4 Units Subcutaneous TID WC  . insulin detemir   18 Units Subcutaneous BID  . ipratropium-albuterol  3 mL Nebulization TID  . levothyroxine  25 mcg Oral QAC breakfast  . loratadine  10 mg Oral Daily  . methylPREDNISolone (SOLU-MEDROL) injection  60 mg Intravenous Q8H  . oxybutynin  5 mg Oral QHS  . pantoprazole  40 mg Oral Q0600  . sodium chloride flush  10-40 mL Intracatheter Q12H  . sodium chloride flush  3 mL Intravenous Q12H  . sodium chloride flush  3 mL Intravenous Q12H   Continuous Infusions: . sodium chloride       LOS: 1 day    Time spent: 40 minutes    Irine Seal, MD [facility] Pager [phone]  If 7PM-7AM, please contact night-coverage [email or web address] Password [facility] 09/15/2017, 9:51 AM

## 2017-09-15 NOTE — Telephone Encounter (Signed)
Pt currently admitted as of 09/13/2017. Will route to Dr. Lamonte Sakai as Juluis Rainier.

## 2017-09-15 NOTE — Care Management Note (Addendum)
Case Management Note  Patient Details  Name: [name] MRN: [id number] Date of Birth: March 26, 1925  Subjective/Objective: History of A-fib,adenocarcinoma of the right lung; Admitted for COPD exacerbation           Action/Plan: PCP noted.  Prior to admission patient lived at home.  NCM will continue to monitor for discharge transition needs.  Expected Discharge Date:    To Be Determined              Expected Discharge Plan:  (To Be determined)  In-House Referral:     Discharge planning Services  CM Consult  Post Acute Care Choice:    Choice offered to:     DME Arranged:    DME Agency:     HH Arranged:    [place] Agency:     Status of Service:  In process, will continue to follow  Kristen Cardinal, [place] 09/15/2017, 10:17 AM

## 2017-09-15 NOTE — Consult Note (Signed)
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 Shortness of Breath  : My breathing got worse.    HPI:  [name] is a real nice 84 year old white male.  I have known him for about 9 years.  He has a history of adenocarcinoma of the right lung.  This was diagnosed back in July 2012.  He underwent radiation and chemotherapy.  He has been in remission.  The last treatments were back in Decemb  He has underlying COPD.  He has atrial fibrillation.  He has some congestive heart failure.  Over the past couple weeks, his breathing has gotten worse.  He does have history of iron deficiency anemia.  We have given him IV iron in the past.  His lab work today shows a white cell count of 14.7.  Hemoglobin 13.3 and platelet count 180,000.  His MCV is 89.  He does have diabetes.  His blood sugar is 307 today.  He is getting Solu-Medrol.  He had a chest x-ray done.  This showed his chronic small right pleural effusion.  There is no evidence of malignancy.  Only last saw him back in October, his ferritin was 66 with an iron saturation of 26%.  Despite about a year that he got IV iron.  He does have a lot of occupational exposures.  He did have a past history of tobacco use.  To me, he does look pretty good.  Right     Past Medical History:  Diagnosis Date  . Acute diastolic CHF (congestive heart failure) ([place])   . Allergic conjunctivitis   . Allergic rhinitis   . Atrial fibrillation ([place])   . BPH (benign prostatic hyperplasia)   . Cataract   . Cellulitis    hx  . COPD (chronic obstructive pulmonary disease) ([place])       . Diabetic peripheral neuropathy ([place])   . Diverticulosis    hx  . DJD (degenerative joint disease)    Left Knee  . DVT (deep venous thrombosis) ([place])    IN RIGHT ARM 11/2010   . Hearing loss   . Hyperlipidemia   . Hypertension   . Iron deficiency anemia 03/27/2016   . Iron deficiency anemia due to chronic blood loss 03/27/2016  . Lung cancer ([place])     history of stage IIIA non-small cell lung cancer who is currently being treated with both  Radiation and Chemotherapy  . Malabsorption of iron 03/27/2016  . Obstructive sleep apnea (adult)  10/29/2012   This patient has mild obstructive sleep apnea, tested in March 2014 at [facility] sleep. He had been a CPAP user for 14 years. AHi 10.7 He was titrated to a pressure of 9 cm water( after auto - titration). CPAP at night  --- 8-10 YR AGO....,OSA-diagnosed 2000  . Pneumonia    history  . Secondary diabetes with peripheral neuropathy ([place]) 11/07/2012  :  Past Surgical History:  Procedure Laterality Date  . CARDIAC CATHETERIZATION  2006  . deviated septum repair    . FIBEROPTIC BRONCHOSCOPY  12/11/2010  . Insertion of a left subclavian Port-A-Cath.  12/17/10   [place]  . PORTACATH PLACEMENT  04/23/2011   Procedure: INSERTION PORT-A-CATH;  Surgeon: Pierre Bali, MD;  Location: [place];  Service: Thoracic;;  Revision of  Porta-Cath  . VIDEO BRONCHOSCOPY  09/27/2011   Procedure: VIDEO BRONCHOSCOPY;  Surgeon: Nicanor Alcon, MD;  Location: [facility] OR;  Service: Thoracic;  Laterality: N/A;  :   Current Facility-Administered Medications:  .  0.9 %  sodium chloride infusion, 250 mL, Intravenous, PRN, Opyd, Ilene Qua, MD .  acetaminophen (TYLENOL) tablet 650 mg, 650 mg, Oral, Q6H PRN, 650 mg at 09/14/17 0254 **OR** acetaminophen (TYLENOL) suppository 650 mg, 650 mg, Rectal, Q6H PRN, Opyd, Timothy S, MD .  albuterol (PROVENTIL) (2.5 MG/3ML) 0.083% nebulizer solution 2.5 mg, 2.5 mg, Nebulization, Q4H PRN, Opyd, Timothy S, MD .  amiodarone (PACERONE) tablet 200 mg, 200 mg, Oral, Q M,W,F, Opyd, Timothy S, MD .  apixaban (ELIQUIS) tablet 5 mg, 5 mg, Oral, BID, Opyd, Ilene Qua, MD, 5 mg at 09/14/17 2157 .  arformoterol (BROVANA) nebulizer solution 15 mcg, 15 mcg, Nebulization, BID, Eugenie Filler, MD, 15 mcg at 09/14/17  1946 .  atorvastatin (LIPITOR) tablet 20 mg, 20 mg, Oral, q1800, Opyd, Ilene Qua, MD, 20 mg at 09/14/17 1718 .  bisacodyl (DULCOLAX) EC tablet 5 mg, 5 mg, Oral, Daily PRN, Opyd, Timothy S, MD .  budesonide (PULMICORT) nebulizer solution 0.5 mg, 0.5 mg, Nebulization, BID, Eugenie Filler, MD, 0.5 mg at 09/14/17 1946 .  diltiazem (CARDIZEM CD) 24 hr capsule 120 mg, 120 mg, Oral, Daily, Opyd, Ilene Qua, MD, 120 mg at 09/14/17 1006 .  doxycycline (VIBRAMYCIN) 100 mg in sodium chloride 0.9 % 250 mL IVPB, 100 mg, Intravenous, Q12H, Opyd, Ilene Qua, MD, Stopped at 09/15/17 0313 .  fluticasone (FLONASE) 50 MCG/ACT nasal spray 2 spray, 2 spray, Each Nare, Daily, Eugenie Filler, MD, 2 spray at 09/14/17 1010 .  furosemide (LASIX) tablet 40 mg, 40 mg, Oral, BID WC, Opyd, Ilene Qua, MD, 40 mg at 09/14/17 1207 .  HYDROcodone-acetaminophen (NORCO/VICODIN) 5-325 MG per tablet 1-2 tablet, 1-2 tablet, Oral, Q4H PRN, Opyd, Timothy S, MD .  insulin aspart (novoLOG) injection 0-20 Units, 0-20 Units, Subcutaneous, TID WC, Eugenie Filler, MD .  insulin aspart (novoLOG) injection 0-5 Units, 0-5 Units, Subcutaneous, QHS, Eugenie Filler, MD, 5 Units at 09/14/17 2220 .  insulin aspart (novoLOG) injection 4 Units, 4 Units, Subcutaneous, TID WC, Eugenie Filler, MD .  insulin detemir (LEVEMIR) injection 14 Units, 14 Units, Subcutaneous, BID, Eugenie Filler, MD, 14 Units at 09/14/17 2225 .  ipratropium-albuterol (DUONEB) 0.5-2.5 (3) MG/3ML nebulizer solution 3 mL, 3 mL, Nebulization, TID, Opyd, Ilene Qua, MD, 3 mL at 09/14/17 1946 .  ipratropium-albuterol (DUONEB) 0.5-2.5 (3) MG/3ML nebulizer solution 3 mL, 3 mL, Nebulization, Q2H PRN, Eugenie Filler, MD .  levothyroxine (SYNTHROID, LEVOTHROID) tablet 25 mcg, 25 mcg, Oral, QAC breakfast, Opyd, Ilene Qua, MD, 25 mcg at 09/14/17 0834 .  loratadine (CLARITIN) tablet 10 mg, 10 mg, Oral, Daily, Eugenie Filler, MD, 10 mg at 09/14/17 1006 .   methylPREDNISolone sodium succinate (SOLU-MEDROL) 125 mg/2 mL injection 60 mg, 60 mg, Intravenous, Q6H, Opyd, Ilene Qua, MD, 60 mg at 09/15/17 0554 .  ondansetron (ZOFRAN) tablet 4 mg, 4 mg, Oral, Q6H PRN **OR** ondansetron (ZOFRAN) injection 4 mg, 4 mg, Intravenous, Q6H PRN, Opyd, Timothy S, MD .  oxybutynin (DITROPAN) tablet 5 mg, 5 mg, Oral, QHS, Opyd, Ilene Qua, MD, 5 mg at 09/14/17 2157 .  pantoprazole (PROTONIX) EC tablet 40 mg, 40 mg, Oral, Q0600, Eugenie Filler, MD, 40 mg at 09/15/17 0554 .  senna-docusate (Senokot-S) tablet 1 tablet, 1 tablet, Oral, QHS PRN, Opyd, Timothy S, MD .  sodium chloride flush (NS) 0.9 % injection 10-40 mL, 10-40 mL, Intracatheter,  Q12H, Eugenie Filler, MD, 10 mL at 09/14/17 2315 .  sodium chloride flush (NS) 0.9 % injection 10-40 mL, 10-40 mL, Intracatheter, PRN, Eugenie Filler, MD .  sodium chloride flush (NS) 0.9 % injection 3 mL, 3 mL, Intravenous, Q12H, Opyd, Ilene Qua, MD, 3 mL at 09/14/17 2226 .  sodium chloride flush (NS) 0.9 % injection 3 mL, 3 mL, Intravenous, Q12H, Opyd, Ilene Qua, MD, 3 mL at 09/14/17 2225 .  sodium chloride flush (NS) 0.9 % injection 3 mL, 3 mL, Intravenous, PRN, Opyd, Timothy S, MD .  temazepam (RESTORIL) capsule 15 mg, 15 mg, Oral, QHS PRN, Opyd, Ilene Qua, MD, 15 mg at 09/14/17 2157  Facility-Administered Medications Ordered in Other Encounters:  .  sodium chloride flush (NS) 0.9 % injection 10 mL, 10 mL, Intravenous, PRN, Volanda Napoleon, MD, 10 mL at 11/20/16 1153:  . amiodarone  200 mg Oral Q M,W,F  . apixaban  5 mg Oral BID  . arformoterol  15 mcg Nebulization BID  . atorvastatin  20 mg Oral q1800  . budesonide (PULMICORT) nebulizer solution  0.5 mg Nebulization BID  . diltiazem  120 mg Oral Daily  . fluticasone  2 spray Each Nare Daily  . furosemide  40 mg Oral BID WC  . insulin aspart  0-20 Units Subcutaneous TID WC  . insulin aspart  0-5 Units Subcutaneous QHS  . insulin aspart  4 Units Subcutaneous TID  WC  . insulin detemir  14 Units Subcutaneous BID  . ipratropium-albuterol  3 mL Nebulization TID  . levothyroxine  25 mcg Oral QAC breakfast  . loratadine  10 mg Oral Daily  . methylPREDNISolone (SOLU-MEDROL) injection  60 mg Intravenous Q6H  . oxybutynin  5 mg Oral QHS  . pantoprazole  40 mg Oral Q0600  . sodium chloride flush  10-40 mL Intracatheter Q12H  . sodium chloride flush  3 mL Intravenous Q12H  . sodium chloride flush  3 mL Intravenous Q12H  :  No Known Allergies:  Family History  Problem Relation Age of Onset  . Coronary artery disease Unknown   . Cancer Unknown   . Multiple sclerosis Unknown   . Diabetes type II Unknown   . Anesthesia problems Neg Hx   . Hypotension Neg Hx   . Malignant hyperthermia Neg Hx   . Pseudochol deficiency Neg Hx   :  Social History   Socioeconomic History  . Marital status: Widowed    Spouse name: Not on file  . Number of children: 5  . Years of education: 8  . Highest education level: Not on file  Occupational History  . Occupation: farmer    Comment: retired    Fish farm manager: RETIRED  Social Needs  . Financial resource strain: Not on file  . Food insecurity:    Worry: Not on file    Inability: Not on file  . Transportation needs:    Medical: Not on file    Non-medical: Not on file  Tobacco Use  . Smoking status: Former Smoker    Packs/day: 1.00    Years: 50.00    Pack years: 50.00    Types: Cigarettes    Start date: 05/18/1943    Last attempt to quit: 06/10/1992    Years since quitting: 25.2  . Smokeless tobacco: Never Used  . Tobacco comment: quit tobacco 3 years ago  Substance and Sexual Activity  . Alcohol use: No    Alcohol/week: 0.0 oz  . Drug use: No  .  Sexual activity: Not on file  Lifestyle  . Physical activity:    Days per week: Not on file    Minutes per session: Not on file  . Stress: Not on file  Relationships  . Social connections:    Talks on phone: Not on file    Gets together: Not on file     Attends religious service: Not on file    Active member of club or organization: Not on file    Attends meetings of clubs or organizations: Not on file    Relationship status: Not on file  . Intimate partner violence:    Fear of current or ex partner: Not on file    Emotionally abused: Not on file    Physically abused: Not on file    Forced sexual activity: Not on file  Other Topics Concern  . Not on file  Social History Narrative   Patient is widowed and lives alone.   Patient is retired.   Patient has five adult children.   Patient has a 8th grade education.   Patient is right-handed.   Patient does not drink caffeine.  :  Pertinent items are noted in HPI.  Exam: Patient Vitals for the past 24 hrs:  BP Temp Temp src Pulse Resp SpO2 Weight  09/14/17 2328 109/74 97.8 F (36.6 C) Oral 78 20 97 % -  09/14/17 2237 - - - - - 97 % -  09/14/17 2020 - - - - - - 216 lb 11.4 oz (98.3 kg)  09/14/17 1946 - - - - - 97 % -  09/14/17 1650 110/67 98 F (36.7 C) Oral 82 (!) 24 96 % -  09/14/17 0723 109/72 (!) 97.4 F (36.3 C) Oral 73 19 97 % -     Recent Labs    09/14/17 0642 09/15/17 0429  WBC 6.8 14.7*  HGB 13.2 13.3  HCT 41.7 40.9  PLT 174 180   Recent Labs    09/14/17 1708 09/15/17 0429  NA 133* 135  K 3.6 3.7  CL 94* 98*  CO2 23 25  GLUCOSE 457* 307*  BUN 26* 26*  CREATININE 1.49* 1.30*  CALCIUM 9.1 9.0    Blood smear review: None  Pathology: None   Assessment and Plan: [name] is a 84 year old white male.  He has a past history of stage III adenocarcinoma of the right lung.  He underwent radiation and chemotherapy.    I really think that he is cured of his disease.  I really cannot see any problems with his blood.  He is not anemic.  His MCV is doing okay.  I do not see any problem with malignancy.  I do not see any issues with recurrence.  Again, I think the risk of recurrence is going to be less than 5%.  We will follow along.  Hopefully, he will not  be in the hospital for too long.  Lattie Haw, MD  Deuteronomy 31:6 H.

## 2017-09-15 NOTE — Progress Notes (Signed)
RT filled water chamber with sterile water Patient states he will place self on CPAP when ready

## 2017-09-15 NOTE — Progress Notes (Signed)
SATURATION QUALIFICATIONS: (This note is used to comply with regulatory documentation for home oxygen)  Patient Saturations on Room Air at Rest = 84%  Patient Saturations on 4 Liters of oxygen while Ambulating = 86%  Patient Saturations on 4 Liters of oxygen at Rest=96%  Please briefly explain why patient needs home oxygen: patient desaturating with activity   Berdine Addison, RN, BSN

## 2017-09-16 DIAGNOSIS — E785 Hyperlipidemia, unspecified: Secondary | ICD-10-CM

## 2017-09-16 LAB — CBC
HCT: 41.7 % (ref 39.0–52.0)
Hemoglobin: 13.8 g/dL (ref 13.0–17.0)
MCH: 29.7 pg (ref 26.0–34.0)
MCHC: 33.1 g/dL (ref 30.0–36.0)
MCV: 89.9 fL (ref 78.0–100.0)
PLATELETS: 205 10*3/uL (ref 150–400)
RBC: 4.64 MIL/uL (ref 4.22–5.81)
RDW: 14.3 % (ref 11.5–15.5)
WBC: 16.7 10*3/uL — ABNORMAL HIGH (ref 4.0–10.5)

## 2017-09-16 LAB — BASIC METABOLIC PANEL
Anion gap: 13 (ref 5–15)
BUN: 32 mg/dL — AB (ref 6–20)
CALCIUM: 9.1 mg/dL (ref 8.9–10.3)
CO2: 23 mmol/L (ref 22–32)
CREATININE: 1.46 mg/dL — AB (ref 0.61–1.24)
Chloride: 100 mmol/L — ABNORMAL LOW (ref 101–111)
GFR calc Af Amer: 47 mL/min — ABNORMAL LOW (ref >60)
GFR, EST NON AFRICAN AMERICAN: 40 mL/min — AB (ref >60)
Glucose, Bld: 250 mg/dL — ABNORMAL HIGH (ref 65–99)
POTASSIUM: 3.7 mmol/L (ref 3.5–5.1)
SODIUM: 136 mmol/L (ref 135–145)

## 2017-09-16 LAB — HEMOGLOBIN A1C
HEMOGLOBIN A1C: 8.2 % — AB (ref 4.8–5.6)
Mean Plasma Glucose: 188.64 mg/dL

## 2017-09-16 LAB — GLUCOSE, CAPILLARY
GLUCOSE-CAPILLARY: 363 mg/dL — AB (ref 65–99)
Glucose-Capillary: 271 mg/dL — ABNORMAL HIGH (ref 65–99)
Glucose-Capillary: 285 mg/dL — ABNORMAL HIGH (ref 65–99)

## 2017-09-16 MED ORDER — HEPARIN SOD (PORK) LOCK FLUSH 100 UNIT/ML IV SOLN
500.0000 [IU] | INTRAVENOUS | Status: AC | PRN
  Administered 2017-09-16: 500 [IU]

## 2017-09-16 MED ORDER — PREDNISONE 20 MG PO TABS
60.0000 mg | ORAL_TABLET | Freq: Every day | ORAL | Status: DC
  Filled 2017-09-16: qty 3

## 2017-09-16 MED ORDER — DOXYCYCLINE HYCLATE 100 MG PO TABS
100.0000 mg | ORAL_TABLET | Freq: Two times a day (BID) | ORAL | 0 refills | Status: AC
Start: 2017-09-16 — End: 2017-09-21

## 2017-09-16 MED ORDER — ALBUTEROL SULFATE (2.5 MG/3ML) 0.083% IN NEBU: 2.5000 mg | INHALATION_SOLUTION | Freq: Three times a day (TID) | RESPIRATORY_TRACT | 0 refills | Status: AC

## 2017-09-16 MED ORDER — FLUTICASONE PROPIONATE 50 MCG/ACT NA SUSP: 2.0000 | Freq: Every day | NASAL | 0 refills | Status: DC

## 2017-09-16 MED ORDER — PANTOPRAZOLE SODIUM 40 MG PO TBEC: 40.0000 mg | DELAYED_RELEASE_TABLET | Freq: Every day | ORAL | 0 refills | Status: DC

## 2017-09-16 MED ORDER — PREDNISONE 20 MG PO TABS: 20.0000 mg | ORAL_TABLET | Freq: Every day | ORAL | 0 refills | Status: DC

## 2017-09-16 NOTE — Discharge Summary (Signed)
Physician Discharge Summary  [name] CWC:[id number] DOB: [birth year] DOA: 09/13/2017  PCP: Prince Solian, MD  Admit date: 09/13/2017 Discharge date: 09/16/2017  Time spent: 55 minutes  Recommendations for Outpatient Follow-up:  1. Follow-up with Avva, Ravisankar, MD in 1-2 weeks.  On follow-up patient will need a basic metabolic profile done to follow-up on electrolytes and renal function. 2. Follow-up with Dr. Lamonte Sakai, pulmonary in 2 weeks, for hospital follow-up on acute COPD exacerbation.   Discharge Diagnoses:  Principal Problem:   Acute on chronic respiratory failure with hypoxia (HCC) Active Problems:   COPD exacerbation (HCC)   Hypertension   Hyperlipidemia   Paroxysmal atrial fibrillation (HCC)   OSA on CPAP   Type 2 diabetes mellitus ([place])   Malignant neoplasm of hilus of right lung [facility])   Discharge Condition: Stable and improved  Diet recommendation: Heart healthy  Filed Weights   09/13/17 2026 09/14/17 2020  Weight: 100.7 kg (222 lb) 98.3 kg (216 lb 11.4 oz)    History of present illness:  Per Dr. Barbaraann Barthel is a 84 y.o. male with medical history significant for radiation, history of right lung cancer status post chemo and COPD with chronic hypoxic respiratory failure, chronic diastolic CHF, paroxysmal atrial fibrillation on Eliquis, and insulin-dependent diabetes mellitus, who presented to the emergency department for evaluation of progressive shortness of breath, subjective fevers, and hypoxia.  Patient reported progressive worsening in his chronic dyspnea over the past several days, now very short of breath with the slightest exertion.  He reported chills but has not checked his temperature.  Reported worsening in his chronic cough, occasionally productive of thick sputum.  He also reported rhinorrhea and polyarthralgias.  His son reportedly had influenza approximately 2 weeks ago.  He called his pulmonologist the day of admission, and doxycycline and  prednisone was sent to his pharmacy, but he had not yet filled this.  Oxygen saturation at home was noted to be 78% on his usual 3 L/min of supplemental oxygen.  They called the pulmonology clinic back with this information and were directed to the ED for further evaluation.  The patient denied any chest pain.  ED Course: Upon arrival to the ED, patient was found to be afebrile, saturating 84% on his usual 3 L/min of supplemental oxygen, tachypneic, tachycardic, and with stable blood pressure.  EKG features a sinus rhythm with nonspecific T wave abnormalities in the inferior leads.  Chest x-ray was notable for chronic emphysematous changes, fibrosis, and a stable small right pleural effusion.  Chemistry panel revealled a glucose of 202 and creatinine of 1.29, similar to priors.  CBC features a leukocytosis to 11,900.  Patient was treated with nebs in the ED, remained hemodynamically stable, and admitted to the telemetry unit for ongoing evaluation and management of acute exacerbation in COPD with acute on chronic hypoxic respiratory failure.    Hospital Course:  1 acute on chronic restaurant failure with hypoxia secondary to acute COPD exacerbation Patient had presented with worsening shortness of breath on exertion and increasing O2 requirements, subjective fevers and hypoxia.  Chest x-ray done was negative for any acute infiltrate or volume overload.  Patient admitted for an acute COPD exacerbation.  Patient was placed on IV steroid taper, doxycycline, Pulmicort, Brovana, scheduled duo nebs, Flonase, Claritin, PPI.  Patient improved clinically on a daily basis IV steroids were subsequently tapered down to oral prednisone.  Patient will be discharged on a prednisone taper.  Patient will be instructed to use his albuterol  nebulizer 3 times daily for the next 5 days.  Patient be discharged on 5-day course of oral doxycycline to complete a one-week course of antibiotic treatment.  Patient improved clinically  on a daily basis and be discharged in stable and improved condition.  Patient is to follow-up with Dr. Lamonte Sakai, pulmonary in 2 weeks.   2.  Type 2 diabetes mellitus Hemoglobin A1c was 8.2.  Patient had some elevated blood glucose levels during the hospitalization as patient was on IV steroid taper.  Patient was maintained on approximately home dose Levemir, placed on meal coverage NovoLog as well as sliding scale insulin.  Patient's oral hypoglycemic agents were held.  CBGs improved with steroid taper.  Outpatient follow-up.   3.  OSA CPAP nightly.  4.  Hypertension Stable on Cardizem.  Outpatient follow-up.   5.  Paroxysmal atrial fibrillation CHA2DS2VASC 4 Continued on home regimen of amiodarone and Cardizem for rate control.  Eliquis for anticoagulation.  6.  Hyperlipidemia Continued on statin.  7.  History of stage III adenocarcinoma of the right lung Status post radiation and chemotherapy in the past.  Patient has been seen by oncology during this hospitalization a feel patient's disease has been cured.  Outpatient follow-up.  Appreciate oncology's input and recommendations.  8.  Hypothyroidism Continued on home regimen of Synthroid.  9.  Chronic diastolic heart failure Main compensated throughout the hospitalization.  Patient was maintained on home regimen of Lasix, Cardizem, Lipitor.      Procedures:  Chest x-ray 09/13/2017      Consultations:  Oncology: Dr. Marin Olp 09/15/2017  Discharge Exam: Vitals:   09/16/17 1446 09/16/17 1500  BP:    Pulse: 85   Resp:    Temp:    SpO2:  92%    General: NAD Cardiovascular: RRR Respiratory: CTAB  Discharge Instructions   Discharge Instructions    Diet - low sodium heart healthy   Complete by:  As directed    Increase activity slowly   Complete by:  As directed      Allergies as of 09/16/2017   No Known Allergies     Medication List    TAKE these medications   amiodarone 200 MG tablet Commonly known  as:  PACERONE Take 200 mg by mouth See admin instructions. 200mg  on M W F, may take 200mg  as needed for palpitations.   apixaban 5 MG Tabs tablet Commonly known as:  ELIQUIS Take 1 tablet (5 mg total) by mouth 2 (two) times daily.   atorvastatin 20 MG tablet Commonly known as:  LIPITOR Take 20 mg by mouth daily at 6 PM.   cetirizine 10 MG tablet Commonly known as:  ZYRTEC Take 10 mg by mouth daily.   diltiazem 120 MG 24 hr capsule Commonly known as:  CARDIZEM CD Take 120 mg by mouth daily.   doxycycline 100 MG tablet Commonly known as:  VIBRA-TABS Take 1 tablet (100 mg total) by mouth 2 (two) times daily for 5 days.   fluticasone 50 MCG/ACT nasal spray Commonly known as:  FLONASE Place 2 sprays into both nostrils daily. Start taking on:  09/17/2017   Fluticasone-Umeclidin-Vilant 100-62.5-25 MCG/INH Aepb Commonly known as:  TRELEGY ELLIPTA Inhale 1 puff into the lungs daily.   furosemide 40 MG tablet Commonly known as:  LASIX Take 1 tablet (40 mg total) by mouth 2 (two) times daily. What changed:    when to take this  additional instructions   glucosamine-chondroitin 500-400 MG tablet Take 1 tablet by mouth 2 (  two) times daily.   insulin lispro 100 UNIT/ML injection Commonly known as:  HUMALOG Inject 5 Units into the skin 3 (three) times daily as needed for high blood sugar.   LEVEMIR FLEXTOUCH 100 UNIT/ML Pen Generic drug:  Insulin Detemir Inject into the skin 2 (two) times daily as needed (CBG >100). 02/14/2016 If CBG >100 inject 20 units subcutaneously with breakfast and 16 units with supper.   levothyroxine 25 MCG tablet Commonly known as:  SYNTHROID, LEVOTHROID Take 25 mcg by mouth daily before breakfast.   metFORMIN 500 MG 24 hr tablet Commonly known as:  GLUCOPHAGE-XR Take 500 mg by mouth 2 (two) times daily after a meal.   oxybutynin 5 MG tablet Commonly known as:  DITROPAN Take 5 mg by mouth at bedtime.   pantoprazole 40 MG tablet Commonly known  as:  PROTONIX Take 1 tablet (40 mg total) by mouth daily at 6 (six) AM. Start taking on:  09/17/2017   predniSONE 20 MG tablet Commonly known as:  DELTASONE Take 1-3 tablets (20-60 mg total) by mouth daily before breakfast. Take 3 tablets (60MG ) daily times 3 days, then 2 tablets (40MG ) daily times 3 days then 1 tablet (20MG ) daily times 3 days then stop Start taking on:  09/17/2017   PROAIR HFA 108 (90 Base) MCG/ACT inhaler Generic drug:  albuterol INHALE TWO PUFFS BY MOUTH EVERY 6 HOURS AS NEEDED FOR WHEEZING AND FOR SHORTNESS OF BREATH What changed:  Another medication with the same name was changed. Make sure you understand how and when to take each.   albuterol (2.5 MG/3ML) 0.083% nebulizer solution Commonly known as:  PROVENTIL Take 3 mLs (2.5 mg total) by nebulization 3 (three) times daily. 3 times daily times 5 days then every 6 hours as needed. What changed:    when to take this  reasons to take this  additional instructions   temazepam 15 MG capsule Commonly known as:  RESTORIL TAKE ONE CAPSULE BY MOUTH AT BEDTIME AS NEEDED FOR SLEEP What changed:    how much to take  how to take this  when to take this  additional instructions      No Known Allergies Follow-up Information    Avva, Ravisankar, MD. Schedule an appointment as soon as possible for a visit in 1 week(s).   Specialty:  Internal Medicine Why:  F/U IN 1-2 WEEKS. Contact information: [place] [address] [phone]        Collene Gobble, MD. Schedule an appointment as soon as possible for a visit in 2 week(s).   Specialty:  Pulmonary Disease Contact information: [address] [address] [phone]            The results of significant diagnostics from this hospitalization (including imaging, microbiology, ancillary and laboratory) are listed below for reference.    Significant Diagnostic Studies: Dg Chest 2 View  Result Date: 09/13/2017 CLINICAL  DATA:  More shortness of breath than usual today with productive cough. Home oxygen use. History of lung cancer, pneumonia, COPD, atrial fibrillation, former smoker. EXAM: CHEST - 2 VIEW COMPARISON:  07/29/2017 FINDINGS: Left central venous catheter with tip over the low cavoatrial junction region. No pneumothorax. Heart size and pulmonary vascularity are normal. Emphysematous changes and fibrosis in the lungs. Small right pleural effusion with basilar atelectasis, similar to previous study. No developing consolidation. No pneumothorax. Calcified and tortuous aorta. Degenerative changes in the spine. IMPRESSION: 1. Chronic emphysematous changes and fibrosis in the lungs. 2. Right  pleural effusion with basilar atelectasis, similar to prior study. 3. Aortic atherosclerosis. Electronically Signed   By: Lucienne Capers M.D.   On: 09/13/2017 21:16    Microbiology: No results found for this or any previous visit (from the past 240 hour(s)).   Labs: Basic Metabolic Panel: Recent Labs  Lab 09/13/17 2048 09/14/17 0642 09/14/17 1708 09/15/17 0429 09/15/17 1226 09/16/17 0344  NA 136 136 133* 135  --  136  K 4.1 4.2 3.6 3.7  --  3.7  CL 98* 96* 94* 98*  --  100*  CO2 26 26 23 25   --  23  GLUCOSE 202* 259* 457* 307* 386* 250*  BUN 21* 19 26* 26*  --  32*  CREATININE 1.29* 1.24 1.49* 1.30*  --  1.46*  CALCIUM 8.8* 9.0 9.1 9.0  --  9.1   Liver Function Tests: No results for input(s): AST, ALT, ALKPHOS, BILITOT, PROT, ALBUMIN in the last 168 hours. No results for input(s): LIPASE, AMYLASE in the last 168 hours. No results for input(s): AMMONIA in the last 168 hours. CBC: Recent Labs  Lab 09/13/17 2048 09/14/17 0642 09/15/17 0429 09/16/17 0344  WBC 11.9* 6.8 14.7* 16.7*  NEUTROABS  --   --  13.5*  --   HGB 14.8 13.2 13.3 13.8  HCT 44.1 41.7 40.9 41.7  MCV 90.4 91.0 89.3 89.9  PLT 195 174 180 205   Cardiac Enzymes: No results for input(s): CKTOTAL, CKMB, CKMBINDEX, TROPONINI in the last  168 hours. BNP: BNP (last 3 results) No results for input(s): BNP in the last 8760 hours.  ProBNP (last 3 results) Recent Labs    07/22/17 1627  PROBNP 122.0*    CBG: Recent Labs  Lab 09/15/17 1137 09/15/17 1643 09/15/17 2106 09/16/17 0756 09/16/17 1237  GLUCAP 417* 191* 166* 271* 363*       Signed:  Irine Seal MD.  [facility] 09/16/2017, 4:02 PM

## 2017-09-16 NOTE — Progress Notes (Signed)
PT Cancellation Note  Patient Details Name: PRINCESTON BLIZZARD MRN: [id number] DOB: 12/10/1924   Cancelled Treatment:    Reason Eval/Treat Not Completed: PT screened, no needs identified, will sign off.  Pt not ready at am arrival, by pm arrival, RN had completed taking oxygen saturations with ambulation.  Pt's family states otherwise they don't see need for PT at this time, so will sign off before completing the evaluation. 09/16/2017  Donnella Sham, [place] [phone]  (pager)   Tessie Fass Rydell Wiegel 09/16/2017, 2:58 PM

## 2017-09-16 NOTE — Progress Notes (Addendum)
SATURATION QUALIFICATIONS: (This note is used to comply with regulatory documentation for home oxygen)  Patient Saturations on Room Air at Rest = 85%  Patient Saturations on Room Air while Ambulating = not attempted due to previous reading  Patient Saturations on 4 Liters of oxygen while Ambulating = Lowest 89%, highest 94%  Please briefly explain why patient needs home oxygen:  Ambulated 120 feet. Patient will be discharged with 4L [place]. O2 already set up is present at house.   English as a second language teacher

## 2017-09-16 NOTE — Progress Notes (Signed)
Patient on 4 lpm oxygen via nasal canula.  Patient requested a prn albuterol treatment at approx 2130 for increased shortness of breath.  Restoril given as per patient's request.  Blood sugar within parameters, no coverage needed.  Patient pleasant and cooperative, safety and comfort maintained.  Call bell remains within reach.  Will continue to monitor.

## 2017-09-16 NOTE — Evaluation (Signed)
Occupational Therapy Evaluation Patient Details Name: [name] MRN: [id number] DOB: 10-04-1924 Today's Date: 09/16/2017    History of Present Illness [name] is a 84 y.o. male with medical history significant for radiation, history of right lung cancer status post chemo and COPD with chronic hypoxic respiratory failure, chronic diastolic CHF, paroxysmal atrial fibrillation on Eliquis, and insulin-dependent diabetes mellitus, now presenting to the emergency department for evaluation of progressive shortness of breath, subjective fevers, and hypoxia.  Patient reports progressive worsening in his chronic dyspnea over the past several days, now very short of breath with the slightest exertion.  He reports chills but has not checked his temperature.  Reports worsening in his chronic cough, occasionally productive of thick sputum.  He also reports rhinorrhea and polyarthralgias.  His son reportedly had influenza approximately 2 weeks ago   Clinical Impression   Pt with decline in function and safety with ADLs and ADL mobility with decreased balance and endurance. Pt ha home O2 3 L continuous, however has required 4L in hospital during activity/exertioin to maintain >88%. Pt would benefit form acute OT services to maximize level of function and safety    Follow Up Recommendations  No OT follow up;Supervision - Intermittent    Equipment Recommendations       Recommendations for Other Services PT consult     Precautions / Restrictions Precautions Precaution Comments: watch O2 SATs, 4L,  unsteady when standing Restrictions Weight Bearing Restrictions: No      Mobility Bed Mobility Overal bed mobility: Modified Independent             General bed mobility comments: pt sitting EOB upon arrival  Transfers Overall transfer level: Needs assistance Equipment used: 1 person hand held assist Transfers: Sit to/from Stand;Stand Pivot Transfers Sit to Stand: Min assist;Min  guard Stand pivot transfers: Min guard            Balance Overall balance assessment: Needs assistance;Mild deficits observed, not formally tested Sitting-balance support: No upper extremity supported;Feet supported Sitting balance-Leahy Scale: Good     Standing balance support: Single extremity supported;During functional activity Standing balance-Leahy Scale: Poor                             ADL either performed or assessed with clinical judgement   ADL Overall ADL's : Needs assistance/impaired Eating/Feeding: Set up;Sitting   Grooming: Wash/dry hands;Wash/dry face;Min guard;Standing   Upper Body Bathing: Set up;Sitting   Lower Body Bathing: Min guard;Sit to/from stand   Upper Body Dressing : Set up;Sitting   Lower Body Dressing: Min guard;Sit to/from stand   Toilet Transfer: Minimal assistance;Min guard;Ambulation;RW;Comfort height toilet   Toileting- Clothing Manipulation and Hygiene: Min guard;Sit to/from stand   Tub/ Shower Transfer: Min guard;Ambulation;3 in 1   Functional mobility during ADLs: Min guard General ADL Comments: pt unsteady which he states was happening PTA     Vision Baseline Vision/History: Wears glasses Wears Glasses: Reading only Patient Visual Report: No change from baseline       Perception     Praxis      Pertinent Vitals/Pain Pain Assessment: No/denies pain     Hand Dominance Right   Extremity/Trunk Assessment Upper Extremity Assessment Upper Extremity Assessment: Overall WFL for tasks assessed   Lower Extremity Assessment Lower Extremity Assessment: Defer to PT evaluation   Cervical / Trunk Assessment Cervical / Trunk Assessment: Normal   Communication Communication Communication: No difficulties   Cognition Arousal/Alertness:  Awake/alert Behavior During Therapy: WFL for tasks assessed/performed Overall Cognitive Status: Within Functional Limits for tasks assessed                                      General Comments       Exercises Other Exercises Other Exercises: pt and daughters educated on energy conservation strategies with handout provided   Shoulder Instructions      Home Living Family/patient expects to be discharged to:: Private residence Living Arrangements: Alone Available Help at Discharge: Family;Available PRN/intermittently Type of Home: House Home Access: Stairs to enter [employer] of Steps: 2 Entrance Stairs-Rails: None Home Layout: Two level;Full bath on main level;Bed/bath upstairs;Able to live on main level with bedroom/bathroom     Bathroom Shower/Tub: Tub/shower unit;Walk-in shower   Bathroom Toilet: Handicapped height Bathroom Accessibility: Yes   Home Equipment: [place] - single point;Shower seat;Grab bars - tub/shower;Grab bars - toilet;Hand held shower head;Walker - 2 wheels;Adaptive equipment Adaptive Equipment: Reacher;Long-handled shoe horn Additional Comments: has home O2, continuous 3 L and aware of other ADL A/E       Prior Functioning/Environment Level of Independence: Independent with assistive device(s)  Gait / Transfers Assistance Needed: pt states that he does not use AD for mobility but does reach for wall/furniture ADL's / Homemaking Assistance Needed: independent with ADLs, has housekeeper   Comments: pt still drives and cooks all his own meals        OT Problem List: Decreased activity tolerance;Impaired balance (sitting and/or standing)      OT Treatment/Interventions:      OT Goals(Current goals can be found in the care plan section) Acute Rehab OT Goals Patient Stated Goal: go home OT Goal Formulation: With patient/family Time For Goal Achievement: 09/30/17 Potential to Achieve Goals: Good ADL Goals Pt Will Perform Grooming: with supervision;with set-up;standing Pt Will Perform Lower Body Bathing: with supervision;sit to/from stand Pt Will Perform Lower Body Dressing: with supervision;sit  to/from stand Pt Will Transfer to Toilet: with supervision;ambulating;grab bars Pt Will Perform Toileting - Clothing Manipulation and hygiene: with supervision;sit to/from stand Pt Will Perform Tub/Shower Transfer: with supervision;ambulating;grab bars;shower seat  OT Frequency: Min 2X/week   Barriers to D/C:    no barriers       Co-evaluation              AM-PAC PT "6 Clicks" Daily Activity     Outcome Measure Help from another person eating meals?: None Help from another person taking care of personal grooming?: A Little Help from another person toileting, which includes using toliet, bedpan, or urinal?: A Little Help from another person bathing (including washing, rinsing, drying)?: A Little Help from another person to put on and taking off regular upper body clothing?: None Help from another person to put on and taking off regular lower body clothing?: A Little 6 Click Score: 20   End of Session Equipment Utilized During Treatment: Gait belt;Other (comment)(3 in 1)  Activity Tolerance: Patient tolerated treatment well Patient left: in bed;with call bell/phone within reach;with family/visitor present(sitting EOB)  OT Visit Diagnosis: Unsteadiness on feet (R26.81);Other abnormalities of gait and mobility (R26.89)                Time: 9798-9211 OT Time Calculation (min): 31 min Charges:  OT General Charges $OT Visit: 1 Visit OT Evaluation $OT Eval Moderate Complexity: 1 Mod OT Treatments $Therapeutic Activity: 8-22 mins G-Codes: OT G-codes **  NOT FOR INPATIENT CLASS** Functional Assessment Tool Used: AM-PAC 6 Clicks Daily Activity     Britt Bottom 09/16/2017, 1:43 PM

## 2017-09-16 NOTE — Progress Notes (Signed)
This nurse went to dray HGBA1C, CBC and BMP using port a cath.  Portacath did not have a positive blood return.  Phlebotomy notified.

## 2017-09-16 NOTE — Progress Notes (Signed)
Patient given d/c paper work. All pages reviewed and questions answered with daughter at bedside. IV team de-accessed port-a-cath. Tele d/ced. Patient to ride home with daughter [name]

## 2017-09-17 ENCOUNTER — Telehealth: Payer: Self-pay | Admitting: *Deleted

## 2017-09-17 NOTE — Telephone Encounter (Signed)
Message received from pt.'s daughter Butch Penny stating that pt was currently admitted where he was seen by Dr. Marin Olp and port was flushed.  She would like to know if scheduled appt on 09/24/17 should be moved back.  Call placed back to pt.'s daughter, Butch Penny and message left to inform her that Dr. Marin Olp is out of the office until Monday and that we will ask him about pt.'s next appt and call her once answer is received from Dr. Marin Olp.

## 2017-09-18 ENCOUNTER — Telehealth: Payer: Self-pay | Admitting: Pulmonary Disease

## 2017-09-18 NOTE — Telephone Encounter (Signed)
Called and spoke to [name]) letting her know when pt should be taking the Trelegy and Flonase Nasal spray.  Patrick Penny expressed understanding. Nothing further needed at this time.

## 2017-09-24 ENCOUNTER — Inpatient Hospital Stay: Payer: Medicare Other | Admitting: Hematology & Oncology

## 2017-09-24 ENCOUNTER — Inpatient Hospital Stay: Payer: Medicare Other

## 2017-10-05 ENCOUNTER — Encounter: Payer: Self-pay | Admitting: Emergency Medicine

## 2017-10-06 ENCOUNTER — Encounter (HOSPITAL_COMMUNITY): Payer: Self-pay

## 2017-10-06 ENCOUNTER — Emergency Department (HOSPITAL_COMMUNITY): Payer: Medicare Other

## 2017-10-06 ENCOUNTER — Inpatient Hospital Stay: Payer: Medicare Other | Admitting: Adult Health

## 2017-10-06 ENCOUNTER — Other Ambulatory Visit: Payer: Self-pay

## 2017-10-06 ENCOUNTER — Telehealth: Payer: Self-pay | Admitting: Emergency Medicine

## 2017-10-06 ENCOUNTER — Inpatient Hospital Stay (HOSPITAL_COMMUNITY)
Admission: EM | Admit: 2017-10-06 | Discharge: 2017-10-09 | DRG: 190 | Disposition: A | Discharge status: Home / Self Care | Payer: Medicare Other | Attending: Internal Medicine | Admitting: Internal Medicine

## 2017-10-06 DIAGNOSIS — Z79890 Hormone replacement therapy (postmenopausal): Secondary | ICD-10-CM | POA: Diagnosis not present

## 2017-10-06 DIAGNOSIS — E1142 Type 2 diabetes mellitus with diabetic polyneuropathy: Secondary | ICD-10-CM | POA: Diagnosis present

## 2017-10-06 DIAGNOSIS — Z923 Personal history of irradiation: Secondary | ICD-10-CM | POA: Diagnosis not present

## 2017-10-06 DIAGNOSIS — G4733 Obstructive sleep apnea (adult) (pediatric): Secondary | ICD-10-CM | POA: Diagnosis not present

## 2017-10-06 DIAGNOSIS — J441 Chronic obstructive pulmonary disease with (acute) exacerbation: Principal | ICD-10-CM

## 2017-10-06 DIAGNOSIS — J9621 Acute and chronic respiratory failure with hypoxia: Secondary | ICD-10-CM | POA: Diagnosis present

## 2017-10-06 DIAGNOSIS — I10 Essential (primary) hypertension: Secondary | ICD-10-CM | POA: Diagnosis not present

## 2017-10-06 DIAGNOSIS — Z86718 Personal history of other venous thrombosis and embolism: Secondary | ICD-10-CM

## 2017-10-06 DIAGNOSIS — T380X5A Adverse effect of glucocorticoids and synthetic analogues, initial encounter: Secondary | ICD-10-CM | POA: Diagnosis present

## 2017-10-06 DIAGNOSIS — K219 Gastro-esophageal reflux disease without esophagitis: Secondary | ICD-10-CM

## 2017-10-06 DIAGNOSIS — Z7901 Long term (current) use of anticoagulants: Secondary | ICD-10-CM | POA: Diagnosis not present

## 2017-10-06 DIAGNOSIS — I110 Hypertensive heart disease with heart failure: Secondary | ICD-10-CM | POA: Diagnosis present

## 2017-10-06 DIAGNOSIS — Z7951 Long term (current) use of inhaled steroids: Secondary | ICD-10-CM | POA: Diagnosis not present

## 2017-10-06 DIAGNOSIS — Z87891 Personal history of nicotine dependence: Secondary | ICD-10-CM | POA: Diagnosis not present

## 2017-10-06 DIAGNOSIS — Z85118 Personal history of other malignant neoplasm of bronchus and lung: Secondary | ICD-10-CM | POA: Diagnosis not present

## 2017-10-06 DIAGNOSIS — Z9981 Dependence on supplemental oxygen: Secondary | ICD-10-CM | POA: Diagnosis not present

## 2017-10-06 DIAGNOSIS — Z794 Long term (current) use of insulin: Secondary | ICD-10-CM

## 2017-10-06 DIAGNOSIS — I480 Paroxysmal atrial fibrillation: Secondary | ICD-10-CM

## 2017-10-06 DIAGNOSIS — E039 Hypothyroidism, unspecified: Secondary | ICD-10-CM

## 2017-10-06 DIAGNOSIS — I5033 Acute on chronic diastolic (congestive) heart failure: Secondary | ICD-10-CM | POA: Diagnosis present

## 2017-10-06 DIAGNOSIS — E785 Hyperlipidemia, unspecified: Secondary | ICD-10-CM

## 2017-10-06 DIAGNOSIS — E1165 Type 2 diabetes mellitus with hyperglycemia: Secondary | ICD-10-CM | POA: Diagnosis present

## 2017-10-06 DIAGNOSIS — B348 Other viral infections of unspecified site: Secondary | ICD-10-CM | POA: Diagnosis present

## 2017-10-06 DIAGNOSIS — I5032 Chronic diastolic (congestive) heart failure: Secondary | ICD-10-CM

## 2017-10-06 DIAGNOSIS — E1129 Type 2 diabetes mellitus with other diabetic kidney complication: Secondary | ICD-10-CM | POA: Diagnosis present

## 2017-10-06 DIAGNOSIS — Z9989 Dependence on other enabling machines and devices: Secondary | ICD-10-CM

## 2017-10-06 DIAGNOSIS — Z9221 Personal history of antineoplastic chemotherapy: Secondary | ICD-10-CM

## 2017-10-06 DIAGNOSIS — J9611 Chronic respiratory failure with hypoxia: Secondary | ICD-10-CM | POA: Diagnosis present

## 2017-10-06 DIAGNOSIS — C3401 Malignant neoplasm of right main bronchus: Secondary | ICD-10-CM | POA: Diagnosis present

## 2017-10-06 DIAGNOSIS — J189 Pneumonia, unspecified organism: Secondary | ICD-10-CM

## 2017-10-06 DIAGNOSIS — N400 Enlarged prostate without lower urinary tract symptoms: Secondary | ICD-10-CM | POA: Diagnosis present

## 2017-10-06 DIAGNOSIS — Z79899 Other long term (current) drug therapy: Secondary | ICD-10-CM | POA: Diagnosis not present

## 2017-10-06 LAB — CBC
HCT: 44.1 % (ref 39.0–52.0)
Hemoglobin: 14.6 g/dL (ref 13.0–17.0)
MCH: 29.7 pg (ref 26.0–34.0)
MCHC: 33.1 g/dL (ref 30.0–36.0)
MCV: 89.8 fL (ref 78.0–100.0)
PLATELETS: 210 10*3/uL (ref 150–400)
RBC: 4.91 MIL/uL (ref 4.22–5.81)
RDW: 14.9 % (ref 11.5–15.5)
WBC: 11.3 10*3/uL — ABNORMAL HIGH (ref 4.0–10.5)

## 2017-10-06 LAB — BASIC METABOLIC PANEL
Anion gap: 13 (ref 5–15)
BUN: 26 mg/dL — AB (ref 6–20)
CALCIUM: 9.4 mg/dL (ref 8.9–10.3)
CO2: 28 mmol/L (ref 22–32)
Chloride: 95 mmol/L — ABNORMAL LOW (ref 101–111)
Creatinine, Ser: 1.66 mg/dL — ABNORMAL HIGH (ref 0.61–1.24)
GFR calc Af Amer: 40 mL/min — ABNORMAL LOW (ref >60)
GFR, EST NON AFRICAN AMERICAN: 34 mL/min — AB (ref >60)
GLUCOSE: 201 mg/dL — AB (ref 65–99)
Potassium: 3.6 mmol/L (ref 3.5–5.1)
SODIUM: 136 mmol/L (ref 135–145)

## 2017-10-06 LAB — I-STAT ARTERIAL BLOOD GAS, ED
Acid-base deficit: 4 mmol/L — ABNORMAL HIGH (ref 0.0–2.0)
Bicarbonate: 20.6 mmol/L (ref 20.0–28.0)
O2 Saturation: 91 %
PCO2 ART: 35.3 mmHg (ref 32.0–48.0)
TCO2: 22 mmol/L (ref 22–32)
pH, Arterial: 7.372 (ref 7.350–7.450)
pO2, Arterial: 60 mmHg — ABNORMAL LOW (ref 83.0–108.0)

## 2017-10-06 LAB — I-STAT TROPONIN, ED: Troponin i, poc: 0 ng/mL (ref 0.00–0.08)

## 2017-10-06 LAB — CBG MONITORING, ED: Glucose-Capillary: 497 mg/dL — ABNORMAL HIGH (ref 65–99)

## 2017-10-06 MED ORDER — ALBUTEROL SULFATE (2.5 MG/3ML) 0.083% IN NEBU
5.0000 mg | INHALATION_SOLUTION | Freq: Once | RESPIRATORY_TRACT | Status: AC
  Administered 2017-10-06: 5 mg via RESPIRATORY_TRACT
  Filled 2017-10-06: qty 6

## 2017-10-06 MED ORDER — LORATADINE 10 MG PO TABS
10.0000 mg | ORAL_TABLET | Freq: Every day | ORAL | Status: DC
  Administered 2017-10-07 – 2017-10-09 (×3): 10 mg via ORAL
  Filled 2017-10-06 (×3): qty 1

## 2017-10-06 MED ORDER — INSULIN ASPART 100 UNIT/ML ~~LOC~~ SOLN
0.0000 [IU] | Freq: Three times a day (TID) | SUBCUTANEOUS | Status: DC
  Administered 2017-10-06 – 2017-10-07 (×3): 9 [IU] via SUBCUTANEOUS
  Administered 2017-10-07: 7 [IU] via SUBCUTANEOUS
  Administered 2017-10-08: 5 [IU] via SUBCUTANEOUS
  Administered 2017-10-08 (×2): 7 [IU] via SUBCUTANEOUS
  Administered 2017-10-09: 9 [IU] via SUBCUTANEOUS
  Administered 2017-10-09: 7 [IU] via SUBCUTANEOUS
  Filled 2017-10-06 (×2): qty 1

## 2017-10-06 MED ORDER — APIXABAN 5 MG PO TABS
5.0000 mg | ORAL_TABLET | Freq: Two times a day (BID) | ORAL | Status: DC
  Administered 2017-10-06 – 2017-10-09 (×6): 5 mg via ORAL
  Filled 2017-10-06 (×7): qty 1

## 2017-10-06 MED ORDER — DM-GUAIFENESIN ER 30-600 MG PO TB12: 1.0000 | ORAL_TABLET | Freq: Two times a day (BID) | ORAL | Status: DC | PRN

## 2017-10-06 MED ORDER — METHYLPREDNISOLONE SODIUM SUCC 125 MG IJ SOLR
125.0000 mg | Freq: Once | INTRAMUSCULAR | Status: AC
  Administered 2017-10-06: 125 mg via INTRAVENOUS
  Filled 2017-10-06: qty 2

## 2017-10-06 MED ORDER — FLUTICASONE PROPIONATE 50 MCG/ACT NA SUSP
2.0000 | Freq: Every day | NASAL | Status: DC
  Administered 2017-10-08 – 2017-10-09 (×2): 2 via NASAL
  Filled 2017-10-06 (×2): qty 16

## 2017-10-06 MED ORDER — AMIODARONE HCL 200 MG PO TABS: 200.0000 mg | ORAL_TABLET | ORAL | Status: DC | PRN

## 2017-10-06 MED ORDER — TEMAZEPAM 7.5 MG PO CAPS
15.0000 mg | ORAL_CAPSULE | Freq: Every evening | ORAL | Status: DC | PRN
Start: 2017-10-06 — End: 2017-10-09
  Administered 2017-10-07 – 2017-10-08 (×3): 15 mg via ORAL
  Filled 2017-10-06 (×2): qty 2

## 2017-10-06 MED ORDER — INSULIN DETEMIR 100 UNIT/ML ~~LOC~~ SOLN
10.0000 [IU] | Freq: Two times a day (BID) | SUBCUTANEOUS | Status: DC
  Administered 2017-10-06: 10 [IU] via SUBCUTANEOUS
  Filled 2017-10-06 (×2): qty 0.14

## 2017-10-06 MED ORDER — PANTOPRAZOLE SODIUM 40 MG PO TBEC
40.0000 mg | DELAYED_RELEASE_TABLET | Freq: Every day | ORAL | Status: DC
  Administered 2017-10-07 – 2017-10-09 (×3): 40 mg via ORAL
  Filled 2017-10-06 (×2): qty 1

## 2017-10-06 MED ORDER — METHYLPREDNISOLONE SODIUM SUCC 125 MG IJ SOLR
60.0000 mg | Freq: Three times a day (TID) | INTRAMUSCULAR | Status: DC
  Administered 2017-10-06 – 2017-10-08 (×7): 60 mg via INTRAVENOUS
  Filled 2017-10-06 (×8): qty 2

## 2017-10-06 MED ORDER — GLUCOSAMINE-CHONDROITIN 500-400 MG PO TABS: 1.0000 | ORAL_TABLET | Freq: Two times a day (BID) | ORAL | Status: DC

## 2017-10-06 MED ORDER — LEVOTHYROXINE SODIUM 25 MCG PO TABS
25.0000 ug | ORAL_TABLET | Freq: Every day | ORAL | Status: DC
  Administered 2017-10-07 – 2017-10-09 (×3): 25 ug via ORAL
  Filled 2017-10-06 (×3): qty 1

## 2017-10-06 MED ORDER — ATORVASTATIN CALCIUM 20 MG PO TABS
20.0000 mg | ORAL_TABLET | Freq: Every day | ORAL | Status: DC
  Administered 2017-10-07 – 2017-10-08 (×2): 20 mg via ORAL
  Filled 2017-10-06 (×3): qty 1

## 2017-10-06 MED ORDER — FUROSEMIDE 40 MG PO TABS: 40.0000 mg | ORAL_TABLET | ORAL | Status: DC

## 2017-10-06 MED ORDER — ACETAMINOPHEN 325 MG PO TABS
650.0000 mg | ORAL_TABLET | Freq: Four times a day (QID) | ORAL | Status: DC | PRN
  Administered 2017-10-08: 650 mg via ORAL
  Filled 2017-10-06: qty 2

## 2017-10-06 MED ORDER — OXYBUTYNIN CHLORIDE 5 MG PO TABS
5.0000 mg | ORAL_TABLET | Freq: Every day | ORAL | Status: DC
  Administered 2017-10-07 – 2017-10-08 (×3): 5 mg via ORAL
  Filled 2017-10-06 (×3): qty 1

## 2017-10-06 MED ORDER — ONDANSETRON HCL 4 MG/2ML IJ SOLN: 4.0000 mg | Freq: Three times a day (TID) | INTRAMUSCULAR | Status: DC | PRN

## 2017-10-06 MED ORDER — DILTIAZEM HCL ER COATED BEADS 120 MG PO CP24
120.0000 mg | ORAL_CAPSULE | Freq: Every day | ORAL | Status: DC
  Administered 2017-10-06 – 2017-10-09 (×4): 120 mg via ORAL
  Filled 2017-10-06 (×4): qty 1

## 2017-10-06 MED ORDER — IPRATROPIUM-ALBUTEROL 0.5-2.5 (3) MG/3ML IN SOLN
3.0000 mL | RESPIRATORY_TRACT | Status: DC
  Administered 2017-10-06 – 2017-10-07 (×6): 3 mL via RESPIRATORY_TRACT
  Filled 2017-10-06 (×6): qty 3

## 2017-10-06 MED ORDER — HYDRALAZINE HCL 20 MG/ML IJ SOLN: 5.0000 mg | INTRAMUSCULAR | Status: DC | PRN

## 2017-10-06 MED ORDER — ALBUTEROL (5 MG/ML) CONTINUOUS INHALATION SOLN
10.0000 mg/h | INHALATION_SOLUTION | Freq: Once | RESPIRATORY_TRACT | Status: AC
  Administered 2017-10-06: 10 mg/h via RESPIRATORY_TRACT
  Filled 2017-10-06: qty 20

## 2017-10-06 MED ORDER — LEVOFLOXACIN 500 MG PO TABS
500.0000 mg | ORAL_TABLET | Freq: Every day | ORAL | Status: AC
  Administered 2017-10-06 – 2017-10-08 (×3): 500 mg via ORAL
  Filled 2017-10-06 (×3): qty 1

## 2017-10-06 MED ORDER — ALBUTEROL SULFATE (2.5 MG/3ML) 0.083% IN NEBU: 5.0000 mg | INHALATION_SOLUTION | RESPIRATORY_TRACT | Status: DC | PRN

## 2017-10-06 MED ORDER — ALBUTEROL SULFATE (2.5 MG/3ML) 0.083% IN NEBU: 2.5000 mg | INHALATION_SOLUTION | RESPIRATORY_TRACT | Status: DC | PRN

## 2017-10-06 MED ORDER — FLUTICASONE-UMECLIDIN-VILANT 100-62.5-25 MCG/INH IN AEPB: 1.0000 | INHALATION_SPRAY | Freq: Every day | RESPIRATORY_TRACT | Status: DC

## 2017-10-06 NOTE — H&P (Signed)
History and Physical    [name]:[id number] DOB: November 24, 1924 DOA: 10/06/2017  Referring MD/NP/PA:   PCP: Prince Solian, MD   Patient coming from:  The patient is coming from home.  At baseline, pt is partially dependent for most of ADL.  Chief Complaint: SOB  HPI: [name] is a 84 y.o. male with medical history significant of right lung cancer (s/p of radiation and chemo), COPD with chronic hypoxic respiratory failure on 4L home O2, dCHF, A fib on Eliquis, and insulin-dependent diabetes mellitus,GERD, hypothyroidism, BPH, DVT, OSA on CPAP, who presents with shortness of breath.  Patient was recently hospitalized from 04/6-4/9 due to COPD exacerbation. Patient was discharged on doxycycline. Patient states that initially his SOB improved significantly, but in the past week he has developed worsening SOB again, which has been progressively getting worse. He has cough with yellow colored mucus production. No fever or chills. Patient has occasional sharp chest pain, but currently no chest pain. No tenderness in place. Patient states that he was seen by his PCP, and has been treated with prednisone and Levaquin. He took Levaquin in the past 4 days, and has 3 doses left, without significant improvement. Patient does not have runny nose or sore throat. He states that he had sinus problem, which has cleared up. Patient does not have nausea, vomiting, diarrhea, abdominal pain, symptoms of UTI or unilateral weakness.  ED Course: pt was found to have WBC 11.3, negative troponin, slightly worsening renal function, temperature normal, no tachycardia, has tachypnea, oxygen saturation 94% on room air--> 91% on 4 L nasal cannula oxygen. ABG with pH of 7.372, PCO2 35, PO2 60, Chest x-ray showed COPD, but no pulmonary edema. Patient is admitted to telemetry bed as inpatient.  Review of Systems:   General: no fevers, chills, no body weight gain, has poor appetite, has fatigue HEENT: no blurry  vision, hearing changes or sore throat Respiratory: has dyspnea, coughing, wheezing CV: no chest pain, no palpitations GI: no nausea, vomiting, abdominal pain, diarrhea, constipation GU: no dysuria, burning on urination, increased urinary frequency, hematuria  Ext: no leg edema Neuro: no unilateral weakness, numbness, or tingling, no vision change or hearing loss Skin: no rash, no skin tear. MSK: No muscle spasm, no deformity, no limitation of range of movement in spin Heme: No easy bruising.  Travel history: No recent long distant travel.  Allergy: No Known Allergies  Past Medical History:  Diagnosis Date  . Acute diastolic CHF (congestive heart failure) ([place])   . Allergic conjunctivitis   . Allergic rhinitis   . Atrial fibrillation ([place])   . BPH (benign prostatic hyperplasia)   . Cataract   . Cellulitis    hx  . COPD (chronic obstructive pulmonary disease) ([place])       . Diabetic peripheral neuropathy ([place])   . Diverticulosis    hx  . DJD (degenerative joint disease)    Left Knee  . DVT (deep venous thrombosis) ([place])    IN RIGHT ARM 11/2010   . Hearing loss   . Hyperlipidemia   . Hypertension   . Iron deficiency anemia 03/27/2016  . Iron deficiency anemia due to chronic blood loss 03/27/2016  . Lung cancer ([place])     history of stage IIIA non-small cell lung cancer who is currently being treated with both  Radiation and Chemotherapy  . Malabsorption of iron 03/27/2016  . Obstructive sleep apnea (adult)  10/29/2012   This patient has mild obstructive sleep apnea, tested in  March 2014 at [facility] sleep. He had been a CPAP user for 14 years. AHi 10.7 He was titrated to a pressure of 9 cm water( after auto - titration). CPAP at night  --- 8-10 YR AGO....,OSA-diagnosed 2000  . Pneumonia    history  . Secondary diabetes with peripheral neuropathy ([place]) 11/07/2012    Past Surgical History:  Procedure Laterality Date  . CARDIAC CATHETERIZATION  2006  . deviated septum repair     . FIBEROPTIC BRONCHOSCOPY  12/11/2010  . Insertion of a left subclavian Port-A-Cath.  12/17/10   [place]  . PORTACATH PLACEMENT  04/23/2011   Procedure: INSERTION PORT-A-CATH;  Surgeon: Pierre Bali, MD;  Location: [place];  Service: Thoracic;;  Revision of  Porta-Cath  . VIDEO BRONCHOSCOPY  09/27/2011   Procedure: VIDEO BRONCHOSCOPY;  Surgeon: Nicanor Alcon, MD;  Location: [place];  Service: Thoracic;  Laterality: N/A;    Social History:  reports that he quit smoking about 25 years ago. His smoking use included cigarettes. He started smoking about 74 years ago. He has a 50.00 pack-year smoking history. He has never used smokeless tobacco. He reports that he does not drink alcohol or use drugs.  Family History:  Family History  Problem Relation Age of Onset  . Coronary artery disease Unknown   . Cancer Unknown   . Multiple sclerosis Unknown   . Diabetes type II Unknown   . Anesthesia problems Neg Hx   . Hypotension Neg Hx   . Malignant hyperthermia Neg Hx   . Pseudochol deficiency Neg Hx      Prior to Admission medications   Medication Sig Start Date End Date Taking? Authorizing Provider  albuterol (PROVENTIL) (2.5 MG/3ML) 0.083% nebulizer solution Take 3 mLs (2.5 mg total) by nebulization 3 (three) times daily. 3 times daily times 5 days then every 6 hours as needed. 09/16/17   Eugenie Filler, MD  amiodarone (PACERONE) 200 MG tablet Take 200 mg by mouth See admin instructions. 200mg  on M W F, may take 200mg  as needed for palpitations.    [provider]  apixaban (ELIQUIS) 5 MG TABS tablet Take 1 tablet (5 mg total) by mouth 2 (two) times daily. 11/12/12   Jacolyn Reedy, MD  atorvastatin (LIPITOR) 20 MG tablet Take 20 mg by mouth daily at 6 PM.  03/05/17   [provider]  cetirizine (ZYRTEC) 10 MG tablet Take 10 mg by mouth daily.     [provider]  diltiazem (CARDIZEM CD) 120 MG 24 hr capsule Take 120 mg by mouth daily.    [provider]    fluticasone (FLONASE) 50 MCG/ACT nasal spray Place 2 sprays into both nostrils daily. 09/17/17   Eugenie Filler, MD  Fluticasone-Umeclidin-Vilant (TRELEGY ELLIPTA) 100-62.5-25 MCG/INH AEPB Inhale 1 puff into the lungs daily. 05/13/17   Collene Gobble, MD  furosemide (LASIX) 40 MG tablet Take 1 tablet (40 mg total) by mouth 2 (two) times daily. Patient taking differently: Take 40 mg by mouth See admin instructions. 40mg  in the morning and 40mg  at lunch time 05/11/15   Thurnell Lose, MD  glucosamine-chondroitin 500-400 MG tablet Take 1 tablet by mouth 2 (two) times daily.     [provider]  Insulin Detemir (LEVEMIR FLEXTOUCH) 100 UNIT/ML Pen Inject into the skin 2 (two) times daily as needed (CBG >100). 02/14/2016 If CBG >100 inject 20 units subcutaneously with breakfast and 16 units with supper.    [provider]  insulin  lispro (HUMALOG) 100 UNIT/ML injection Inject 5 Units into the skin 3 (three) times daily as needed for high blood sugar.     [provider]  levothyroxine (SYNTHROID, LEVOTHROID) 25 MCG tablet Take 25 mcg by mouth daily before breakfast.    [provider]  metFORMIN (GLUCOPHAGE-XR) 500 MG 24 hr tablet Take 500 mg by mouth 2 (two) times daily after a meal.  07/25/16   [provider]  oxybutynin (DITROPAN) 5 MG tablet Take 5 mg by mouth at bedtime.     [provider]  pantoprazole (PROTONIX) 40 MG tablet Take 1 tablet (40 mg total) by mouth daily at 6 (six) AM. 09/17/17   Eugenie Filler, MD  predniSONE (DELTASONE) 20 MG tablet Take 1-3 tablets (20-60 mg total) by mouth daily before breakfast. Take 3 tablets (60MG ) daily times 3 days, then 2 tablets (40MG ) daily times 3 days then 1 tablet (20MG ) daily times 3 days then stop 09/17/17   Eugenie Filler, MD  PROAIR HFA 108 [phone] Base) MCG/ACT inhaler INHALE TWO PUFFS BY MOUTH EVERY 6 HOURS AS NEEDED FOR WHEEZING AND FOR SHORTNESS OF BREATH 04/10/17   Collene Gobble, MD   temazepam (RESTORIL) 15 MG capsule TAKE ONE CAPSULE BY MOUTH AT BEDTIME AS NEEDED FOR SLEEP Patient taking differently: Take one capsule (15mg ) by mouth at bedtime. 02/02/14   Volanda Napoleon, MD    Physical Exam: Vitals:   10/06/17 1800 10/06/17 1930 10/06/17 1935 10/06/17 2000  BP: 112/66 113/65  121/71  Pulse: 80 71  97  Resp: (!) 23 15  (!) 27  Temp:      TempSrc:      SpO2: 96% 92% 93% 91%  Weight:      Height:       General: Not in acute distress HEENT:       Eyes: PERRL, EOMI, no scleral icterus.       ENT: No discharge from the ears and nose, no pharynx injection, no tonsillar enlargement.        Neck: No JVD, no bruit, no mass felt. Heme: No neck lymph node enlargement. Cardiac: S1/S2, RRR, No murmurs, No gallops or rubs. Respiratory:  Has diffused wheezing bilaterally. GI: Soft, nondistended, nontender, no rebound pain, no organomegaly, BS present. GU: No hematuria Ext: No pitting leg edema bilaterally. 2+DP/PT pulse bilaterally. Musculoskeletal: No joint deformities, No joint redness or warmth, no limitation of ROM in spin. Skin: No rashes.  Neuro: Alert, oriented X3, cranial nerves II-XII grossly intact, moves all extremities normally. Psych: Patient is not psychotic, no suicidal or hemocidal ideation.  Labs on Admission: I have personally reviewed following labs and imaging studies  CBC: Recent Labs  Lab 10/06/17 1200  WBC 11.3*  HGB 14.6  HCT 44.1  MCV 89.8  PLT 673   Basic Metabolic Panel: Recent Labs  Lab 10/06/17 1200  NA 136  K 3.6  CL 95*  CO2 28  GLUCOSE 201*  BUN 26*  CREATININE 1.66*  CALCIUM 9.4   GFR: Estimated Creatinine Clearance: 33.5 mL/min (A) (by C-G formula based on SCr of 1.66 mg/dL (H)). Liver Function Tests: No results for input(s): AST, ALT, ALKPHOS, BILITOT, PROT, ALBUMIN in the last 168 hours. No results for input(s): LIPASE, AMYLASE in the last 168 hours. No results for input(s): AMMONIA in the last 168  hours. Coagulation Profile: No results for input(s): INR, PROTIME in the last 168 hours. Cardiac Enzymes: No results for input(s): CKTOTAL, CKMB, CKMBINDEX, TROPONINI  in the last 168 hours. BNP (last 3 results) Recent Labs    07/22/17 1627  PROBNP 122.0*   HbA1C: No results for input(s): HGBA1C in the last 72 hours. CBG: No results for input(s): GLUCAP in the last 168 hours. Lipid Profile: No results for input(s): CHOL, HDL, LDLCALC, TRIG, CHOLHDL, LDLDIRECT in the last 72 hours. Thyroid Function Tests: No results for input(s): TSH, T4TOTAL, FREET4, T3FREE, THYROIDAB in the last 72 hours. Anemia Panel: No results for input(s): VITAMINB12, FOLATE, FERRITIN, TIBC, IRON, RETICCTPCT in the last 72 hours. Urine analysis:    Component Value Date/Time   COLORURINE AMBER (A) 05/07/2015 1742   APPEARANCEUR CLEAR 05/07/2015 1742   LABSPEC 1.030 05/07/2015 1742   PHURINE 5.5 05/07/2015 1742   GLUCOSEU 100 (A) 05/07/2015 1742   HGBUR NEGATIVE 05/07/2015 1742   BILIRUBINUR NEGATIVE 05/07/2015 1742   KETONESUR NEGATIVE 05/07/2015 1742   PROTEINUR NEGATIVE 05/07/2015 1742   UROBILINOGEN 0.2 12/09/2010 1650   NITRITE NEGATIVE 05/07/2015 1742   LEUKOCYTESUR NEGATIVE 05/07/2015 1742   Sepsis Labs: @LABRCNTIP (procalcitonin:4,lacticidven:4) )No results found for this or any previous visit (from the past 240 hour(s)).   Radiological Exams on Admission: Dg Chest 2 View  Result Date: 10/06/2017 CLINICAL DATA:  Several month history of shortness of breath with increased severity today. History of COPD, lung malignancy, diabetes, former smoker. EXAM: CHEST - 2 VIEW COMPARISON:  PA and lateral chest x-ray of September 13, 2017 FINDINGS: The lungs remain hyperinflated. The right pleural effusion has decreased slightly. The interstitial markings of both lungs remain increased. The heart and pulmonary vascularity are normal. There is calcification in the wall of the aortic arch. The porta catheter tip  projects over the midportion of the SVC. The bony thorax exhibits no acute abnormality. IMPRESSION: COPD. Slightly decreased size of the small right pleural effusion. No alveolar pneumonia nor CHF. Thoracic aortic atherosclerosis. Electronically Signed   By: David  Martinique M.D.   On: 10/06/2017 12:42     EKG: Independently reviewed.  Atrial fibrillation, QTC 356, nonspecific T-wave change.  Assessment/Plan Principal Problem:   Acute on chronic respiratory failure with hypoxia (HCC) Active Problems:   History of DVT of right axillary vein   Hypertension   Hyperlipidemia   Paroxysmal atrial fibrillation (HCC)   Long term current use of anticoagulant therapy   OSA on CPAP   Type II diabetes mellitus with renal manifestations (HCC)   COPD exacerbation (HCC)   Malignant neoplasm of hilus of right lung (HCC)   Chronic diastolic (congestive) heart failure (HCC)   GERD (gastroesophageal reflux disease)   Hypothyroidism   Acute on chronic respiratory failure with hypoxia due to COPD exacerbation: patient has productive cough, diffused wheezing on auscultation, no pulmonary edema or infiltration on chest x-ray, consistent with COPD exacerbation. In his recent admission, patient had negative flu PCR. -will admit patient to telemetry bed  -Nebulizers: scheduled Duoneb and prn albuterol -Solu-Medrol 60 mg IV tid -let pt complete 7-day course of levaquin (3 doses left) -Mucinex for cough  -Incentive spirometry -Urine S. pneumococcal antigen -Follow up blood culture x2, sputum culture, respiratory virus panel -Nasal cannula oxygen as needed to maintain O2 saturation 92% or greater  Chronic dCHF: 2-D echo on 11/07/12 showed EF of 50-55 percent. Patient does not have leg edema JVD. No pulmonary edema on chest x-ray. CHF is compensated. -Continue home dose Lasix, 40 mg twice a day -Check BNP  Type II diabetes mellitus with renal manifestations ([place]): Last A1c 8.2 on 09/16/17, poorly  controled.  Patient is taking Levemir, Humalog, metformin at home -will decrease Levemir dose from 20-16 U bid to 14-10 U bid -SSI  History of DVT of right axillary vein: -On eliquis  OSA CPAP nightly.  Hypertension: -Cardizem -IV hydralazine when necessary  Paroxysmal atrial fibrillation: CHA2DS2VASC4 -Continued on home regimen of amiodarone and Cardizem for rate control. Eliquis for anticoagulation.  Hyperlipidemia -Continued on statin.  History of stage III adenocarcinoma of the right lung: Status post radiation and chemotherapy in the past.  -f/u with oncology  Hypothyroidism: TSH was 5.649 on 02/08/15 -Continued on home regimen of Synthroid. -check TSH  GERD: -Protonix   DVT ppx: on eliquis Code Status: Full code Family Communication:   Yes, patient's daughter at bed side Disposition Plan:  Anticipate discharge back to previous home environment Consults called: none  Admission status:  Inpatient/tele     Date of Service 10/06/2017    Ivor Costa [facility] Pager [phone]  If 7PM-7AM, please contact night-coverage [email or web address] Password [facility] 10/06/2017, 8:48 PM

## 2017-10-06 NOTE — ED Provider Notes (Signed)
[facility] EMERGENCY DEPARTMENT Provider Note   CSN: [id number] Arrival date & time: 10/06/17  1140     History   Chief Complaint Chief Complaint  Patient presents with  . Shortness of Breath    HPI [name] is a 84 y.o. male.  [name] is 83.  He lives independently.  He was recently admitted for COPD exacerbation.  He was discharged approximately 3 weeks ago.  Not on home O2.  Upon completing his prednisone taper his symptoms recurred.  Is placed on doxycycline and prednisone.  Improved slightly.  Called his physician and was given Levaquin.  Is on day 10 today.  Has had progressive dyspnea over the last 2 to 3 days.  Improving slightly, but otherwise refractory to his home standard treatment for his COPD.  He was more short of breath and presents here.  Fevers.  No peripheral edema.  No chest pain.  Minimally productive cough some clear white sputum.  No hemoptysis.  HPI  Past Medical History:  Diagnosis Date  . Acute diastolic CHF (congestive heart failure) ([place])   . Allergic conjunctivitis   . Allergic rhinitis   . Atrial fibrillation ([place])   . BPH (benign prostatic hyperplasia)   . Cataract   . Cellulitis    hx  . COPD (chronic obstructive pulmonary disease) ([place])       . Diabetic peripheral neuropathy ([place])   . Diverticulosis    hx  . DJD (degenerative joint disease)    Left Knee  . DVT (deep venous thrombosis) ([place])    IN RIGHT ARM 11/2010   . Hearing loss   . Hyperlipidemia   . Hypertension   . Iron deficiency anemia 03/27/2016  . Iron deficiency anemia due to chronic blood loss 03/27/2016  . Lung cancer ([place])     history of stage IIIA non-small cell lung cancer who is currently being treated with both  Radiation and Chemotherapy  . Malabsorption of iron 03/27/2016  . Obstructive sleep apnea (adult)  10/29/2012   This patient has mild obstructive sleep apnea, tested in March 2014 at [facility] sleep. He had been a CPAP user for 14  years. AHi 10.7 He was titrated to a pressure of 9 cm water( after auto - titration). CPAP at night  --- 8-10 YR AGO....,OSA-diagnosed 2000  . Pneumonia    history  . Secondary diabetes with peripheral neuropathy ([place]) 11/07/2012    Patient Active Problem List   Diagnosis Date Noted  . Chronic diastolic (congestive) heart failure ([place]) 10/06/2017  . GERD (gastroesophageal reflux disease) 10/06/2017  . Hypothyroidism 10/06/2017  . COPD with acute exacerbation ([place]) 10/06/2017  . Acute on chronic respiratory failure with hypoxia ([place]) 09/14/2017  . Pleural effusion 07/22/2017  . Malignant neoplasm of hilus of right lung ([place]) 09/25/2016  . COPD exacerbation ([place]) 07/04/2016  . Iron deficiency anemia 03/27/2016  . Malabsorption of iron 03/27/2016  . Iron deficiency anemia due to chronic blood loss 03/27/2016  . Loss of weight 02/19/2016  . Type II diabetes mellitus with renal manifestations ([place]) 05/08/2015  . CAP (community acquired pneumonia) 05/07/2015  . Lactic acidosis 05/07/2015  . OSA on CPAP 01/03/2015  . Hypoxemia 12/31/2013  . Long term current use of anticoagulant therapy 11/12/2012  . History of DVT of right axillary vein 11/07/2012  . Secondary diabetes with peripheral neuropathy ([place]) 11/07/2012  . Hypertension 11/07/2012  . Hyperlipidemia 11/07/2012  . BPH (benign prostatic hypertrophy) 11/07/2012  . Paroxysmal atrial fibrillation (  [place]) 11/07/2012  . Diverticulosis   . Lung cancer ([place])   . Diabetic peripheral neuropathy ([place])   . COPD (chronic obstructive pulmonary disease) ([place])   . Allergic rhinitis     Past Surgical History:  Procedure Laterality Date  . CARDIAC CATHETERIZATION  2006  . deviated septum repair    . FIBEROPTIC BRONCHOSCOPY  12/11/2010  . Insertion of a left subclavian Port-A-Cath.  12/17/10   [place]  . PORTACATH PLACEMENT  04/23/2011   Procedure: INSERTION PORT-A-CATH;  Surgeon: Pierre Bali, MD;  Location: [facility];  Service: Thoracic;;   Revision of  Porta-Cath  . VIDEO BRONCHOSCOPY  09/27/2011   Procedure: VIDEO BRONCHOSCOPY;  Surgeon: Nicanor Alcon, MD;  Location: [facility] OR;  Service: Thoracic;  Laterality: N/A;        Home Medications    Prior to Admission medications   Medication Sig Start Date End Date Taking? Authorizing Provider  acetaminophen (TYLENOL) 325 MG tablet Take 650 mg by mouth every 4 (four) hours as needed for headache (chills/low grade fever).   Yes [provider]  albuterol (PROVENTIL) (2.5 MG/3ML) 0.083% nebulizer solution Take 3 mLs (2.5 mg total) by nebulization 3 (three) times daily. 3 times daily times 5 days then every 6 hours as needed. Patient taking differently: Take 2.5 mg by nebulization 3 (three) times daily.  09/16/17  Yes Eugenie Filler, MD  amiodarone (PACERONE) 200 MG tablet Take 200 mg by mouth See admin instructions. Take one tablet (200 mg) by mouth on Monday, Wednesday, Friday mornings, may also take one tablet (200 mg) as needed for palpitations   Yes [provider]  apixaban (ELIQUIS) 5 MG TABS tablet Take 1 tablet (5 mg total) by mouth 2 (two) times daily. 11/12/12  Yes Jacolyn Reedy, MD  atorvastatin (LIPITOR) 20 MG tablet Take 20 mg by mouth at bedtime.  03/05/17  Yes [provider]  cetirizine (ZYRTEC) 10 MG tablet Take 10 mg by mouth daily.    Yes [provider]  diltiazem (CARDIZEM CD) 120 MG 24 hr capsule Take 120 mg by mouth daily.   Yes [provider]  fluticasone (FLONASE) 50 MCG/ACT nasal spray Place 2 sprays into both nostrils daily. 09/17/17  Yes Eugenie Filler, MD  Fluticasone-Umeclidin-Vilant (TRELEGY ELLIPTA) 100-62.5-25 MCG/INH AEPB Inhale 1 puff into the lungs daily. 05/13/17  Yes Collene Gobble, MD  furosemide (LASIX) 40 MG tablet Take 1 tablet (40 mg total) by mouth 2 (two) times daily. Patient taking differently: Take 40 mg by mouth 2 (two) times daily with breakfast and lunch.  05/11/15  Yes Thurnell Lose,  MD  glucosamine-chondroitin 500-400 MG tablet Take 1 tablet by mouth 2 (two) times daily with a meal.    Yes [provider]  Insulin Detemir (LEVEMIR FLEXTOUCH) 100 UNIT/ML Pen Inject 22-26 Units into the skin See admin instructions. Inject 26 units subcutaneously before breakfast, inject 22 units at bedtime   Yes [provider]  insulin lispro (HUMALOG) 100 UNIT/ML injection Inject 6-7 Units into the skin 3 (three) times daily as needed for high blood sugar (CBG >150 (150-250 6 units, >250 7 units)).    Yes [provider]  levofloxacin (LEVAQUIN) 500 MG tablet Take 500 mg by mouth daily. 7 day course started 10/03/17 10/03/17  Yes [provider]  levothyroxine (SYNTHROID, LEVOTHROID) 25 MCG tablet Take 25 mcg by mouth daily before breakfast.   Yes [provider]  metFORMIN (GLUCOPHAGE-XR) 500 MG 24 hr  tablet Take 500 mg by mouth 2 (two) times daily after a meal.  07/25/16  Yes [provider]  oxybutynin (DITROPAN) 5 MG tablet Take 5 mg by mouth at bedtime.    Yes [provider]  pantoprazole (PROTONIX) 40 MG tablet Take 1 tablet (40 mg total) by mouth daily at 6 (six) AM. Patient taking differently: Take 40 mg by mouth See admin instructions. Take one tablet (40 mg) by mouth before breakfast - 30 minutes after levothyroxine 09/17/17  Yes Eugenie Filler, MD  Polyvinyl Alcohol-Povidone (REFRESH OP) Place 1 drop into both eyes daily as needed (dry eyes).   Yes [provider]  PROAIR HFA 108 (90 Base) MCG/ACT inhaler INHALE TWO PUFFS BY MOUTH EVERY 6 HOURS AS NEEDED FOR WHEEZING AND FOR SHORTNESS OF BREATH 04/10/17  Yes Byrum, Rose Fillers, MD  temazepam (RESTORIL) 15 MG capsule TAKE ONE CAPSULE BY MOUTH AT BEDTIME AS NEEDED FOR SLEEP Patient taking differently: TAKE ONE CAPSULE BY MOUTH DAILY AT BEDTIME 02/02/14  Yes Ennever, Rudell Cobb, MD  predniSONE (DELTASONE) 10 MG tablet Take 20 mg by mouth daily. 6 day course completed 10/06/17  09/29/17   [provider]    Family History Family History  Problem Relation Age of Onset  . Coronary artery disease Unknown   . Cancer Unknown   . Multiple sclerosis Unknown   . Diabetes type II Unknown   . Anesthesia problems Neg Hx   . Hypotension Neg Hx   . Malignant hyperthermia Neg Hx   . Pseudochol deficiency Neg Hx     Social History Social History   Tobacco Use  . Smoking status: Former Smoker    Packs/day: 1.00    Years: 50.00    Pack years: 50.00    Types: Cigarettes    Start date: 05/18/1943    Last attempt to quit: 06/10/1992    Years since quitting: 25.3  . Smokeless tobacco: Never Used  . Tobacco comment: quit tobacco 3 years ago  Substance Use Topics  . Alcohol use: No    Alcohol/week: 0.0 oz  . Drug use: No     Allergies   Patient has no known allergies.   Review of Systems Review of Systems  Constitutional: Negative for appetite change, chills, diaphoresis, fatigue and fever.  HENT: Positive for congestion, rhinorrhea, sinus pressure and sinus pain. Negative for mouth sores, sore throat and trouble swallowing.   Eyes: Negative for visual disturbance.  Respiratory: Positive for cough, shortness of breath and wheezing. Negative for chest tightness.   Cardiovascular: Negative for chest pain.  Gastrointestinal: Negative for abdominal distention, abdominal pain, diarrhea, nausea and vomiting.  Endocrine: Negative for polydipsia, polyphagia and polyuria.  Genitourinary: Negative for dysuria, frequency and hematuria.  Musculoskeletal: Negative for gait problem.  Skin: Negative for color change, pallor and rash.  Neurological: Positive for dizziness and weakness. Negative for syncope, light-headedness and headaches.  Hematological: Does not bruise/bleed easily.  Psychiatric/Behavioral: Negative for behavioral problems and confusion.     Physical Exam Updated Vital Signs BP 113/67   Pulse 99   Temp 98.1 F (36.7 C) (Oral)   Resp (!) 26    Ht 5\' 10"  (1.778 m)   Wt 94.8 kg (209 lb)   SpO2 91%   BMI 29.99 kg/m   Physical Exam  Constitutional: He is oriented to person, place, and time. He appears well-developed and well-nourished. No distress.  HENT:  Head: Normocephalic.  Eyes: Pupils are equal, round, and reactive to light.  Conjunctivae are normal. No scleral icterus.  Neck: Normal range of motion. Neck supple. No thyromegaly present.  Cardiovascular: Normal rate and regular rhythm. Exam reveals no gallop and no friction rub.  No murmur heard. Pulmonary/Chest: No respiratory distress. He has no wheezes. He has no rales.  Tachypneic.  Increased work of breathing.  Prolongation and audible wheezing.  No crackles or rales.  No JVD.  No gallop.  No peripheral edema.  Abdominal: Soft. Bowel sounds are normal. He exhibits no distension. There is no tenderness. There is no rebound.  Musculoskeletal: Normal range of motion.  Neurological: He is alert and oriented to person, place, and time.  Skin: Skin is warm and dry. No rash noted.  Psychiatric: He has a normal mood and affect. His behavior is normal.     ED Treatments / Results  Labs (all labs ordered are listed, but only abnormal results are displayed) Labs Reviewed  CBC - Abnormal; Notable for the following components:      Result Value   WBC 11.3 (*)    All other components within normal limits  BASIC METABOLIC PANEL - Abnormal; Notable for the following components:   Chloride 95 (*)    Glucose, Bld 201 (*)    BUN 26 (*)    Creatinine, Ser 1.66 (*)    GFR calc non Af Amer 34 (*)    GFR calc Af Amer 40 (*)    All other components within normal limits  I-STAT ARTERIAL BLOOD GAS, ED - Abnormal; Notable for the following components:   pO2, Arterial 60.0 (*)    Acid-base deficit 4.0 (*)    All other components within normal limits  RESPIRATORY PANEL BY PCR  CULTURE, BLOOD (ROUTINE X 2)  CULTURE, BLOOD (ROUTINE X 2)  CULTURE, EXPECTORATED SPUTUM-ASSESSMENT    GRAM STAIN  BLOOD GAS, ARTERIAL  BRAIN NATRIURETIC PEPTIDE  TSH  STREP PNEUMONIAE URINARY ANTIGEN  I-STAT TROPONIN, ED  I-STAT TROPONIN, ED    EKG EKG Interpretation  Date/Time:  Monday October 06 2017 11:51:44 EDT Ventricular Rate:  64 PR Interval:  190 QRS Duration: 112 QT Interval:  346 QTC Calculation: 356 R Axis:   67 Text Interpretation:  Sinus rhythm with Premature atrial complexes Nonspecific ST and T wave abnormality Abnormal ECG Confirmed by Tanna Furry [phone]) on 10/06/2017 10:42:56 PM   Radiology Dg Chest 2 View  Result Date: 10/06/2017 CLINICAL DATA:  Several month history of shortness of breath with increased severity today. History of COPD, lung malignancy, diabetes, former smoker. EXAM: CHEST - 2 VIEW COMPARISON:  PA and lateral chest x-ray of September 13, 2017 FINDINGS: The lungs remain hyperinflated. The right pleural effusion has decreased slightly. The interstitial markings of both lungs remain increased. The heart and pulmonary vascularity are normal. There is calcification in the wall of the aortic arch. The porta catheter tip projects over the midportion of the SVC. The bony thorax exhibits no acute abnormality. IMPRESSION: COPD. Slightly decreased size of the small right pleural effusion. No alveolar pneumonia nor CHF. Thoracic aortic atherosclerosis. Electronically Signed   By: David  Martinique M.D.   On: 10/06/2017 12:42    Procedures Procedures (including critical care time)  Medications Ordered in ED Medications  methylPREDNISolone sodium succinate (SOLU-MEDROL) 125 mg/2 mL injection 60 mg (has no administration in time range)  amiodarone (PACERONE) tablet 200 mg (has no administration in time range)  apixaban (ELIQUIS) tablet 5 mg (5 mg Oral Given 10/06/17 2148)  atorvastatin (LIPITOR) tablet 20 mg (has  no administration in time range)  loratadine (CLARITIN) tablet 10 mg (has no administration in time range)  diltiazem (CARDIZEM CD) 24 hr capsule 120 mg (120  mg Oral Given 10/06/17 2147)  fluticasone (FLONASE) 50 MCG/ACT nasal spray 2 spray (has no administration in time range)  Fluticasone-Umeclidin-Vilant 100-62.5-25 MCG/INH AEPB 1 puff (has no administration in time range)  furosemide (LASIX) tablet 40 mg (has no administration in time range)  insulin detemir (LEVEMIR) injection 10-14 Units (has no administration in time range)  levothyroxine (SYNTHROID, LEVOTHROID) tablet 25 mcg (has no administration in time range)  oxybutynin (DITROPAN) tablet 5 mg (has no administration in time range)  pantoprazole (PROTONIX) EC tablet 40 mg (has no administration in time range)  temazepam (RESTORIL) capsule 15 mg (has no administration in time range)  albuterol (PROVENTIL) (2.5 MG/3ML) 0.083% nebulizer solution 2.5 mg (has no administration in time range)  ipratropium-albuterol (DUONEB) 0.5-2.5 (3) MG/3ML nebulizer solution 3 mL (3 mLs Nebulization Given 10/06/17 2147)  dextromethorphan-guaiFENesin (MUCINEX DM) 30-600 MG per 12 hr tablet 1 tablet (has no administration in time range)  levofloxacin (LEVAQUIN) tablet 500 mg (500 mg Oral Given 10/06/17 2147)  insulin aspart (novoLOG) injection 0-9 Units (has no administration in time range)  ondansetron (ZOFRAN) injection 4 mg (has no administration in time range)  hydrALAZINE (APRESOLINE) injection 5 mg (has no administration in time range)  acetaminophen (TYLENOL) tablet 650 mg (has no administration in time range)  albuterol (PROVENTIL) (2.5 MG/3ML) 0.083% nebulizer solution 5 mg (5 mg Nebulization Given 10/06/17 1157)  albuterol (PROVENTIL,VENTOLIN) solution continuous neb (10 mg/hr Nebulization Given 10/06/17 1658)  methylPREDNISolone sodium succinate (SOLU-MEDROL) 125 mg/2 mL injection 125 mg (125 mg Intravenous Given 10/06/17 1705)     Initial Impression / Assessment and Plan / ED Course  I have reviewed the triage vital signs and the nursing notes.  Pertinent labs & imaging results that were available  during my care of the patient were reviewed by me and considered in my medical decision making (see chart for details).     CRITICAL CARE Performed by: Lolita Patella   Total critical care time: 65 minutes  Critical care time was exclusive of separately billable procedures and treating other patients.  Critical care was necessary to treat or prevent imminent or life-threatening deterioration.  Critical care was time spent personally by me on the following activities: development of treatment plan with patient and/or surrogate as well as nursing, discussions with consultants, evaluation of patient's response to treatment, examination of patient, obtaining history from patient or surrogate, ordering and performing treatments and interventions, ordering and review of laboratory studies, ordering and review of radiographic studies, pulse oximetry and re-evaluation of patient's condition.  EKG Interpretation  Date/Time:  Monday October 06 2017 11:51:44 EDT Ventricular Rate:  64 PR Interval:  190 QRS Duration: 112 QT Interval:  346 QTC Calculation: 356 R Axis:   67 Text Interpretation:  Sinus rhythm with Premature atrial complexes Nonspecific ST and T wave abnormality Abnormal ECG Confirmed by Tanna Furry [phone]) on 10/06/2017 10:42:56 PM   Symptoms and findings consistent with COPD exacerbation.  No clinical or radiographic evidence to suggest pneumonia.  Afebrile.  No leukocytosis.  Hypoxemic requiring 4 L.  Was given nebulized albuterol x2.  Each over 1 hour.  Multiple re-evaluations show him subjectively improving but still tachypneic and requiring cannula O2.  ABG shows no CO2 retention and clinically he does not appear fatigued or confused.  I discussed the case with hospitalist.  Patient  will be admitted.  Final Clinical Impressions(s) / ED Diagnoses   Final diagnoses:  COPD exacerbation [facility])    ED Discharge Orders    None       Tanna Furry, MD 10/06/17 2243

## 2017-10-06 NOTE — Telephone Encounter (Signed)
Spoke with pt's daughter Butch Penny (dpr on file), advised that we do not have any sooner appts today, and if pt does not feel that he can wait until appt this afternoon that is already scheduled that he should be seen by UC/ED.  This was also sent in response to daughter's email sent last night.  Butch Penny expressed understanding.  Will close encounter.

## 2017-10-06 NOTE — ED Notes (Signed)
EKG done in triage, hardcopy available. Did not transmit in MUSE 

## 2017-10-06 NOTE — ED Triage Notes (Signed)
Pt endorses shob with wheezing over the last week. Has been using breathing treatments at home with minimal relief. Audible rails in triage. Pt wears 4L [place] at home, 84% on RA in triage. 91% on 4L. Speaking in complete sentences.

## 2017-10-07 ENCOUNTER — Inpatient Hospital Stay (HOSPITAL_COMMUNITY): Payer: Medicare Other

## 2017-10-07 LAB — RESPIRATORY PANEL BY PCR
Adenovirus: NOT DETECTED
BORDETELLA PERTUSSIS-RVPCR: NOT DETECTED
CHLAMYDOPHILA PNEUMONIAE-RVPPCR: NOT DETECTED
Coronavirus 229E: NOT DETECTED
Coronavirus HKU1: NOT DETECTED
Coronavirus NL63: NOT DETECTED
Coronavirus OC43: NOT DETECTED
Influenza A: NOT DETECTED
Influenza B: NOT DETECTED
Metapneumovirus: NOT DETECTED
Mycoplasma pneumoniae: NOT DETECTED
PARAINFLUENZA VIRUS 3-RVPPCR: DETECTED — AB
Parainfluenza Virus 1: NOT DETECTED
Parainfluenza Virus 2: NOT DETECTED
Parainfluenza Virus 4: NOT DETECTED
RESPIRATORY SYNCYTIAL VIRUS-RVPPCR: NOT DETECTED
RHINOVIRUS / ENTEROVIRUS - RVPPCR: NOT DETECTED

## 2017-10-07 LAB — COMPREHENSIVE METABOLIC PANEL
ALBUMIN: 3 g/dL — AB (ref 3.5–5.0)
ALT: 20 U/L (ref 17–63)
ANION GAP: 14 (ref 5–15)
AST: 30 U/L (ref 15–41)
Alkaline Phosphatase: 83 U/L (ref 38–126)
BUN: 31 mg/dL — ABNORMAL HIGH (ref 6–20)
CHLORIDE: 92 mmol/L — AB (ref 101–111)
CO2: 25 mmol/L (ref 22–32)
Calcium: 9 mg/dL (ref 8.9–10.3)
Creatinine, Ser: 1.71 mg/dL — ABNORMAL HIGH (ref 0.61–1.24)
GFR calc non Af Amer: 33 mL/min — ABNORMAL LOW (ref >60)
GFR, EST AFRICAN AMERICAN: 39 mL/min — AB (ref >60)
GLUCOSE: 375 mg/dL — AB (ref 65–99)
POTASSIUM: 4.5 mmol/L (ref 3.5–5.1)
SODIUM: 131 mmol/L — AB (ref 135–145)
Total Bilirubin: 0.6 mg/dL (ref 0.3–1.2)
Total Protein: 6.2 g/dL — ABNORMAL LOW (ref 6.5–8.1)

## 2017-10-07 LAB — CBC WITH DIFFERENTIAL/PLATELET
BASOS PCT: 0 %
Basophils Absolute: 0 10*3/uL (ref 0.0–0.1)
EOS ABS: 0 10*3/uL (ref 0.0–0.7)
EOS PCT: 0 %
HCT: 40.2 % (ref 39.0–52.0)
Hemoglobin: 13.4 g/dL (ref 13.0–17.0)
LYMPHS ABS: 0.6 10*3/uL — AB (ref 0.7–4.0)
Lymphocytes Relative: 3 %
MCH: 29.8 pg (ref 26.0–34.0)
MCHC: 33.3 g/dL (ref 30.0–36.0)
MCV: 89.5 fL (ref 78.0–100.0)
MONO ABS: 0.4 10*3/uL (ref 0.1–1.0)
MONOS PCT: 3 %
Neutro Abs: 16.1 10*3/uL — ABNORMAL HIGH (ref 1.7–7.7)
Neutrophils Relative %: 94 %
PLATELETS: 218 10*3/uL (ref 150–400)
RBC: 4.49 MIL/uL (ref 4.22–5.81)
RDW: 14.9 % (ref 11.5–15.5)
WBC: 17.1 10*3/uL — ABNORMAL HIGH (ref 4.0–10.5)

## 2017-10-07 LAB — CBG MONITORING, ED
GLUCOSE-CAPILLARY: 435 mg/dL — AB (ref 65–99)
Glucose-Capillary: 377 mg/dL — ABNORMAL HIGH (ref 65–99)

## 2017-10-07 LAB — GLUCOSE, CAPILLARY
GLUCOSE-CAPILLARY: 462 mg/dL — AB (ref 65–99)
Glucose-Capillary: 347 mg/dL — ABNORMAL HIGH (ref 65–99)

## 2017-10-07 LAB — TSH: TSH: 0.797 u[IU]/mL (ref 0.350–4.500)

## 2017-10-07 LAB — BRAIN NATRIURETIC PEPTIDE: B Natriuretic Peptide: 77.5 pg/mL (ref 0.0–100.0)

## 2017-10-07 MED ORDER — INSULIN DETEMIR 100 UNIT/ML ~~LOC~~ SOLN
10.0000 [IU] | Freq: Every day | SUBCUTANEOUS | Status: DC
  Filled 2017-10-07 (×2): qty 0.1

## 2017-10-07 MED ORDER — INSULIN DETEMIR 100 UNIT/ML ~~LOC~~ SOLN
14.0000 [IU] | Freq: Every morning | SUBCUTANEOUS | Status: DC
  Administered 2017-10-07: 14 [IU] via SUBCUTANEOUS
  Filled 2017-10-07: qty 0.14

## 2017-10-07 MED ORDER — INSULIN DETEMIR 100 UNIT/ML ~~LOC~~ SOLN
18.0000 [IU] | Freq: Every morning | SUBCUTANEOUS | Status: DC
  Administered 2017-10-08: 18 [IU] via SUBCUTANEOUS
  Filled 2017-10-07: qty 0.18

## 2017-10-07 MED ORDER — FLUTICASONE FUROATE-VILANTEROL 100-25 MCG/INH IN AEPB
1.0000 | INHALATION_SPRAY | Freq: Every day | RESPIRATORY_TRACT | Status: DC
  Administered 2017-10-08 – 2017-10-09 (×2): 1 via RESPIRATORY_TRACT
  Filled 2017-10-07: qty 28

## 2017-10-07 MED ORDER — FUROSEMIDE 40 MG PO TABS
40.0000 mg | ORAL_TABLET | Freq: Two times a day (BID) | ORAL | Status: DC
  Administered 2017-10-07 – 2017-10-09 (×4): 40 mg via ORAL
  Filled 2017-10-07 (×4): qty 1

## 2017-10-07 MED ORDER — INSULIN DETEMIR 100 UNIT/ML ~~LOC~~ SOLN
15.0000 [IU] | Freq: Every day | SUBCUTANEOUS | Status: DC
  Administered 2017-10-07: 15 [IU] via SUBCUTANEOUS
  Filled 2017-10-07: qty 0.15

## 2017-10-07 MED ORDER — POLYVINYL ALCOHOL 1.4 % OP SOLN
1.0000 [drp] | Freq: Every day | OPHTHALMIC | Status: DC
Start: 2017-10-07 — End: 2017-10-09
  Administered 2017-10-08 – 2017-10-09 (×2): 1 [drp] via OPHTHALMIC
  Filled 2017-10-07: qty 15

## 2017-10-07 MED ORDER — INSULIN DETEMIR 100 UNIT/ML ~~LOC~~ SOLN
14.0000 [IU] | Freq: Every day | SUBCUTANEOUS | Status: DC
  Filled 2017-10-07: qty 0.14

## 2017-10-07 MED ORDER — UMECLIDINIUM BROMIDE 62.5 MCG/INH IN AEPB
1.0000 | INHALATION_SPRAY | Freq: Every day | RESPIRATORY_TRACT | Status: DC
  Administered 2017-10-07 – 2017-10-08 (×2): 1 via RESPIRATORY_TRACT
  Filled 2017-10-07: qty 7

## 2017-10-07 MED ORDER — INSULIN ASPART 100 UNIT/ML ~~LOC~~ SOLN
8.0000 [IU] | Freq: Once | SUBCUTANEOUS | Status: AC
  Administered 2017-10-07: 8 [IU] via SUBCUTANEOUS

## 2017-10-07 NOTE — Progress Notes (Signed)
Inpatient Diabetes Program Recommendations  AACE/ADA: New Consensus Statement on Inpatient Glycemic Control (2015)  Target Ranges:  Prepandial:   less than 140 mg/dL      Peak postprandial:   less than 180 mg/dL (1-2 hours)      Critically ill patients:  140 - 180 mg/dL   Lab Results  Component Value Date   GLUCAP 435 (H) 10/07/2017   HGBA1C 8.2 (H) 09/16/2017    Review of Glycemic Control Results for [name], GUDE (MRN [id number]) as of 10/07/2017 11:04  Ref. Range 09/16/2017 12:37 09/16/2017 16:09 10/06/2017 23:07 10/07/2017 08:15  Glucose-Capillary Latest Ref Range: 65 - 99 mg/dL 363 (H) 285 (H) 497 (H) 435 (H)   Diabetes history: Type 2 DM Outpatient Diabetes medications: Levemir 10-14 units QD, Humalog 6-7 units TID, Metformin 500 mg BID Current orders for Inpatient glycemic control: Levemir 14 units QAM, 10 units QPM, Novolog 0-9 units TID, Solumedrol 60 mg TID  Inpatient Diabetes Program Recommendations:    In setting of steroids, may need to consider increasing correction to Novolog 0-20 units TID, adding Novolog 0-5 units QHS and Novolog 4 units TID (assuming patient consumes >50% of meal). May need to be adjusted once steroids begin tapering.   Additionally, would not recommend Metformin at discharge given age and GFR.  Thanks, Bronson Curb, MSN, RNC-OB Diabetes Coordinator [phone] (8a-5p)

## 2017-10-07 NOTE — ED Notes (Signed)
Respiratory Michelle at bedside, placed home nasal CPAP and admin treatment in line, pt w/ decreased work of breathing and less tachypneic at this time. Pt reports less SOB than on arrival. LS remains coarse w/ wheezes.

## 2017-10-07 NOTE — ED Notes (Signed)
Pt's CBG result was 435. Informed Luellen Pucker - RN.

## 2017-10-07 NOTE — Evaluation (Signed)
Clinical/Bedside Swallow Evaluation Patient Details  Name: [name] MRN: [id number] Date of Birth: 03-28-1925  Today's Date: 10/07/2017 Time: SLP Start Time (ACUTE ONLY): 1022 SLP Stop Time (ACUTE ONLY): 1046 SLP Time Calculation (min) (ACUTE ONLY): 24 min  Past Medical History:  Past Medical History:  Diagnosis Date  . Acute diastolic CHF (congestive heart failure) ([place])   . Allergic conjunctivitis   . Allergic rhinitis   . Atrial fibrillation ([employer])   . BPH (benign prostatic hyperplasia)   . Cataract   . Cellulitis    hx  . COPD (chronic obstructive pulmonary disease) ([place])       . Diabetic peripheral neuropathy ([place])   . Diverticulosis    hx  . DJD (degenerative joint disease)    Left Knee  . DVT (deep venous thrombosis) ([place])    IN RIGHT ARM 11/2010   . Hearing loss   . Hyperlipidemia   . Hypertension   . Iron deficiency anemia 03/27/2016  . Iron deficiency anemia due to chronic blood loss 03/27/2016  . Lung cancer ([place])     history of stage IIIA non-small cell lung cancer who is currently being treated with both  Radiation and Chemotherapy  . Malabsorption of iron 03/27/2016  . Obstructive sleep apnea (adult)  10/29/2012   This patient has mild obstructive sleep apnea, tested in March 2014 at [facility] sleep. He had been a CPAP user for 14 years. AHi 10.7 He was titrated to a pressure of 9 cm water( after auto - titration). CPAP at night  --- 8-10 YR AGO....,OSA-diagnosed 2000  . Pneumonia    history  . Secondary diabetes with peripheral neuropathy ([place]) 11/07/2012   Past Surgical History:  Past Surgical History:  Procedure Laterality Date  . CARDIAC CATHETERIZATION  2006  . deviated septum repair    . FIBEROPTIC BRONCHOSCOPY  12/11/2010  . Insertion of a left subclavian Port-A-Cath.  12/17/10   [place]  . PORTACATH PLACEMENT  04/23/2011   Procedure: INSERTION PORT-A-CATH;  Surgeon: Pierre Bali, MD;  Location: [facility];  Service: Thoracic;;  Revision of   Porta-Cath  . VIDEO BRONCHOSCOPY  09/27/2011   Procedure: VIDEO BRONCHOSCOPY;  Surgeon: Nicanor Alcon, MD;  Location: [facility] OR;  Service: Thoracic;  Laterality: N/A;   HPI:  [name] is a 84 y.o. male with medical history significant of right lung cancer (s/p of radiation and chemo), COPD with chronic hypoxic respiratory failure on 4L home O2, dCHF, A fib on Eliquis, and insulin-dependent diabetes mellitus,GERD, hypothyroidism, BPH, DVT, OSA on CPAP, who presents with shortness of breath. Found to have Acute on chronic respiratory failure with hypoxia due to COPD exacerbation: patient has productive cough, diffused wheezing on auscultation, no pulmonary edema or infiltration on chest x-ray, consistent with COPD exacerbation per MD.    Assessment / Plan / Recommendation Clinical Impression  Pt demonstrates normal swallow function, no signs of aspiration with 3 oz water test. Advised pt of basic precautions related to swallowing and COPD. No SLP f/u needed, pt may continue diet.       Aspiration Risk       Diet Recommendation Regular;Thin liquid        Other  Recommendations     Follow up Recommendations        Frequency and Duration            Prognosis        Swallow Study   General HPI: [name] is a 84  y.o. male with medical history significant of right lung cancer (s/p of radiation and chemo), COPD with chronic hypoxic respiratory failure on 4L home O2, dCHF, A fib on Eliquis, and insulin-dependent diabetes mellitus,GERD, hypothyroidism, BPH, DVT, OSA on CPAP, who presents with shortness of breath. Found to have Acute on chronic respiratory failure with hypoxia due to COPD exacerbation: patient has productive cough, diffused wheezing on auscultation, no pulmonary edema or infiltration on chest x-ray, consistent with COPD exacerbation per MD.  Type of Study: Bedside Swallow Evaluation Previous Swallow Assessment: none Diet Prior to this Study: Regular;Thin  liquids Temperature Spikes Noted: No Respiratory Status: Nasal cannula History of Recent Intubation: No Behavior/Cognition: Alert;Cooperative;Pleasant mood Oral Cavity Assessment: Within Functional Limits Oral Care Completed by SLP: No Oral Cavity - Dentition: Adequate natural dentition Vision: Functional for self-feeding Self-Feeding Abilities: Able to feed self Patient Positioning: Upright in bed Baseline Vocal Quality: Normal Volitional Cough: Strong Volitional Swallow: Able to elicit    Oral/Motor/Sensory Function     Ice Chips     Thin Liquid Thin Liquid: Within functional limits Presentation: Cup;Straw;Self Fed    Nectar Thick Nectar Thick Liquid: Not tested   Honey Thick Honey Thick Liquid: Not tested   Puree Puree: Within functional limits Presentation: Self Fed;Spoon   Solid   GO   Solid: Within functional limits        Verlyn Dannenberg, Katherene Ponto 10/07/2017,10:50 AM

## 2017-10-07 NOTE — ED Notes (Signed)
Pt arrives to floor w/ significant work of breathing, resps 35-45/minute. Diffuse wheezes in all four fields and tripoding after standing and transferring. O2 increased to 5LPM, pt satting 90%. Encouraged deep breathing. Respiratory alerted and en route to bedside.

## 2017-10-07 NOTE — ED Triage Notes (Signed)
Received  call from Diabetic Cor. And the note concerning increased in Levemir  Was noted by above person.

## 2017-10-07 NOTE — ED Notes (Signed)
Home nasal CPAP removed per pt request, pt placed on 4L [employer]. Satting well

## 2017-10-07 NOTE — ED Notes (Signed)
Pt's CBG result was 377. Informed Cindy - RN.

## 2017-10-07 NOTE — Progress Notes (Signed)
Patient received from ED, A&Ox4, VSS. Telemetry applied and CCMD notified. Oriented to room and call light within reach. Denies any needs at this time.

## 2017-10-07 NOTE — ED Triage Notes (Signed)
Only gave one unit of levemear

## 2017-10-07 NOTE — Progress Notes (Signed)
Pt placed on Cpap on 5C floor neb tx given before placed.  Nasal mask on pt and 5lpm bled in oxygen in place pt tolerating well daughter at bedside

## 2017-10-07 NOTE — Progress Notes (Deleted)
PHARMACY NOTE:  ANTIMICROBIAL RENAL DOSAGE ADJUSTMENT  Current antimicrobial regimen includes a mismatch between antimicrobial dosage and estimated renal function.  As per policy approved by the Pharmacy & Therapeutics and Medical Executive Committees, the antimicrobial dosage will be adjusted accordingly.  Current antimicrobial dosage:  levaquin 500mg  po q24h  Indication: COPD  Renal Function:  Estimated Creatinine Clearance: 32.5 mL/min (A) (by C-G formula based on SCr of 1.71 mg/dL (H)).     Antimicrobial dosage has been changed to:  levaquin 250mg  po q24h    Thank you for allowing pharmacy to be a part of this patient's care.  Dareen Piano, [facility] 10/07/2017 5:21 PM

## 2017-10-07 NOTE — Plan of Care (Signed)
  Problem: Clinical Measurements: Goal: Respiratory complications will improve Outcome: Progressing   Problem: Clinical Measurements: Goal: Ability to maintain clinical measurements within normal limits will improve Outcome: Progressing

## 2017-10-07 NOTE — Progress Notes (Signed)
PROGRESS NOTE  [name] JSH:[id number] DOB: [birth year] DOA: 10/06/2017 PCP: Prince Solian, MD Radiologist Dr. Wynonia Lawman Pulmonologist Dr. Lamonte Sakai  Brief Narrative:  89 OSA on CPAP,  adenocarcinoma RML Rx chemo and XRT, DVT 2012,  type II DM, HTN,  COPD-stage III-IV gold on oxygen 3 L at rest-5 L with exertion A. fib RVR on amiodarone-Chad2Vasc-4 on eliquis HLD Hypothyroid    Assessment/Plan  Acute on chronic respiratory failure with hypoxia due to COPD exacerbation:  recently admission, patient had negative flu PCR. -Nebulizers: scheduled Duoneb and prn albuterol -Solu-Medrol 60 mg IV tid--need a slow taper -let pt complete 7-day course of levaquin (3 doses left) -Mucinex for cough  -Incentive spirometry -Urine S. pneumococcal antigen -Follow up-respiratory virus panel -Nasal cannula oxygen as neededto maintain O2 saturation 92% or greater  Note that patient is on amiodarone, has a history of lung cancer status post radiation and also has other issues that may predispose to worsening respiratory status I spoke to cardiology Dr. Wynonia Lawman who recommended not changing amiodarone because historically patient's rhythm control has been difficult and specifically did not think that digoxin was a good idea either given patient's advanced age  I will courtesy CC Dr. Lamonte Sakai to let him know that the patient was admitted as patient's family had called their office yesterday  Chronic dCHF: 2-D echo on 11/07/12 showed EF of 50-55 percent. Patient does not have leg edema JVD. -Continue home dose Lasix, 40 mg twice a day -BNP is not elevated arguing against heart failure  Type II diabetes mellitus with renal manifestations ([place]): Last A1c 8.2 on 09/16/17, poorly controled. Patient is taking Levemir, Humalog, metformin at home -Insulin on admission was initially decreased however has had historical sugars in the 400s therefore  escalate a.m. 18 units p.m. 15 units Levemir with sensitive  sliding coverage  History of DVT of right axillary vein: -On eliquis  OSA CPAPnightly.  Hypertension: -Cardizem -IV hydralazine when necessary  Paroxysmal atrial fibrillation: CHA2DS2VASC4 -Continuedon home regimen ofamiodarone and Cardizem for rate control. Eliquis for anticoagulation.  Hyperlipidemia -Continued onstatin.  History of stage III adenocarcinoma of the right lung: Status post radiation and chemotherapy in the past.  -f/u with oncology as an outpatient  Hypothyroidism: TSH was 5.649 on 02/08/15 -Continuedon home regimen ofSynthroid. -TSH on admission was within normal limits  GERD: -Protonix      DVT prophylaxis: On Eliquis therapeutic Code Status: Full code Family Communication: Discussed with children in addition to daughter-in-law who is a Designer, jewellery Disposition Plan: Inpatient pending resolution  Verneita Griffes, MD [facility] [phone]   10/07/2017, 11:10 AM  LOS: 1 day   Consultants:  Telephone consulted Dr. 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 Does also have seasonal allergies which she thinks precipitated his last episode and when he was changed from doxycycline to Levaquin he improved marginally No chest pain, - fever, - chills, - abdominal pain, - blurred vision, - unilateral but overall weaknesses +  Objective: Vitals:  Vitals:   10/07/17 0830 10/07/17 0845  BP: 100/61 111/68  Pulse: 78 76  Resp: 18 15  Temp:    SpO2: 93% 95%    Exam:  EOMI, NCAT, moderate dentition, no JVD, Mallampati 2 S1-S2 sinus rhythm monitors  reviewed-heart rate 6070s-holosystolic murmur 3/6 LUSB Rales bilaterally posteriorly + wheeze Abdomen soft no rebound no  guarding Lower extremity skin not swollen no other lesions MSK range of motion intact without deficit Neurologically intact smile symmetric power major muscle groups 5/5 reflexes deferred Psych-euthymic and congruent   I have personally reviewed the following:   Labs-TSH-be met-x-ray BNP  Test discussed with performing physician:  Discussed care with Dr. Wynonia Lawman  Decision to obtain old records:  Reviewed  Review and summation of old records:  Reviewed extensively old records and office notes  Scheduled Meds: . apixaban  5 mg Oral BID  . atorvastatin  20 mg Oral q1800  . diltiazem  120 mg Oral Daily  . fluticasone  2 spray Each Nare Daily  . fluticasone furoate-vilanterol  1 puff Inhalation Daily   Or  . umeclidinium bromide  1 puff Inhalation Daily  . furosemide  40 mg Oral See admin instructions  . insulin aspart  0-9 Units Subcutaneous TID WC  . insulin detemir  10 Units Subcutaneous QHS  . insulin detemir  14 Units Subcutaneous q morning - 10a  . ipratropium-albuterol  3 mL Nebulization Q4H  . levofloxacin  500 mg Oral Daily  . levothyroxine  25 mcg Oral QAC breakfast  . loratadine  10 mg Oral Daily  . methylPREDNISolone (SOLU-MEDROL) injection  60 mg Intravenous TID  . oxybutynin  5 mg Oral QHS  . pantoprazole  40 mg Oral Q0600  . polyvinyl alcohol  1 drop Both Eyes Daily   Continuous Infusions:  Principal Problem:   Acute on chronic respiratory failure with hypoxia (HCC) Active Problems:   History of DVT of right axillary vein   Hypertension   Hyperlipidemia   Paroxysmal atrial fibrillation ([place])   Long term current use of anticoagulant therapy   OSA on CPAP   Type II diabetes mellitus with renal manifestations (H[facility])   COPD exacerbation ([facility])   Malignant neoplasm of hilus of right lung (HCC)   Chronic diastolic (congestive) heart failure (HCC)   GERD (gastroesophageal reflux disease)   Hypothyroidism   LOS: 1 day

## 2017-10-08 DIAGNOSIS — J9621 Acute and chronic respiratory failure with hypoxia: Secondary | ICD-10-CM

## 2017-10-08 LAB — GLUCOSE, CAPILLARY
GLUCOSE-CAPILLARY: 255 mg/dL — AB (ref 65–99)
Glucose-Capillary: 266 mg/dL — ABNORMAL HIGH (ref 65–99)
Glucose-Capillary: 304 mg/dL — ABNORMAL HIGH (ref 65–99)
Glucose-Capillary: 344 mg/dL — ABNORMAL HIGH (ref 65–99)

## 2017-10-08 LAB — BASIC METABOLIC PANEL
Anion gap: 11 (ref 5–15)
BUN: 31 mg/dL — ABNORMAL HIGH (ref 6–20)
CALCIUM: 9.1 mg/dL (ref 8.9–10.3)
CO2: 31 mmol/L (ref 22–32)
CREATININE: 1.5 mg/dL — AB (ref 0.61–1.24)
Chloride: 93 mmol/L — ABNORMAL LOW (ref 101–111)
GFR, EST AFRICAN AMERICAN: 45 mL/min — AB (ref >60)
GFR, EST NON AFRICAN AMERICAN: 39 mL/min — AB (ref >60)
Glucose, Bld: 243 mg/dL — ABNORMAL HIGH (ref 65–99)
Potassium: 4.1 mmol/L (ref 3.5–5.1)
SODIUM: 135 mmol/L (ref 135–145)

## 2017-10-08 LAB — CBC WITH DIFFERENTIAL/PLATELET
BASOS PCT: 0 %
Basophils Absolute: 0 10*3/uL (ref 0.0–0.1)
Eosinophils Absolute: 0 10*3/uL (ref 0.0–0.7)
Eosinophils Relative: 0 %
HEMATOCRIT: 38.4 % — AB (ref 39.0–52.0)
Hemoglobin: 12.8 g/dL — ABNORMAL LOW (ref 13.0–17.0)
Lymphocytes Relative: 3 %
Lymphs Abs: 0.6 10*3/uL — ABNORMAL LOW (ref 0.7–4.0)
MCH: 29.4 pg (ref 26.0–34.0)
MCHC: 33.3 g/dL (ref 30.0–36.0)
MCV: 88.1 fL (ref 78.0–100.0)
MONO ABS: 0.4 10*3/uL (ref 0.1–1.0)
Monocytes Relative: 2 %
NEUTROS ABS: 16.5 10*3/uL — AB (ref 1.7–7.7)
NEUTROS PCT: 95 %
Platelets: 190 10*3/uL (ref 150–400)
RBC: 4.36 MIL/uL (ref 4.22–5.81)
RDW: 14.6 % (ref 11.5–15.5)
WBC: 17.5 10*3/uL — ABNORMAL HIGH (ref 4.0–10.5)

## 2017-10-08 MED ORDER — SALINE SPRAY 0.65 % NA SOLN
1.0000 | NASAL | Status: DC | PRN
  Administered 2017-10-08: 1 via NASAL
  Filled 2017-10-08: qty 44

## 2017-10-08 MED ORDER — INSULIN DETEMIR 100 UNIT/ML ~~LOC~~ SOLN
20.0000 [IU] | Freq: Two times a day (BID) | SUBCUTANEOUS | Status: DC
  Administered 2017-10-08: 20 [IU] via SUBCUTANEOUS
  Filled 2017-10-08 (×2): qty 0.2

## 2017-10-08 MED ORDER — IPRATROPIUM-ALBUTEROL 0.5-2.5 (3) MG/3ML IN SOLN
3.0000 mL | Freq: Three times a day (TID) | RESPIRATORY_TRACT | Status: DC
  Administered 2017-10-08: 3 mL via RESPIRATORY_TRACT
  Filled 2017-10-08: qty 3

## 2017-10-08 MED ORDER — IPRATROPIUM-ALBUTEROL 0.5-2.5 (3) MG/3ML IN SOLN
3.0000 mL | Freq: Three times a day (TID) | RESPIRATORY_TRACT | Status: DC
  Administered 2017-10-09: 3 mL via RESPIRATORY_TRACT
  Filled 2017-10-08: qty 3

## 2017-10-08 MED ORDER — INSULIN ASPART 100 UNIT/ML ~~LOC~~ SOLN
5.0000 [IU] | Freq: Three times a day (TID) | SUBCUTANEOUS | Status: DC
  Administered 2017-10-08 – 2017-10-09 (×3): 5 [IU] via SUBCUTANEOUS

## 2017-10-08 MED ORDER — IPRATROPIUM-ALBUTEROL 0.5-2.5 (3) MG/3ML IN SOLN
3.0000 mL | Freq: Four times a day (QID) | RESPIRATORY_TRACT | Status: DC
  Administered 2017-10-08 (×2): 3 mL via RESPIRATORY_TRACT
  Filled 2017-10-08 (×2): qty 3

## 2017-10-08 NOTE — Discharge Instructions (Signed)

## 2017-10-08 NOTE — Progress Notes (Signed)
Patient placed on CPAP by RN. Patient tolerating well at this time. RT will continue to monitor.

## 2017-10-08 NOTE — Progress Notes (Signed)
Patient ambulated to nurses station and back on 3L O2 with walker assistance. O2 sats ranged from 93-96%. Tolerated well.

## 2017-10-08 NOTE — Plan of Care (Signed)
  Problem: Education: Goal: Knowledge of General Education information will improve Outcome: Progressing   Problem: Health Behavior/Discharge Planning: Goal: Ability to manage health-related needs will improve Outcome: Progressing   Problem: Clinical Measurements: Goal: Ability to maintain clinical measurements within normal limits will improve Outcome: Progressing   

## 2017-10-08 NOTE — Progress Notes (Signed)
Inpatient Diabetes Program Recommendations  AACE/ADA: New Consensus Statement on Inpatient Glycemic Control (2015)  Target Ranges:  Prepandial:   less than 140 mg/dL      Peak postprandial:   less than 180 mg/dL (1-2 hours)      Critically ill patients:  140 - 180 mg/dL   Results for [name], [name] (MRN [id number]) as of 10/08/2017 12:08  Ref. Range 10/07/2017 08:15 10/07/2017 12:28 10/07/2017 16:22 10/07/2017 21:01 10/08/2017 06:05 10/08/2017 11:18  Glucose-Capillary Latest Ref Range: 65 - 99 mg/dL 435 (H) 377 (H) 347 (H) 462 (H) 266 (H) 304 (H)   Review of Glycemic Control  Diabetes history: DM2 Outpatient Diabetes medications: Levemir 10-14 units daily, Humalog 6-7 units TID with meals, Metformin 500 mg BID Current orders for Inpatient glycemic control: Levemir 18 units QAM, Levemir 15 units QHS, Novolog 0-9 units TID with meals; Solumedrol 60 mg TID  Inpatient Diabetes Program Recommendations: Insulin - Basal: If steroids are continued as ordered, please consider increasing Levemir to 20 units BID. Insulin - Meal Coverage: If steroids are continued as ordered, please consider Novolog 5 units TID with meals for meal coverage if patient eats at least 50% of meals. Insulin-Correction: Please consider ordering Novolog 0-5 units QHS for bedtime correction scale.  Thanks, Barnie Alderman, RN, MSN, CDE Diabetes Coordinator Inpatient Diabetes Program [phone] (Team Pager from 8am to 5pm)

## 2017-10-08 NOTE — Progress Notes (Addendum)
PROGRESS NOTE    [name]  TDV:[id number] DOB: 01-30-1925 DOA: 10/06/2017 PCP: Prince Solian, MD   Brief Narrative:  84 year old with a history of right lung cancer status post radiation and chemo, COPD with chronic hypoxic respiratory failure on 4 L nasal cannula at home, diastolic CHF, atrial fibrillation on Eliquis, insulin-dependent diabetes mellitus, GERD, BPH, obstructive sleep apnea on CPAP came to the hospital with complains of shortness of breath.  He was initially admitted here about 3 weeks ago for COPD exacerbation and eventually discharged on doxycycline, he did well at first but over the course of days against his symptoms returned and presented again with shortness of breath.   Assessment & Plan:   Principal Problem:   Acute on chronic respiratory failure with hypoxia (HCC) Active Problems:   History of DVT of right axillary vein   Hypertension   Hyperlipidemia   Paroxysmal atrial fibrillation (HCC)   Long term current use of anticoagulant therapy   OSA on CPAP   Type II diabetes mellitus with renal manifestations (HCC)   COPD exacerbation (HCC)   Malignant neoplasm of hilus of right lung (HCC)   Chronic diastolic (congestive) heart failure (HCC)   GERD (gastroesophageal reflux disease)   Hypothyroidism  Acute on chronic hypoxic respiratory failure, 87% on room air Acute mild to moderate COPD exacerbation of mild persistent disease, slightly improving - Respiratory panel is positive for parainfluenza virus -Plans to continue and complete outpatient course of Levaquin. Last day 5/1 -Added incentive spirometry, continue nebulizer treatments scheduled and as necessary. Will make it more frequent - Mucinex as needed.  Patient has flutter valve which I encouraged use -Continue to provide supplemental oxygen. - Continue Solu-Medrol 60 mg IV 3 times daily.  Plans to de-escalate tomorrow  Diabetes mellitus type 2 - Levemir increased to 20 units bid, Novolog 5  units premeals and ISS  History of paroxysmal atrial fibrillation and deep vein thrombosis -Continue Eliquis 5 mg  Hyperlipidemia -Continue atorvastatin 20 mg  Essential hypertension -Continue Lasix 40 mg twice daily, Cardizem 120 mg daily  History of lung cancer status post chemo and radiation; Adenocarcinoma of Right Lung  Hypothyroidism -Continue Synthroid 25 mcg  OSA - CPAP at bedtime   DVT prophylaxis: Eliquis  Code Status: Full code Family Communication: Wife at bedside Disposition Plan: Maintain inpatient stay for another 24-48 hours for more IV steroids  Consultants:   None  Procedures:   None  Antimicrobials:   Levaquin-finished 5/1   Subjective: States he feels a little better today, no other acute events overnight.  Review of Systems Otherwise negative except as per HPI, including: General: Denies fever, chills, night sweats or unintended weight loss. Resp: Denies cough, wheezing, shortness of breath. Cardiac: Denies chest pain, palpitations, orthopnea, paroxysmal nocturnal dyspnea. GI: Denies abdominal pain, nausea, vomiting, diarrhea or constipation GU: Denies dysuria, frequency, hesitancy or incontinence MS: Denies muscle aches, joint pain or swelling Neuro: Denies headache, neurologic deficits (focal weakness, numbness, tingling), abnormal gait Psych: Denies anxiety, depression, SI/HI/AVH Skin: Denies new rashes or lesions ID: Denies sick contacts, exotic exposures, travel  Objective: Vitals:   10/07/17 2353 10/08/17 0300 10/08/17 0725 10/08/17 0800  BP: 100/64 103/70  109/67  Pulse: 64 66 65 64  Resp: 18 20 20 20   Temp: (!) 97.5 F (36.4 C) (!) 97.4 F (36.3 C)  97.8 F (36.6 C)  TempSrc: Oral Oral  Oral  SpO2: 92% 94% 96% 93%  Weight:      Height:  Intake/Output Summary (Last 24 hours) at 10/08/2017 1132 Last data filed at 10/07/2017 2300 Gross per 24 hour  Intake 480 ml  Output 650 ml  Net -170 ml   Filed Weights    10/06/17 1153  Weight: 94.8 kg (209 lb)    Examination:  General exam: Appears calm and comfortable  Respiratory system: Diffuse exp wheezing.  Cardiovascular system: S1 & S2 heard, RRR. No JVD, murmurs, rubs, gallops or clicks. No pedal edema. Gastrointestinal system: Abdomen is nondistended, soft and nontender. No organomegaly or masses felt. Normal bowel sounds heard. Central nervous system: Alert and oriented. No focal neurological deficits. Extremities: Symmetric 5 x 5 power. Skin: No rashes, lesions or ulcers Psychiatry: Judgement and insight appear normal. Mood & affect appropriate.     Data Reviewed:   CBC: Recent Labs  Lab 10/06/17 1200 10/07/17 1353 10/08/17 0404  WBC 11.3* 17.1* 17.5*  NEUTROABS  --  16.1* 16.5*  HGB 14.6 13.4 12.8*  HCT 44.1 40.2 38.4*  MCV 89.8 89.5 88.1  PLT 210 218 502   Basic Metabolic Panel: Recent Labs  Lab 10/06/17 1200 10/07/17 1353 10/08/17 0404  NA 136 131* 135  K 3.6 4.5 4.1  CL 95* 92* 93*  CO2 28 25 31   GLUCOSE 201* 375* 243*  BUN 26* 31* 31*  CREATININE 1.66* 1.71* 1.50*  CALCIUM 9.4 9.0 9.1   GFR: Estimated Creatinine Clearance: 37.1 mL/min (A) (by C-G formula based on SCr of 1.5 mg/dL (H)). Liver Function Tests: Recent Labs  Lab 10/07/17 1353  AST 30  ALT 20  ALKPHOS 83  BILITOT 0.6  PROT 6.2*  ALBUMIN 3.0*   No results for input(s): LIPASE, AMYLASE in the last 168 hours. No results for input(s): AMMONIA in the last 168 hours. Coagulation Profile: No results for input(s): INR, PROTIME in the last 168 hours. Cardiac Enzymes: No results for input(s): CKTOTAL, CKMB, CKMBINDEX, TROPONINI in the last 168 hours. BNP (last 3 results) Recent Labs    07/22/17 1627  PROBNP 122.0*   HbA1C: No results for input(s): HGBA1C in the last 72 hours. CBG: Recent Labs  Lab 10/07/17 1228 10/07/17 1622 10/07/17 2101 10/08/17 0605 10/08/17 1118  GLUCAP 377* 347* 462* 266* 304*   Lipid Profile: No results for  input(s): CHOL, HDL, LDLCALC, TRIG, CHOLHDL, LDLDIRECT in the last 72 hours. Thyroid Function Tests: Recent Labs    10/07/17 0320  TSH 0.797   Anemia Panel: No results for input(s): VITAMINB12, FOLATE, FERRITIN, TIBC, IRON, RETICCTPCT in the last 72 hours. Sepsis Labs: No results for input(s): PROCALCITON, LATICACIDVEN in the last 168 hours.  Recent Results (from the past 240 hour(s))  Respiratory Panel by PCR     Status: Abnormal   Collection Time: 10/06/17  9:19 PM  Result Value Ref Range Status   Adenovirus NOT DETECTED NOT DETECTED Final   Coronavirus 229E NOT DETECTED NOT DETECTED Final   Coronavirus HKU1 NOT DETECTED NOT DETECTED Final   Coronavirus NL63 NOT DETECTED NOT DETECTED Final   Coronavirus OC43 NOT DETECTED NOT DETECTED Final   Metapneumovirus NOT DETECTED NOT DETECTED Final   Rhinovirus / Enterovirus NOT DETECTED NOT DETECTED Final   Influenza A NOT DETECTED NOT DETECTED Final   Influenza B NOT DETECTED NOT DETECTED Final   Parainfluenza Virus 1 NOT DETECTED NOT DETECTED Final   Parainfluenza Virus 2 NOT DETECTED NOT DETECTED Final   Parainfluenza Virus 3 DETECTED (A) NOT DETECTED Final   Parainfluenza Virus 4 NOT DETECTED NOT DETECTED Final  Respiratory Syncytial Virus NOT DETECTED NOT DETECTED Final   Bordetella pertussis NOT DETECTED NOT DETECTED Final   Chlamydophila pneumoniae NOT DETECTED NOT DETECTED Final   Mycoplasma pneumoniae NOT DETECTED NOT DETECTED Final         Radiology Studies: Dg Chest 2 View  Result Date: 10/07/2017 CLINICAL DATA:  Shortness of breath EXAM: CHEST - 2 VIEW COMPARISON:  October 06, 2017 and September 13, 2017 FINDINGS: Lungs are somewhat hyperexpanded. There is a minimal right pleural effusion. There is no edema or consolidation. There is atelectatic change in the posterior base regions. Heart is upper normal in size with pulmonary vascularity within normal limits. There is aortic atherosclerosis. No evident adenopathy.  Port-A-Cath tip is in the superior vena cava. No pneumothorax. There is degenerative change in the thoracic spine. IMPRESSION: Underlying hyperexpansion. Small right pleural effusion. Bibasilar atelectasis posteriorly. No frank edema or consolidation. Stable cardiac silhouette. There is aortic atherosclerosis. Aortic Atherosclerosis (ICD10-I70.0). Electronically Signed   By: Lowella Grip III M.D.   On: 10/07/2017 09:37   Dg Chest 2 View  Result Date: 10/06/2017 CLINICAL DATA:  Several month history of shortness of breath with increased severity today. History of COPD, lung malignancy, diabetes, former smoker. EXAM: CHEST - 2 VIEW COMPARISON:  PA and lateral chest x-ray of September 13, 2017 FINDINGS: The lungs remain hyperinflated. The right pleural effusion has decreased slightly. The interstitial markings of both lungs remain increased. The heart and pulmonary vascularity are normal. There is calcification in the wall of the aortic arch. The porta catheter tip projects over the midportion of the SVC. The bony thorax exhibits no acute abnormality. IMPRESSION: COPD. Slightly decreased size of the small right pleural effusion. No alveolar pneumonia nor CHF. Thoracic aortic atherosclerosis. Electronically Signed   By: David  Martinique M.D.   On: 10/06/2017 12:42        Scheduled Meds: . apixaban  5 mg Oral BID  . atorvastatin  20 mg Oral q1800  . diltiazem  120 mg Oral Daily  . fluticasone  2 spray Each Nare Daily  . fluticasone furoate-vilanterol  1 puff Inhalation Daily   Or  . umeclidinium bromide  1 puff Inhalation Daily  . furosemide  40 mg Oral BID  . insulin aspart  0-9 Units Subcutaneous TID WC  . insulin detemir  15 Units Subcutaneous QHS  . insulin detemir  18 Units Subcutaneous q morning - 10a  . ipratropium-albuterol  3 mL Nebulization TID  . levothyroxine  25 mcg Oral QAC breakfast  . loratadine  10 mg Oral Daily  . methylPREDNISolone (SOLU-MEDROL) injection  60 mg Intravenous TID    . oxybutynin  5 mg Oral QHS  . pantoprazole  40 mg Oral Q0600  . polyvinyl alcohol  1 drop Both Eyes Daily   Continuous Infusions:   LOS: 2 days    Time spent: 30 mins    Cornellius Kropp Arsenio Loader, MD [facility] Pager [phone]   If 7PM-7AM, please contact night-coverage [email or web address] Password [facility] 10/08/2017, 11:32 AM

## 2017-10-09 LAB — GLUCOSE, CAPILLARY
GLUCOSE-CAPILLARY: 323 mg/dL — AB (ref 65–99)
GLUCOSE-CAPILLARY: 378 mg/dL — AB (ref 65–99)

## 2017-10-09 MED ORDER — INSULIN DETEMIR 100 UNIT/ML ~~LOC~~ SOLN
24.0000 [IU] | Freq: Two times a day (BID) | SUBCUTANEOUS | Status: DC
  Administered 2017-10-09: 24 [IU] via SUBCUTANEOUS
  Filled 2017-10-09 (×2): qty 0.24

## 2017-10-09 MED ORDER — HEPARIN SOD (PORK) LOCK FLUSH 100 UNIT/ML IV SOLN
500.0000 [IU] | INTRAVENOUS | Status: AC | PRN
  Administered 2017-10-09: 500 [IU]

## 2017-10-09 MED ORDER — PREDNISONE 10 MG (21) PO TBPK: ORAL_TABLET | ORAL | 0 refills | Status: AC

## 2017-10-09 MED ORDER — IPRATROPIUM-ALBUTEROL 0.5-2.5 (3) MG/3ML IN SOLN: 3.0000 mL | Freq: Four times a day (QID) | RESPIRATORY_TRACT | 1 refills | Status: AC | PRN

## 2017-10-09 NOTE — Care Management Note (Signed)
Case Management Note Marvetta Gibbons RN, BSN Unit 4E-Case Manager [phone]  Patient Details  Name: TERUO STILLEY MRN: [id number] Date of Birth: [birth year]  Subjective/Objective:  Pt admitted with COPD exacerbation                   Action/Plan: PTA pt lived at home with spouse- has home 02-3L at baseline- for discharge back home today- no CM needs noted for transition home.   Expected Discharge Date:  10/09/17               Expected Discharge Plan:  Home/Self Care  In-House Referral:  NA  Discharge planning Services  CM Consult  Post Acute Care Choice:  NA Choice offered to:  NA  DME Arranged:    DME Agency:     HH Arranged:    HH Agency:     Status of Service:  Completed, signed off  If discussed at [place] of Stay Meetings, dates discussed:    Discharge Disposition: home/self care   Additional Comments:  Dawayne Patricia, RN 10/09/2017, 11:34 AM

## 2017-10-09 NOTE — Plan of Care (Signed)

## 2017-10-09 NOTE — Progress Notes (Signed)
SATURATION QUALIFICATIONS: (This note is used to comply with regulatory documentation for home oxygen)  Patient Saturations on Room Air at Rest = 87%  Patient Saturations on Room Air while Ambulating  = 85%  Patient Saturations on 4 Liters of oxygen while Ambulating = 88-90%  Please briefly explain why patient needs home oxygen: Patient has lengthy recovery period on 4L to get to 95% after short period of activity. Patient maintains saturation of 94% on 3L at rest.

## 2017-10-09 NOTE — Progress Notes (Signed)
Pt discharged home with family. Port deaccessed by IV team. Pt and pt's daughter received discharge instructions and all questions were answered. Pt discharged with home O2 and all other personal belongings. Pt discharged via wheelchair and was accompanied by a Wilfrid Lund.  Ara Kussmaul BSN, RN

## 2017-10-09 NOTE — Discharge Summary (Signed)
Physician Discharge Summary  [name] UXL:[id number] DOB: 1924/08/05 DOA: 10/06/2017  PCP: Prince Solian, MD  Admit date: 10/06/2017 Discharge date: 10/09/2017  Admitted From: Home  Disposition:  Home  Recommendations for Outpatient Follow-up:  1. Follow up with PCP in 1-2 weeks 2. Please obtain BMP/CBC in one week your next doctors visit.  3. 8 Day Prednisone Taper.  4. Duoneb Prescription give, Can use it 3-4 times day over next 2-3 days and then slowly taper it off.  5. Need to follow up with outpatient [place]: None Equipment/Devices: None Discharge Condition: Stable CODE STATUS: Full  Diet recommendation: Diabetic  Brief/Interim Summary: 84 year old with a history of right lung cancer status post radiation and chemo, COPD with chronic hypoxic respiratory failure on 4 L nasal cannula at home, diastolic CHF, atrial fibrillation on Eliquis, insulin-dependent diabetes mellitus, GERD, BPH, obstructive sleep apnea on CPAP came to the hospital with complains of shortness of breath.  He was initially admitted here about 3 weeks ago for COPD exacerbation and eventually discharged on doxycycline, he did well at first but over the course of days against his symptoms returned and presented again with shortness of breath. During his hospital stay patient was treated for COPD exacerbation with steroids and nebulizer treatments.  He was also given supplemental oxygen as necessary.  His respiratory panel eventually came back positive for parainfluenza virus but rest of the work-up remain negative.  His insulin was increased while he was here due to hyperglycemia likely steroid-induced but he has been instructed that he should return back to his routine regimen outpatient and then eventually follow-up with his primary care physician for long-term management of his diabetes.  He is reached maximum benefit from an hospital stay and is stable to be discharged.  He is ambulating in the  hallway without any issues.  No complaints this morning, feels a whole lot better.  HEENT/EYES = negative for pain, redness, loss of vision, double vision, blurred vision, loss of hearing, sore throat, hoarseness, dysphagia Cardiovascular= negative for chest pain, palpitation, murmurs, lower extremity swelling Respiratory/lungs= negative for shortness of breath, cough, hemoptysis, wheezing, mucus production Gastrointestinal= negative for nausea, vomiting,, abdominal pain, melena, hematemesis Genitourinary= negative for Dysuria, Hematuria, Change in Urinary Frequency MSK = Negative for arthralgia, myalgias, Back Pain, Joint swelling  Neurology= Negative for headache, seizures, numbness, tingling  Psychiatry= Negative for anxiety, depression, suicidal and homocidal ideation Allergy/Immunology= Medication/Food allergy as listed  Skin= Negative for Rash, lesions, ulcers, itching    Discharge Diagnoses:  Principal Problem:   Acute on chronic respiratory failure with hypoxia (HCC) Active Problems:   History of DVT of right axillary vein   Hypertension   Hyperlipidemia   Paroxysmal atrial fibrillation ([place])   Long term current use of anticoagulant therapy   OSA on CPAP   Type II diabetes mellitus with renal manifestations (HCC)   COPD exacerbation (HCC)   Malignant neoplasm of hilus of right lung (HCC)   Chronic diastolic (congestive) heart failure (HCC)   GERD (gastroesophageal reflux disease)   Hypothyroidism   Acute on chronic hypoxic respiratory failure, 87% on room air; resolved Acute mild to moderate COPD exacerbation of mild persistent disease, improving - Respiratory panel is positive for parainfluenza virus -Plans to continue and complete outpatient course of Levaquin. Last day 5/1 -Advised to use incentive spirometry.  Nebulizer prescription given. - Mucinex as needed.  Patient has flutter valve which I encouraged use -Continue to provide supplemental oxygen. - We will  switch Solu-Medrol to oral prednisone and tapered over 8 days.  Diabetes mellitus type 2 - Resume home regimen  History of paroxysmal atrial fibrillation and deep vein thrombosis -Continue Eliquis 5 mg  Hyperlipidemia -Continue atorvastatin 20 mg  Essential hypertension -Continue Lasix 40 mg twice daily, Cardizem 120 mg daily  History of lung cancer status post chemo and radiation; Adenocarcinoma of Right Lung  Hypothyroidism -Continue Synthroid 25 mcg  OSA - CPAP at bedtime    Discharge Instructions   Allergies as of 10/09/2017   No Known Allergies     Medication List    STOP taking these medications   levofloxacin 500 MG tablet Commonly known as:  LEVAQUIN   predniSONE 10 MG tablet Commonly known as:  DELTASONE Replaced by:  predniSONE 10 MG (21) Tbpk tablet     TAKE these medications   acetaminophen 325 MG tablet Commonly known as:  TYLENOL Take 650 mg by mouth every 4 (four) hours as needed for headache (chills/low grade fever).   amiodarone 200 MG tablet Commonly known as:  PACERONE Take 200 mg by mouth See admin instructions. Take one tablet (200 mg) by mouth on Monday, Wednesday, Friday mornings, may also take one tablet (200 mg) as needed for palpitations   apixaban 5 MG Tabs tablet Commonly known as:  ELIQUIS Take 1 tablet (5 mg total) by mouth 2 (two) times daily.   atorvastatin 20 MG tablet Commonly known as:  LIPITOR Take 20 mg by mouth at bedtime.   cetirizine 10 MG tablet Commonly known as:  ZYRTEC Take 10 mg by mouth daily.   diltiazem 120 MG 24 hr capsule Commonly known as:  CARDIZEM CD Take 120 mg by mouth daily.   fluticasone 50 MCG/ACT nasal spray Commonly known as:  FLONASE Place 2 sprays into both nostrils daily.   Fluticasone-Umeclidin-Vilant 100-62.5-25 MCG/INH Aepb Commonly known as:  TRELEGY ELLIPTA Inhale 1 puff into the lungs daily.   furosemide 40 MG tablet Commonly known as:  LASIX Take 1 tablet (40 mg  total) by mouth 2 (two) times daily. What changed:  when to take this   glucosamine-chondroitin 500-400 MG tablet Take 1 tablet by mouth 2 (two) times daily with a meal.   insulin lispro 100 UNIT/ML injection Commonly known as:  HUMALOG Inject 6-7 Units into the skin 3 (three) times daily as needed for high blood sugar (CBG >150 (150-250 6 units, >250 7 units)).   ipratropium-albuterol 0.5-2.5 (3) MG/3ML Soln Commonly known as:  DUONEB Take 3 mLs by nebulization every 6 (six) hours as needed.   LEVEMIR FLEXTOUCH 100 UNIT/ML Pen Generic drug:  Insulin Detemir Inject 22-26 Units into the skin See admin instructions. Inject 26 units subcutaneously before breakfast, inject 22 units at bedtime   levothyroxine 25 MCG tablet Commonly known as:  SYNTHROID, LEVOTHROID Take 25 mcg by mouth daily before breakfast.   metFORMIN 500 MG 24 hr tablet Commonly known as:  GLUCOPHAGE-XR Take 500 mg by mouth 2 (two) times daily after a meal.   oxybutynin 5 MG tablet Commonly known as:  DITROPAN Take 5 mg by mouth at bedtime.   pantoprazole 40 MG tablet Commonly known as:  PROTONIX Take 1 tablet (40 mg total) by mouth daily at 6 (six) AM. What changed:    when to take this  additional instructions   predniSONE 10 MG (21) Tbpk tablet Commonly known as:  STERAPRED UNI-PAK 21 TAB Take 4 tablets (40 mg total) by mouth daily for 2 days, THEN 3  tablets (30 mg total) daily for 2 days, THEN 2 tablets (20 mg total) daily for 2 days, THEN 1 tablet (10 mg total) daily for 2 days. Start taking on:  10/09/2017 Replaces:  predniSONE 10 MG tablet   PROAIR HFA 108 (90 Base) MCG/ACT inhaler Generic drug:  albuterol INHALE TWO PUFFS BY MOUTH EVERY 6 HOURS AS NEEDED FOR WHEEZING AND FOR SHORTNESS OF BREATH What changed:  Another medication with the same name was changed. Make sure you understand how and when to take each.   albuterol (2.5 MG/3ML) 0.083% nebulizer solution Commonly known as:  PROVENTIL Take  3 mLs (2.5 mg total) by nebulization 3 (three) times daily. 3 times daily times 5 days then every 6 hours as needed. What changed:  additional instructions   REFRESH OP Place 1 drop into both eyes daily as needed (dry eyes).   temazepam 15 MG capsule Commonly known as:  RESTORIL TAKE ONE CAPSULE BY MOUTH AT BEDTIME AS NEEDED FOR SLEEP What changed:    how much to take  how to take this  when to take this  additional instructions      Follow-up Information    Avva, Ravisankar, MD. Schedule an appointment as soon as possible for a visit in 1 week(s).   Specialty:  Internal Medicine Contact information: [address] [place] [address] [phone]          No Known Allergies  You were cared for by a hospitalist during your hospital stay. If you have any questions about your discharge medications or the care you received while you were in the hospital after you are discharged, you can call the unit and asked to speak with the hospitalist on call if the hospitalist that took care of you is not available. Once you are discharged, your primary care physician will handle any further medical issues. Please note that no refills for any discharge medications will be authorized once you are discharged, as it is imperative that you return to your primary care physician (or establish a relationship with a primary care physician if you do not have one) for your aftercare needs so that they can reassess your need for medications and monitor your lab values.  Consultations:  None   Procedures/Studies: Dg Chest 2 View  Result Date: 10/07/2017 CLINICAL DATA:  Shortness of breath EXAM: CHEST - 2 VIEW COMPARISON:  October 06, 2017 and September 13, 2017 FINDINGS: Lungs are somewhat hyperexpanded. There is a minimal right pleural effusion. There is no edema or consolidation. There is atelectatic change in the posterior base regions. Heart is upper normal in size with pulmonary vascularity  within normal limits. There is aortic atherosclerosis. No evident adenopathy. Port-A-Cath tip is in the superior vena cava. No pneumothorax. There is degenerative change in the thoracic spine. IMPRESSION: Underlying hyperexpansion. Small right pleural effusion. Bibasilar atelectasis posteriorly. No frank edema or consolidation. Stable cardiac silhouette. There is aortic atherosclerosis. Aortic Atherosclerosis (ICD10-I70.0). Electronically Signed   By: Lowella Grip III M.D.   On: 10/07/2017 09:37   Dg Chest 2 View  Result Date: 10/06/2017 CLINICAL DATA:  Several month history of shortness of breath with increased severity today. History of COPD, lung malignancy, diabetes, former smoker. EXAM: CHEST - 2 VIEW COMPARISON:  PA and lateral chest x-ray of September 13, 2017 FINDINGS: The lungs remain hyperinflated. The right pleural effusion has decreased slightly. The interstitial markings of both lungs remain increased. The heart and pulmonary vascularity are normal. There is calcification in  the wall of the aortic arch. The porta catheter tip projects over the midportion of the SVC. The bony thorax exhibits no acute abnormality. IMPRESSION: COPD. Slightly decreased size of the small right pleural effusion. No alveolar pneumonia nor CHF. Thoracic aortic atherosclerosis. Electronically Signed   By: David  Martinique M.D.   On: 10/06/2017 12:42   Dg Chest 2 View  Result Date: 09/13/2017 CLINICAL DATA:  More shortness of breath than usual today with productive cough. Home oxygen use. History of lung cancer, pneumonia, COPD, atrial fibrillation, former smoker. EXAM: CHEST - 2 VIEW COMPARISON:  07/29/2017 FINDINGS: Left central venous catheter with tip over the low cavoatrial junction region. No pneumothorax. Heart size and pulmonary vascularity are normal. Emphysematous changes and fibrosis in the lungs. Small right pleural effusion with basilar atelectasis, similar to previous study. No developing consolidation. No  pneumothorax. Calcified and tortuous aorta. Degenerative changes in the spine. IMPRESSION: 1. Chronic emphysematous changes and fibrosis in the lungs. 2. Right pleural effusion with basilar atelectasis, similar to prior study. 3. Aortic atherosclerosis. Electronically Signed   By: Lucienne Capers M.D.   On: 09/13/2017 21:16      Subjective:   Discharge Exam: Vitals:   10/09/17 0856 10/09/17 1225  BP:    Pulse: 71 74  Resp: (!) 24 19  Temp:    SpO2: 97% 97%   Vitals:   10/09/17 0700 10/09/17 0759 10/09/17 0856 10/09/17 1225  BP:  107/71    Pulse:  70 71 74  Resp: 20 19 (!) 24 19  Temp:  98.5 F (36.9 C)    TempSrc:  Oral    SpO2: (!) 83% 92% 97% 97%  Weight:      Height:        General: Pt is alert, awake, not in acute distress; on 2L [place] Cardiovascular: RRR, S1/S2 +, no rubs, no gallops Respiratory: slight coarse BS at the bases.  Abdominal: Soft, NT, ND, bowel sounds + Extremities: no edema, no cyanosis    The results of significant diagnostics from this hospitalization (including imaging, microbiology, ancillary and laboratory) are listed below for reference.     Microbiology: Recent Results (from the past 240 hour(s))  Culture, blood (routine x 2) Call MD if unable to obtain prior to antibiotics being given     Status: None (Preliminary result)   Collection Time: 10/06/17  9:00 PM  Result Value Ref Range Status   Specimen Description BLOOD LEFT ANTECUBITAL  Final   Special Requests   Final    BOTTLES DRAWN AEROBIC AND ANAEROBIC Blood Culture adequate volume   Culture   Final    NO GROWTH 1 DAY Performed at [facility], [address] [place], [place] [address]    Report Status PENDING  Incomplete  Respiratory Panel by PCR     Status: Abnormal   Collection Time: 10/06/17  9:19 PM  Result Value Ref Range Status   Adenovirus NOT DETECTED NOT DETECTED Final   Coronavirus 229E NOT DETECTED NOT DETECTED Final   Coronavirus HKU1 NOT DETECTED NOT DETECTED  Final   Coronavirus NL63 NOT DETECTED NOT DETECTED Final   Coronavirus OC43 NOT DETECTED NOT DETECTED Final   Metapneumovirus NOT DETECTED NOT DETECTED Final   Rhinovirus / Enterovirus NOT DETECTED NOT DETECTED Final   Influenza A NOT DETECTED NOT DETECTED Final   Influenza B NOT DETECTED NOT DETECTED Final   Parainfluenza Virus 1 NOT DETECTED NOT DETECTED Final   Parainfluenza Virus 2 NOT DETECTED NOT  DETECTED Final   Parainfluenza Virus 3 DETECTED (A) NOT DETECTED Final   Parainfluenza Virus 4 NOT DETECTED NOT DETECTED Final   Respiratory Syncytial Virus NOT DETECTED NOT DETECTED Final   Bordetella pertussis NOT DETECTED NOT DETECTED Final   Chlamydophila pneumoniae NOT DETECTED NOT DETECTED Final   Mycoplasma pneumoniae NOT DETECTED NOT DETECTED Final  Culture, blood (routine x 2) Call MD if unable to obtain prior to antibiotics being given     Status: None (Preliminary result)   Collection Time: 10/06/17  9:20 PM  Result Value Ref Range Status   Specimen Description BLOOD LEFT ARM  Final   Special Requests   Final    BOTTLES DRAWN AEROBIC AND ANAEROBIC Blood Culture adequate volume   Culture   Final    NO GROWTH 1 DAY Performed at [facility], [place] [address], [place],  [address]    Report Status PENDING  Incomplete     Labs: BNP (last 3 results) Recent Labs    10/07/17 0320  BNP 39.7   Basic Metabolic Panel: Recent Labs  Lab 10/06/17 1200 10/07/17 1353 10/08/17 0404  NA 136 131* 135  K 3.6 4.5 4.1  CL 95* 92* 93*  CO2 28 25 31   GLUCOSE 201* 375* 243*  BUN 26* 31* 31*  CREATININE 1.66* 1.71* 1.50*  CALCIUM 9.4 9.0 9.1   Liver Function Tests: Recent Labs  Lab 10/07/17 1353  AST 30  ALT 20  ALKPHOS 83  BILITOT 0.6  PROT 6.2*  ALBUMIN 3.0*   No results for input(s): LIPASE, AMYLASE in the last 168 hours. No results for input(s): AMMONIA in the last 168 hours. CBC: Recent Labs  Lab 10/06/17 1200 10/07/17 1353 10/08/17 0404  WBC 11.3*  17.1* 17.5*  NEUTROABS  --  16.1* 16.5*  HGB 14.6 13.4 12.8*  HCT 44.1 40.2 38.4*  MCV 89.8 89.5 88.1  PLT 210 218 190   Cardiac Enzymes: No results for input(s): CKTOTAL, CKMB, CKMBINDEX, TROPONINI in the last 168 hours. BNP: Invalid input(s): POCBNP CBG: Recent Labs  Lab 10/08/17 1118 10/08/17 1640 10/08/17 2055 10/09/17 0622 10/09/17 1100  GLUCAP 304* 344* 255* 323* 378*   D-Dimer No results for input(s): DDIMER in the last 72 hours. Hgb A1c No results for input(s): HGBA1C in the last 72 hours. Lipid Profile No results for input(s): CHOL, HDL, LDLCALC, TRIG, CHOLHDL, LDLDIRECT in the last 72 hours. Thyroid function studies Recent Labs    10/07/17 0320  TSH 0.797   Anemia work up No results for input(s): VITAMINB12, FOLATE, FERRITIN, TIBC, IRON, RETICCTPCT in the last 72 hours. Urinalysis    Component Value Date/Time   COLORURINE AMBER (A) 05/07/2015 1742   APPEARANCEUR CLEAR 05/07/2015 1742   LABSPEC 1.030 05/07/2015 1742   PHURINE 5.5 05/07/2015 1742   GLUCOSEU 100 (A) 05/07/2015 1742   HGBUR NEGATIVE 05/07/2015 1742   BILIRUBINUR NEGATIVE 05/07/2015 1742   KETONESUR NEGATIVE 05/07/2015 1742   PROTEINUR NEGATIVE 05/07/2015 1742   UROBILINOGEN 0.2 12/09/2010 1650   NITRITE NEGATIVE 05/07/2015 1742   LEUKOCYTESUR NEGATIVE 05/07/2015 1742   Sepsis Labs Invalid input(s): PROCALCITONIN,  WBC,  LACTICIDVEN Microbiology Recent Results (from the past 240 hour(s))  Culture, blood (routine x 2) Call MD if unable to obtain prior to antibiotics being given     Status: None (Preliminary result)   Collection Time: 10/06/17  9:00 PM  Result Value Ref Range Status   Specimen Description BLOOD LEFT ANTECUBITAL  Final   Special Requests  Final    BOTTLES DRAWN AEROBIC AND ANAEROBIC Blood Culture adequate volume   Culture   Final    NO GROWTH 1 DAY Performed at [facility], [place] [address], [place], [place] [address]    Report Status PENDING  Incomplete   Respiratory Panel by PCR     Status: Abnormal   Collection Time: 10/06/17  9:19 PM  Result Value Ref Range Status   Adenovirus NOT DETECTED NOT DETECTED Final   Coronavirus 229E NOT DETECTED NOT DETECTED Final   Coronavirus HKU1 NOT DETECTED NOT DETECTED Final   Coronavirus NL63 NOT DETECTED NOT DETECTED Final   Coronavirus OC43 NOT DETECTED NOT DETECTED Final   Metapneumovirus NOT DETECTED NOT DETECTED Final   Rhinovirus / Enterovirus NOT DETECTED NOT DETECTED Final   Influenza A NOT DETECTED NOT DETECTED Final   Influenza B NOT DETECTED NOT DETECTED Final   Parainfluenza Virus 1 NOT DETECTED NOT DETECTED Final   Parainfluenza Virus 2 NOT DETECTED NOT DETECTED Final   Parainfluenza Virus 3 DETECTED (A) NOT DETECTED Final   Parainfluenza Virus 4 NOT DETECTED NOT DETECTED Final   Respiratory Syncytial Virus NOT DETECTED NOT DETECTED Final   Bordetella pertussis NOT DETECTED NOT DETECTED Final   Chlamydophila pneumoniae NOT DETECTED NOT DETECTED Final   Mycoplasma pneumoniae NOT DETECTED NOT DETECTED Final  Culture, blood (routine x 2) Call MD if unable to obtain prior to antibiotics being given     Status: None (Preliminary result)   Collection Time: 10/06/17  9:20 PM  Result Value Ref Range Status   Specimen Description BLOOD LEFT ARM  Final   Special Requests   Final    BOTTLES DRAWN AEROBIC AND ANAEROBIC Blood Culture adequate volume   Culture   Final    NO GROWTH 1 DAY Performed at [facility], [address] [place], [place] [address]    Report Status PENDING  Incomplete     Time coordinating discharge:  I have spent 35 minutes face to face with the patient and on the ward discussing the patients care, assessment, plan and disposition with other care givers. >50% of the time was devoted counseling the patient about the risks and benefits of treatment/Discharge disposition and coordinating care.   SIGNED:   Damita Lack, MD  [facility] 10/09/2017,  12:51 PM Pager   If 7PM-7AM, please contact night-coverage [email or web address] Password [email or web address]

## 2017-10-12 LAB — CULTURE, BLOOD (ROUTINE X 2)
Culture: NO GROWTH
Culture: NO GROWTH
SPECIAL REQUESTS: ADEQUATE
Special Requests: ADEQUATE

## 2017-10-29 ENCOUNTER — Inpatient Hospital Stay: Payer: Medicare Other

## 2017-10-29 ENCOUNTER — Other Ambulatory Visit: Payer: Self-pay

## 2017-10-29 ENCOUNTER — Encounter: Payer: Self-pay | Admitting: Hematology & Oncology

## 2017-10-29 ENCOUNTER — Inpatient Hospital Stay: Payer: Medicare Other | Attending: Hematology & Oncology | Admitting: Hematology & Oncology

## 2017-10-29 VITALS — BP 112/65 | HR 88 | Temp 97.6°F | Resp 19 | Wt 219.7 lb

## 2017-10-29 DIAGNOSIS — M7989 Other specified soft tissue disorders: Secondary | ICD-10-CM | POA: Diagnosis not present

## 2017-10-29 DIAGNOSIS — R062 Wheezing: Secondary | ICD-10-CM | POA: Insufficient documentation

## 2017-10-29 DIAGNOSIS — M2550 Pain in unspecified joint: Secondary | ICD-10-CM | POA: Diagnosis not present

## 2017-10-29 DIAGNOSIS — D500 Iron deficiency anemia secondary to blood loss (chronic): Secondary | ICD-10-CM

## 2017-10-29 DIAGNOSIS — R5381 Other malaise: Secondary | ICD-10-CM | POA: Insufficient documentation

## 2017-10-29 DIAGNOSIS — D508 Other iron deficiency anemias: Secondary | ICD-10-CM | POA: Insufficient documentation

## 2017-10-29 DIAGNOSIS — R0602 Shortness of breath: Secondary | ICD-10-CM | POA: Diagnosis not present

## 2017-10-29 DIAGNOSIS — Z7901 Long term (current) use of anticoagulants: Secondary | ICD-10-CM | POA: Insufficient documentation

## 2017-10-29 DIAGNOSIS — R002 Palpitations: Secondary | ICD-10-CM | POA: Insufficient documentation

## 2017-10-29 DIAGNOSIS — K909 Intestinal malabsorption, unspecified: Secondary | ICD-10-CM | POA: Diagnosis not present

## 2017-10-29 DIAGNOSIS — I480 Paroxysmal atrial fibrillation: Secondary | ICD-10-CM | POA: Diagnosis not present

## 2017-10-29 DIAGNOSIS — Z85118 Personal history of other malignant neoplasm of bronchus and lung: Secondary | ICD-10-CM | POA: Diagnosis not present

## 2017-10-29 DIAGNOSIS — R5383 Other fatigue: Secondary | ICD-10-CM | POA: Insufficient documentation

## 2017-10-29 DIAGNOSIS — R42 Dizziness and giddiness: Secondary | ICD-10-CM | POA: Insufficient documentation

## 2017-10-29 DIAGNOSIS — J42 Unspecified chronic bronchitis: Secondary | ICD-10-CM

## 2017-10-29 DIAGNOSIS — R110 Nausea: Secondary | ICD-10-CM

## 2017-10-29 DIAGNOSIS — C3401 Malignant neoplasm of right main bronchus: Secondary | ICD-10-CM

## 2017-10-29 DIAGNOSIS — Z86718 Personal history of other venous thrombosis and embolism: Secondary | ICD-10-CM | POA: Insufficient documentation

## 2017-10-29 DIAGNOSIS — Z95828 Presence of other vascular implants and grafts: Secondary | ICD-10-CM

## 2017-10-29 DIAGNOSIS — Z9981 Dependence on supplemental oxygen: Secondary | ICD-10-CM | POA: Insufficient documentation

## 2017-10-29 LAB — CBC WITH DIFFERENTIAL (CANCER CENTER ONLY)
BASOS PCT: 0 %
Basophils Absolute: 0 10*3/uL (ref 0.0–0.1)
Eosinophils Absolute: 0.1 10*3/uL (ref 0.0–0.5)
Eosinophils Relative: 1 %
HEMATOCRIT: 41.7 % (ref 38.7–49.9)
Hemoglobin: 13.9 g/dL (ref 13.0–17.1)
Lymphocytes Relative: 13 %
Lymphs Abs: 1.2 10*3/uL (ref 0.9–3.3)
MCH: 30.3 pg (ref 28.0–33.4)
MCHC: 33.3 g/dL (ref 32.0–35.9)
MCV: 90.8 fL (ref 82.0–98.0)
MONO ABS: 0.8 10*3/uL (ref 0.1–0.9)
Monocytes Relative: 8 %
NEUTROS ABS: 7.7 10*3/uL — AB (ref 1.5–6.5)
NEUTROS PCT: 78 %
Platelet Count: 300 10*3/uL (ref 145–400)
RBC: 4.59 MIL/uL (ref 4.20–5.70)
RDW: 15.3 % (ref 11.1–15.7)
WBC: 9.8 10*3/uL (ref 4.0–10.0)

## 2017-10-29 LAB — CMP (CANCER CENTER ONLY)
ALK PHOS: 114 U/L (ref 40–150)
ALT: 18 U/L (ref 0–55)
ANION GAP: 10 (ref 3–11)
AST: 21 U/L (ref 5–34)
Albumin: 2.8 g/dL — ABNORMAL LOW (ref 3.5–5.0)
BILIRUBIN TOTAL: 0.5 mg/dL (ref 0.2–1.2)
BUN: 17 mg/dL (ref 7–26)
CALCIUM: 8.7 mg/dL (ref 8.4–10.4)
CO2: 29 mmol/L (ref 22–29)
Chloride: 96 mmol/L — ABNORMAL LOW (ref 98–109)
Creatinine: 1.26 mg/dL (ref 0.70–1.30)
GFR, EST AFRICAN AMERICAN: 56 mL/min — AB (ref >60)
GFR, Estimated: 48 mL/min — ABNORMAL LOW (ref >60)
GLUCOSE: 313 mg/dL — AB (ref 70–140)
Potassium: 3.8 mmol/L (ref 3.5–5.1)
Sodium: 135 mmol/L — ABNORMAL LOW (ref 136–145)
TOTAL PROTEIN: 6.7 g/dL (ref 6.4–8.3)

## 2017-10-29 LAB — LACTATE DEHYDROGENASE: LDH: 495 U/L — ABNORMAL HIGH (ref 125–245)

## 2017-10-29 MED ORDER — IPRATROPIUM-ALBUTEROL 0.5-2.5 (3) MG/3ML IN SOLN
3.0000 mL | Freq: Four times a day (QID) | RESPIRATORY_TRACT | Status: DC
  Administered 2017-10-29: 3 mL via RESPIRATORY_TRACT

## 2017-10-29 MED ORDER — IPRATROPIUM-ALBUTEROL 0.5-2.5 (3) MG/3ML IN SOLN
RESPIRATORY_TRACT | Status: AC
  Filled 2017-10-29: qty 3

## 2017-10-29 MED ORDER — SODIUM CHLORIDE 0.9% FLUSH
10.0000 mL | INTRAVENOUS | Status: DC | PRN
  Administered 2017-10-29: 10 mL via INTRAVENOUS
  Filled 2017-10-29: qty 10

## 2017-10-29 MED ORDER — HEPARIN SOD (PORK) LOCK FLUSH 100 UNIT/ML IV SOLN
500.0000 [IU] | Freq: Once | INTRAVENOUS | Status: AC
  Administered 2017-10-29: 500 [IU] via INTRAVENOUS
  Filled 2017-10-29: qty 5

## 2017-10-29 NOTE — Patient Instructions (Signed)

## 2017-10-29 NOTE — Progress Notes (Signed)
Hematology and Oncology Follow Up Visit  [name] [id number] [birth year] 84 y.o. 10/29/2017   Principle Diagnosis:  . 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 2. Paroxysmal atrial fibrillation. 3. 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 4. Iron deficiency anemia due to malabsorption  Current Therapy:   Eliquis 5 mg p.o. B.i.d. IV Feraheme as indicated     Interim History:  [name] is back for followup.he is oxygen dependent. He has bad underlying COPD and also cardiac issues. He has atrial fibrillation. He is on ELIQUIS.  His main problem is his breathing. He is on supplemental oxygen. He just gets short winded very quickly.  He now has nebulizers at home that he uses.  He is swelling the legs.  Somehow, he takes Lasix twice a day.  I told him to take both Lasix daily.  We did do an EKG on him.  It showed that he is now in normal sinus rhythm.  I think that an echocardiogram would not be a bad idea.  I do not think he is having for a while.  I do not see any issues with his iron.  Back for last saw him in October, his ferritin was 66 with an iron saturation of 26%.  Unfortunately, he has been hospitalized a couple times in the past couple months because of shortness of breath.  Currently, his performance status is ECOG 2.   Medications:  Current Outpatient Medications:  .  acetaminophen (TYLENOL) 325 MG tablet, Take 650 mg by mouth every 4 (four) hours as needed for headache (chills/low grade fever)., Disp: , Rfl:  .  albuterol (PROVENTIL) (2.5 MG/3ML) 0.083% nebulizer solution, Take 3 mLs (2.5 mg total) by nebulization 3 (three) times daily. 3 times daily times 5 days then every 6 hours as needed. (Patient taking differently: Take 2.5 mg by nebulization 3 (three) times daily. ), Disp: 360 mL, Rfl: 0 .  amiodarone (PACERONE) 200 MG tablet, Take 200 mg by mouth See admin instructions. Take one tablet (200 mg)  by mouth on Monday, Wednesday, Friday mornings, may also take one tablet (200 mg) as needed for palpitations, Disp: , Rfl:  .  apixaban (ELIQUIS) 5 MG TABS tablet, Take 1 tablet (5 mg total) by mouth 2 (two) times daily., Disp: 60 tablet, Rfl: 12 .  atorvastatin (LIPITOR) 20 MG tablet, Take 20 mg by mouth at bedtime. , Disp: , Rfl:  .  cetirizine (ZYRTEC) 10 MG tablet, Take 10 mg by mouth daily. , Disp: , Rfl:  .  diltiazem (CARDIZEM CD) 120 MG 24 hr capsule, Take 120 mg by mouth daily., Disp: , Rfl:  .  Fluticasone-Umeclidin-Vilant (TRELEGY ELLIPTA) 100-62.5-25 MCG/INH AEPB, Inhale 1 puff into the lungs daily., Disp: 180 each, Rfl: 3 .  furosemide (LASIX) 40 MG tablet, Take 1 tablet (40 mg total) by mouth 2 (two) times daily. (Patient taking differently: Take 40 mg by mouth 2 (two) times daily with breakfast and lunch. ), Disp: 30 tablet, Rfl: 12 .  glucosamine-chondroitin 500-400 MG tablet, Take 1 tablet by mouth 2 (two) times daily with a meal. , Disp: , Rfl:  .  glucose blood (ONETOUCH VERIO) test strip, , Disp: , Rfl:  .  Insulin Detemir (LEVEMIR FLEXTOUCH) 100 UNIT/ML Pen, Inject 22-26 Units into the skin See admin instructions. Inject 20 units subcutaneously before breakfast, inject 16 units at bedtime, reported 10/29/17., Disp: , Rfl:  .  insulin lispro (HUMALOG) 100 UNIT/ML injection, Inject 6-7 Units into the skin 3 (three) times daily as needed for high blood sugar (CBG >150 (150-250 6 units, >250 7 units)). , Disp: , Rfl:  .  ipratropium-albuterol (DUONEB) 0.5-2.5 (3) MG/3ML SOLN, Take 3 mLs by nebulization every 6 (six) hours as needed., Disp: 360 mL, Rfl: 1 .  levothyroxine (SYNTHROID, LEVOTHROID) 25 MCG tablet, Take 25 mcg by mouth daily before breakfast., Disp: , Rfl:  .  metFORMIN (GLUCOPHAGE-XR) 500 MG 24 hr tablet, Take 500 mg by mouth 2 (two) times daily after a meal. , Disp: , Rfl:  .  oxybutynin (DITROPAN) 5 MG tablet, Take 5 mg by mouth at bedtime. , Disp: , Rfl:  .  pantoprazole  (PROTONIX) 40 MG tablet, Take 1 tablet (40 mg total) by mouth daily at 6 (six) AM. (Patient taking differently: Take 40 mg by mouth See admin instructions. Take one tablet (40 mg) by mouth before breakfast - 30 minutes after levothyroxine), Disp: 30 tablet, Rfl: 0 .  Polyvinyl Alcohol-Povidone (REFRESH OP), Place 1 drop into both eyes daily as needed (dry eyes)., Disp: , Rfl:  .  PROAIR HFA 108 (90 Base) MCG/ACT inhaler, INHALE TWO PUFFS BY MOUTH EVERY 6 HOURS AS NEEDED FOR WHEEZING AND FOR SHORTNESS OF BREATH, Disp: 9 each, Rfl: 5 .  temazepam (RESTORIL) 15 MG capsule, TAKE ONE CAPSULE BY MOUTH AT BEDTIME AS NEEDED FOR SLEEP (Patient taking differently: TAKE ONE CAPSULE BY MOUTH DAILY AT BEDTIME), Disp: 30 capsule, Rfl: 0 .  fluticasone (FLONASE) 50 MCG/ACT nasal spray, Place 2 sprays into both nostrils daily. (Patient not taking: Reported on 10/29/2017), Disp: 16 g, Rfl: 0 No current facility-administered medications for this visit.   Facility-Administered Medications Ordered in Other Visits:  .  sodium chloride flush (NS) 0.9 % injection 10 mL, 10 mL, Intravenous, PRN, Volanda Napoleon, MD, 10 mL at 11/20/16 1153  Allergies: No Known Allergies  Past Medical History, Surgical history, Social history, and Family History were reviewed and updated.  Review of Systems: Review of Systems  Constitutional: Positive for malaise/fatigue.  HENT: Negative.   Eyes: Negative.   Respiratory: Positive for shortness of breath and wheezing.   Cardiovascular: Positive for palpitations and leg swelling.  Gastrointestinal: Positive for nausea.  Genitourinary: Negative.   Musculoskeletal: Positive for joint pain.  Neurological: Positive for dizziness.  Endo/Heme/Allergies: Negative.   Psychiatric/Behavioral: Negative.      Physical Exam:  weight is 219 lb 11.2 oz (99.7 kg). His temperature is 97.6 F (36.4 C). His blood pressure is 112/65 and his pulse is 88. His respiration is 19 and oxygen saturation  is 92%.     Physical Exam  Constitutional: He is oriented to person, place, and time.  HENT:  Head: Normocephalic and atraumatic.  Mouth/Throat: Oropharynx is clear and moist.  Eyes: Pupils are equal, round, and reactive to light. EOM are normal.  Neck: Normal range of motion.  Cardiovascular: Normal rate, regular rhythm and normal heart sounds.  Pulmonary/Chest: Effort normal. He has wheezes.  Abdominal: Soft. Bowel sounds are normal.  Musculoskeletal: Normal range of motion. He exhibits edema. He exhibits no tenderness or deformity.  Lymphadenopathy:    He has no cervical adenopathy.  Neurological: He is alert and oriented to person, place, and time.  Skin: Skin is warm and dry. No rash noted. No erythema.  Psychiatric: He has a normal mood and affect. His behavior is normal. Judgment and thought content normal.  Vitals reviewed.  Lab Results  Component Value Date   WBC 9.8 10/29/2017   HGB 13.9 10/29/2017   HCT 41.7 10/29/2017   MCV 90.8 10/29/2017   PLT 300 10/29/2017     Chemistry      Component Value Date/Time   NA 135 10/08/2017 0404   NA 139 03/26/2017 1007   NA 138 03/27/2016 1025   K 4.1 10/08/2017 0404   K 4.2 03/26/2017 1007   K 4.2 03/27/2016 1025   CL 93 (L) 10/08/2017 0404   CL 104 03/26/2017 1007   CO2 31 10/08/2017 0404   CO2 30 03/26/2017 1007   CO2 24 03/27/2016 1025   BUN 31 (H) 10/08/2017 0404   BUN 21 03/26/2017 1007   BUN 19.1 03/27/2016 1025   CREATININE 1.50 (H) 10/08/2017 0404   CREATININE 1.1 03/26/2017 1007   CREATININE 1.3 03/27/2016 1025      Component Value Date/Time   CALCIUM 9.1 10/08/2017 0404   CALCIUM 9.4 03/26/2017 1007   CALCIUM 8.7 03/27/2016 1025   ALKPHOS 83 10/07/2017 1353   ALKPHOS 99 (H) 03/26/2017 1007   ALKPHOS 85 03/27/2016 1025   AST 30 10/07/2017 1353   AST 26 03/26/2017 1007   AST 28 03/27/2016 1025   ALT 20 10/07/2017 1353   ALT 18 03/26/2017 1007   ALT 17 03/27/2016 1025   BILITOT 0.6 10/07/2017  1353   BILITOT 0.70 03/26/2017 1007   BILITOT 0.42 03/27/2016 1025         Impression and Plan: Mr. Olveda is 84 year old gentleman with a history of locally advanced-stage IIIB-adenocarcinoma of the right lung. He had contralateral mediastinal node involvement. He was treated with radiation and chemotherapy. He had a very nice response. He completed  treatment in December of 2012. Thankfully, His disease has not come back. This I am surprised by because of the extensive nature of his locally advanced disease.  I feel bad that his breathing still is not doing well.  I will try him on a nebulizer in the office.  Thankfully, he sees his pulmonologist next week.  We will see what the echocardiogram has to show.  I will see him back in a couple months.  I think we have to see him back sooner is because of all the issues that he is having.  I spent about 35 minutes with he and his daughter.  Over half time was spent face-to-face with him.   Volanda Napoleon, MD 5/22/201911:00 AM

## 2017-10-30 ENCOUNTER — Telehealth: Payer: Self-pay | Admitting: Emergency Medicine

## 2017-10-30 DIAGNOSIS — R0602 Shortness of breath: Secondary | ICD-10-CM

## 2017-10-30 NOTE — Telephone Encounter (Signed)
Called Patient's daughter back.  Sh said that her Father was released from the hospital around May 1.  She says that since he has come home he has gradually gotten worse.  He is [facility] with any ambulation.  He is currently on 4L [place].  His next appt is with NP Wyn Quaker on 11/06/17 at 1100.    RB please advise

## 2017-10-30 NOTE — Telephone Encounter (Signed)
CXR ordered and there are no available spots that are open next week. Advise what we need to do do after CXR comes back tomorrow.

## 2017-10-30 NOTE — Telephone Encounter (Signed)
I spoke with [name]. He is more SOB, difficult to determine etiology given his COPD and also heart disease.   Please set up CXR for SOB, they will come to the office tomorrow to get this.  See if he can be seen sooner than next week, if so switch his appt.  I'll review the CXR tomorrow, see if there are any recs based on it.

## 2017-10-31 ENCOUNTER — Ambulatory Visit (INDEPENDENT_AMBULATORY_CARE_PROVIDER_SITE_OTHER)
Admission: RE | Admit: 2017-10-31 | Discharge: 2017-10-31 | Disposition: A | Discharge status: Home / Self Care | Payer: Medicare Other | Source: Ambulatory Visit | Attending: Emergency Medicine | Admitting: Emergency Medicine

## 2017-10-31 DIAGNOSIS — R0602 Shortness of breath: Secondary | ICD-10-CM

## 2017-10-31 MED ORDER — PREDNISONE 10 MG PO TABS: ORAL_TABLET | ORAL | 0 refills | Status: DC

## 2017-10-31 NOTE — Telephone Encounter (Signed)
Spoke with Butch Penny and advised we are awaiting RB's recs on cxr, it has not been reviewed  Please advise thanks

## 2017-10-31 NOTE — Telephone Encounter (Signed)
[name] called to see about patient coming in for x-ray per her conversation with Dr. Lamonte Sakai.  She was advised order is in and we are working on seeing about getting him an earlier appt scheduled. She states patient will go to radiology today for x-ray.  Donna's CB # is [phone]

## 2017-10-31 NOTE — Telephone Encounter (Signed)
Please let the patient and Butch Penny know that the chest x-ray is unchanged compared with his priors.  This is good news.  I think that since he is going to be seen next week it would be reasonable to do a trial of another prednisone taper if he is willing to do so.  He will then follow-up with Aaron Edelman and can let us know whether this medication has been beneficial.  Offer him pred 30mg  x 3 days, 20mg  x 3 days, 10mg  x 3 days.

## 2017-10-31 NOTE — Telephone Encounter (Signed)
Spoke with the Butch Penny and notified of recs per RB  She verbalized understanding  I have sent in rx for pred  Pt to keep appt with Aaron Edelman for 11/06/17

## 2017-10-31 NOTE — Telephone Encounter (Signed)
Dr. Lamonte Sakai please advise on patients xray report once it comes in.   Thank you.

## 2017-10-31 NOTE — Telephone Encounter (Signed)
Pt's daughter Butch Penny is calling back [phone]

## 2017-11-04 ENCOUNTER — Other Ambulatory Visit: Payer: Self-pay

## 2017-11-04 ENCOUNTER — Ambulatory Visit (HOSPITAL_COMMUNITY): Payer: Medicare Other | Attending: Cardiology

## 2017-11-04 DIAGNOSIS — I272 Pulmonary hypertension, unspecified: Secondary | ICD-10-CM | POA: Diagnosis not present

## 2017-11-04 DIAGNOSIS — G4733 Obstructive sleep apnea (adult) (pediatric): Secondary | ICD-10-CM | POA: Diagnosis not present

## 2017-11-04 DIAGNOSIS — E119 Type 2 diabetes mellitus without complications: Secondary | ICD-10-CM | POA: Diagnosis not present

## 2017-11-04 DIAGNOSIS — Z86718 Personal history of other venous thrombosis and embolism: Secondary | ICD-10-CM | POA: Insufficient documentation

## 2017-11-04 DIAGNOSIS — J449 Chronic obstructive pulmonary disease, unspecified: Secondary | ICD-10-CM | POA: Insufficient documentation

## 2017-11-04 DIAGNOSIS — Z9221 Personal history of antineoplastic chemotherapy: Secondary | ICD-10-CM | POA: Diagnosis not present

## 2017-11-04 DIAGNOSIS — C3490 Malignant neoplasm of unspecified part of unspecified bronchus or lung: Secondary | ICD-10-CM | POA: Diagnosis not present

## 2017-11-04 DIAGNOSIS — I480 Paroxysmal atrial fibrillation: Secondary | ICD-10-CM | POA: Insufficient documentation

## 2017-11-04 DIAGNOSIS — I509 Heart failure, unspecified: Secondary | ICD-10-CM | POA: Diagnosis not present

## 2017-11-04 DIAGNOSIS — Z923 Personal history of irradiation: Secondary | ICD-10-CM | POA: Insufficient documentation

## 2017-11-04 DIAGNOSIS — Z87891 Personal history of nicotine dependence: Secondary | ICD-10-CM | POA: Insufficient documentation

## 2017-11-04 DIAGNOSIS — Z8249 Family history of ischemic heart disease and other diseases of the circulatory system: Secondary | ICD-10-CM | POA: Diagnosis not present

## 2017-11-04 DIAGNOSIS — E785 Hyperlipidemia, unspecified: Secondary | ICD-10-CM | POA: Insufficient documentation

## 2017-11-04 DIAGNOSIS — I110 Hypertensive heart disease with heart failure: Secondary | ICD-10-CM | POA: Diagnosis not present

## 2017-11-05 ENCOUNTER — Telehealth: Payer: Self-pay | Admitting: Emergency Medicine

## 2017-11-05 ENCOUNTER — Other Ambulatory Visit: Payer: Self-pay | Admitting: Hematology & Oncology

## 2017-11-05 DIAGNOSIS — Z9989 Dependence on other enabling machines and devices: Principal | ICD-10-CM

## 2017-11-05 DIAGNOSIS — G4733 Obstructive sleep apnea (adult) (pediatric): Secondary | ICD-10-CM

## 2017-11-05 DIAGNOSIS — C3401 Malignant neoplasm of right main bronchus: Secondary | ICD-10-CM

## 2017-11-05 NOTE — Telephone Encounter (Signed)
OK to send order for CPAP, Auto-set mode 5-20 cm H2O  With regard to the prednisone, he may be a candidate for this. I would like to see him in office, review how he has responded to recent pred, before starting an everyday dose.

## 2017-11-05 NOTE — Progress Notes (Deleted)
@Patient  ID: [name], male    DOB: Aug 17, 1924, 84 y.o.   MRN: [id number]  No chief complaint on file.   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 Was admitted 5/31 with acute dyspnea, found to be in A fib + RVR. Started on amiodarone. Converted to NSR 11/11/12. Scheduled for repeat Ct scan chest (Dr Katheran Awe) for surveillance on 11/25/12. During his hospitalization, we started the eval for his presumed COPD.  He has been on Spiriva before, SABA before, didn't notice any benefit.    Recent [facility] Encounters:   ROV 12/30/16 -- patient has a history of COPD, obstructive sleep apnea on CPAP, non-small cell lung cancer of the right middle lobe (chemotherapy, radiation). He also has a remote history of DVT, hypertension with diastolic CHF. He has been managed with Anoro and we have worked on getting patient assistance for him for the cost. He reports that he stopped Anoro for a brief period - missed it so he restarted it. He has some difficulty w secretions, thick. May be a bit better since last time. He uses albuterol about 0-1x a day. Remains on zyrtec, O2 at 2-3L/min. Dr Katheran Awe following with serial CXR's, next in October.   ROV 05/06/17 --this is a follow-up visit for 84 year old gentleman with COPD, obstructive sleep apnea on CPAP and right middle lobe non-small cell lung cancer status post chemotherapy and radiation therapy.  Also with a history of hypertension, diastolic CHF, remote DVT.  He was due to have a repeat CT scan of his chest in October although I do not see that this has been done. He had a CXR in April. He had been doing well until about 1 week ago he developed dyspnea, chest tightness after he did yard work. He had to start pred and abx > feeling a lot better. He has been on Anoro, was just changed to Trelegy and is tolerating.  He tells me that he averages about 3-4 AE's a year.   ROV 08/26/17 --84 year old gentleman with a history of COPD, obstructive sleep apnea on CPAP, right middle lobe non-small cell lung cancer that was treated with chemotherapy and radiation therapy.  He has hypertension with diastolic dysfunction, history of remote DVT.  He was seen in our office by T Parrett twice in February for progressive dyspnea, cough, congestion.  A chest x-ray showed bilateral effusions greater on the right than on the left.  He received Augmentin and extra diuretics.   Pleural effusions on repeat chest x-ray 07/29/17 were for the most part unchanged.  Thoracentesis was deferred. He tells me that he feels better - was experiencing flu-like sx that have resolved. His breathing really didn't change much, has had some slow progression of dyspnea. He is on Trelegy. Uses albuterol with some relief of wheeze but doesn;t really change his breathing.   10/31/2017-telephone encounter-chest x-ray due to shortness of breath worsening symptoms status post discharge, no changes on x-ray, prednisone taper offered to patient, patient's family wondering if patient would qualify for daily prednisone use  Tests:  08/25/2012-split-night study- AHI of 10.6   Imaging:  10/31/2017-chest x-ray- similar findings of marked lung hyperexpansion and chronic small right-sided effusion without definitive acute cardiopulmonary disease 10/07/2017- chest x-ray-underlying hyperexpansion small right pleural effusion  06/12/2016-CT chest without contrast- small to moderate right pleural effusion stable to slightly  increased, moderate emphysema  Cardiac:  11/04/2017-echocardiogram-LV ejection fraction 55 to 58%, grade 1 diastolic dysfunction   Labs:  4/29 respiratory panel-parainfluenza virus   Micro:  10/06/2017-blood cultures show no growth   Chart Review:  10/06/2017-hospitalization for acute on chronic respiratory failure with hypoxia and COPD  exacerbation >>>Follow-up with PCP in 1 to 2 weeks >>>Obtain basic metabolic panel and CBC in 1 week >>>8-day prednisone taper >>>DuoNeb prescription given   11/05/17 HFU         No Known Allergies  Immunization History  Administered Date(s) Administered  . Influenza Split 04/25/2011, 03/10/2012, 03/10/2013  . Influenza, High Dose Seasonal PF 03/28/2016  . Influenza,inj,Quad PF,6+ Mos 03/16/2014  . Influenza-Unspecified 05/09/2017  . Pneumococcal Conjugate-13 12/28/2015    Past Medical History:  Diagnosis Date  . Acute diastolic CHF (congestive heart failure) ([place])   . Allergic conjunctivitis   . Allergic rhinitis   . Atrial fibrillation ([place])   . BPH (benign prostatic hyperplasia)   . Cataract   . Cellulitis    hx  . COPD (chronic obstructive pulmonary disease) ([place])       . Diabetic peripheral neuropathy ([place])   . Diverticulosis    hx  . DJD (degenerative joint disease)    Left Knee  . DVT (deep venous thrombosis) ([place])    IN RIGHT ARM 11/2010   . Hearing loss   . Hyperlipidemia   . Hypertension   . Iron deficiency anemia 03/27/2016  . Iron deficiency anemia due to chronic blood loss 03/27/2016  . Lung cancer ([place])     history of stage IIIA non-small cell lung cancer who is currently being treated with both  Radiation and Chemotherapy  . Malabsorption of iron 03/27/2016  . Obstructive sleep apnea (adult)  10/29/2012   This patient has mild obstructive sleep apnea, tested in March 2014 at [facility] sleep. He had been a CPAP user for 14 years. AHi 10.7 He was titrated to a pressure of 9 cm water( after auto - titration). CPAP at night  --- 8-10 YR AGO....,OSA-diagnosed 2000  . Pneumonia    history  . Secondary diabetes with peripheral neuropathy ([place]) 11/07/2012    Tobacco History: Social History   Tobacco Use  Smoking Status Former Smoker  . Packs/day: 1.00  . Years: 50.00  . Pack years: 50.00  . Types: Cigarettes  . Start date: 05/18/1943  . Last  attempt to quit: 06/10/1992  . Years since quitting: 25.4  Smokeless Tobacco Never Used  Tobacco Comment   quit tobacco 3 years ago   Counseling given: Not Answered Comment: quit tobacco 3 years ago   Outpatient Encounter Medications as of 11/06/2017  Medication Sig  . acetaminophen (TYLENOL) 325 MG tablet Take 650 mg by mouth every 4 (four) hours as needed for headache (chills/low grade fever).  Marland Kitchen albuterol (PROVENTIL) (2.5 MG/3ML) 0.083% nebulizer solution Take 3 mLs (2.5 mg total) by nebulization 3 (three) times daily. 3 times daily times 5 days then every 6 hours as needed. (Patient taking differently: Take 2.5 mg by nebulization 3 (three) times daily. )  . amiodarone (PACERONE) 200 MG tablet Take 200 mg by mouth See admin instructions. Take one tablet (200 mg) by mouth on Monday, Wednesday, Friday mornings, may also take one tablet (200 mg) as needed for palpitations  . apixaban (ELIQUIS) 5 MG TABS tablet Take 1 tablet (5 mg total) by mouth 2 (two) times daily.  Marland Kitchen atorvastatin (LIPITOR) 20 MG tablet Take 20 mg by mouth at  bedtime.   . cetirizine (ZYRTEC) 10 MG tablet Take 10 mg by mouth daily.   Marland Kitchen diltiazem (CARDIZEM CD) 120 MG 24 hr capsule Take 120 mg by mouth daily.  . fluticasone (FLONASE) 50 MCG/ACT nasal spray Place 2 sprays into both nostrils daily. (Patient not taking: Reported on 10/29/2017)  . Fluticasone-Umeclidin-Vilant (TRELEGY ELLIPTA) 100-62.5-25 MCG/INH AEPB Inhale 1 puff into the lungs daily.  . furosemide (LASIX) 40 MG tablet Take 1 tablet (40 mg total) by mouth 2 (two) times daily. (Patient taking differently: Take 40 mg by mouth 2 (two) times daily with breakfast and lunch. )  . glucosamine-chondroitin 500-400 MG tablet Take 1 tablet by mouth 2 (two) times daily with a meal.   . glucose blood (ONETOUCH VERIO) test strip   . Insulin Detemir (LEVEMIR FLEXTOUCH) 100 UNIT/ML Pen Inject 22-26 Units into the skin See admin instructions. Inject 20 units subcutaneously before  breakfast, inject 16 units at bedtime, reported 10/29/17.  Marland Kitchen insulin lispro (HUMALOG) 100 UNIT/ML injection Inject 6-7 Units into the skin 3 (three) times daily as needed for high blood sugar (CBG >150 (150-250 6 units, >250 7 units)).   Marland Kitchen ipratropium-albuterol (DUONEB) 0.5-2.5 (3) MG/3ML SOLN Take 3 mLs by nebulization every 6 (six) hours as needed.  Marland Kitchen levothyroxine (SYNTHROID, LEVOTHROID) 25 MCG tablet Take 25 mcg by mouth daily before breakfast.  . metFORMIN (GLUCOPHAGE-XR) 500 MG 24 hr tablet Take 500 mg by mouth 2 (two) times daily after a meal.   . oxybutynin (DITROPAN) 5 MG tablet Take 5 mg by mouth at bedtime.   . pantoprazole (PROTONIX) 40 MG tablet Take 1 tablet (40 mg total) by mouth daily at 6 (six) AM. (Patient taking differently: Take 40 mg by mouth See admin instructions. Take one tablet (40 mg) by mouth before breakfast - 30 minutes after levothyroxine)  . Polyvinyl Alcohol-Povidone (REFRESH OP) Place 1 drop into both eyes daily as needed (dry eyes).  . predniSONE (DELTASONE) 10 MG tablet 3 x 3 days, 2 x 3 days, 1 x 3 days then stop  . PROAIR HFA 108 (90 Base) MCG/ACT inhaler INHALE TWO PUFFS BY MOUTH EVERY 6 HOURS AS NEEDED FOR WHEEZING AND FOR SHORTNESS OF BREATH  . temazepam (RESTORIL) 15 MG capsule TAKE ONE CAPSULE BY MOUTH AT BEDTIME AS NEEDED FOR SLEEP (Patient taking differently: TAKE ONE CAPSULE BY MOUTH DAILY AT BEDTIME)   Facility-Administered Encounter Medications as of 11/06/2017  Medication  . sodium chloride flush (NS) 0.9 % injection 10 mL     Review of Systems  Constitutional:   No  weight loss, night sweats,  fevers, chills, fatigue, or  lassitude HEENT:   No headaches,  Difficulty swallowing,  Tooth/dental problems, or  Sore throat, No sneezing, itching, ear ache, nasal congestion, post nasal drip  CV: No chest pain,  orthopnea, PND, swelling in lower extremities, anasarca, dizziness, palpitations, syncope  GI: No heartburn, indigestion, abdominal pain, nausea,  vomiting, diarrhea, change in bowel habits, loss of appetite, bloody stools Resp: No shortness of breath with exertion or at rest.  No excess mucus, no productive cough,  No non-productive cough,  No coughing up of blood.  No change in color of mucus.  No wheezing.  No chest wall deformity Skin: no rash, lesions, no skin changes. GU: no dysuria, change in color of urine, no urgency or frequency.  No flank pain, no hematuria  MS:  No joint pain or swelling.  No decreased range of motion.  No back pain. Psych:  No change in mood or affect. No depression or anxiety.  No memory loss.   Physical Exam  There were no vitals taken for this visit.  GEN: A/Ox3; pleasant , NAD, well nourished    HEENT:  [place]/AT,  EACs-clear, TMs-wnl, NOSE-clear, THROAT-clear, no lesions, no postnasal drip or exudate noted.   NECK:  Supple w/ fair ROM; no JVD; normal carotid impulses w/o bruits; no thyromegaly or nodules palpated; no lymphadenopathy.    RESP:  Clear  P & A; w/o, wheezes/ rales/ or rhonchi. no accessory muscle use, no dullness to percussion  CARD:  RRR, no m/r/g, no peripheral edema, pulses intact, no cyanosis or clubbing.  GI:   Soft & nt; nml bowel sounds; no organomegaly or masses detected.   Musco: Warm bil, no deformities or joint swelling noted.   Neuro: alert, no focal deficits noted.    Skin: Warm, no lesions or rashes    Lab Results:  CBC    Component Value Date/Time   WBC 9.8 10/29/2017 0956   WBC 17.5 (H) 10/08/2017 0404   RBC 4.59 10/29/2017 0956   HGB 13.9 10/29/2017 0956   HGB 15.1 03/26/2017 1007   HCT 41.7 10/29/2017 0956   HCT 45.1 03/26/2017 1007   PLT 300 10/29/2017 0956   PLT 182 03/26/2017 1007   MCV 90.8 10/29/2017 0956   MCV 90 03/26/2017 1007   MCH 30.3 10/29/2017 0956   MCHC 33.3 10/29/2017 0956   RDW 15.3 10/29/2017 0956   RDW 14.9 03/26/2017 1007   LYMPHSABS 1.2 10/29/2017 0956   LYMPHSABS 1.8 03/26/2017 1007   MONOABS 0.8 10/29/2017 0956   EOSABS  0.1 10/29/2017 0956   EOSABS 0.1 03/26/2017 1007   BASOSABS 0.0 10/29/2017 0956   BASOSABS 0.0 03/26/2017 1007    BMET    Component Value Date/Time   NA 135 (L) 10/29/2017 0956   NA 139 03/26/2017 1007   NA 138 03/27/2016 1025   K 3.8 10/29/2017 0956   K 4.2 03/26/2017 1007   K 4.2 03/27/2016 1025   CL 96 (L) 10/29/2017 0956   CL 104 03/26/2017 1007   CO2 29 10/29/2017 0956   CO2 30 03/26/2017 1007   CO2 24 03/27/2016 1025   GLUCOSE 313 (H) 10/29/2017 0956   GLUCOSE 188 (H) 03/26/2017 1007   BUN 17 10/29/2017 0956   BUN 21 03/26/2017 1007   BUN 19.1 03/27/2016 1025   CREATININE 1.26 10/29/2017 0956   CREATININE 1.1 03/26/2017 1007   CREATININE 1.3 03/27/2016 1025   CALCIUM 8.7 10/29/2017 0956   CALCIUM 9.4 03/26/2017 1007   CALCIUM 8.7 03/27/2016 1025   GFRNONAA 48 (L) 10/29/2017 0956   GFRAA 56 (L) 10/29/2017 0956    BNP    Component Value Date/Time   BNP 77.5 10/07/2017 0320    ProBNP    Component Value Date/Time   PROBNP 122.0 (H) 07/22/2017 1627    Imaging: Dg Chest 2 View  Result Date: 10/31/2017 CLINICAL DATA:  Increased shortness of breath and cough during the past week. History of CHF, COPD and atrial fibrillation. EXAM: CHEST - 2 VIEW COMPARISON:  10/07/2017; 07/22/2017; 07/04/2016; chest CT-07/10/2016 FINDINGS: Grossly unchanged cardiac silhouette and mediastinal contours with atherosclerotic plaque within the thoracic aorta. Stable positioning of support apparatus. The lungs remain hyperexpanded with flattening of the diaphragms and thinning of the biapical pulmonary parenchyma. Unchanged chronic small right-sided effusion associated right basilar opacities. No new focal airspace opacities. No evidence of edema. No acute osseus abnormalities. IMPRESSION:  1. Similar findings of marked lung hyperexpansion and chronic small right-sided effusion without definitive superimposed acute cardiopulmonary disease. 2.  Aortic Atherosclerosis (ICD10-I70.0).  Electronically Signed   By: Sandi Mariscal M.D.   On: 10/31/2017 10:54   Dg Chest 2 View  Result Date: 10/07/2017 CLINICAL DATA:  Shortness of breath EXAM: CHEST - 2 VIEW COMPARISON:  October 06, 2017 and September 13, 2017 FINDINGS: Lungs are somewhat hyperexpanded. There is a minimal right pleural effusion. There is no edema or consolidation. There is atelectatic change in the posterior base regions. Heart is upper normal in size with pulmonary vascularity within normal limits. There is aortic atherosclerosis. No evident adenopathy. Port-A-Cath tip is in the superior vena cava. No pneumothorax. There is degenerative change in the thoracic spine. IMPRESSION: Underlying hyperexpansion. Small right pleural effusion. Bibasilar atelectasis posteriorly. No frank edema or consolidation. Stable cardiac silhouette. There is aortic atherosclerosis. Aortic Atherosclerosis (ICD10-I70.0). Electronically Signed   By: Lowella Grip III M.D.   On: 10/07/2017 09:37     Assessment & Plan:   No problem-specific Assessment & Plan notes found for this encounter.     Lauraine Rinne, NP 11/05/2017

## 2017-11-05 NOTE — Telephone Encounter (Signed)
Spoke with pt's daughter wants to know if they give prednisone as a maintenance dose to prevent exacerbations? She may be asking if he is a candidate for this and if you would recommend it. She also is requesting an oder for a CPAP machine ([place]) sent to [facility]. RB please advise.   Patient Instructions by Collene Gobble, MD at 08/26/2017 2:15 PM  Author: Collene Gobble, MD Author Type: Physician Filed: 08/26/2017 2:35 PM  Note Status: Addendum Cosign: Cosign Not Required Encounter Date: 08/26/2017  Editor: Collene Gobble, MD (Physician)  Prior Versions: 1. Collene Gobble, MD (Physician) at 08/26/2017 2:29 PM - Signed    Please continue your CPAP every night When you are using your continuous oxygen please use 3 L/min at rest and increase to 4 L/min with exertion. When you are using your pulsed oxygen system please use 3 L/min at rest and increased to 5 L/min pulsed with exertion. Please continue Trelegy once a day.  Remember to rinse and gargle after using this medication. Please use albuterol if needed for wheezing, shortness of breath Speak with Dr. Wynonia Lawman about whether there is any benefit to adjusting your Lasix dosing. Follow with Dr Lamonte Sakai in 3 months or sooner if you have any problems.

## 2017-11-06 ENCOUNTER — Telehealth: Payer: Self-pay | Admitting: *Deleted

## 2017-11-06 ENCOUNTER — Inpatient Hospital Stay: Payer: Medicare Other | Admitting: Pulmonary Disease

## 2017-11-06 ENCOUNTER — Encounter: Payer: Self-pay | Admitting: Emergency Medicine

## 2017-11-06 ENCOUNTER — Ambulatory Visit: Payer: Medicare Other | Admitting: Emergency Medicine

## 2017-11-06 DIAGNOSIS — G4733 Obstructive sleep apnea (adult) (pediatric): Secondary | ICD-10-CM | POA: Diagnosis not present

## 2017-11-06 DIAGNOSIS — Z9989 Dependence on other enabling machines and devices: Secondary | ICD-10-CM | POA: Diagnosis not present

## 2017-11-06 DIAGNOSIS — J42 Unspecified chronic bronchitis: Secondary | ICD-10-CM | POA: Diagnosis not present

## 2017-11-06 MED ORDER — PREDNISONE 10 MG PO TABS: 10.0000 mg | ORAL_TABLET | Freq: Every day | ORAL | 2 refills | Status: DC

## 2017-11-06 NOTE — Telephone Encounter (Addendum)
Patient's daughter is aware of results. Echo routed to Cardiologist.   ----- Message from Volanda Napoleon, MD sent at 11/05/2017  4:58 PM EDT ----- Call - his LDH is very high.  I need to do a sonogram of his liver to make sure that there are NO liver mets from his lung cancer!!! Lattie Haw, Rudell Cobb, MD  P Onc Nurse Hp        Call his daughter -- the heart itself is pumping ok!! The aortic valve is getting tight. This could potentially make you get tired more quickly. Please send this to his cardiologist ( I am not sure who this is).   Laurey Arrow

## 2017-11-06 NOTE — Patient Instructions (Signed)
Please continue your Trelegy once daily.  Rinse and gargle after using. Use DuoNeb as needed for shortness of breath, cough, chest tightness. Continue your oxygen at 4 to 5 L/min continuous when you are at home.  When you are out of the home you need to increase her portable oxygen to 5 L/min pulsed.  This will likely mean that your tank will run out more quickly but it will protect you more effectively from low oxygen levels. Taper your prednisone to 10 mg daily as planned.  Stay on this dose once you get to 10 mg.  Call our office if your breathing declines on this dose.  We may need to increase slightly. We will work on getting you a new CPAP machine through [facility]. Follow with Dr Lamonte Sakai in 1 month

## 2017-11-06 NOTE — Telephone Encounter (Signed)
Called and spoke to pt's daughter, Butch Penny. Informed her of the recs per RB. Order placed for CPAP. Butch Penny states the pt has an appt today with Aaron Edelman, NP, but she would prefer pt to see RB. Reece Packer that pt should keep HFU appt today with Aaron Edelman and we can schedule a 4 week f/u with RB (first available). Butch Penny refused and requested appt with Aaron Edelman to be cancelled and to see where pt can be worked in with  [facility]. Pt scheduled with RB today for HFU (in a 30 min slot). Butch Penny verbalized understanding and denied any further questions or concerns at this time.

## 2017-11-06 NOTE — Assessment & Plan Note (Signed)
His CPAP machine is not functioning properly, he is worried about inadequate oxygen being bled in. It is over 82 years old and he needs a new machine.  We will order this through [facility].

## 2017-11-06 NOTE — Assessment & Plan Note (Signed)
He has never really fully recovered to his previous functional capacity since a recent hospitalization for exacerbation with parainfluenza pneumonia.  He did improve on his most recent prednisone taper.  He is on maximal bronchodilator therapy, oxygen therapy.  I think will be reasonable to try continuing him on low-dose prednisone to see if he gets clinical benefit.  We will go to 10 mg and assess his symptoms.  If this is an adequate then he may need a slightly higher dose.  Please continue your Trelegy once daily.  Rinse and gargle after using. Use DuoNeb as needed for shortness of breath, cough, chest tightness. Continue your oxygen at 4 to 5 L/min continuous when you are at home.  When you are out of the home you need to increase her portable oxygen to 5 L/min pulsed.  This will likely mean that your tank will run out more quickly but it will protect you more effectively from low oxygen levels. Taper your prednisone to 10 mg daily as planned.  Stay on this dose once you get to 10 mg.  Call our office if your breathing declines on this dose.  We may need to increase slightly. Follow with Dr Lamonte Sakai in 1 month

## 2017-11-06 NOTE — Progress Notes (Signed)
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 Was admitted 5/31 with acute dyspnea, found to be in A fib + RVR. Started on amiodarone. Converted to NSR 11/11/12. Scheduled for repeat Ct scan chest (Dr Katheran Awe) for surveillance on 11/25/12. During his hospitalization, we started the eval for his presumed COPD.  He has been on Spiriva before, SABA before, didn't notice any benefit.   ROV 08/14/15 -- follow-up visit for history of COPD, OSA on CPAP. He also has a history of adenocarcinoma of the lung followed by Dr Katheran Awe, treated with chemoradiation. He has not any evidence clinically of recurrence.  He was admitted end of 11/16 with CAP and Ae-COPD. No CT chest in several years, CXR without evidence recurrence in December. He is not on scheduled BD, didn't respond to Spiriva.   ROV 03/07/16 -- patient has a history of COPD, obstructive sleep apnea and non-small cell lung cancer (adenocarcinoma) that was treated with chemoradiation. Most recent CT scan of the chest was reassuring for no recurrence of disease in July 2017. He was seen here earlier this month with significant wheezing, progressive dyspnea. He had not been on scheduled bronchodilators and these were retried as he started Anoro. He continues to have severe exertional SOB. He is not clear that the Anoro has helped him any. He continues to have wheeze. He has had some chills over the last 2 days.   ROV 04/09/16 -- this is a follow-up visit for history of COPD, obstructive sleep apnea on CPAP, non-small cell lung cancer treated with chemoradiation. I saw him about a month ago with an acute exacerbation in the setting of community-acquired versus aspiration pneumonia. He was treated with antibiotics, steroids. He is improved, probably back to baseline. O2 at 2-3L/min. He is on Anoro, is unclear whether it has helped him much. He uses albuterol about 2-3x a  day. He is on zyrtec. Flu shot up to date. He has very good compliance with his CPAP.   ROV 10/07/16 -- patient follows up today for history of COPD, objective sleep apnea on CPAP, non-small cell lung cancer of the right middle lobe treated with chemotherapy and radiation. Also with a remote history of DVT, hypertension with diastolic CHF. He has been managed on Anoro - he is very reliable w his CPAP, compliance report shows 100% usage > 4h. Good clinical benefit. He is concerned that the Anoro is not helping him. He states that his exercise tolerance has decreased compared with last visit. Notes it w walking. No CP, tightness. No wt change. He uses lasix prn for edema, has been using a bit more lately. His iron level has been low - just received Fe infusion 1 weeks ago. He states tha this A fib has been very labile - tachy / brady/   ROV 12/30/16 -- patient has a history of COPD, obstructive sleep apnea on CPAP, non-small cell lung cancer of the right middle lobe (chemotherapy, radiation). He also has a remote history of DVT, hypertension with diastolic CHF. He has been managed with Anoro and we have worked on getting patient assistance for him for the cost. He reports that he stopped Anoro for a brief period - missed it so he restarted it. He has some difficulty w secretions, thick. May be a bit better since last time. He uses albuterol about 0-1x a day. Remains on zyrtec, O2 at 2-3L/min. Dr  Enniver following with serial CXR's, next in October.    ROV 05/06/17 --this is a follow-up visit for 84 year old gentleman with COPD, obstructive sleep apnea on CPAP and right middle lobe non-small cell lung cancer status post chemotherapy and radiation therapy.  Also with a history of hypertension, diastolic CHF, remote DVT.  He was due to have a repeat CT scan of his chest in October although I do not see that this has been done. He had a CXR in April. He had been doing well until about 1 week ago he developed dyspnea,  chest tightness after he did yard work. He had to start pred and abx > feeling a lot better. He has been on Anoro, was just changed to Trelegy and is tolerating. He tells me that he averages about 3-4 AE's a year.   ROV 08/26/17 --84 year old gentleman with a history of COPD, obstructive sleep apnea on CPAP, right middle lobe non-small cell lung cancer that was treated with chemotherapy and radiation therapy.  He has hypertension with diastolic dysfunction, history of remote DVT.  He was seen in our office by T Parrett twice in February for progressive dyspnea, cough, congestion.  A chest x-ray showed bilateral effusions greater on the right than on the left.  He received Augmentin and extra diuretics.   Pleural effusions on repeat chest x-ray 07/29/17 were for the most part unchanged.  Thoracentesis was deferred. He tells me that he feels better - was experiencing flu-like sx that have resolved. His breathing really didn't change much, has had some slow progression of dyspnea. He is on Trelegy. Uses albuterol with some relief of wheeze but doesn;t really change his breathing.   ROV 11/06/17 --Mr. Antillon is 50 with a history of COPD, right middle lobe non-small cell lung cancer that was treated with chemoradiation, hypertension with diastolic dysfunction, remote DVT, obstructive sleep apnea on CPAP.  He has chronic hypoxemic respiratory failure in the setting of all the above.  His course has been complicated by pleural effusions.  He was hospitalized for a recent acute exacerbation and has been out of the hospital for approximately 3 weeks. He declined after his pred was weaned to off - had to go back on pred taper 5/23 > improved again. He is on trelegy, is using duoNeb about 2x a day.  He is having troubble with desaturation on his CPAP machine. Needs a new machine.    Vitals:   11/06/17 1034  BP: 110/78  Pulse: 72  SpO2: 96%   Gen: Pleasant, elderly man, in no distress,  normal affect  ENT: No  lesions,  mouth clear,  oropharynx clear, no postnasal drip  Neck: No JVD, no stridor  Lungs: No use of accessory muscles, coarse B breath sounds  Cardiovascular: RRR, heart sounds normal, no murmur or gallops, trace lower extremity ankle edema  Musculoskeletal: No deformities, no cyanosis or clubbing  Neuro: alert, non focal  Skin: Warm, no lesions or rashes    COPD (chronic obstructive pulmonary disease) ([place]) He has never really fully recovered to his previous functional capacity since a recent hospitalization for exacerbation with parainfluenza pneumonia.  He did improve on his most recent prednisone taper.  He is on maximal bronchodilator therapy, oxygen therapy.  I think will be reasonable to try continuing him on low-dose prednisone to see if he gets clinical benefit.  We will go to 10 mg and assess his symptoms.  If this is an adequate then he may need a slightly higher  dose.  Please continue your Trelegy once daily.  Rinse and gargle after using. Use DuoNeb as needed for shortness of breath, cough, chest tightness. Continue your oxygen at 4 to 5 L/min continuous when you are at home.  When you are out of the home you need to increase her portable oxygen to 5 L/min pulsed.  This will likely mean that your tank will run out more quickly but it will protect you more effectively from low oxygen levels. Taper your prednisone to 10 mg daily as planned.  Stay on this dose once you get to 10 mg.  Call our office if your breathing declines on this dose.  We may need to increase slightly. Follow with Dr Lamonte Sakai in 1 month  OSA on CPAP His CPAP machine is not functioning properly, he is worried about inadequate oxygen being bled in. It is over 88 years old and he needs a new machine.  We will order this through [facility].   [name], MD, PhD 11/06/2017, 12:28 PM [facility] [phone] or if no answer [phone]

## 2017-11-07 ENCOUNTER — Other Ambulatory Visit (HOSPITAL_COMMUNITY): Payer: Medicare Other

## 2017-11-10 ENCOUNTER — Telehealth: Payer: Self-pay | Admitting: Hematology & Oncology

## 2017-11-10 NOTE — Telephone Encounter (Signed)
sw pt to confirm US abdomen 6/7 at 1115 am at [facility]

## 2017-11-14 ENCOUNTER — Ambulatory Visit (HOSPITAL_COMMUNITY)
Admission: RE | Admit: 2017-11-14 | Discharge: 2017-11-14 | Disposition: A | Discharge status: Home / Self Care | Payer: Medicare Other | Source: Ambulatory Visit | Attending: Hematology & Oncology | Admitting: Hematology & Oncology

## 2017-11-14 DIAGNOSIS — K8020 Calculus of gallbladder without cholecystitis without obstruction: Secondary | ICD-10-CM | POA: Insufficient documentation

## 2017-11-14 DIAGNOSIS — C3401 Malignant neoplasm of right main bronchus: Secondary | ICD-10-CM | POA: Diagnosis not present

## 2017-11-14 DIAGNOSIS — J90 Pleural effusion, not elsewhere classified: Secondary | ICD-10-CM | POA: Diagnosis not present

## 2017-11-17 ENCOUNTER — Telehealth: Payer: Self-pay | Admitting: *Deleted

## 2017-11-17 NOTE — Telephone Encounter (Addendum)
-----   Message from Volanda Napoleon, MD sent at 11/14/2017  5:20 PM EDT ----- Call - the liver looks ok.  Gallbladder has stones.  NO cancer.

## 2017-11-26 ENCOUNTER — Telehealth: Payer: Self-pay | Admitting: Emergency Medicine

## 2017-11-26 NOTE — Telephone Encounter (Signed)
Called and spoke with patients daughter, she wanted to give some information ahead of time to Aaron Edelman since she will be with her dad at the visit tomorrow 11/27/17   She states that she would like for Aaron Edelman to look into doing the 2000 non invasive ventilation for the patient as well as the steroid injections for his breathing.   I advised her that I will forward this message over to Aaron Edelman so that they can talk about this during the visit tomorrow.   Routing to [place] as an Pharmacist, hospital.

## 2017-11-26 NOTE — Progress Notes (Signed)
@Patient  ID: [name], male    DOB: [birth year], 84 y.o.   MRN: [id number]  Chief Complaint  Patient presents with  . Acute Visit    Increased SOB,decreased O2 60's. Chest pressure and tightness since last week. 4L pulsed 60%, 8L continous 90%, 4L to maintain 93%. Neb 4x/day, trelegy daily, pro-air rarely. Denies fever or chest pain. Daughter wants to know if vent would be appropriate at this time due to increased SOB and weakness.     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 Was admitted 5/31 with acute dyspnea, found to be in A fib + RVR. Started on amiodarone. Converted to NSR 11/11/12. Scheduled for repeat Ct scan chest (Dr Katheran Awe) for surveillance on 11/25/12. During his hospitalization, we started the eval for his presumed COPD.  He has been on Spiriva before, SABA before, didn't notice any benefit.    Recent [facility] Encounters:   ROV 12/30/16 -- patient has a history of COPD, obstructive sleep apnea on CPAP, non-small cell lung cancer of the right middle lobe (chemotherapy, radiation). He also has a remote history of DVT, hypertension with diastolic CHF. He has been managed with Anoro and we have worked on getting patient assistance for him for the cost. He reports that he stopped Anoro for a brief period - missed it so he restarted it. He has some difficulty w secretions, thick. May be a bit better since last time. He uses albuterol about 0-1x a day. Remains on zyrtec, O2 at 2-3L/min. Dr Katheran Awe following with serial CXR's, next in October.   ROV 05/06/17 --this is a follow-up visit for 84 year old gentleman with COPD, obstructive sleep apnea on CPAP and right middle lobe non-small cell lung cancer status post chemotherapy and radiation therapy.  Also with a history of hypertension, diastolic CHF, remote DVT.  He was due to have a  repeat CT scan of his chest in October although I do not see that this has been done. He had a CXR in April. He had been doing well until about 1 week ago he developed dyspnea, chest tightness after he did yard work. He had to start pred and abx > feeling a lot better. He has been on Anoro, was just changed to Trelegy and is tolerating. He tells me that he averages about 3-4 AE's a year.   ROV 08/26/17 --84 year old gentleman with a history of COPD, obstructive sleep apnea on CPAP, right middle lobe non-small cell lung cancer that was treated with chemotherapy and radiation therapy.  He has hypertension with diastolic dysfunction, history of remote DVT.  He was seen in our office by T Parrett twice in February for progressive dyspnea, cough, congestion.  A chest x-ray showed bilateral effusions greater on the right than on the left.  He received Augmentin and extra diuretics.   Pleural effusions on repeat chest x-ray 07/29/17 were for the most part unchanged.  Thoracentesis was deferred. He tells me that he feels better - was experiencing flu-like sx that have resolved. His breathing really didn't change much, has had some slow progression of dyspnea. He is on Trelegy. Uses albuterol with some relief of wheeze but doesn;t really change his breathing.   10/31/2017-telephone encounter-chest x-ray due to shortness of breath worsening symptoms status post discharge, no changes on x-ray, prednisone taper offered to patient, patient's family  wondering if patient would qualify for daily prednisone use   11/06/17 - OV - [place]  Seen after hospital discharge.  Patient started on low dose 10 mg daily prednisone.  Also new CPAP as ordered for patient.  Patient may require additional dosing on prednisone if clinical benefit is seen.  Patient to continue on trilogy as well as with his duo nebs.  11/26/2017-telephone encounter- increased work of breathing, family having questions about getting steroid injections to help with  breathing as well as noninvasive ventilation but  Tests:   08/25/2012-split-night study- AHI of 10.6    Imaging:  10/31/2017-chest x-ray- similar findings of marked lung hyperexpansion and chronic small right-sided effusion without definitive acute cardiopulmonary disease 10/07/2017- chest x-ray-underlying hyperexpansion small right pleural effusion  06/12/2016-CT chest without contrast- small to moderate right pleural effusion stable to slightly increased, moderate emphysema  Cardiac:  11/04/2017-echocardiogram-LV ejection fraction 55 to 60%, grade 1 diastolic dysfunction   Labs:  4/29 respiratory panel-parainfluenza virus   Micro:  10/06/2017-blood cultures show no growth   Chart Review:  10/06/2017-hospitalization for acute on chronic respiratory failure with hypoxia and COPD exacerbation >>>Follow-up with PCP in 1 to 2 weeks >>>Obtain basic metabolic panel and CBC in 1 week >>>8-day prednisone taper >>>DuoNeb prescription given       11/27/17 Acute  Pleasant 84 year old patient seen in office today.  Patient accompanied with daughter who is a retired Designer, jewellery.  Presenting today with concerns of worsening shortness of breath, as well as persistent thick productive mucus.  Patient also reporting that with exertion oxygen saturations drop as well as wheezing occurs.  Patient reporting they have been adherent to all their medications, including recent daily 10 mg of prednisone started after 11/06/2017 appointment with Dr. Lamonte Sakai.  Daughter also specifically interested in life 2000 ventilation system to help with oxygen needs during the day.  Of note, patient arrived on 4L pulsed via POC with oxygen saturation at 67%.  Patient was transitioned to 8 L continuous briefly and then transition down to 4 L continuous with oxygen saturations maintaining around 90 to 92%.  Patient shortness of breath and symptoms of heart racing resolved.  No Known Allergies  Immunization History    Administered Date(s) Administered  . Influenza Split 04/25/2011, 03/10/2012, 03/10/2013  . Influenza, High Dose Seasonal PF 03/28/2016  . Influenza,inj,Quad PF,6+ Mos 03/16/2014  . Influenza-Unspecified 05/09/2017  . Pneumococcal Conjugate-13 12/28/2015    Past Medical History:  Diagnosis Date  . Acute diastolic CHF (congestive heart failure) ([place])   . Allergic conjunctivitis   . Allergic rhinitis   . Atrial fibrillation ([place])   . BPH (benign prostatic hyperplasia)   . Cataract   . Cellulitis    hx  . COPD (chronic obstructive pulmonary disease) ([place])       . Diabetic peripheral neuropathy ([place])   . Diverticulosis    hx  . DJD (degenerative joint disease)    Left Knee  . DVT (deep venous thrombosis) ([place])    IN RIGHT ARM 11/2010   . Hearing loss   . Hyperlipidemia   . Hypertension   . Iron deficiency anemia 03/27/2016  . Iron deficiency anemia due to chronic blood loss 03/27/2016  . Lung cancer ([place])     history of stage IIIA non-small cell lung cancer who is currently being treated with both  Radiation and Chemotherapy  . Malabsorption of iron 03/27/2016  . Obstructive sleep apnea (adult)  10/29/2012   This patient has mild obstructive sleep apnea,  tested in March 2014 at [facility] sleep. He had been a CPAP user for 14 years. AHi 10.7 He was titrated to a pressure of 9 cm water( after auto - titration). CPAP at night  --- 8-10 YR AGO....,OSA-diagnosed 2000  . Pneumonia    history  . Secondary diabetes with peripheral neuropathy ([place]) 11/07/2012    Tobacco History: Social History   Tobacco Use  Smoking Status Former Smoker  . Packs/day: 1.00  . Years: 50.00  . Pack years: 50.00  . Types: Cigarettes  . Start date: 05/18/1943  . Last attempt to quit: 06/10/1992  . Years since quitting: 25.4  Smokeless Tobacco Never Used  Tobacco Comment   quit tobacco 3 years ago   Counseling given: Yes Comment: quit tobacco 3 years ago Continue not smoking.  Outpatient  Encounter Medications as of 11/27/2017  Medication Sig  . acetaminophen (TYLENOL) 325 MG tablet Take 650 mg by mouth every 4 (four) hours as needed for headache (chills/low grade fever).  Marland Kitchen albuterol (PROVENTIL) (2.5 MG/3ML) 0.083% nebulizer solution Take 3 mLs (2.5 mg total) by nebulization 3 (three) times daily. 3 times daily times 5 days then every 6 hours as needed. (Patient taking differently: Take 2.5 mg by nebulization 3 (three) times daily. )  . amiodarone (PACERONE) 200 MG tablet Take 200 mg by mouth See admin instructions. Take one tablet (200 mg) by mouth on Monday, Wednesday, Friday mornings, may also take one tablet (200 mg) as needed for palpitations  . apixaban (ELIQUIS) 5 MG TABS tablet Take 1 tablet (5 mg total) by mouth 2 (two) times daily.  Marland Kitchen atorvastatin (LIPITOR) 20 MG tablet Take 20 mg by mouth at bedtime.   . cetirizine (ZYRTEC) 10 MG tablet Take 10 mg by mouth daily.   Marland Kitchen diltiazem (CARDIZEM CD) 120 MG 24 hr capsule Take 120 mg by mouth daily.  . Fluticasone-Umeclidin-Vilant (TRELEGY ELLIPTA) 100-62.5-25 MCG/INH AEPB Inhale 1 puff into the lungs daily.  . furosemide (LASIX) 40 MG tablet Take 40 mg by mouth daily.  Marland Kitchen glucosamine-chondroitin 500-400 MG tablet Take 1 tablet by mouth 2 (two) times daily with a meal.   . glucose blood (ONETOUCH VERIO) test strip   . Insulin Detemir (LEVEMIR FLEXTOUCH) 100 UNIT/ML Pen Inject 22-26 Units into the skin See admin instructions. Inject 20 units subcutaneously before breakfast, inject 16 units at bedtime, reported 10/29/17.  Marland Kitchen insulin lispro (HUMALOG) 100 UNIT/ML injection Inject 6-7 Units into the skin 3 (three) times daily as needed for high blood sugar (CBG >150 (150-250 6 units, >250 7 units)).   Marland Kitchen ipratropium-albuterol (DUONEB) 0.5-2.5 (3) MG/3ML SOLN Take 3 mLs by nebulization every 6 (six) hours as needed.  Marland Kitchen levothyroxine (SYNTHROID, LEVOTHROID) 25 MCG tablet Take 25 mcg by mouth daily before breakfast.  . metFORMIN (GLUCOPHAGE-XR)  500 MG 24 hr tablet Take 500 mg by mouth 2 (two) times daily after a meal.   . oxybutynin (DITROPAN) 5 MG tablet Take 5 mg by mouth at bedtime.   . Polyvinyl Alcohol-Povidone (REFRESH OP) Place 1 drop into both eyes daily as needed (dry eyes).  . predniSONE (DELTASONE) 10 MG tablet 3 x 3 days, 2 x 3 days, 1 x 3 days then stop  . predniSONE (DELTASONE) 10 MG tablet Take 1 tablet (10 mg total) by mouth daily with breakfast.  . PROAIR HFA 108 (90 Base) MCG/ACT inhaler INHALE TWO PUFFS BY MOUTH EVERY 6 HOURS AS NEEDED FOR WHEEZING AND FOR SHORTNESS OF BREATH  . temazepam (RESTORIL) 15  MG capsule TAKE ONE CAPSULE BY MOUTH AT BEDTIME AS NEEDED FOR SLEEP (Patient taking differently: TAKE ONE CAPSULE BY MOUTH DAILY AT BEDTIME)  . [DISCONTINUED] predniSONE (DELTASONE) 10 MG tablet Take 1 tablet (10 mg total) by mouth daily with breakfast.  . doxycycline (VIBRA-TABS) 100 MG tablet Take 1 tablet (100 mg total) by mouth 2 (two) times daily.   Facility-Administered Encounter Medications as of 11/27/2017  Medication  . [COMPLETED] methylPREDNISolone acetate (DEPO-MEDROL) injection 80 mg  . sodium chloride flush (NS) 0.9 % injection 10 mL     Review of Systems  Constitutional:  +fatigue increased, fever - 99   No  weight loss, night sweats  HEENT: +nasal congestion, bloody/ yellow sinus drainage, runny nose, HA occasional    No Difficulty swallowing,  Tooth/dental problems, or  Sore throat, No sneezing, itching, ear ache CV: +heart rate increased with sob,  No chest pain,  orthopnea, PND, swelling in lower extremities, anasarca, dizziness, syncope  GI: No heartburn, indigestion, abdominal pain, nausea, vomiting, diarrhea, change in bowel habits, loss of appetite, bloody stools Resp: +sob with exertion/rest, cough with brown/yellow mucous, wheezing    No chest wall deformity Skin: no rash, lesions, no skin changes. GU: no dysuria, change in color of urine, no urgency or frequency.  No flank pain, no  hematuria  MS:  No joint pain or swelling.  No decreased range of motion.  No back pain. Psych:  No change in mood or affect. No depression or anxiety.  No memory loss.   Physical Exam  BP 110/60   Pulse 85   Ht 5\' 10"  (1.778 m)   Wt 216 lb 9.6 oz (98.2 kg)   SpO2 92%   BMI 31.08 kg/m   Wt Readings from Last 3 Encounters:  11/27/17 216 lb 9.6 oz (98.2 kg)  10/29/17 219 lb 11.2 oz (99.7 kg)  10/09/17 211 lb 14.4 oz (96.1 kg)     GEN: A/Ox3; pleasant , NAD, well nourished, on 4L continuous    HEENT:  [place]/AT,  EACs-clear, TMs-wnl, NOSE-clear, THROAT-clear, no lesions, no postnasal drip or exudate noted.   NECK:  Supple w/ fair ROM; no JVD; normal carotid impulses w/o bruits; no thyromegaly or nodules palpated; no lymphadenopathy.    RESP:  +coarse bilateral breath sounds, with expiratory wheeze, diminished slighing on right lower lobes   no accessory muscle use, no dullness to percussion  CARD:  RRR, no m/r/g, no peripheral edema, pulses intact, no cyanosis or clubbing.  GI:   Soft & nt; nml bowel sounds; no organomegaly or masses detected.   Musco: Warm bil, no deformities or joint swelling noted.   Neuro: alert, no focal deficits noted.    Skin: Warm, no lesions or rashes   Patient is a good candidate for AVAPs. The patient has COPD and chronic respiratory failure; severe and life-threatening disease state.  NIPPV is essential therapy for patient's chronic condition since modes on the machine can accommodate a dual prescription and it is able to measure the volume and pressures accurately.  Close monitoring with the NIV will be most efficient with the patient foal of maintaining adequate respiratory status and avoiding future hospital stays.  BIPAP and CPAP do not have these capabilities, so these modes of therapy have been ruled out.  NIPPV will provide mouthpiece ventilation with BPAP ST cannot.  Furthermore, a home BIPAP or CPAP is insufficient due to severity of patient's  conditions; absence of respiratory support via NIPPV for more than four hours  can increase PCO2 and as a result patient can go into respiratory failure and the resulting outcome can lead to death.  The patient can wear NIPPV greater than 4 hours during the day and nocturnal use.  The features that the NIPPV exhibits is the best modality for patient care of COPD secondary to respiratory failure and could aid with increasing the patient's exertional tolerance while reducing acute exacerbations/respiratory distress.  We recommend NIPPV to reduce risk of tracheostomy and or long-term ventilation.  NIPPV needs to be expedited for authorization through insurance to be safely discharged from the hospital with device.    Lab Results:  CBC    Component Value Date/Time   WBC 9.8 10/29/2017 0956   WBC 17.5 (H) 10/08/2017 0404   RBC 4.59 10/29/2017 0956   HGB 13.9 10/29/2017 0956   HGB 15.1 03/26/2017 1007   HCT 41.7 10/29/2017 0956   HCT 45.1 03/26/2017 1007   PLT 300 10/29/2017 0956   PLT 182 03/26/2017 1007   MCV 90.8 10/29/2017 0956   MCV 90 03/26/2017 1007   MCH 30.3 10/29/2017 0956   MCHC 33.3 10/29/2017 0956   RDW 15.3 10/29/2017 0956   RDW 14.9 03/26/2017 1007   LYMPHSABS 1.2 10/29/2017 0956   LYMPHSABS 1.8 03/26/2017 1007   MONOABS 0.8 10/29/2017 0956   EOSABS 0.1 10/29/2017 0956   EOSABS 0.1 03/26/2017 1007   BASOSABS 0.0 10/29/2017 0956   BASOSABS 0.0 03/26/2017 1007    BMET    Component Value Date/Time   NA 135 (L) 10/29/2017 0956   NA 139 03/26/2017 1007   NA 138 03/27/2016 1025   K 3.8 10/29/2017 0956   K 4.2 03/26/2017 1007   K 4.2 03/27/2016 1025   CL 96 (L) 10/29/2017 0956   CL 104 03/26/2017 1007   CO2 29 10/29/2017 0956   CO2 30 03/26/2017 1007   CO2 24 03/27/2016 1025   GLUCOSE 313 (H) 10/29/2017 0956   GLUCOSE 188 (H) 03/26/2017 1007   BUN 17 10/29/2017 0956   BUN 21 03/26/2017 1007   BUN 19.1 03/27/2016 1025   CREATININE 1.26 10/29/2017 0956   CREATININE  1.1 03/26/2017 1007   CREATININE 1.3 03/27/2016 1025   CALCIUM 8.7 10/29/2017 0956   CALCIUM 9.4 03/26/2017 1007   CALCIUM 8.7 03/27/2016 1025   GFRNONAA 48 (L) 10/29/2017 0956   GFRAA 56 (L) 10/29/2017 0956    BNP    Component Value Date/Time   BNP 77.5 10/07/2017 0320    ProBNP    Component Value Date/Time   PROBNP 122.0 (H) 07/22/2017 1627    Imaging: Dg Chest 2 View  Result Date: 11/27/2017 CLINICAL DATA:  Increasing shortness of breath and productive cough for several days EXAM: CHEST - 2 VIEW COMPARISON:  10/31/2017 FINDINGS: Cardiac shadow is stable. Aortic calcifications are again seen. Left chest wall port is again noted and stable. Chronic right-sided pleural effusion is seen. Diffuse increased interstitial changes are noted within the right lung when compared with the prior exam which may represent some mild unilateral edema. Underlying parenchymal infiltrate cannot be totally excluded. Left lung again demonstrates diffuse scarring. IMPRESSION: Diffuse increased interstitial changes on the right. This may be related to unilateral edema or underlying pulmonic infiltrate. Short-term follow-up is recommended. Electronically Signed   By: Inez Catalina M.D.   On: 11/27/2017 10:49   Dg Chest 2 View  Result Date: 10/31/2017 CLINICAL DATA:  Increased shortness of breath and cough during the past week. History of CHF,  COPD and atrial fibrillation. EXAM: CHEST - 2 VIEW COMPARISON:  10/07/2017; 07/22/2017; 07/04/2016; chest CT-07/10/2016 FINDINGS: Grossly unchanged cardiac silhouette and mediastinal contours with atherosclerotic plaque within the thoracic aorta. Stable positioning of support apparatus. The lungs remain hyperexpanded with flattening of the diaphragms and thinning of the biapical pulmonary parenchyma. Unchanged chronic small right-sided effusion associated right basilar opacities. No new focal airspace opacities. No evidence of edema. No acute osseus abnormalities.  IMPRESSION: 1. Similar findings of marked lung hyperexpansion and chronic small right-sided effusion without definitive superimposed acute cardiopulmonary disease. 2.  Aortic Atherosclerosis (ICD10-I70.0). Electronically Signed   By: Sandi Mariscal M.D.   On: 10/31/2017 10:54   US Abdomen Limited  Result Date: 11/14/2017 CLINICAL DATA:  Elevated liver function test. Elevated LDH. History of lung cancer. EXAM: ULTRASOUND ABDOMEN LIMITED RIGHT UPPER QUADRANT COMPARISON:  CT scan abdomen dated 03/14/2016 FINDINGS: Gallbladder: Chronic cholelithiasis.  No gallbladder wall thickening. Common bile duct: Diameter: 1.9 mm, normal. Liver: No focal lesion identified. Within normal limits in parenchymal echogenicity. Portal vein is patent on color Doppler imaging with normal direction of blood flow towards the liver. Incidental note is made of a small right pleural effusion. IMPRESSION: 1. No evidence of metastatic disease to the liver or other abnormality of the liver. 2. Chronic cholelithiasis. 3. Small right pleural effusion. Electronically Signed   By: Lorriane Shire M.D.   On: 11/14/2017 16:41     Assessment & Plan:   Pleasant 84 year old patient seen in office today.  Discussed symptoms as well as chronic issues with the patient as well as daughter.  I will treat the COPD exacerbation today with doxycycline, Depo-Medrol injection, as well as a prednisone taper.  I will slowly taper his prednisone and leave the patient had 20 mg daily until he is seen back in our office in 2 weeks.  We will also get a chest x-ray as slightly diminished breath sounds on right lobe exam to rule out any sort of infiltrates.  Discussed with patient and family that if pneumonia or infiltrates are seen then we will continue with the doxycycline and prednisone, will need another repeat chest x-ray at next appointment, if symptoms are not improving we may need to change antibiotic.  Will start the paperwork for life 2000 ventilation  system.  Once approved through insurance we will have patient as well as wrap meet for in-service to see if they would like to proceed forward with this device as you need to eat tanks in order to use this as well.  Will [facility] and change oxygen needs to 4 to 6 L with exertion at home.  Continue CPAP use.  We will have patient follow-up in office in 2 weeks.  After extensive discussions with patient as well as with daughter, offered palliative care referral as patient is has maximum treatment for COPD, daily prednisone, 84 years old, and having recurrent flares.  Patient and daughter are willing to proceed with this referral and welcome any additional support that they may provide.  COPD with acute exacerbation (HCC) Chest Xray today   Doxycycline >>> 1 100 mg tablet every 12 hours for 7 days >>>take with food  >>>wear sunscreen   Prednisone  >>> start this on 11/28/17 - 4 tabs (40mg ) for 7 days, then 3 tabs (30mg ) for 7 days, 2 (20mg )  tabs daily indefinitely until return to office >>>take with food  Depomedrol 80mg  IM  Today   Follow up in 2 weeks  Will refer to palliative care today for home visit / support     OSA on CPAP Continue CPAP use  Acute on chronic respiratory failure with hypoxia (HCC) Continue oxygen therapy Can increase oxygen use to 4 to 6 L with exertion at home, continue 4 L at rest Ensure that you are checking your oxygen levels at home to maintain oxygen saturations greater than 88% Follow-up with our office if you are unable to maintain oxygen levels greater than 88% with exertion as well as at rest Continue CPAP use  We will start paperwork for life 2000 portable ventilation system today.  Will have you come back to meet with the rep to actually see the system and see how it works before proceeding forward with ordering.     Lauraine Rinne, NP 11/27/2017

## 2017-11-27 ENCOUNTER — Telehealth: Payer: Self-pay | Admitting: Emergency Medicine

## 2017-11-27 ENCOUNTER — Telehealth: Payer: Self-pay | Admitting: Pulmonary Disease

## 2017-11-27 ENCOUNTER — Ambulatory Visit: Payer: Medicare Other | Admitting: Pulmonary Disease

## 2017-11-27 ENCOUNTER — Encounter: Payer: Self-pay | Admitting: Pulmonary Disease

## 2017-11-27 ENCOUNTER — Ambulatory Visit (INDEPENDENT_AMBULATORY_CARE_PROVIDER_SITE_OTHER)
Admission: RE | Admit: 2017-11-27 | Discharge: 2017-11-27 | Disposition: A | Discharge status: Home / Self Care | Payer: Medicare Other | Source: Ambulatory Visit | Attending: Pulmonary Disease | Admitting: Pulmonary Disease

## 2017-11-27 VITALS — BP 110/60 | HR 85 | Ht 70.0 in | Wt 216.6 lb

## 2017-11-27 DIAGNOSIS — J441 Chronic obstructive pulmonary disease with (acute) exacerbation: Secondary | ICD-10-CM

## 2017-11-27 DIAGNOSIS — G4733 Obstructive sleep apnea (adult) (pediatric): Secondary | ICD-10-CM | POA: Diagnosis not present

## 2017-11-27 DIAGNOSIS — J9621 Acute and chronic respiratory failure with hypoxia: Secondary | ICD-10-CM | POA: Diagnosis not present

## 2017-11-27 DIAGNOSIS — Z9989 Dependence on other enabling machines and devices: Secondary | ICD-10-CM | POA: Diagnosis not present

## 2017-11-27 MED ORDER — PREDNISONE 10 MG PO TABS: 10.0000 mg | ORAL_TABLET | Freq: Every day | ORAL | 2 refills | Status: DC

## 2017-11-27 MED ORDER — DOXYCYCLINE HYCLATE 100 MG PO TABS: 100.0000 mg | ORAL_TABLET | Freq: Two times a day (BID) | ORAL | 0 refills | Status: DC

## 2017-11-27 MED ORDER — METHYLPREDNISOLONE ACETATE 80 MG/ML IJ SUSP
80.0000 mg | Freq: Once | INTRAMUSCULAR | Status: AC
  Administered 2017-11-27: 80 mg via INTRAMUSCULAR

## 2017-11-27 MED ORDER — PREDNISONE 10 MG PO TABS: ORAL_TABLET | ORAL | 2 refills | Status: DC

## 2017-11-27 NOTE — Assessment & Plan Note (Signed)
Chest Xray today   Doxycycline >>> 1 100 mg tablet every 12 hours for 7 days >>>take with food  >>>wear sunscreen   Prednisone  >>> start this on 11/28/17 - 4 tabs (40mg ) for 7 days, then 3 tabs (30mg ) for 7 days, 2 (20mg )  tabs daily indefinitely until return to office >>>take with food  Depomedrol 80mg  IM  Today   Follow up in 2 weeks    Will refer to palliative care today for home visit / support

## 2017-11-27 NOTE — Telephone Encounter (Signed)
Called Caryl Pina back at the number provided, received no answer. Could not leave a VM. Will attempt to call back later.

## 2017-11-27 NOTE — Telephone Encounter (Signed)
Looked at pt's visit that he had today with Wyn Quaker, NP and saw where it stated for pt to take 40mg  x1 week, 30mg  x1 week and then to continue on 20mg  daily.  Sent a new script of prednisone to pt's pharmacy with the tapering instructions and also stated to continue on 20mg .  Called and spoke with pt's daughter Butch Penny letting her know this was sent.  Butch Penny expressed understanding. Nothing further needed.

## 2017-11-27 NOTE — Patient Instructions (Addendum)
Chest Xray today   Doxycycline >>> 1 100 mg tablet every 12 hours for 7 days >>>take with food  >>>wear sunscreen   Prednisone  >>> start this on 11/28/17 - 4 tabs (40mg ) for 7 days, then 3 tabs (30mg ) for 7 days, 2 (20mg )  tabs daily indefinitely until return to office >>>take with food  Depomedrol 80mg  IM  Today   Follow up in 2 weeks    Will refer to palliative care today for home visit / support      Please contact the office if your symptoms worsen or you have concerns that you are not improving.   Thank you for choosing  [facility] for your healthcare, and for allowing [place] to partner with you on your healthcare journey. I am thankful to be able to provide care to you today.   Wyn Quaker FNP-C

## 2017-11-27 NOTE — Progress Notes (Signed)
Your chest x-ray results of come back.  It does show a right infiltrate.  We will continue with the antibiotics that we discussed in office today.  We will also do a repeat chest x-ray when you come back in the office in 2 weeks.  Please contact our office if you are having any concerns about your breathing, fevers, or worsening symptoms.  It was a pleasure to take care of you,  Wyn Quaker FNP

## 2017-11-27 NOTE — Assessment & Plan Note (Signed)
Continue CPAP use

## 2017-11-27 NOTE — Assessment & Plan Note (Signed)
Continue oxygen therapy Can increase oxygen use to 4 to 6 L with exertion at home, continue 4 L at rest Ensure that you are checking your oxygen levels at home to maintain oxygen saturations greater than 88% Follow-up with our office if you are unable to maintain oxygen levels greater than 88% with exertion as well as at rest Continue CPAP use  We will start paperwork for life 2000 portable ventilation system today.  Will have you come back to meet with the rep to actually see the system and see how it works before proceeding forward with ordering.

## 2017-12-01 ENCOUNTER — Telehealth: Payer: Self-pay | Admitting: Pulmonary Disease

## 2017-12-01 NOTE — Telephone Encounter (Signed)
Spoke with Caryl Pina. He stated that [facility] will not cover the order for the patient's non-invasive vent. [employer] will cover up to 3 months of use of the Trilogy or Astral machines but then the patient would be responsible for the rest of the payment after then. He stated that is normally $1000-$2000 a month.   He wanted to let Aaron Edelman know. Will route to Aaron Edelman so he is aware.

## 2017-12-01 NOTE — Telephone Encounter (Signed)
Left  Message with Caryl Pina in regards to insurance covering non-invasive vent. Awaiting call back. Emailed Rebekah [place] rep for further assistance as well.

## 2017-12-01 NOTE — Telephone Encounter (Signed)
Ashley/[employer], returning call.  CB is [phone].

## 2017-12-01 NOTE — Telephone Encounter (Signed)
Spoke with Jeneen Rinks at [place], he informed me no follow up call was needed for [place] at [place]. Jeneen Rinks and his office would be spear heading the approval for Non-invasive vent. Nothing further needed at this time.   [name] [phone] [email or web address]

## 2017-12-01 NOTE — Telephone Encounter (Signed)
[name] with [facility] called regarding message from [place]. Advised [place] of this message, she did not have anything further for [name] to go over at time of call. [name] states that pt does not meet the insurance qualifications for vent to be covered.  Spoke with [place] and [employer]; they have spoke with Jeneen Rinks with [employer] representative for [id number]. They are working with Jeneen Rinks at this time to help qualify pt for vent. Nothing further needed at this time.

## 2017-12-01 NOTE — Telephone Encounter (Signed)
Will route to [place] who is working with Jeneen Rinks who is the rep for Life 2000.   Wyn Quaker FNP

## 2017-12-01 NOTE — Telephone Encounter (Signed)
Will route this note to [place].  Oris Drone been working with Jeneen Rinks life 2000 wrap.  We know that this can take up to 14 business days as life 2000 works with insurance to get documentation and paperwork completed.  I appreciate you updating [place].  Family is aware that this can take time with a new that we would have to process all this paperwork before we could even proceed forward from this.  Thank you for letting me know.  Wyn Quaker FNP

## 2017-12-02 NOTE — Telephone Encounter (Signed)
Nothing needed at this time.  

## 2017-12-03 ENCOUNTER — Telehealth: Payer: Self-pay | Admitting: Pulmonary Disease

## 2017-12-03 NOTE — Telephone Encounter (Signed)
Called and spoke to pt's daughter, Butch Penny. Butch Penny is requesting that our office contact [employer] and cancel apt for rep to come to 12/12/17 OV with portable vent. Butch Penny stated that pt does not wish to proceed with vent, as his breathing has greatly improved.  Will place order to d/c vent, after receiving [facility] response.  BM please advise. Thanks

## 2017-12-05 ENCOUNTER — Ambulatory Visit: Payer: Medicare Other | Admitting: Emergency Medicine

## 2017-12-05 NOTE — Telephone Encounter (Signed)
Called and spoke with patient regarding [name] below message. Pt states that the prednisone is helping him, pt reports feeling much better. Pt states he rescheduled ov with [name] for cpap f/u for another month due to [employer] guidelines.  Pt verbalized understanding and had no further questions at this time. Nothing further needed.

## 2017-12-05 NOTE — Telephone Encounter (Signed)
Wyn Quaker, NP has been out of the office-will return today and advise. Thanks.

## 2017-12-05 NOTE — Telephone Encounter (Signed)
I am okay with this.  We new we are starting this paperwork that it may not be some that we would proceed with completely.  I do want to check to see with the patient to see how they are doing on the prednisone 20 mg.  If there feeling that symptoms have resolved or improved significantly we may need to try to transition to 15 mg daily.  If patient and daughter feel comfortable making this transition on their own and fine with doing this is a telephone encounter.  If they would like to discuss this further they are more than welcome to come in for an office visit or keep the office visit they already have as initially planned.  If they are willing to proceed if you could please put a order in for prednisone to transition to 15 mg daily.  Wyn Quaker FNP

## 2017-12-12 ENCOUNTER — Ambulatory Visit: Payer: Medicare Other | Admitting: Pulmonary Disease

## 2017-12-12 ENCOUNTER — Telehealth: Payer: Self-pay | Admitting: Emergency Medicine

## 2017-12-12 MED ORDER — FLUTICASONE-UMECLIDIN-VILANT 100-62.5-25 MCG/INH IN AEPB: 1.0000 | INHALATION_SPRAY | Freq: Every day | RESPIRATORY_TRACT | 3 refills | Status: DC

## 2017-12-12 NOTE — Telephone Encounter (Signed)
Received forms from up front. They are patient assistance forms for Trelegy. Spoke with Butch Penny to advise that I have completed the forms and will fax them to [place]. She verbalized understanding. Will place the originals in RB's cubby for follow up if needed.   Nothing else needed at time of call.

## 2017-12-17 ENCOUNTER — Inpatient Hospital Stay: Payer: Medicare Other | Attending: Hematology & Oncology

## 2017-12-17 ENCOUNTER — Other Ambulatory Visit: Payer: Self-pay

## 2017-12-17 VITALS — BP 126/72 | HR 65 | Temp 97.7°F | Resp 20

## 2017-12-17 DIAGNOSIS — Z85118 Personal history of other malignant neoplasm of bronchus and lung: Secondary | ICD-10-CM | POA: Insufficient documentation

## 2017-12-17 DIAGNOSIS — Z95828 Presence of other vascular implants and grafts: Secondary | ICD-10-CM

## 2017-12-17 DIAGNOSIS — Z452 Encounter for adjustment and management of vascular access device: Secondary | ICD-10-CM | POA: Diagnosis present

## 2017-12-17 MED ORDER — HEPARIN SOD (PORK) LOCK FLUSH 100 UNIT/ML IV SOLN
500.0000 [IU] | Freq: Once | INTRAVENOUS | Status: AC
  Administered 2017-12-17: 500 [IU] via INTRAVENOUS
  Filled 2017-12-17: qty 5

## 2017-12-17 MED ORDER — SODIUM CHLORIDE 0.9% FLUSH
10.0000 mL | INTRAVENOUS | Status: DC | PRN
  Administered 2017-12-17: 10 mL via INTRAVENOUS
  Filled 2017-12-17: qty 10

## 2017-12-17 NOTE — Patient Instructions (Signed)

## 2017-12-18 ENCOUNTER — Encounter: Payer: Self-pay | Admitting: Emergency Medicine

## 2018-01-13 ENCOUNTER — Encounter: Payer: Self-pay | Admitting: Emergency Medicine

## 2018-01-13 ENCOUNTER — Ambulatory Visit: Payer: Medicare Other | Admitting: Emergency Medicine

## 2018-01-13 ENCOUNTER — Telehealth: Payer: Self-pay | Admitting: Hematology & Oncology

## 2018-01-13 DIAGNOSIS — B370 Candidal stomatitis: Secondary | ICD-10-CM

## 2018-01-13 DIAGNOSIS — J42 Unspecified chronic bronchitis: Secondary | ICD-10-CM | POA: Diagnosis not present

## 2018-01-13 MED ORDER — FLUCONAZOLE 100 MG PO TABS: 100.0000 mg | ORAL_TABLET | Freq: Every day | ORAL | 0 refills | Status: DC

## 2018-01-13 NOTE — Assessment & Plan Note (Signed)
Very severe COPD.  He is overall improved since his prednisone was uptitrated, his oxygen was increased to 6 L/min.  Some discussions were undertaken with him and his daughter regarding chronic nocturnal ventilation.  Is not clear to me that there would be great benefit at his age, given his multiple medical issues but we could revisit it should he show progressive decline.  He has actually rebounded nicely since his prednisone was increased and I do not think we have to discuss it now.  I revisited the issue of possible referral for palliative care and they want to hold off for now. He has held his Trelegy for the last several days due to sore throat and possible thrush.  He does have some plaquing present. Ive asked him to get back on the trilogy soon as possible  We will decrease your prednisone to 15 mg daily.  Pay attention to whether your breathing symptoms change.  We may need to adjust further Try to restart your Trelegy 1 inhalation once daily.  Remember to rinse and gargle after using. Keep your DuoNeb available to use up to every 6 hours if needed for shortness of breath, wheezing, chest tightness Please continue your oxygen at 6 L/min at all times. Continue to wear your CPAP every night reliably. We will defer any change to Trilogy Ventilator at this time. If as we go forward your breathing changes such that you were to need this kind of support, we would need to discuss the pros, cons, benefits of using such a device.  Get the flu shot in the fall  Follow with Dr Lamonte Sakai in 2 months or sooner if you have any problems

## 2018-01-13 NOTE — Telephone Encounter (Signed)
Patients daughter called into to confirm appointment for 8/21

## 2018-01-13 NOTE — Progress Notes (Signed)
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 Was admitted 5/31 with acute dyspnea, found to be in A fib + RVR. Started on amiodarone. Converted to NSR 11/11/12. Scheduled for repeat Ct scan chest (Dr Katheran Awe) for surveillance on 11/25/12. During his hospitalization, we started the eval for his presumed COPD.  He has been on Spiriva before, SABA before, didn't notice any benefit.   ROV 11/06/17 --[name] is 84 with a history of COPD, right middle lobe non-small cell lung cancer that was treated with chemoradiation, hypertension with diastolic dysfunction, remote DVT, obstructive sleep apnea on CPAP.  He has chronic hypoxemic respiratory failure in the setting of all the above.  His course has been complicated by pleural effusions.  He was hospitalized for a recent acute exacerbation and has been out of the hospital for approximately 3 weeks. He declined after his pred was weaned to off - had to go back on pred taper 5/23 > improved again. He is on trelegy, is using duoNeb about 2x a day.  He is having troubble with desaturation on his CPAP machine. Needs a new machine.   ROV 01/13/18 --84 year old man with chronic respiratory failure in the setting of COPD, obstructive sleep apnea, non-small cell lung cancer of the right middle lobe (chemo, radiation), hypertension with diastolic dysfunction, associated pleural effusions.  He has had a very up-and-down 6 months, hospitalizations, prednisone tapers.  We started him on chronic prednisone in May.  This was recently uptitrated to 20 mg daily. His O2 was uptitrated to 6L/min with exertion. He has a new CPAP, is reliable with it and believes that he is benefiting. Paperwork was initiated to possibly obtain a Trilogy vent. He is on trelegy, duoneb prn.    Vitals:   01/13/18 1123  BP: 130/70  Pulse: 78  SpO2: 90%  Weight: 220 lb (99.8 kg)  Height: 5\' 10"  (1.778 m)    Gen: Pleasant, elderly man, in no distress,  normal affect  ENT: No lesions,  mouth clear, some whit discoloration tongue, no postnasal drip  Neck: No JVD, no stridor  Lungs: No use of accessory muscles, coarse B breath sounds  Cardiovascular: RRR, heart sounds normal, no murmur or gallops, 1+ lower extremity ankle edema  Musculoskeletal: No deformities, no cyanosis or clubbing  Neuro: alert, non focal  Skin: Warm, no lesions or rashes    COPD (chronic obstructive pulmonary disease) (HCC) Very severe COPD.  He is overall improved since his prednisone was uptitrated, his oxygen was increased to 6 L/min.  Some discussions were undertaken with him and his daughter regarding chronic nocturnal ventilation.  Is not clear to me that there would be great benefit at his age, given his multiple medical issues but we could revisit it should he show progressive decline.  He has actually rebounded nicely since his prednisone was increased and I do not think we have to discuss it now.  I revisited the issue of possible referral for palliative care and they want to hold off for now. He has held his Trelegy for the last several days due to sore throat and possible thrush.  He does have some plaquing present. Ive asked him to get back on the trilogy soon as possible  We will decrease your prednisone to 15 mg daily.  Pay attention to whether your breathing symptoms change.  We may need to adjust further Try to  restart your Trelegy 1 inhalation once daily.  Remember to rinse and gargle after using. Keep your DuoNeb available to use up to every 6 hours if needed for shortness of breath, wheezing, chest tightness Please continue your oxygen at 6 L/min at all times. Continue to wear your CPAP every night reliably. We will defer any change to Trilogy Ventilator at this time. If as we go forward your breathing changes such that you were to need this kind of support, we would need to discuss the pros, cons,  benefits of using such a device.  Get the flu shot in the fall  Follow with Dr Lamonte Sakai in 2 months or sooner if you have any problems  Thrush Please take fluconazole 100 mg daily for the next 5 days.   Patrick Apo, MD, PhD 01/13/2018, 11:45 AM [facility] [phone] or if no answer [phone]

## 2018-01-13 NOTE — Assessment & Plan Note (Signed)
Please take fluconazole 100 mg daily for the next 5 days.

## 2018-01-13 NOTE — Patient Instructions (Addendum)
Please take fluconazole 100 mg daily for the next 5 days. We will decrease your prednisone to 15 mg daily.  Pay attention to whether your breathing symptoms change.  We may need to adjust further Try to restart your Trelegy 1 inhalation once daily.  Remember to rinse and gargle after using. Keep your DuoNeb available to use up to every 6 hours if needed for shortness of breath, wheezing, chest tightness Please continue your oxygen at 6 L/min at all times. Continue to wear your CPAP every night reliably. We will defer any change to Trilogy Ventilator at this time. If as we go forward your breathing changes such that you were to need this kind of support, we would need to discuss the pros, cons, benefits of using such a device.  Get the flu shot in the fall  Follow with Dr Lamonte Sakai in 2 months or sooner if you have any problems.

## 2018-01-23 ENCOUNTER — Telehealth: Payer: Self-pay | Admitting: Emergency Medicine

## 2018-01-23 NOTE — Telephone Encounter (Signed)
Butch Penny returned phone call; contact # [phone]

## 2018-01-23 NOTE — Telephone Encounter (Signed)
I left Butch Penny a message. Not clear to me that the prednisone would be causing joint pain. In fact, I wonder if the recent decrease in pred from 20 to 15mg  has contributed to increased arthritic pain. Not clear whether we need to decrease or increase the pred.   I asked her to call back. If she Butch Penny wants to try decreasing, then go to 10mg  daily. We will have to follow to see how he responds. Have them call us to let us know.

## 2018-01-23 NOTE — Telephone Encounter (Signed)
Called and spoke with patient's daughter regarding RB recommendations She verbalized understanding and will have pt begin 10mg  prednisone daily She advised that she will call the office next week with update on how pt does with the change. Nothing further needed.

## 2018-01-23 NOTE — Telephone Encounter (Signed)
Called and spoke with Butch Penny and they are wanting to know if pt can titrate down on pred.  Pt is having pain in knees and joints.  Left hip and right knee are hurting so bad and due to this, pt is not wanting to walk. Pt is wanting to titrate down sooner due to this.  Dr. Lamonte Sakai, please advise on this for pt. Thanks!

## 2018-01-28 ENCOUNTER — Other Ambulatory Visit: Payer: Self-pay

## 2018-01-28 ENCOUNTER — Encounter: Payer: Self-pay | Admitting: Hematology & Oncology

## 2018-01-28 ENCOUNTER — Inpatient Hospital Stay: Payer: Medicare Other

## 2018-01-28 ENCOUNTER — Ambulatory Visit (HOSPITAL_BASED_OUTPATIENT_CLINIC_OR_DEPARTMENT_OTHER)
Admission: RE | Admit: 2018-01-28 | Discharge: 2018-01-28 | Disposition: A | Discharge status: Home / Self Care | Payer: Medicare Other | Source: Ambulatory Visit | Attending: Hematology & Oncology | Admitting: Hematology & Oncology

## 2018-01-28 ENCOUNTER — Inpatient Hospital Stay: Payer: Medicare Other | Attending: Hematology & Oncology | Admitting: Hematology & Oncology

## 2018-01-28 VITALS — Resp 20 | Wt 221.0 lb

## 2018-01-28 VITALS — BP 116/61 | HR 70 | Temp 98.0°F

## 2018-01-28 DIAGNOSIS — C3400 Malignant neoplasm of unspecified main bronchus: Secondary | ICD-10-CM | POA: Diagnosis not present

## 2018-01-28 DIAGNOSIS — D508 Other iron deficiency anemias: Secondary | ICD-10-CM | POA: Diagnosis not present

## 2018-01-28 DIAGNOSIS — I480 Paroxysmal atrial fibrillation: Secondary | ICD-10-CM

## 2018-01-28 DIAGNOSIS — J42 Unspecified chronic bronchitis: Secondary | ICD-10-CM

## 2018-01-28 DIAGNOSIS — Z85118 Personal history of other malignant neoplasm of bronchus and lung: Secondary | ICD-10-CM | POA: Diagnosis present

## 2018-01-28 DIAGNOSIS — D500 Iron deficiency anemia secondary to blood loss (chronic): Secondary | ICD-10-CM

## 2018-01-28 DIAGNOSIS — Z7901 Long term (current) use of anticoagulants: Secondary | ICD-10-CM | POA: Insufficient documentation

## 2018-01-28 DIAGNOSIS — M25551 Pain in right hip: Secondary | ICD-10-CM | POA: Insufficient documentation

## 2018-01-28 DIAGNOSIS — K909 Intestinal malabsorption, unspecified: Secondary | ICD-10-CM | POA: Diagnosis not present

## 2018-01-28 DIAGNOSIS — C3401 Malignant neoplasm of right main bronchus: Secondary | ICD-10-CM

## 2018-01-28 DIAGNOSIS — Z86718 Personal history of other venous thrombosis and embolism: Secondary | ICD-10-CM

## 2018-01-28 DIAGNOSIS — Z95828 Presence of other vascular implants and grafts: Secondary | ICD-10-CM

## 2018-01-28 LAB — CMP (CANCER CENTER ONLY)
ALT: 16 U/L (ref 10–47)
ANION GAP: 7 (ref 5–15)
AST: 24 U/L (ref 11–38)
Albumin: 2.9 g/dL — ABNORMAL LOW (ref 3.5–5.0)
Alkaline Phosphatase: 67 U/L (ref 26–84)
BILIRUBIN TOTAL: 0.8 mg/dL (ref 0.2–1.6)
BUN: 20 mg/dL (ref 7–22)
CO2: 30 mmol/L (ref 18–33)
Calcium: 8.8 mg/dL (ref 8.0–10.3)
Chloride: 99 mmol/L (ref 98–108)
Creatinine: 1.1 mg/dL (ref 0.60–1.20)
GLUCOSE: 239 mg/dL — AB (ref 73–118)
POTASSIUM: 3.8 mmol/L (ref 3.3–4.7)
Sodium: 136 mmol/L (ref 128–145)
Total Protein: 6.3 g/dL — ABNORMAL LOW (ref 6.4–8.1)

## 2018-01-28 LAB — CBC WITH DIFFERENTIAL (CANCER CENTER ONLY)
BASOS PCT: 0 %
Basophils Absolute: 0 10*3/uL (ref 0.0–0.1)
Eosinophils Absolute: 0.1 10*3/uL (ref 0.0–0.5)
Eosinophils Relative: 1 %
HEMATOCRIT: 39.5 % (ref 38.7–49.9)
HEMOGLOBIN: 12.7 g/dL — AB (ref 13.0–17.1)
LYMPHS ABS: 1.2 10*3/uL (ref 0.9–3.3)
Lymphocytes Relative: 12 %
MCH: 30 pg (ref 28.0–33.4)
MCHC: 32.2 g/dL (ref 32.0–35.9)
MCV: 93.2 fL (ref 82.0–98.0)
MONO ABS: 0.7 10*3/uL (ref 0.1–0.9)
MONOS PCT: 7 %
NEUTROS ABS: 8.1 10*3/uL — AB (ref 1.5–6.5)
NEUTROS PCT: 80 %
Platelet Count: 185 10*3/uL (ref 145–400)
RBC: 4.24 MIL/uL (ref 4.20–5.70)
RDW: 15.2 % (ref 11.1–15.7)
WBC Count: 10.1 10*3/uL — ABNORMAL HIGH (ref 4.0–10.0)

## 2018-01-28 MED ORDER — SODIUM CHLORIDE 0.9% FLUSH
10.0000 mL | INTRAVENOUS | Status: DC | PRN
  Administered 2018-01-28: 10 mL via INTRAVENOUS
  Filled 2018-01-28: qty 10

## 2018-01-28 MED ORDER — HEPARIN SOD (PORK) LOCK FLUSH 100 UNIT/ML IV SOLN
500.0000 [IU] | Freq: Once | INTRAVENOUS | Status: AC
  Administered 2018-01-28: 500 [IU] via INTRAVENOUS
  Filled 2018-01-28: qty 5

## 2018-01-28 NOTE — Progress Notes (Signed)
Hematology and Oncology Follow Up Visit  [name] [id number] [birth year] 84 y.o. 01/28/2018   Principle Diagnosis:  . 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 2. Paroxysmal atrial fibrillation. 3. 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 4. Iron deficiency anemia due to malabsorption  Current Therapy:   Eliquis 5 mg p.o. B.i.d. IV Feraheme as indicated     Interim History:  [name] is back for followup. He is oxygen dependent. He has bad underlying COPD and also cardiac issues. He has atrial fibrillation. He is on ELIQUIS.  His problem now is that he has pain in the right hip.  This happened about a week ago.  He did not fall.  I worried that he may have avascular necrosis because of his long-term prednisone use.  We will go ahead and get an x-ray of the hip and then make a referral to orthopedic surgery.  He has had no bleeding.  He is on Eliquis.  His iron studies have been doing fairly well.    He has had no fever.  He has not been hospitalized since we last saw him.  Overall, his performance status is ECOG 3.     Medications:  Current Outpatient Medications:  .  acetaminophen (TYLENOL) 325 MG tablet, Take 650 mg by mouth every 4 (four) hours as needed for headache (chills/low grade fever)., Disp: , Rfl:  .  albuterol (PROVENTIL) (2.5 MG/3ML) 0.083% nebulizer solution, Take 3 mLs (2.5 mg total) by nebulization 3 (three) times daily. 3 times daily times 5 days then every 6 hours as needed. (Patient taking differently: Take 2.5 mg by nebulization 3 (three) times daily. ), Disp: 360 mL, Rfl: 0 .  amiodarone (PACERONE) 200 MG tablet, Take 200 mg by mouth See admin instructions. Take one tablet (200 mg) by mouth on Monday, Wednesday, Friday mornings, may also take one tablet (200 mg) as needed for palpitations, Disp: , Rfl:  .  apixaban (ELIQUIS) 5 MG TABS tablet, Take 1 tablet (5 mg total) by mouth 2 (two) times  daily., Disp: 60 tablet, Rfl: 12 .  atorvastatin (LIPITOR) 20 MG tablet, Take 20 mg by mouth at bedtime. , Disp: , Rfl:  .  cetirizine (ZYRTEC) 10 MG tablet, Take 10 mg by mouth daily. , Disp: , Rfl:  .  diltiazem (CARDIZEM CD) 120 MG 24 hr capsule, Take 120 mg by mouth daily., Disp: , Rfl:  .  fluconazole (DIFLUCAN) 100 MG tablet, Take 1 tablet (100 mg total) by mouth daily., Disp: 5 tablet, Rfl: 0 .  Fluticasone-Umeclidin-Vilant (TRELEGY ELLIPTA) 100-62.5-25 MCG/INH AEPB, Inhale 1 puff into the lungs daily., Disp: 180 each, Rfl: 3 .  furosemide (LASIX) 40 MG tablet, Take 40 mg by mouth daily., Disp: , Rfl:  .  glucosamine-chondroitin 500-400 MG tablet, Take 1 tablet by mouth 2 (two) times daily with a meal. , Disp: , Rfl:  .  glucose blood (ONETOUCH VERIO) test strip, , Disp: , Rfl:  .  Insulin Detemir (LEVEMIR FLEXTOUCH) 100 UNIT/ML Pen, Inject 22-26 Units into the skin See admin instructions. Inject 24 units subcutaneously before breakfast, inject 20 units at bedtime, reported 10/29/17., Disp: , Rfl:  .  insulin lispro (HUMALOG) 100 UNIT/ML injection, Inject 6-7 Units into the skin 3 (three) times daily as needed for high blood sugar (CBG >150 (150-250 6 units, >250 7 units)). , Disp: , Rfl:  .  ipratropium-albuterol (DUONEB) 0.5-2.5 (  3) MG/3ML SOLN, Take 3 mLs by nebulization every 6 (six) hours as needed., Disp: 360 mL, Rfl: 1 .  levothyroxine (SYNTHROID, LEVOTHROID) 25 MCG tablet, Take 25 mcg by mouth daily before breakfast., Disp: , Rfl:  .  metFORMIN (GLUCOPHAGE-XR) 500 MG 24 hr tablet, Take 500 mg by mouth 2 (two) times daily after a meal. , Disp: , Rfl:  .  oxybutynin (DITROPAN) 5 MG tablet, Take 5 mg by mouth at bedtime. , Disp: , Rfl:  .  Polyvinyl Alcohol-Povidone (REFRESH OP), Place 1 drop into both eyes daily as needed (dry eyes)., Disp: , Rfl:  .  predniSONE (DELTASONE) 10 MG tablet, Take 10 mg by mouth daily with breakfast. , Disp: , Rfl:  .  PROAIR HFA 108 (90 Base) MCG/ACT  inhaler, INHALE TWO PUFFS BY MOUTH EVERY 6 HOURS AS NEEDED FOR WHEEZING AND FOR SHORTNESS OF BREATH, Disp: 9 each, Rfl: 5 .  temazepam (RESTORIL) 15 MG capsule, TAKE ONE CAPSULE BY MOUTH AT BEDTIME AS NEEDED FOR SLEEP, Disp: 30 capsule, Rfl: 0 No current facility-administered medications for this visit.   Facility-Administered Medications Ordered in Other Visits:  .  sodium chloride flush (NS) 0.9 % injection 10 mL, 10 mL, Intravenous, PRN, Volanda Napoleon, MD, 10 mL at 11/20/16 1153  Allergies: No Known Allergies  Past Medical History, Surgical history, Social history, and Family History were reviewed and updated.  Review of Systems: Review of Systems  Constitutional: Positive for malaise/fatigue.  HENT: Negative.   Eyes: Negative.   Respiratory: Positive for shortness of breath and wheezing.   Cardiovascular: Positive for palpitations and leg swelling.  Gastrointestinal: Positive for nausea.  Genitourinary: Negative.   Musculoskeletal: Positive for joint pain.  Neurological: Positive for dizziness.  Endo/Heme/Allergies: Negative.   Psychiatric/Behavioral: Negative.      Physical Exam:  weight is 221 lb (100.2 kg). His respiration is 20.     Physical Exam  Constitutional: He is oriented to person, place, and time.  HENT:  Head: Normocephalic and atraumatic.  Mouth/Throat: Oropharynx is clear and moist.  Eyes: Pupils are equal, round, and reactive to light. EOM are normal.  Neck: Normal range of motion.  Cardiovascular: Normal rate, regular rhythm and normal heart sounds.  Pulmonary/Chest: Effort normal. He has wheezes.  Abdominal: Soft. Bowel sounds are normal.  Musculoskeletal: Normal range of motion. He exhibits edema. He exhibits no tenderness or deformity.  Lymphadenopathy:    He has no cervical adenopathy.  Neurological: He is alert and oriented to person, place, and time.  Skin: Skin is warm and dry. No rash noted. No erythema.  Psychiatric: He has a normal mood  and affect. His behavior is normal. Judgment and thought content normal.  Vitals reviewed.     Lab Results  Component Value Date   WBC 10.1 (H) 01/28/2018   HGB 12.7 (L) 01/28/2018   HCT 39.5 01/28/2018   MCV 93.2 01/28/2018   PLT 185 01/28/2018     Chemistry      Component Value Date/Time   NA 136 01/28/2018 0920   NA 139 03/26/2017 1007   NA 138 03/27/2016 1025   K 3.8 01/28/2018 0920   K 4.2 03/26/2017 1007   K 4.2 03/27/2016 1025   CL 99 01/28/2018 0920   CL 104 03/26/2017 1007   CO2 30 01/28/2018 0920   CO2 30 03/26/2017 1007   CO2 24 03/27/2016 1025   BUN 20 01/28/2018 0920   BUN 21 03/26/2017 1007   BUN 19.1 03/27/2016  1025   CREATININE 1.10 01/28/2018 0920   CREATININE 1.1 03/26/2017 1007   CREATININE 1.3 03/27/2016 1025      Component Value Date/Time   CALCIUM 8.8 01/28/2018 0920   CALCIUM 9.4 03/26/2017 1007   CALCIUM 8.7 03/27/2016 1025   ALKPHOS 67 01/28/2018 0920   ALKPHOS 99 (H) 03/26/2017 1007   ALKPHOS 85 03/27/2016 1025   AST 24 01/28/2018 0920   AST 28 03/27/2016 1025   ALT 16 01/28/2018 0920   ALT 18 03/26/2017 1007   ALT 17 03/27/2016 1025   BILITOT 0.8 01/28/2018 0920   BILITOT 0.42 03/27/2016 1025         Impression and Plan: [name] is 84 year old gentleman with a history of locally advanced-stage IIIB-adenocarcinoma of the right lung. He had contralateral mediastinal node involvement. He was treated with radiation and chemotherapy. He had a very nice response. He completed  treatment in December of 2012. Thankfully, His disease has not come back. This I am surprised by because of the extensive nature of his locally advanced disease.  We will go ahead and do the x-ray.  Again, I worry about avascular necrosis.  I would like to see him back in a couple months.  I want to make sure that we see him back before he gets to the holiday season.  We will check his iron studies.  Volanda Napoleon, MD 8/21/201910:32 AM

## 2018-01-29 ENCOUNTER — Telehealth: Payer: Self-pay | Admitting: *Deleted

## 2018-01-29 LAB — IRON AND TIBC
Iron: 53 ug/dL (ref 42–163)
SATURATION RATIOS: 17 % — AB (ref 42–163)
TIBC: 309 ug/dL (ref 202–409)
UIBC: 256 ug/dL

## 2018-01-29 LAB — FERRITIN: Ferritin: 149 ng/mL (ref 24–336)

## 2018-01-29 NOTE — Telephone Encounter (Addendum)
Patient is aware of results  ----- Message from Volanda Napoleon, MD sent at 01/28/2018  3:13 PM EDT ----- Call - Hip looks ok on Xray.  May have bursitis!!  Will call Dr. Rhona Raider!!  Laurey Arrow

## 2018-02-02 ENCOUNTER — Other Ambulatory Visit: Payer: Self-pay | Admitting: Family

## 2018-02-02 NOTE — Telephone Encounter (Signed)
Agree with continuing current plan of care, prednisone as discussed at [place]. No clear indication to pursue nocturnal ventilation at this time. Further I am not sure we should pursue going forward if he clinically changes given his age, comorbidities. We will continue to discuss with [name] and his family as we go forward.

## 2018-02-03 ENCOUNTER — Inpatient Hospital Stay: Payer: Medicare Other

## 2018-02-03 ENCOUNTER — Other Ambulatory Visit: Payer: Self-pay

## 2018-02-03 VITALS — BP 104/58 | HR 57 | Temp 97.9°F | Resp 20

## 2018-02-03 DIAGNOSIS — D500 Iron deficiency anemia secondary to blood loss (chronic): Secondary | ICD-10-CM

## 2018-02-03 DIAGNOSIS — Z85118 Personal history of other malignant neoplasm of bronchus and lung: Secondary | ICD-10-CM | POA: Diagnosis not present

## 2018-02-03 DIAGNOSIS — Z95828 Presence of other vascular implants and grafts: Secondary | ICD-10-CM

## 2018-02-03 MED ORDER — SODIUM CHLORIDE 0.9% FLUSH
10.0000 mL | INTRAVENOUS | Status: DC | PRN
  Administered 2018-02-03: 10 mL via INTRAVENOUS
  Filled 2018-02-03: qty 10

## 2018-02-03 MED ORDER — SODIUM CHLORIDE 0.9 % IV SOLN
INTRAVENOUS | Status: DC
  Administered 2018-02-03: 11:00:00 via INTRAVENOUS
  Filled 2018-02-03: qty 250

## 2018-02-03 MED ORDER — SODIUM CHLORIDE 0.9 % IV SOLN
510.0000 mg | Freq: Once | INTRAVENOUS | Status: AC
  Administered 2018-02-03: 510 mg via INTRAVENOUS
  Filled 2018-02-03: qty 17

## 2018-02-03 MED ORDER — HEPARIN SOD (PORK) LOCK FLUSH 100 UNIT/ML IV SOLN
500.0000 [IU] | Freq: Once | INTRAVENOUS | Status: AC
  Administered 2018-02-03: 500 [IU] via INTRAVENOUS
  Filled 2018-02-03: qty 5

## 2018-02-03 NOTE — Patient Instructions (Signed)

## 2018-02-17 ENCOUNTER — Other Ambulatory Visit (HOSPITAL_COMMUNITY): Payer: Self-pay | Admitting: Orthopaedic Surgery

## 2018-02-17 DIAGNOSIS — M545 Low back pain: Secondary | ICD-10-CM

## 2018-02-23 ENCOUNTER — Telehealth: Payer: Self-pay | Admitting: Emergency Medicine

## 2018-02-23 ENCOUNTER — Encounter: Payer: Self-pay | Admitting: *Deleted

## 2018-02-23 NOTE — Telephone Encounter (Signed)
Called spoke with patient's daughter Butch Penny (dpr on file) who requested clarification on current prednisone dosing. Reece Packer that per last documented conversation with RB per 8.16.19 phone note, pt to decrease prednisone down to 10mg  daily.  Per Butch Penny, pt is not having any exacerbation symptoms currently >> denies any increased cough, mucus production, wheezing, dyspnea or tightness in chest.  Butch Penny is aware to contact the office if patient begins showing any symptoms of the above.  E-mail also sent to patient/Donna with the current prednisone dosing  Will sign and forward to Dr Lamonte Sakai so that he is aware patient is tolerating the decreased prednisone dosing of 10mg  daily.

## 2018-03-26 ENCOUNTER — Ambulatory Visit: Payer: Medicare Other | Admitting: Emergency Medicine

## 2018-03-26 ENCOUNTER — Encounter: Payer: Self-pay | Admitting: Emergency Medicine

## 2018-03-26 DIAGNOSIS — J42 Unspecified chronic bronchitis: Secondary | ICD-10-CM | POA: Diagnosis not present

## 2018-03-26 DIAGNOSIS — J9611 Chronic respiratory failure with hypoxia: Secondary | ICD-10-CM

## 2018-03-26 DIAGNOSIS — C3400 Malignant neoplasm of unspecified main bronchus: Secondary | ICD-10-CM

## 2018-03-26 NOTE — Assessment & Plan Note (Signed)
Follow-up imaging with Dr. Marin Olp.  No evidence of recurrence.  He is getting serial chest x-rays

## 2018-03-26 NOTE — Progress Notes (Signed)
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 Was admitted 5/31 with acute dyspnea, found to be in A fib + RVR. Started on amiodarone. Converted to NSR 11/11/12. Scheduled for repeat Ct scan chest (Dr Katheran Awe) for surveillance on 11/25/12. During his hospitalization, we started the eval for his presumed COPD.  He has been on Spiriva before, SABA before, didn't notice any benefit.   ROV 11/06/17 --[name] is 85 with a history of COPD, right middle lobe non-small cell lung cancer that was treated with chemoradiation, hypertension with diastolic dysfunction, remote DVT, obstructive sleep apnea on CPAP.  He has chronic hypoxemic respiratory failure in the setting of all the above.  His course has been complicated by pleural effusions.  He was hospitalized for a recent acute exacerbation and has been out of the hospital for approximately 3 weeks. He declined after his pred was weaned to off - had to go back on pred taper 5/23 > improved again. He is on trelegy, is using duoNeb about 2x a day.  He is having troubble with desaturation on his CPAP machine. Needs a new machine.   ROV 01/13/18 --84 year old man with chronic respiratory failure in the setting of COPD, obstructive sleep apnea, non-small cell lung cancer of the right middle lobe (chemo, radiation), hypertension with diastolic dysfunction, associated pleural effusions.  He has had a very up-and-down 6 months, hospitalizations, prednisone tapers.  We started him on chronic prednisone in May.  This was recently uptitrated to 20 mg daily. His O2 was uptitrated to 6L/min with exertion. He has a new CPAP, is reliable with it and believes that he is benefiting. Paperwork was initiated to possibly obtain a Trilogy vent. He is on trelegy, duoneb prn.   ROV 03/26/18 --follow-up visit for 84 year old gentleman with COPD, obstructive sleep apnea, right middle lobe  non-small cell lung cancer for which she completed chemoradiation.  Also with chronic diastolic dysfunction and some associated pleural effusions.  He has chronic hypoxemic respiratory failure in the setting of all the above.  He had to come off Trelegy due to thrush, we restarted it 2 months ago.  He also was treated with fluconazole.  He has DuoNeb's available and is using them approximately.  I decreased his prednisone to 15 mg daily at his last visit, then to 10mg . He appears to have tolerated. Feels that his breathing is actually better. He is tolerating the Trelegy now. His O2 is at 5-6 L/min. Dr Katheran Awe is following CXR's.    Vitals:   03/26/18 1057  BP: 118/70  Pulse: 63  SpO2: 97%  Weight: 102.5 kg  Height: 5\' 10"  (1.778 m)   Gen: Pleasant, elderly man, in no distress,  normal affect  ENT: No lesions,  mouth clear, some whit discoloration tongue, no postnasal drip  Neck: No JVD, no stridor  Lungs: No use of accessory muscles, bibasilar insp crackles. No wheeze.   Cardiovascular: RRR, heart sounds normal, no murmur or gallops, trace to 1+ lower extremity ankle edema  Musculoskeletal: No deformities, no cyanosis or clubbing  Neuro: alert, non focal  Skin: Warm, no lesions or rashes    COPD (chronic obstructive pulmonary disease) ([name]) He is interested in trying to wean prednisone to off based on side effects, hyperglycemia.  He is tolerated a decrease from 20 mg to 10 mg since last visit.  I am okay with try to make  this transition although unclear as to whether he will develop urinary symptoms as he decreases.  He will keep surveillance for this.  He will also keep surveillance for possible signs of adrenal insufficiency we reviewed these today.  Please decrease your prednisone to 5 mg daily for the next 2 weeks.  If you tolerate this without difficulty then you can try stopping it altogether.  Please keep track of with your breathing changes as you come off the medication.   Also you need to keep track for any signs of a problem called adrenal insufficiency.  This can show up as severe weakness, malaise, lack of energy, changes in your thinking or awareness.  Call our office if you have any problems like this off the prednisone. Continue Trelegy as you have been taking it.  Remember to rinse and gargle after using. Keep your albuterol available to use up to every 4 hours if you needed for shortness of breath, wheezing, chest tightness. Flu shot is up-to-date.   Follow with Dr. Lamonte Sakai in January 2020 or sooner if you have any problems  Lung cancer Follow-up imaging with Dr. Marin Olp.  No evidence of recurrence.  He is getting serial chest x-rays  Chronic respiratory failure with hypoxia (HCC) With significant hypoxemia.  Continue oxygen at 6 L/min at all times.   [name], MD, PhD 03/26/2018, 11:21 AM [facility] [phone] or if no answer [phone]

## 2018-03-26 NOTE — Assessment & Plan Note (Signed)
He is interested in trying to wean prednisone to off based on side effects, hyperglycemia.  He is tolerated a decrease from 20 mg to 10 mg since last visit.  I am okay with try to make this transition although unclear as to whether he will develop urinary symptoms as he decreases.  He will keep surveillance for this.  He will also keep surveillance for possible signs of adrenal insufficiency we reviewed these today.  Please decrease your prednisone to 5 mg daily for the next 2 weeks.  If you tolerate this without difficulty then you can try stopping it altogether.  Please keep track of with your breathing changes as you come off the medication.  Also you need to keep track for any signs of a problem called adrenal insufficiency.  This can show up as severe weakness, malaise, lack of energy, changes in your thinking or awareness.  Call our office if you have any problems like this off the prednisone. Continue Trelegy as you have been taking it.  Remember to rinse and gargle after using. Keep your albuterol available to use up to every 4 hours if you needed for shortness of breath, wheezing, chest tightness. Flu shot is up-to-date.   Follow with Dr. Lamonte Sakai in January 2020 or sooner if you have any problems

## 2018-03-26 NOTE — Assessment & Plan Note (Signed)
With significant hypoxemia.  Continue oxygen at 6 L/min at all times.

## 2018-03-26 NOTE — Patient Instructions (Signed)
Please decrease your prednisone to 5 mg daily for the next 2 weeks.  If you tolerate this without difficulty then you can try stopping it altogether.  Please keep track of with your breathing changes as you come off the medication.  Also you need to keep track for any signs of a problem called adrenal insufficiency.  This can show up as severe weakness, malaise, lack of energy, changes in your thinking or awareness.  Call our office if you have any problems like this off the prednisone. Continue Trelegy as you have been taking it.  Remember to rinse and gargle after using. Keep your albuterol available to use up to every 4 hours if you needed for shortness of breath, wheezing, chest tightness. Flu shot is up-to-date.   Continue oxygen at 6 L/min at all times Follow with Dr. Marin Olp as planned Follow with Dr. Lamonte Sakai in January 2020 or sooner if you have any problems

## 2018-04-06 ENCOUNTER — Other Ambulatory Visit: Payer: Self-pay

## 2018-04-06 ENCOUNTER — Inpatient Hospital Stay: Payer: Medicare Other

## 2018-04-06 ENCOUNTER — Inpatient Hospital Stay: Payer: Medicare Other | Attending: Hematology & Oncology | Admitting: Hematology & Oncology

## 2018-04-06 ENCOUNTER — Encounter: Payer: Self-pay | Admitting: Hematology & Oncology

## 2018-04-06 VITALS — BP 111/60 | HR 80 | Temp 97.6°F | Resp 21 | Wt 230.0 lb

## 2018-04-06 DIAGNOSIS — J449 Chronic obstructive pulmonary disease, unspecified: Secondary | ICD-10-CM | POA: Diagnosis not present

## 2018-04-06 DIAGNOSIS — K909 Intestinal malabsorption, unspecified: Secondary | ICD-10-CM

## 2018-04-06 DIAGNOSIS — Z85118 Personal history of other malignant neoplasm of bronchus and lung: Secondary | ICD-10-CM

## 2018-04-06 DIAGNOSIS — Z86718 Personal history of other venous thrombosis and embolism: Secondary | ICD-10-CM | POA: Diagnosis not present

## 2018-04-06 DIAGNOSIS — D508 Other iron deficiency anemias: Secondary | ICD-10-CM

## 2018-04-06 DIAGNOSIS — Z7901 Long term (current) use of anticoagulants: Secondary | ICD-10-CM | POA: Diagnosis not present

## 2018-04-06 DIAGNOSIS — C3400 Malignant neoplasm of unspecified main bronchus: Secondary | ICD-10-CM

## 2018-04-06 DIAGNOSIS — Z992 Dependence on renal dialysis: Secondary | ICD-10-CM

## 2018-04-06 DIAGNOSIS — I480 Paroxysmal atrial fibrillation: Secondary | ICD-10-CM | POA: Diagnosis not present

## 2018-04-06 DIAGNOSIS — D500 Iron deficiency anemia secondary to blood loss (chronic): Secondary | ICD-10-CM

## 2018-04-06 DIAGNOSIS — Z95828 Presence of other vascular implants and grafts: Secondary | ICD-10-CM

## 2018-04-06 LAB — CBC WITH DIFFERENTIAL (CANCER CENTER ONLY)
Abs Immature Granulocytes: 0.1 10*3/uL — ABNORMAL HIGH (ref 0.00–0.07)
BASOS ABS: 0 10*3/uL (ref 0.0–0.1)
Basophils Relative: 0 %
EOS ABS: 0 10*3/uL (ref 0.0–0.5)
EOS PCT: 0 %
HCT: 43.1 % (ref 39.0–52.0)
Hemoglobin: 13.5 g/dL (ref 13.0–17.0)
IMMATURE GRANULOCYTES: 1 %
LYMPHS ABS: 0.9 10*3/uL (ref 0.7–4.0)
Lymphocytes Relative: 7 %
MCH: 29.3 pg (ref 26.0–34.0)
MCHC: 31.3 g/dL (ref 30.0–36.0)
MCV: 93.5 fL (ref 80.0–100.0)
Monocytes Absolute: 0.7 10*3/uL (ref 0.1–1.0)
Monocytes Relative: 5 %
NEUTROS PCT: 87 %
NRBC: 0 % (ref 0.0–0.2)
Neutro Abs: 11.1 10*3/uL — ABNORMAL HIGH (ref 1.7–7.7)
PLATELETS: 219 10*3/uL (ref 150–400)
RBC: 4.61 MIL/uL (ref 4.22–5.81)
RDW: 15 % (ref 11.5–15.5)
WBC: 12.9 10*3/uL — AB (ref 4.0–10.5)

## 2018-04-06 LAB — CMP (CANCER CENTER ONLY)
ALBUMIN: 3.8 g/dL (ref 3.5–5.0)
ALT: 20 U/L (ref 10–47)
AST: 24 U/L (ref 11–38)
Alkaline Phosphatase: 98 U/L — ABNORMAL HIGH (ref 26–84)
Anion gap: 3 — ABNORMAL LOW (ref 5–15)
BUN: 23 mg/dL — AB (ref 7–22)
CALCIUM: 8.9 mg/dL (ref 8.0–10.3)
CHLORIDE: 106 mmol/L (ref 98–108)
CO2: 30 mmol/L (ref 18–33)
CREATININE: 1.2 mg/dL (ref 0.60–1.20)
GLUCOSE: 226 mg/dL — AB (ref 73–118)
Potassium: 3.9 mmol/L (ref 3.3–4.7)
Sodium: 139 mmol/L (ref 128–145)
Total Bilirubin: 0.7 mg/dL (ref 0.2–1.6)
Total Protein: 6.8 g/dL (ref 6.4–8.1)

## 2018-04-06 MED ORDER — SODIUM CHLORIDE 0.9% FLUSH
10.0000 mL | INTRAVENOUS | Status: DC | PRN
  Administered 2018-04-06: 10 mL via INTRAVENOUS
  Filled 2018-04-06: qty 10

## 2018-04-06 MED ORDER — HEPARIN SOD (PORK) LOCK FLUSH 100 UNIT/ML IV SOLN
500.0000 [IU] | Freq: Once | INTRAVENOUS | Status: AC
  Administered 2018-04-06: 500 [IU] via INTRAVENOUS
  Filled 2018-04-06: qty 5

## 2018-04-06 NOTE — Patient Instructions (Signed)
Implanted Port Insertion, Care After °This sheet gives you information about how to care for yourself after your procedure. Your health care provider may also give you more specific instructions. If you have problems or questions, contact your health care provider. °What can I expect after the procedure? °After your procedure, it is common to have: °· Discomfort at the port insertion site. °· Bruising on the skin over the port. This should improve over 3-4 days. ° °Follow these instructions at home: °Port care °· After your port is placed, you will get a manufacturer's information card. The card has information about your port. Keep this card with you at all times. °· Take care of the port as told by your health care provider. Ask your health care provider if you or a family member can get training for taking care of the port at home. A home health care nurse may also take care of the port. °· Make sure to remember what type of port you have. °Incision care °· Follow instructions from your health care provider about how to take care of your port insertion site. Make sure you: °? Wash your hands with soap and water before you change your bandage (dressing). If soap and water are not available, use hand sanitizer. °? Change your dressing as told by your health care provider. °? Leave stitches (sutures), skin glue, or adhesive strips in place. These skin closures may need to stay in place for 2 weeks or longer. If adhesive strip edges start to loosen and curl up, you may trim the loose edges. Do not remove adhesive strips completely unless your health care provider tells you to do that. °· Check your port insertion site every day for signs of infection. Check for: °? More redness, swelling, or pain. °? More fluid or blood. °? Warmth. °? Pus or a bad smell. °General instructions °· Do not take baths, swim, or use a hot tub until your health care provider approves. °· Do not lift anything that is heavier than 10 lb (4.5  kg) for a week, or as told by your health care provider. °· Ask your health care provider when it is okay to: °? Return to work or school. °? Resume usual physical activities or sports. °· Do not drive for 24 hours if you were given a medicine to help you relax (sedative). °· Take over-the-counter and prescription medicines only as told by your health care provider. °· Wear a medical alert bracelet in case of an emergency. This will tell any health care providers that you have a port. °· Keep all follow-up visits as told by your health care provider. This is important. °Contact a health care provider if: °· You cannot flush your port with saline as directed, or you cannot draw blood from the port. °· You have a fever or chills. °· You have more redness, swelling, or pain around your port insertion site. °· You have more fluid or blood coming from your port insertion site. °· Your port insertion site feels warm to the touch. °· You have pus or a bad smell coming from the port insertion site. °Get help right away if: °· You have chest pain or shortness of breath. °· You have bleeding from your port that you cannot control. °Summary °· Take care of the port as told by your health care provider. °· Change your dressing as told by your health care provider. °· Keep all follow-up visits as told by your health care provider. °  This information is not intended to replace advice given to you by your health care provider. Make sure you discuss any questions you have with your health care provider. °Document Released: 03/17/2013 Document Revised: 04/17/2016 Document Reviewed: 04/17/2016 °Elsevier Interactive Patient Education © 2017 [employer] ° °

## 2018-04-06 NOTE — Progress Notes (Signed)
Hematology and Oncology Follow Up Visit  [name] [id number] 11-Feb-1925 84 y.o. 04/06/2018   Principle Diagnosis:  . 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 2. Paroxysmal atrial fibrillation. 3. 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 4. Iron deficiency anemia due to malabsorption  Current Therapy:   Eliquis 5 mg p.o. B.i.d. IV Feraheme as indicated     Interim History:  [name] is back for followup. He is oxygen dependent. He has bad underlying COPD and also cardiac issues. He has atrial fibrillation. He is on ELIQUIS.  His problem now is that he has pain in the left hip.  This happened about a week ago.  He did not fall.  I worried that he may have avascular necrosis because of his long-term prednisone use.  We will go ahead and get an x-ray of the hip and then make a referral to orthopedic surgery.  He has had no bleeding.  He is on Eliquis.  His iron studies have been doing fairly well.    He has had no fever.  He has not been hospitalized since we last saw him.  Overall, his performance status is ECOG 3.     Medications:  Current Outpatient Medications:  .  acetaminophen (TYLENOL) 325 MG tablet, Take 650 mg by mouth every 4 (four) hours as needed for headache (chills/Schmidt grade fever)., Disp: , Rfl:  .  albuterol (PROVENTIL) (2.5 MG/3ML) 0.083% nebulizer solution, Take 3 mLs (2.5 mg total) by nebulization 3 (three) times daily. 3 times daily times 5 days then every 6 hours as needed. (Patient taking differently: Take 2.5 mg by nebulization 3 (three) times daily. ), Disp: 360 mL, Rfl: 0 .  amiodarone (PACERONE) 200 MG tablet, Take 200 mg by mouth See admin instructions. Take one tablet (200 mg) by mouth on Monday, Wednesday, Friday mornings, may also take one tablet (200 mg) as needed for palpitations, Disp: , Rfl:  .  apixaban (ELIQUIS) 5 MG TABS tablet, Take 1 tablet (5 mg total) by mouth 2 (two) times  daily., Disp: 60 tablet, Rfl: 12 .  atorvastatin (LIPITOR) 20 MG tablet, Take 20 mg by mouth at bedtime. , Disp: , Rfl:  .  cetirizine (ZYRTEC) 10 MG tablet, Take 10 mg by mouth daily. , Disp: , Rfl:  .  diltiazem (CARDIZEM CD) 120 MG 24 hr capsule, Take 120 mg by mouth daily., Disp: , Rfl:  .  Fluticasone-Umeclidin-Vilant (TRELEGY ELLIPTA) 100-62.5-25 MCG/INH AEPB, Inhale 1 puff into the lungs daily., Disp: 180 each, Rfl: 3 .  furosemide (LASIX) 40 MG tablet, Take 40 mg by mouth daily., Disp: , Rfl:  .  glucosamine-chondroitin 500-400 MG tablet, Take 1 tablet by mouth 2 (two) times daily with a meal. , Disp: , Rfl:  .  glucose blood (ONETOUCH VERIO) test strip, , Disp: , Rfl:  .  Insulin Detemir (LEVEMIR FLEXTOUCH) 100 UNIT/ML Pen, Inject 22-26 Units into the skin See admin instructions. Inject 24 units subcutaneously before breakfast, inject 20 units at bedtime, reported 10/29/17., Disp: , Rfl:  .  insulin lispro (HUMALOG) 100 UNIT/ML injection, Inject 6-7 Units into the skin 3 (three) times daily as needed for high blood sugar (CBG >150 (150-250 6 units, >250 7 units)). , Disp: , Rfl:  .  ipratropium-albuterol (DUONEB) 0.5-2.5 (3) MG/3ML SOLN, Take 3 mLs by nebulization every 6 (six) hours as needed., Disp: 360 mL, Rfl: 1 .  levothyroxine (SYNTHROID, LEVOTHROID) 25 MCG tablet, Take 25 mcg by mouth daily before breakfast., Disp: , Rfl:  .  metFORMIN (GLUCOPHAGE-XR) 500 MG 24 hr tablet, Take 500 mg by mouth 2 (two) times daily after a meal. , Disp: , Rfl:  .  oxybutynin (DITROPAN) 5 MG tablet, Take 5 mg by mouth at bedtime. , Disp: , Rfl:  .  Polyvinyl Alcohol-Povidone (REFRESH OP), Place 1 drop into both eyes daily as needed (dry eyes)., Disp: , Rfl:  .  predniSONE (DELTASONE) 10 MG tablet, Take 10 mg by mouth daily with breakfast. , Disp: , Rfl:  .  PROAIR HFA 108 (90 Base) MCG/ACT inhaler, INHALE TWO PUFFS BY MOUTH EVERY 6 HOURS AS NEEDED FOR WHEEZING AND FOR SHORTNESS OF BREATH, Disp: 9 each,  Rfl: 5 .  temazepam (RESTORIL) 15 MG capsule, TAKE ONE CAPSULE BY MOUTH AT BEDTIME AS NEEDED FOR SLEEP, Disp: 30 capsule, Rfl: 0 No current facility-administered medications for this visit.   Facility-Administered Medications Ordered in Other Visits:  .  sodium chloride flush (NS) 0.9 % injection 10 mL, 10 mL, Intravenous, PRN, Volanda Napoleon, MD, 10 mL at 11/20/16 1153  Allergies: No Known Allergies  Past Medical History, Surgical history, Social history, and Family History were reviewed and updated.  Review of Systems: Review of Systems  Constitutional: Positive for malaise/fatigue.  HENT: Negative.   Eyes: Negative.   Respiratory: Positive for shortness of breath and wheezing.   Cardiovascular: Positive for palpitations and leg swelling.  Gastrointestinal: Positive for nausea.  Genitourinary: Negative.   Musculoskeletal: Positive for joint pain.  Neurological: Positive for dizziness.  Endo/Heme/Allergies: Negative.   Psychiatric/Behavioral: Negative.      Physical Exam:  weight is 230 lb (104.3 kg). His oral temperature is 97.6 F (36.4 C). His blood pressure is 111/60 and his pulse is 80. His respiration is 21 (abnormal) and oxygen saturation is 91%.     Physical Exam  Constitutional: He is oriented to person, place, and time.  HENT:  Head: Normocephalic and atraumatic.  Mouth/Throat: Oropharynx is clear and moist.  Eyes: Pupils are equal, round, and reactive to light. EOM are normal.  Neck: Normal range of motion.  Cardiovascular: Normal rate, regular rhythm and normal heart sounds.  Pulmonary/Chest: Effort normal. He has wheezes.  Abdominal: Soft. Bowel sounds are normal.  Musculoskeletal: Normal range of motion. He exhibits edema. He exhibits no tenderness or deformity.  Lymphadenopathy:    He has no cervical adenopathy.  Neurological: He is alert and oriented to person, place, and time.  Skin: Skin is warm and dry. No rash noted. No erythema.  Psychiatric: He  has a normal mood and affect. His behavior is normal. Judgment and thought content normal.  Vitals reviewed.     Lab Results  Component Value Date   WBC 12.9 (H) 04/06/2018   HGB 13.5 04/06/2018   HCT 43.1 04/06/2018   MCV 93.5 04/06/2018   PLT 219 04/06/2018     Chemistry      Component Value Date/Time   NA 139 04/06/2018 1215   NA 139 03/26/2017 1007   NA 138 03/27/2016 1025   K 3.9 04/06/2018 1215   K 4.2 03/26/2017 1007   K 4.2 03/27/2016 1025   CL 106 04/06/2018 1215   CL 104 03/26/2017 1007   CO2 30 04/06/2018 1215   CO2 30 03/26/2017 1007   CO2 24 03/27/2016 1025   BUN 23 (H) 04/06/2018 1215   BUN 21 03/26/2017 1007  BUN 19.1 03/27/2016 1025   CREATININE 1.20 04/06/2018 1215   CREATININE 1.1 03/26/2017 1007   CREATININE 1.3 03/27/2016 1025      Component Value Date/Time   CALCIUM 8.9 04/06/2018 1215   CALCIUM 9.4 03/26/2017 1007   CALCIUM 8.7 03/27/2016 1025   ALKPHOS 98 (H) 04/06/2018 1215   ALKPHOS 99 (H) 03/26/2017 1007   ALKPHOS 85 03/27/2016 1025   AST 24 04/06/2018 1215   AST 28 03/27/2016 1025   ALT 20 04/06/2018 1215   ALT 18 03/26/2017 1007   ALT 17 03/27/2016 1025   BILITOT 0.7 04/06/2018 1215   BILITOT 0.42 03/27/2016 1025         Impression and Plan: [name] is 84 year old gentleman with a history of locally advanced-stage IIIB-adenocarcinoma of the right lung. He had contralateral mediastinal node involvement. He was treated with radiation and chemotherapy. He had a very nice response. He completed  treatment in December of 2012. Thankfully, His disease has not come back. This I am surprised by because of the extensive nature of his locally advanced disease.  We will go ahead and do the x-ray.  Again, I worry about avascular necrosis.  I would like to see him back in a 3 months.  I want to make sure that we see him back before he gets thru the holiday season.  We will check his iron studies.  Volanda Napoleon, MD 10/28/20191:43 PM

## 2018-04-07 ENCOUNTER — Telehealth: Payer: Self-pay | Admitting: *Deleted

## 2018-04-07 LAB — IRON AND TIBC
Iron: 80 ug/dL (ref 42–163)
SATURATION RATIOS: 25 % — AB (ref 42–163)
TIBC: 313 ug/dL (ref 202–409)
UIBC: 233 ug/dL

## 2018-04-07 LAB — FERRITIN: Ferritin: 115 ng/mL (ref 24–336)

## 2018-04-07 NOTE — Telephone Encounter (Addendum)
-----   Message from Volanda Napoleon, MD sent at 04/07/2018  1:25 PM EDT ----- Call - the iron is actually a little low!!  Need to give 1 dose of iron. Appt made with daughter for iron

## 2018-04-14 ENCOUNTER — Inpatient Hospital Stay: Payer: Medicare Other | Attending: Hematology & Oncology

## 2018-04-14 VITALS — BP 108/62 | HR 70 | Temp 97.6°F | Resp 18

## 2018-04-14 DIAGNOSIS — K909 Intestinal malabsorption, unspecified: Secondary | ICD-10-CM | POA: Diagnosis not present

## 2018-04-14 DIAGNOSIS — D500 Iron deficiency anemia secondary to blood loss (chronic): Secondary | ICD-10-CM

## 2018-04-14 DIAGNOSIS — D508 Other iron deficiency anemias: Secondary | ICD-10-CM | POA: Insufficient documentation

## 2018-04-14 MED ORDER — HEPARIN SOD (PORK) LOCK FLUSH 100 UNIT/ML IV SOLN
500.0000 [IU] | Freq: Once | INTRAVENOUS | Status: AC
  Administered 2018-04-14: 500 [IU] via INTRAVENOUS
  Filled 2018-04-14: qty 5

## 2018-04-14 MED ORDER — SODIUM CHLORIDE 0.9% FLUSH
10.0000 mL | INTRAVENOUS | Status: DC | PRN
  Administered 2018-04-14: 10 mL via INTRAVENOUS
  Filled 2018-04-14: qty 10

## 2018-04-14 MED ORDER — SODIUM CHLORIDE 0.9 % IV SOLN
INTRAVENOUS | Status: DC
  Administered 2018-04-14: 11:00:00 via INTRAVENOUS
  Filled 2018-04-14: qty 250

## 2018-04-14 MED ORDER — SODIUM CHLORIDE 0.9 % IV SOLN
510.0000 mg | Freq: Once | INTRAVENOUS | Status: AC
  Administered 2018-04-14: 510 mg via INTRAVENOUS
  Filled 2018-04-14: qty 17

## 2018-04-14 NOTE — Patient Instructions (Signed)

## 2018-04-21 ENCOUNTER — Telehealth: Payer: Self-pay

## 2018-04-21 NOTE — Telephone Encounter (Signed)
Spoke with the patient's daughter regarding patient assistance for eliquis. Per the daughter the patient does not need the form filled out until next year after he has met his deductible. The patient is also due for follow up, an appointment was offered. The daughter stated that they would think about setting an appointment and call back.

## 2018-05-01 ENCOUNTER — Telehealth: Payer: Self-pay | Admitting: Emergency Medicine

## 2018-05-01 NOTE — Telephone Encounter (Signed)
Spoke with [name], he is having another copd exacerbation. His PCP gave him Prednisone 20 mg for 7 days and :levaquin but feels he needs to go back taking Prednisone daily again to reduce his exacerbations. He wants to try this when he finishes his 7 day course. RB please advise.  [facility] [place]   Patient Instructions by Collene Gobble, MD at 03/26/2018 10:30 AM  Author: Collene Gobble, MD Author Type: Physician Filed: 03/26/2018 11:19 AM  Note Status: Signed Cosign: Cosign Not Required Encounter Date: 03/26/2018  Editor: Collene Gobble, MD (Physician)    Please decrease your prednisone to 5 mg daily for the next 2 weeks.  If you tolerate this without difficulty then you can try stopping it altogether.  Please keep track of with your breathing changes as you come off the medication.  Also you need to keep track for any signs of a problem called adrenal insufficiency.  This can show up as severe weakness, malaise, lack of energy, changes in your thinking or awareness.  Call our office if you have any problems like this off the prednisone. Continue Trelegy as you have been taking it.  Remember to rinse and gargle after using. Keep your albuterol available to use up to every 4 hours if you needed for shortness of breath, wheezing, chest tightness. Flu shot is up-to-date.   Continue oxygen at 6 L/min at all times Follow with Dr. Marin Olp as planned Follow with Dr. Lamonte Sakai in January 2020 or sooner if you have any problems    Instructions   Please decrease your prednisone to 5 mg daily for the next 2 weeks.  If you tolerate this without difficulty then you can try stopping it altogether.  Please keep track of with your breathing changes as you come off the medication.  Also you need to keep track for any signs of a problem called adrenal insufficiency.  This can show up as severe weakness, malaise, lack of energy, changes in your thinking or awareness.  Call our office if you have any problems like  this off the prednisone. Continue Trelegy as you have been taking it.  Remember to rinse and gargle after using.

## 2018-05-06 MED ORDER — PREDNISONE 10 MG PO TABS: ORAL_TABLET | ORAL | 0 refills | Status: DC

## 2018-05-06 NOTE — Telephone Encounter (Signed)
Called and spoke to patient daughter Butch Penny, advised of RB recommendations. Voiced understanding. Asked that we send enough prednisone to carry over until office visit. Script sent. Appointment made for 12.02.19 at 9:15am for follow up. Nothing further is needed at this time.

## 2018-05-06 NOTE — Telephone Encounter (Signed)
Have him complete the taper down to a dose of 5mg  daily and then stay on that dose. Have him set up an OV w me to discuss how he is doing on the 5mg 

## 2018-05-11 ENCOUNTER — Ambulatory Visit: Payer: Medicare Other | Admitting: Emergency Medicine

## 2018-05-17 ENCOUNTER — Emergency Department (HOSPITAL_COMMUNITY): Payer: Medicare Other

## 2018-05-17 ENCOUNTER — Inpatient Hospital Stay (HOSPITAL_COMMUNITY)
Admission: EM | Admit: 2018-05-17 | Discharge: 2018-05-20 | DRG: 871 | Disposition: A | Discharge status: Home Health | Payer: Medicare Other | Attending: Internal Medicine | Admitting: Internal Medicine

## 2018-05-17 ENCOUNTER — Other Ambulatory Visit: Payer: Self-pay

## 2018-05-17 ENCOUNTER — Encounter (HOSPITAL_COMMUNITY): Payer: Self-pay | Admitting: *Deleted

## 2018-05-17 DIAGNOSIS — E1165 Type 2 diabetes mellitus with hyperglycemia: Secondary | ICD-10-CM | POA: Diagnosis present

## 2018-05-17 DIAGNOSIS — J9690 Respiratory failure, unspecified, unspecified whether with hypoxia or hypercapnia: Secondary | ICD-10-CM | POA: Diagnosis present

## 2018-05-17 DIAGNOSIS — E039 Hypothyroidism, unspecified: Secondary | ICD-10-CM | POA: Diagnosis not present

## 2018-05-17 DIAGNOSIS — J209 Acute bronchitis, unspecified: Secondary | ICD-10-CM | POA: Diagnosis present

## 2018-05-17 DIAGNOSIS — I509 Heart failure, unspecified: Secondary | ICD-10-CM

## 2018-05-17 DIAGNOSIS — Z79890 Hormone replacement therapy (postmenopausal): Secondary | ICD-10-CM | POA: Diagnosis not present

## 2018-05-17 DIAGNOSIS — J440 Chronic obstructive pulmonary disease with acute lower respiratory infection: Secondary | ICD-10-CM | POA: Diagnosis present

## 2018-05-17 DIAGNOSIS — E785 Hyperlipidemia, unspecified: Secondary | ICD-10-CM | POA: Diagnosis present

## 2018-05-17 DIAGNOSIS — Z7901 Long term (current) use of anticoagulants: Secondary | ICD-10-CM | POA: Diagnosis not present

## 2018-05-17 DIAGNOSIS — Z79899 Other long term (current) drug therapy: Secondary | ICD-10-CM | POA: Diagnosis not present

## 2018-05-17 DIAGNOSIS — I480 Paroxysmal atrial fibrillation: Secondary | ICD-10-CM | POA: Diagnosis present

## 2018-05-17 DIAGNOSIS — Z86718 Personal history of other venous thrombosis and embolism: Secondary | ICD-10-CM | POA: Diagnosis not present

## 2018-05-17 DIAGNOSIS — J9621 Acute and chronic respiratory failure with hypoxia: Secondary | ICD-10-CM | POA: Diagnosis not present

## 2018-05-17 DIAGNOSIS — Z794 Long term (current) use of insulin: Secondary | ICD-10-CM | POA: Diagnosis not present

## 2018-05-17 DIAGNOSIS — G4733 Obstructive sleep apnea (adult) (pediatric): Secondary | ICD-10-CM | POA: Diagnosis present

## 2018-05-17 DIAGNOSIS — N400 Enlarged prostate without lower urinary tract symptoms: Secondary | ICD-10-CM | POA: Diagnosis present

## 2018-05-17 DIAGNOSIS — J441 Chronic obstructive pulmonary disease with (acute) exacerbation: Secondary | ICD-10-CM | POA: Diagnosis not present

## 2018-05-17 DIAGNOSIS — Z87891 Personal history of nicotine dependence: Secondary | ICD-10-CM

## 2018-05-17 DIAGNOSIS — E1122 Type 2 diabetes mellitus with diabetic chronic kidney disease: Secondary | ICD-10-CM | POA: Diagnosis not present

## 2018-05-17 DIAGNOSIS — R0602 Shortness of breath: Secondary | ICD-10-CM | POA: Diagnosis present

## 2018-05-17 DIAGNOSIS — N179 Acute kidney failure, unspecified: Secondary | ICD-10-CM | POA: Diagnosis present

## 2018-05-17 DIAGNOSIS — I5032 Chronic diastolic (congestive) heart failure: Secondary | ICD-10-CM | POA: Diagnosis present

## 2018-05-17 DIAGNOSIS — Z9221 Personal history of antineoplastic chemotherapy: Secondary | ICD-10-CM

## 2018-05-17 DIAGNOSIS — I5033 Acute on chronic diastolic (congestive) heart failure: Secondary | ICD-10-CM | POA: Diagnosis present

## 2018-05-17 DIAGNOSIS — Z923 Personal history of irradiation: Secondary | ICD-10-CM | POA: Diagnosis not present

## 2018-05-17 DIAGNOSIS — Z85118 Personal history of other malignant neoplasm of bronchus and lung: Secondary | ICD-10-CM

## 2018-05-17 DIAGNOSIS — N183 Chronic kidney disease, stage 3 (moderate): Secondary | ICD-10-CM | POA: Diagnosis present

## 2018-05-17 DIAGNOSIS — T380X5A Adverse effect of glucocorticoids and synthetic analogues, initial encounter: Secondary | ICD-10-CM | POA: Diagnosis present

## 2018-05-17 DIAGNOSIS — E119 Type 2 diabetes mellitus without complications: Secondary | ICD-10-CM | POA: Diagnosis not present

## 2018-05-17 DIAGNOSIS — I130 Hypertensive heart and chronic kidney disease with heart failure and stage 1 through stage 4 chronic kidney disease, or unspecified chronic kidney disease: Secondary | ICD-10-CM | POA: Diagnosis present

## 2018-05-17 DIAGNOSIS — A419 Sepsis, unspecified organism: Secondary | ICD-10-CM | POA: Diagnosis present

## 2018-05-17 DIAGNOSIS — R0902 Hypoxemia: Secondary | ICD-10-CM

## 2018-05-17 DIAGNOSIS — Z9981 Dependence on supplemental oxygen: Secondary | ICD-10-CM | POA: Diagnosis not present

## 2018-05-17 DIAGNOSIS — Z7952 Long term (current) use of systemic steroids: Secondary | ICD-10-CM

## 2018-05-17 DIAGNOSIS — E1142 Type 2 diabetes mellitus with diabetic polyneuropathy: Secondary | ICD-10-CM | POA: Diagnosis present

## 2018-05-17 DIAGNOSIS — J9601 Acute respiratory failure with hypoxia: Secondary | ICD-10-CM | POA: Diagnosis not present

## 2018-05-17 DIAGNOSIS — Z7951 Long term (current) use of inhaled steroids: Secondary | ICD-10-CM | POA: Diagnosis not present

## 2018-05-17 LAB — CBC WITH DIFFERENTIAL/PLATELET
Abs Immature Granulocytes: 0.13 10*3/uL — ABNORMAL HIGH (ref 0.00–0.07)
BASOS ABS: 0 10*3/uL (ref 0.0–0.1)
Basophils Relative: 0 %
Eosinophils Absolute: 0 10*3/uL (ref 0.0–0.5)
Eosinophils Relative: 0 %
HCT: 44.1 % (ref 39.0–52.0)
Hemoglobin: 14.2 g/dL (ref 13.0–17.0)
IMMATURE GRANULOCYTES: 1 %
Lymphocytes Relative: 5 %
Lymphs Abs: 1 10*3/uL (ref 0.7–4.0)
MCH: 30 pg (ref 26.0–34.0)
MCHC: 32.2 g/dL (ref 30.0–36.0)
MCV: 93 fL (ref 80.0–100.0)
Monocytes Absolute: 1 10*3/uL (ref 0.1–1.0)
Monocytes Relative: 5 %
NRBC: 0 % (ref 0.0–0.2)
Neutro Abs: 19.7 10*3/uL — ABNORMAL HIGH (ref 1.7–7.7)
Neutrophils Relative %: 89 %
Platelets: 208 10*3/uL (ref 150–400)
RBC: 4.74 MIL/uL (ref 4.22–5.81)
RDW: 14.2 % (ref 11.5–15.5)
WBC: 21.8 10*3/uL — AB (ref 4.0–10.5)

## 2018-05-17 LAB — BASIC METABOLIC PANEL
ANION GAP: 17 — AB (ref 5–15)
BUN: 28 mg/dL — ABNORMAL HIGH (ref 8–23)
CO2: 23 mmol/L (ref 22–32)
Calcium: 8.6 mg/dL — ABNORMAL LOW (ref 8.9–10.3)
Chloride: 93 mmol/L — ABNORMAL LOW (ref 98–111)
Creatinine, Ser: 1.68 mg/dL — ABNORMAL HIGH (ref 0.61–1.24)
GFR calc Af Amer: 41 mL/min — ABNORMAL LOW (ref >60)
GFR calc non Af Amer: 35 mL/min — ABNORMAL LOW (ref >60)
Glucose, Bld: 385 mg/dL — ABNORMAL HIGH (ref 70–99)
Potassium: 3.6 mmol/L (ref 3.5–5.1)
Sodium: 133 mmol/L — ABNORMAL LOW (ref 135–145)

## 2018-05-17 LAB — I-STAT TROPONIN, ED: Troponin i, poc: 0 ng/mL (ref 0.00–0.08)

## 2018-05-17 LAB — URINALYSIS, ROUTINE W REFLEX MICROSCOPIC
Bacteria, UA: NONE SEEN
Bilirubin Urine: NEGATIVE
Hgb urine dipstick: NEGATIVE
Ketones, ur: NEGATIVE mg/dL
LEUKOCYTES UA: NEGATIVE
Nitrite: NEGATIVE
PH: 5 (ref 5.0–8.0)
Protein, ur: NEGATIVE mg/dL
Specific Gravity, Urine: 1.009 (ref 1.005–1.030)

## 2018-05-17 LAB — I-STAT CG4 LACTIC ACID, ED
Lactic Acid, Venous: 4.37 mmol/L (ref 0.5–1.9)
Lactic Acid, Venous: 2.44 mmol/L (ref 0.5–1.9)

## 2018-05-17 LAB — BRAIN NATRIURETIC PEPTIDE: B NATRIURETIC PEPTIDE 5: 240.6 pg/mL — AB (ref 0.0–100.0)

## 2018-05-17 LAB — I-STAT ARTERIAL BLOOD GAS, ED
Acid-Base Excess: 2 mmol/L (ref 0.0–2.0)
Bicarbonate: 27.2 mmol/L (ref 20.0–28.0)
O2 Saturation: 99 %
PCO2 ART: 43.3 mmHg (ref 32.0–48.0)
Patient temperature: 100.2
TCO2: 28 mmol/L (ref 22–32)
pH, Arterial: 7.41 (ref 7.350–7.450)
pO2, Arterial: 137 mmHg — ABNORMAL HIGH (ref 83.0–108.0)

## 2018-05-17 LAB — CBG MONITORING, ED: Glucose-Capillary: 317 mg/dL — ABNORMAL HIGH (ref 70–99)

## 2018-05-17 MED ORDER — GLUCOSAMINE-CHONDROITIN 500-400 MG PO TABS: 1.0000 | ORAL_TABLET | Freq: Two times a day (BID) | ORAL | Status: DC

## 2018-05-17 MED ORDER — OXYBUTYNIN CHLORIDE 5 MG PO TABS
5.0000 mg | ORAL_TABLET | Freq: Every day | ORAL | Status: DC
  Administered 2018-05-18 – 2018-05-19 (×3): 5 mg via ORAL
  Filled 2018-05-17 (×3): qty 1

## 2018-05-17 MED ORDER — INSULIN DETEMIR 100 UNIT/ML ~~LOC~~ SOLN
20.0000 [IU] | Freq: Every day | SUBCUTANEOUS | Status: DC
  Administered 2018-05-17: 20 [IU] via SUBCUTANEOUS
  Filled 2018-05-17: qty 0.2

## 2018-05-17 MED ORDER — ACETAMINOPHEN 325 MG PO TABS: 650.0000 mg | ORAL_TABLET | ORAL | Status: DC | PRN

## 2018-05-17 MED ORDER — HYDROCODONE-ACETAMINOPHEN 5-325 MG PO TABS: 1.0000 | ORAL_TABLET | ORAL | Status: DC | PRN

## 2018-05-17 MED ORDER — SODIUM CHLORIDE 0.9 % IV BOLUS (SEPSIS)
1000.0000 mL | Freq: Once | INTRAVENOUS | Status: AC
  Administered 2018-05-17: 1000 mL via INTRAVENOUS

## 2018-05-17 MED ORDER — SODIUM CHLORIDE 0.9 % IV SOLN
2.0000 g | Freq: Once | INTRAVENOUS | Status: AC
  Administered 2018-05-17: 2 g via INTRAVENOUS
  Filled 2018-05-17: qty 2

## 2018-05-17 MED ORDER — SODIUM CHLORIDE 0.9 % IV SOLN
500.0000 mg | INTRAVENOUS | Status: DC
  Administered 2018-05-17 – 2018-05-19 (×3): 500 mg via INTRAVENOUS
  Filled 2018-05-17 (×3): qty 500

## 2018-05-17 MED ORDER — ALBUTEROL SULFATE (2.5 MG/3ML) 0.083% IN NEBU: 2.5000 mg | INHALATION_SOLUTION | RESPIRATORY_TRACT | Status: DC | PRN

## 2018-05-17 MED ORDER — SODIUM CHLORIDE 0.9% FLUSH: 3.0000 mL | INTRAVENOUS | Status: DC | PRN

## 2018-05-17 MED ORDER — SODIUM CHLORIDE 0.9 % IV SOLN
250.0000 mL | INTRAVENOUS | Status: DC | PRN
  Administered 2018-05-18: 250 mL via INTRAVENOUS

## 2018-05-17 MED ORDER — ALBUTEROL SULFATE (2.5 MG/3ML) 0.083% IN NEBU
2.5000 mg | INHALATION_SOLUTION | Freq: Three times a day (TID) | RESPIRATORY_TRACT | Status: DC
  Administered 2018-05-17: 2.5 mg via RESPIRATORY_TRACT
  Filled 2018-05-17: qty 3

## 2018-05-17 MED ORDER — LEVOTHYROXINE SODIUM 25 MCG PO TABS
25.0000 ug | ORAL_TABLET | Freq: Every day | ORAL | Status: DC
  Administered 2018-05-18 – 2018-05-20 (×3): 25 ug via ORAL
  Filled 2018-05-17 (×3): qty 1

## 2018-05-17 MED ORDER — DILTIAZEM HCL ER COATED BEADS 120 MG PO CP24
120.0000 mg | ORAL_CAPSULE | Freq: Every day | ORAL | Status: DC
  Administered 2018-05-18 – 2018-05-20 (×3): 120 mg via ORAL
  Filled 2018-05-17 (×4): qty 1

## 2018-05-17 MED ORDER — ALBUTEROL (5 MG/ML) CONTINUOUS INHALATION SOLN
5.0000 mg/h | INHALATION_SOLUTION | Freq: Once | RESPIRATORY_TRACT | Status: AC
  Administered 2018-05-17: 5 mg/h via RESPIRATORY_TRACT
  Filled 2018-05-17: qty 20

## 2018-05-17 MED ORDER — ONDANSETRON HCL 4 MG/2ML IJ SOLN: 4.0000 mg | Freq: Four times a day (QID) | INTRAMUSCULAR | Status: DC | PRN

## 2018-05-17 MED ORDER — GUAIFENESIN ER 600 MG PO TB12
600.0000 mg | ORAL_TABLET | Freq: Two times a day (BID) | ORAL | Status: DC
  Administered 2018-05-18 – 2018-05-20 (×6): 600 mg via ORAL
  Filled 2018-05-17 (×6): qty 1

## 2018-05-17 MED ORDER — METHYLPREDNISOLONE SODIUM SUCC 125 MG IJ SOLR
60.0000 mg | Freq: Four times a day (QID) | INTRAMUSCULAR | Status: DC
  Administered 2018-05-17 – 2018-05-18 (×2): 60 mg via INTRAVENOUS
  Filled 2018-05-17 (×2): qty 2

## 2018-05-17 MED ORDER — METHYLPREDNISOLONE SODIUM SUCC 125 MG IJ SOLR
125.0000 mg | Freq: Once | INTRAMUSCULAR | Status: AC
  Administered 2018-05-17: 125 mg via INTRAVENOUS
  Filled 2018-05-17: qty 2

## 2018-05-17 MED ORDER — ATORVASTATIN CALCIUM 10 MG PO TABS
20.0000 mg | ORAL_TABLET | Freq: Every day | ORAL | Status: DC
  Administered 2018-05-17 – 2018-05-19 (×3): 20 mg via ORAL
  Filled 2018-05-17 (×3): qty 2

## 2018-05-17 MED ORDER — INSULIN DETEMIR 100 UNIT/ML ~~LOC~~ SOLN
24.0000 [IU] | Freq: Every day | SUBCUTANEOUS | Status: DC
  Administered 2018-05-18: 24 [IU] via SUBCUTANEOUS
  Filled 2018-05-17: qty 0.24

## 2018-05-17 MED ORDER — SODIUM CHLORIDE 0.9% FLUSH
3.0000 mL | Freq: Two times a day (BID) | INTRAVENOUS | Status: DC
  Administered 2018-05-18 – 2018-05-20 (×5): 3 mL via INTRAVENOUS

## 2018-05-17 MED ORDER — ACETAMINOPHEN 650 MG RE SUPP: 650.0000 mg | Freq: Four times a day (QID) | RECTAL | Status: DC | PRN

## 2018-05-17 MED ORDER — INSULIN DETEMIR 100 UNIT/ML FLEXPEN: 20.0000 [IU] | PEN_INJECTOR | SUBCUTANEOUS | Status: DC

## 2018-05-17 MED ORDER — SODIUM CHLORIDE 0.9 % IV SOLN
INTRAVENOUS | Status: AC
  Administered 2018-05-17: via INTRAVENOUS

## 2018-05-17 MED ORDER — INSULIN ASPART 100 UNIT/ML ~~LOC~~ SOLN
0.0000 [IU] | Freq: Every day | SUBCUTANEOUS | Status: DC
  Administered 2018-05-17: 4 [IU] via SUBCUTANEOUS
  Administered 2018-05-18: 2 [IU] via SUBCUTANEOUS
  Administered 2018-05-19: 5 [IU] via SUBCUTANEOUS

## 2018-05-17 MED ORDER — SODIUM CHLORIDE 0.9 % IV BOLUS: 500.0000 mL | Freq: Once | INTRAVENOUS | Status: DC

## 2018-05-17 MED ORDER — SODIUM CHLORIDE 0.9 % IV SOLN
2.0000 g | INTRAVENOUS | Status: DC
  Administered 2018-05-18: 2 g via INTRAVENOUS
  Filled 2018-05-17 (×2): qty 2

## 2018-05-17 MED ORDER — APIXABAN 5 MG PO TABS
5.0000 mg | ORAL_TABLET | Freq: Two times a day (BID) | ORAL | Status: DC
  Administered 2018-05-18 – 2018-05-20 (×6): 5 mg via ORAL
  Filled 2018-05-17 (×7): qty 1

## 2018-05-17 MED ORDER — TEMAZEPAM 15 MG PO CAPS
15.0000 mg | ORAL_CAPSULE | Freq: Every day | ORAL | Status: DC
  Administered 2018-05-17 – 2018-05-19 (×3): 15 mg via ORAL
  Filled 2018-05-17 (×4): qty 1

## 2018-05-17 MED ORDER — ACETAMINOPHEN 325 MG PO TABS
650.0000 mg | ORAL_TABLET | Freq: Four times a day (QID) | ORAL | Status: DC | PRN
  Administered 2018-05-19: 650 mg via ORAL
  Filled 2018-05-17: qty 2

## 2018-05-17 MED ORDER — SODIUM CHLORIDE 0.9 % IV BOLUS (SEPSIS): 1000.0000 mL | Freq: Once | INTRAVENOUS | Status: DC

## 2018-05-17 MED ORDER — IPRATROPIUM BROMIDE 0.02 % IN SOLN
0.5000 mg | RESPIRATORY_TRACT | Status: AC
  Administered 2018-05-17: 0.5 mg via RESPIRATORY_TRACT
  Filled 2018-05-17: qty 2.5

## 2018-05-17 MED ORDER — INSULIN ASPART 100 UNIT/ML ~~LOC~~ SOLN
0.0000 [IU] | Freq: Three times a day (TID) | SUBCUTANEOUS | Status: DC
  Administered 2018-05-18: 3 [IU] via SUBCUTANEOUS
  Administered 2018-05-18: 9 [IU] via SUBCUTANEOUS
  Administered 2018-05-18: 5 [IU] via SUBCUTANEOUS
  Administered 2018-05-19: 2 [IU] via SUBCUTANEOUS
  Administered 2018-05-19 (×2): 3 [IU] via SUBCUTANEOUS
  Administered 2018-05-20: 2 [IU] via SUBCUTANEOUS

## 2018-05-17 MED ORDER — SODIUM CHLORIDE 0.9 % IV SOLN: 500.0000 mg | INTRAVENOUS | Status: DC

## 2018-05-17 MED ORDER — AMIODARONE HCL 200 MG PO TABS: 200.0000 mg | ORAL_TABLET | ORAL | Status: DC

## 2018-05-17 MED ORDER — ONDANSETRON HCL 4 MG PO TABS: 4.0000 mg | ORAL_TABLET | Freq: Four times a day (QID) | ORAL | Status: DC | PRN

## 2018-05-17 NOTE — ED Notes (Signed)
Dr Sabra Heck notified of Lactic of 4.3.

## 2018-05-17 NOTE — ED Notes (Signed)
Paged floor coverage blount, Sydny Schnitzler

## 2018-05-17 NOTE — ED Notes (Signed)
MD sent a message regarding lasix order

## 2018-05-17 NOTE — ED Triage Notes (Signed)
Pt here pov for sob x 3 days.   Hx of copd, chf, lung ca and is on 6L normally.   84% on 6L.

## 2018-05-17 NOTE — H&P (Signed)
[facility]                                                                                    Patient Demographics  [name], is a 84 y.o. male  CSN: [id number]  MRN: [id number]  DOB - 10/29/1924  Admit Date - 05/17/2018  Outpatient Primary MD for the patient is Avva, Steva Ready, MD   With History of -  Past Medical History:  Diagnosis Date  . Acute diastolic CHF (congestive heart failure) ([place])   . Allergic conjunctivitis   . Allergic rhinitis   . Atrial fibrillation ([place])   . BPH (benign prostatic hyperplasia)   . Cataract   . Cellulitis    hx  . COPD (chronic obstructive pulmonary disease) ([place])       . Diabetic peripheral neuropathy ([place])   . Diverticulosis    hx  . DJD (degenerative joint disease)    Left Knee  . DVT (deep venous thrombosis) ([place])    IN RIGHT ARM 11/2010   . Hearing loss   . Hyperlipidemia   . Hypertension   . Iron deficiency anemia 03/27/2016  . Iron deficiency anemia due to chronic blood loss 03/27/2016  . Lung cancer ([place])     history of stage IIIA non-small cell lung cancer who is currently being treated with both  Radiation and Chemotherapy  . Malabsorption of iron 03/27/2016  . Obstructive sleep apnea (adult)  10/29/2012   This patient has mild obstructive sleep apnea, tested in March 2014 at [facility] sleep. He had been a CPAP user for 14 years. AHi 10.7 He was titrated to a pressure of 9 cm water( after auto - titration). CPAP at night  --- 8-10 YR AGO....,OSA-diagnosed 2000  . Pneumonia    history  . Secondary diabetes with peripheral neuropathy ([place]) 11/07/2012      Past Surgical History:  Procedure Laterality Date  . CARDIAC CATHETERIZATION  2006  . deviated septum repair    . FIBEROPTIC BRONCHOSCOPY  12/11/2010  . Insertion of a left subclavian Port-A-Cath.  12/17/10   [place]  . PORTACATH PLACEMENT  04/23/2011   Procedure: INSERTION PORT-A-CATH;  Surgeon: Pierre Bali, MD;  Location: [place];  Service:  Thoracic;;  Revision of  Porta-Cath  . VIDEO BRONCHOSCOPY  09/27/2011   Procedure: VIDEO BRONCHOSCOPY;  Surgeon: Nicanor Alcon, MD;  Location: [place];  Service: Thoracic;  Laterality: N/A;    in for   Chief Complaint  Patient presents with  . Shortness of Breath     HPI  [name]  is a 84 y.o. 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  Reports fever and chills at home.  In the emergency room his chest x-ray was positive for COPD, and interstitial lung disease with no acute changes, his exam showed scattered rhonchi and his lactic acid was elevated at 4.37.  Patient was started on BiPAP.  IV cefepime and Zithromax were started in addition to neb treatments.  Patient reports some pleuritic chest pain episodes yesterday but not today.  Patient has nausea but no vomiting, no diarrhea.  Patient lives alone at home and his daughter was at bedside during decision making.    Review of Systems    In addition to the HPI above,   No Headache, No changes with Vision or hearing, No problems swallowing food or Liquids, No Abdominal pain, No Nausea or Vommitting, Bowel movements are regular, No Blood in stool or Urine, No dysuria, No new skin rashes or bruises, No new joints pains-aches,  No new weakness tingling, numbness in any extremity, No recent weight gain or loss, No polyuria, polydypsia or polyphagia, No significant Mental Stressors.  A full 10 point Review of Systems was done, except as stated above, all other Review of Systems were negative.   Social History Social History   Tobacco Use  . Smoking status: Former Smoker    Packs/day: 1.00    Years: 50.00    Pack years: 50.00    Types: Cigarettes    Start  date: 05/18/1943    Last attempt to quit: 06/10/1992    Years since quitting: 25.9  . Smokeless tobacco: Never Used  . Tobacco comment: quit tobacco 3 years ago  Substance Use Topics  . Alcohol use: No    Alcohol/week: 0.0 standard drinks     Family History Family History  Problem Relation Age of Onset  . Coronary artery disease Unknown   . Cancer Unknown   . Multiple sclerosis Unknown   . Diabetes type II Unknown   . Anesthesia problems Neg Hx   . Hypotension Neg Hx   . Malignant hyperthermia Neg Hx   . Pseudochol deficiency Neg Hx      Prior to Admission medications   Medication Sig Start Date End Date Taking? Authorizing Provider  acetaminophen (TYLENOL) 325 MG tablet Take 650 mg by mouth every 4 (four) hours as needed for headache (chills/low grade fever).   Yes [provider]  albuterol (PROVENTIL) (2.5 MG/3ML) 0.083% nebulizer solution Take 3 mLs (2.5 mg total) by nebulization 3 (three) times daily. 3 times daily times 5 days then every 6 hours as needed. Patient taking differently: Take 2.5 mg by nebulization 3 (three) times daily.  09/16/17  Yes Eugenie Filler, MD  amiodarone (PACERONE) 200 MG tablet Take 200 mg by mouth See admin instructions. Take one tablet (200 mg) by mouth on Monday, Wednesday, Friday mornings, may also take one tablet (200 mg) as needed for palpitations   Yes [provider]  apixaban (ELIQUIS) 5 MG TABS tablet Take 1 tablet (5 mg total) by mouth 2 (two) times daily. 11/12/12  Yes Jacolyn Reedy, MD  atorvastatin (LIPITOR) 20 MG tablet Take 20 mg by mouth at bedtime.  03/05/17  Yes [provider]  cetirizine (ZYRTEC) 10 MG tablet Take 10 mg by mouth daily.    Yes [provider]  diltiazem (CARDIZEM CD) 120 MG 24 hr capsule Take 120 mg by mouth daily.   Yes [provider]  Fluticasone-Umeclidin-Vilant (TRELEGY ELLIPTA) 100-62.5-25 MCG/INH AEPB Inhale 1 puff into the lungs daily. 12/12/17  Yes Collene Gobble, MD  furosemide (LASIX) 40 MG tablet Take 80 mg by mouth daily.    Yes [provider]  glucosamine-chondroitin 500-400 MG tablet Take 1 tablet by mouth 2 (two) times daily with a meal.    Yes [provider]  Insulin Detemir (LEVEMIR FLEXTOUCH) 100 UNIT/ML Pen Inject 20-24 Units into the skin See admin instructions. Inject 24 units subcutaneously before breakfast, inject 20 units at bedtime, reported 10/29/17.   Yes [provider]  insulin lispro (HUMALOG) 100 UNIT/ML injection Inject 6-7 Units into the skin 3 (three) times daily as needed for high blood sugar (CBG >150 (150-250 6 units, >250 7 units)).    Yes [provider]  ipratropium-albuterol (DUONEB) 0.5-2.5 (3) MG/3ML SOLN Take 3 mLs by nebulization every 6 (six) hours as needed. Patient taking differently: Take 3 mLs by nebulization every 6 (six) hours as needed (sob, wheezing).  10/09/17  Yes Amin, Jeanella Flattery, MD  levothyroxine (SYNTHROID, LEVOTHROID) 25 MCG tablet Take 25 mcg by mouth daily before breakfast.   Yes [provider]  metFORMIN (GLUCOPHAGE-XR) 500 MG 24 hr tablet Take 500 mg by mouth 2 (two) times daily after a meal.  07/25/16  Yes [provider]  oxybutynin (DITROPAN) 5 MG tablet Take 5 mg by mouth at bedtime.    Yes [provider]  Polyvinyl Alcohol-Povidone (REFRESH OP) Place 1 drop into both eyes at bedtime.    Yes [provider]  predniSONE (DELTASONE) 10 MG tablet Take 5mg  (1/2 tablet) until next office visit. 05/06/18  Yes Byrum, Rose Fillers, MD  PROAIR HFA 108 [phone] Base) MCG/ACT inhaler INHALE TWO PUFFS BY MOUTH EVERY 6 HOURS AS NEEDED FOR WHEEZING AND FOR SHORTNESS OF BREATH Patient taking differently: Inhale 2 puffs into the lungs every 6 (six) hours as needed for wheezing or shortness of breath.  04/10/17  Yes Byrum, Rose Fillers, MD  temazepam (RESTORIL) 15 MG capsule TAKE ONE CAPSULE BY MOUTH AT BEDTIME AS NEEDED FOR SLEEP Patient taking  differently: Take 15 mg by mouth at bedtime.  02/02/14  Yes Volanda Napoleon, MD  glucose blood (ONETOUCH VERIO) test strip  09/23/17   [provider]    No Known Allergies  Physical Exam  Vitals  Blood pressure (!) 118/57, pulse 80, temperature 100.2 F (37.9 C), temperature source Rectal, resp. rate 20, height 5\' 10"  (1.778 m), weight 96.6 kg, SpO2 94 %.   1. General elderly male, looks tired.  Well-developed, well-nourished  2. Normal affect and insight, Not Suicidal or Homicidal, Awake Alert, Oriented X 3.  3. No F.N deficits, grossly, patient moving all extremities  4. Ears and Eyes appear Normal, Conjunctivae clear, PERRLA. Moist Oral Mucosa.  5. Supple Neck, No JVD, No cervical lymphadenopathy appriciated, No Carotid Bruits.  6. Symmetrical Chest wall movement, scattered rhonchi  7.  Irregular irregular, No Gallops, Rubs or Murmurs, No Parasternal Heave.  8. Positive Bowel Sounds, Abdomen Soft, Non tender, No organomegaly appriciated,No rebound -guarding or rigidity.  9.  No Cyanosis, Normal Skin Turgor, No Skin Rash or Bruise.  10. Good muscle tone,  joints appear normal , no effusions, Normal ROM.    Data Review  CBC Recent Labs  Lab 05/17/18 1613  WBC 21.8*  HGB 14.2  HCT 44.1  PLT 208  MCV 93.0  MCH 30.0  MCHC 32.2  RDW 14.2  LYMPHSABS 1.0  MONOABS 1.0  EOSABS 0.0  BASOSABS 0.0   ------------------------------------------------------------------------------------------------------------------  Chemistries  Recent Labs  Lab 05/17/18 1613  NA 133*  K 3.6  CL 93*  CO2 23  GLUCOSE 385*  BUN 28*  CREATININE 1.68*  CALCIUM 8.6*   ------------------------------------------------------------------------------------------------------------------ estimated creatinine clearance is 33.4 mL/min (A) (by C-G formula based on SCr of 1.68 mg/dL  (  H)). ------------------------------------------------------------------------------------------------------------------ No results for input(s): TSH, T4TOTAL, T3FREE, THYROIDAB in the last 72 hours.  Invalid input(s): FREET3   Coagulation profile No results for input(s): INR, PROTIME in the last 168 hours. ------------------------------------------------------------------------------------------------------------------- No results for input(s): DDIMER in the last 72 hours. -------------------------------------------------------------------------------------------------------------------  Cardiac Enzymes No results for input(s): CKMB, TROPONINI, MYOGLOBIN in the last 168 hours.  Invalid input(s): CK ------------------------------------------------------------------------------------------------------------------ Invalid input(s): POCBNP   ---------------------------------------------------------------------------------------------------------------  Urinalysis    Component Value Date/Time   COLORURINE AMBER (A) 05/07/2015 1742   APPEARANCEUR CLEAR 05/07/2015 1742   LABSPEC 1.030 05/07/2015 1742   PHURINE 5.5 05/07/2015 1742   GLUCOSEU 100 (A) 05/07/2015 1742   HGBUR NEGATIVE 05/07/2015 1742   BILIRUBINUR NEGATIVE 05/07/2015 1742   KETONESUR NEGATIVE 05/07/2015 1742   PROTEINUR NEGATIVE 05/07/2015 1742   UROBILINOGEN 0.2 12/09/2010 1650   NITRITE NEGATIVE 05/07/2015 1742   LEUKOCYTESUR NEGATIVE 05/07/2015 1742    ----------------------------------------------------------------------------------------------------------------   Imaging results:   Dg Chest Portable 1 View  Result Date: 05/17/2018 CLINICAL DATA:  Pts family states that pt has been having SOB and CP for 2 days. Pt is a previous smoker and had a mass in his lung that was treated w/o surgery. EXAM: PORTABLE CHEST 1 VIEW COMPARISON:  11/27/2017 FINDINGS: Cardiac silhouette is normal in size. No mediastinal or  hilar masses. There are thickened bronchovascular and interstitial markings most evident in the lung bases, stable compared to the prior exam. No discrete lung mass. No convincing pneumonia and no evidence of pulmonary edema. No pleural effusion or pneumothorax. Left anterior chest wall Port-A-Cath is stable, tip in the lower superior vena cava. Skeletal structures are grossly intact. IMPRESSION: 1. No acute findings. 2. Chronic changes of COPD and interstitial lung disease. Electronically Signed   By: Lajean Manes M.D.   On: 05/17/2018 16:53    My personal review of EKG: Atrial fibrillation with a rate of 53 bpm with PVCs and nonspecific intraventricular conduction delay  Assessment & Plan  Respiratory failure with history of COPD on 6 L/min at baseline There is an element of bronchitis/early pneumonia with elevated lactic acid level Start the patient on BiPAP/IV cefepime and Zithromax Check sputum culture Slow hydration at 50 mL/h with continuing to monitor, due to history of congestive heart failure  COPD exacerbation IV Solu-Medrol/nebulizer treatments  A. fib with controlled rate Continue with Eliquis and rate control with amiodarone  Diastolic congestive heart failure and on 10/2017 with ejection fraction 55 to 31% and mild diastolic dysfunction, hold Lasix due to dehydration  Acute renal insufficiency Slow hydration, hold Lasix  Diabetes mellitus, insulin-dependent Hold metformin due to renal insufficiency continue with insulin sliding scale  Hyperlipidemia; continue with Lipitor   DVT Prophylaxis Eliquis  AM Labs Ordered, also please review Full Orders  Family Communication: Admission, patients condition and plan of care including tests being ordered have been discussed with the patient and daughter who indicate understanding and agree with the plan and Code Status.  Code Status full  Disposition Plan: To be determined  Time spent in minutes : 46 minutes  Condition  GUARDED   @SIGNATURE @

## 2018-05-17 NOTE — Progress Notes (Signed)
Pharmacy Antibiotic Note  GEVORK AYYAD is a 84 y.o. male admitted on 05/17/2018 with pneumonia.  Pharmacy has been consulted for cefepime dosing.  Plan: Start cefepime 2g IV Q24h Monitor clinical picture, renal function F/U C&S, abx deescalation / LOT  Height: 5\' 10"  (177.8 cm) Weight: 213 lb (96.6 kg) IBW/kg (Calculated) : 73  Temp (24hrs), Avg:100.2 F (37.9 C), Min:100.2 F (37.9 C), Max:100.2 F (37.9 C)  No results for input(s): WBC, CREATININE, LATICACIDVEN, VANCOTROUGH, VANCOPEAK, VANCORANDOM, GENTTROUGH, GENTPEAK, GENTRANDOM, TOBRATROUGH, TOBRAPEAK, TOBRARND, AMIKACINPEAK, AMIKACINTROU, AMIKACIN in the last 168 hours.  CrCl cannot be calculated (Patient's most recent lab result is older than the maximum 21 days allowed.).    No Known Allergies  Thank you for allowing pharmacy to be a part of this patient's care.  Reginia Naas 05/17/2018 4:03 PM

## 2018-05-17 NOTE — ED Notes (Signed)
RT unable to administer breathing tx at this time.

## 2018-05-17 NOTE — ED Provider Notes (Addendum)
[facility] EMERGENCY DEPARTMENT Provider Note   CSN: [id number] Arrival date & time: 05/17/18  1540     History   Chief Complaint Chief Complaint  Patient presents with  . Shortness of Breath    HPI [name] is a 84 y.o. male.  Patient is a 84 year old male with past medical history significant for congestive heart failure, lung cancer, COPD on 6 L nasal cannula at baseline presenting for worsening shortness of breath for the past 2 days.  Patient states that he has had difficulty ambulating to the bathroom because of extreme shortness of breath despite being on his baseline nasal cannula oxygen but has gotten progressively worse in the last 2 to 3 days.  Patient was wrapped up in a blanket sitting in front of fire this morning when son found him.  Daughter states that patient has been having chills for the past 2 weeks at night and in the morning.  Patient does not take his temperature.  Patient states that he has a chronic cough that is the consistency of glue and yellow in color.  The history is provided by the patient and a relative. No language interpreter was used.    Past Medical History:  Diagnosis Date  . Acute diastolic CHF (congestive heart failure) ([place])   . Allergic conjunctivitis   . Allergic rhinitis   . Atrial fibrillation ([place])   . BPH (benign prostatic hyperplasia)   . Cataract   . Cellulitis    hx  . COPD (chronic obstructive pulmonary disease) ([place])       . Diabetic peripheral neuropathy ([place])   . Diverticulosis    hx  . DJD (degenerative joint disease)    Left Knee  . DVT (deep venous thrombosis) ([place])    IN RIGHT ARM 11/2010   . Hearing loss   . Hyperlipidemia   . Hypertension   . Iron deficiency anemia 03/27/2016  . Iron deficiency anemia due to chronic blood loss 03/27/2016  . Lung cancer ([place])     history of stage IIIA non-small cell lung cancer who is currently being treated with both  Radiation and Chemotherapy  .  Malabsorption of iron 03/27/2016  . Obstructive sleep apnea (adult)  10/29/2012   This patient has mild obstructive sleep apnea, tested in March 2014 at [facility] sleep. He had been a CPAP user for 14 years. AHi 10.7 He was titrated to a pressure of 9 cm water( after auto - titration). CPAP at night  --- 8-10 YR AGO....,OSA-diagnosed 2000  . Pneumonia    history  . Secondary diabetes with peripheral neuropathy ([place]) 11/07/2012    Patient Active Problem List   Diagnosis Date Noted  . Respiratory failure ([place]) 05/17/2018  . Thrush 01/13/2018  . Chronic diastolic (congestive) heart failure ([place]) 10/06/2017  . GERD (gastroesophageal reflux disease) 10/06/2017  . Hypothyroidism 10/06/2017  . COPD with acute exacerbation ([place]) 10/06/2017  . Chronic respiratory failure with hypoxia ([place]) 09/14/2017  . Pleural effusion 07/22/2017  . Malignant neoplasm of hilus of right lung ([place]) 09/25/2016  . COPD exacerbation ([place]) 07/04/2016  . Iron deficiency anemia 03/27/2016  . Malabsorption of iron 03/27/2016  . Iron deficiency anemia due to chronic blood loss 03/27/2016  . Loss of weight 02/19/2016  . Type II diabetes mellitus with renal manifestations ([place]) 05/08/2015  . CAP (community acquired pneumonia) 05/07/2015  . Lactic acidosis 05/07/2015  . OSA on CPAP 01/03/2015  . Hypoxemia 12/31/2013  . Long term current use  of anticoagulant therapy 11/12/2012  . History of DVT of right axillary vein 11/07/2012  . Secondary diabetes with peripheral neuropathy ([place]) 11/07/2012  . Hypertension 11/07/2012  . Hyperlipidemia 11/07/2012  . BPH (benign prostatic hypertrophy) 11/07/2012  . Paroxysmal atrial fibrillation ([place]) 11/07/2012  . Diverticulosis   . Lung cancer ([place])   . Diabetic peripheral neuropathy ([place])   . COPD (chronic obstructive pulmonary disease) ([place])   . Allergic rhinitis     Past Surgical History:  Procedure Laterality Date  . CARDIAC CATHETERIZATION  2006  . deviated septum repair     . FIBEROPTIC BRONCHOSCOPY  12/11/2010  . Insertion of a left subclavian Port-A-Cath.  12/17/10   [place]  . PORTACATH PLACEMENT  04/23/2011   Procedure: INSERTION PORT-A-CATH;  Surgeon: Pierre Bali, MD;  Location: [facility];  Service: Thoracic;;  Revision of  Porta-Cath  . VIDEO BRONCHOSCOPY  09/27/2011   Procedure: VIDEO BRONCHOSCOPY;  Surgeon: Nicanor Alcon, MD;  Location: [facility] OR;  Service: Thoracic;  Laterality: N/A;        Home Medications    Prior to Admission medications   Medication Sig Start Date End Date Taking? Authorizing Provider  acetaminophen (TYLENOL) 325 MG tablet Take 650 mg by mouth every 4 (four) hours as needed for headache (chills/low grade fever).   Yes [provider]  albuterol (PROVENTIL) (2.5 MG/3ML) 0.083% nebulizer solution Take 3 mLs (2.5 mg total) by nebulization 3 (three) times daily. 3 times daily times 5 days then every 6 hours as needed. Patient taking differently: Take 2.5 mg by nebulization 3 (three) times daily.  09/16/17  Yes Eugenie Filler, MD  amiodarone (PACERONE) 200 MG tablet Take 200 mg by mouth See admin instructions. Take one tablet (200 mg) by mouth on Monday, Wednesday, Friday mornings, may also take one tablet (200 mg) as needed for palpitations   Yes [provider]  apixaban (ELIQUIS) 5 MG TABS tablet Take 1 tablet (5 mg total) by mouth 2 (two) times daily. 11/12/12  Yes Jacolyn Reedy, MD  atorvastatin (LIPITOR) 20 MG tablet Take 20 mg by mouth at bedtime.  03/05/17  Yes [provider]  cetirizine (ZYRTEC) 10 MG tablet Take 10 mg by mouth daily.    Yes [provider]  diltiazem (CARDIZEM CD) 120 MG 24 hr capsule Take 120 mg by mouth daily.   Yes [provider]  Fluticasone-Umeclidin-Vilant (TRELEGY ELLIPTA) 100-62.5-25 MCG/INH AEPB Inhale 1 puff into the lungs daily. 12/12/17  Yes Collene Gobble, MD  furosemide (LASIX) 40 MG tablet Take 80 mg by mouth daily.    Yes [provider]    glucosamine-chondroitin 500-400 MG tablet Take 1 tablet by mouth 2 (two) times daily with a meal.    Yes [provider]  Insulin Detemir (LEVEMIR FLEXTOUCH) 100 UNIT/ML Pen Inject 20-24 Units into the skin See admin instructions. Inject 24 units subcutaneously before breakfast, inject 20 units at bedtime, reported 10/29/17.   Yes [provider]  insulin lispro (HUMALOG) 100 UNIT/ML injection Inject 6-7 Units into the skin 3 (three) times daily as needed for high blood sugar (CBG >150 (150-250 6 units, >250 7 units)).    Yes [provider]  ipratropium-albuterol (DUONEB) 0.5-2.5 (3) MG/3ML SOLN Take 3 mLs by nebulization every 6 (six) hours as needed. Patient taking differently: Take 3 mLs by nebulization every 6 (six) hours as needed (sob, wheezing).  10/09/17  Yes Amin, Jeanella Flattery, MD  levothyroxine (SYNTHROID, LEVOTHROID) 25 MCG tablet  Take 25 mcg by mouth daily before breakfast.   Yes [provider]  metFORMIN (GLUCOPHAGE-XR) 500 MG 24 hr tablet Take 500 mg by mouth 2 (two) times daily after a meal.  07/25/16  Yes [provider]  oxybutynin (DITROPAN) 5 MG tablet Take 5 mg by mouth at bedtime.    Yes [provider]  Polyvinyl Alcohol-Povidone (REFRESH OP) Place 1 drop into both eyes at bedtime.    Yes [provider]  predniSONE (DELTASONE) 10 MG tablet Take 5mg  (1/2 tablet) until next office visit. 05/06/18  Yes Byrum, Rose Fillers, MD  PROAIR HFA [phone] Base) MCG/ACT inhaler INHALE TWO PUFFS BY MOUTH EVERY 6 HOURS AS NEEDED FOR WHEEZING AND FOR SHORTNESS OF BREATH Patient taking differently: Inhale 2 puffs into the lungs every 6 (six) hours as needed for wheezing or shortness of breath.  04/10/17  Yes Byrum, Rose Fillers, MD  temazepam (RESTORIL) 15 MG capsule TAKE ONE CAPSULE BY MOUTH AT BEDTIME AS NEEDED FOR SLEEP Patient taking differently: Take 15 mg by mouth at bedtime.  02/02/14  Yes Volanda Napoleon, MD  glucose blood (ONETOUCH  VERIO) test strip  09/23/17   [provider]    Family History Family History  Problem Relation Age of Onset  . Coronary artery disease Unknown   . Cancer Unknown   . Multiple sclerosis Unknown   . Diabetes type II Unknown   . Anesthesia problems Neg Hx   . Hypotension Neg Hx   . Malignant hyperthermia Neg Hx   . Pseudochol deficiency Neg Hx     Social History Social History   Tobacco Use  . Smoking status: Former Smoker    Packs/day: 1.00    Years: 50.00    Pack years: 50.00    Types: Cigarettes    Start date: 05/18/1943    Last attempt to quit: 06/10/1992    Years since quitting: 25.9  . Smokeless tobacco: Never Used  . Tobacco comment: quit tobacco 3 years ago  Substance Use Topics  . Alcohol use: No    Alcohol/week: 0.0 standard drinks  . Drug use: No     Allergies   Patient has no known allergies.   Review of Systems Review of Systems  Constitutional: Positive for activity change, chills and fatigue. Negative for fever.  HENT: Negative for ear pain and sore throat.   Eyes: Negative for pain and visual disturbance.  Respiratory: Positive for cough and shortness of breath.   Cardiovascular: Negative for chest pain, palpitations and leg swelling.  Gastrointestinal: Negative for abdominal pain and vomiting.  Endocrine: Positive for cold intolerance.  Genitourinary: Negative for dysuria and hematuria.  Musculoskeletal: Negative for arthralgias and back pain.  Skin: Positive for pallor. Negative for color change and rash.  Neurological: Negative for seizures and syncope.  All other systems reviewed and are negative.    Physical Exam Updated Vital Signs BP 93/62   Pulse 70   Temp 100.2 F (37.9 C) (Rectal)   Resp 20   Ht 5\' 10"  (1.778 m)   Wt 96.6 kg   SpO2 93%   BMI 30.56 kg/m   Physical Exam  Constitutional: He appears well-developed and well-nourished.  HENT:  Head: Normocephalic and atraumatic.  Eyes: Conjunctivae are normal.  Neck:  Neck supple.  Cardiovascular: Normal rate and regular rhythm.  No murmur heard. Pulmonary/Chest: Accessory muscle usage present. Tachypnea noted. No respiratory distress. He has rhonchi. He has rales.  Abdominal: Soft. He exhibits no distension.  There is no tenderness.  Musculoskeletal: He exhibits no edema.  Neurological: He is alert.  Skin: Skin is warm and dry.  Psychiatric: He has a normal mood and affect.  Nursing note and vitals reviewed.    ED Treatments / Results  Labs (all labs ordered are listed, but only abnormal results are displayed) Labs Reviewed  BASIC METABOLIC PANEL - Abnormal; Notable for the following components:      Result Value   Sodium 133 (*)    Chloride 93 (*)    Glucose, Bld 385 (*)    BUN 28 (*)    Creatinine, Ser 1.68 (*)    Calcium 8.6 (*)    GFR calc non Af Amer 35 (*)    GFR calc Af Amer 41 (*)    Anion gap 17 (*)    All other components within normal limits  CBC WITH DIFFERENTIAL/PLATELET - Abnormal; Notable for the following components:   WBC 21.8 (*)    Neutro Abs 19.7 (*)    Abs Immature Granulocytes 0.13 (*)    All other components within normal limits  BRAIN NATRIURETIC PEPTIDE - Abnormal; Notable for the following components:   B Natriuretic Peptide 240.6 (*)    All other components within normal limits  URINALYSIS, ROUTINE W REFLEX MICROSCOPIC - Abnormal; Notable for the following components:   Glucose, UA >=500 (*)    All other components within normal limits  I-STAT CG4 LACTIC ACID, ED - Abnormal; Notable for the following components:   Lactic Acid, Venous 4.37 (*)    All other components within normal limits  I-STAT CG4 LACTIC ACID, ED - Abnormal; Notable for the following components:   Lactic Acid, Venous 2.44 (*)    All other components within normal limits  I-STAT ARTERIAL BLOOD GAS, ED - Abnormal; Notable for the following components:   pO2, Arterial 137.0 (*)    All other components within normal limits  CULTURE, BLOOD  (ROUTINE X 2)  CULTURE, BLOOD (ROUTINE X 2)  CULTURE, BLOOD (ROUTINE X 2)  CULTURE, BLOOD (ROUTINE X 2)  EXPECTORATED SPUTUM ASSESSMENT W REFEX TO RESP CULTURE  GRAM STAIN  BLOOD GAS, ARTERIAL  BASIC METABOLIC PANEL  CBC  STREP PNEUMONIAE URINARY ANTIGEN  I-STAT TROPONIN, ED    EKG None  Radiology Dg Chest Portable 1 View  Result Date: 05/17/2018 CLINICAL DATA:  Pts family states that pt has been having SOB and CP for 2 days. Pt is a previous smoker and had a mass in his lung that was treated w/o surgery. EXAM: PORTABLE CHEST 1 VIEW COMPARISON:  11/27/2017 FINDINGS: Cardiac silhouette is normal in size. No mediastinal or hilar masses. There are thickened bronchovascular and interstitial markings most evident in the lung bases, stable compared to the prior exam. No discrete lung mass. No convincing pneumonia and no evidence of pulmonary edema. No pleural effusion or pneumothorax. Left anterior chest wall Port-A-Cath is stable, tip in the lower superior vena cava. Skeletal structures are grossly intact. IMPRESSION: 1. No acute findings. 2. Chronic changes of COPD and interstitial lung disease. Electronically Signed   By: Lajean Manes M.D.   On: 05/17/2018 16:53    Procedures Procedures (including critical care time)  Medications Ordered in ED Medications  ipratropium (ATROVENT) nebulizer solution 0.5 mg (0.5 mg Nebulization Given 05/17/18 1759)  azithromycin (ZITHROMAX) 500 mg in sodium chloride 0.9 % 250 mL IVPB (0 mg Intravenous Stopped 05/17/18 1804)  ceFEPIme (MAXIPIME) 2 g in sodium chloride 0.9 % 100 mL IVPB (  has no administration in time range)  sodium chloride 0.9 % bolus 1,000 mL (0 mLs Intravenous Stopped 05/17/18 2005)    And  sodium chloride 0.9 % bolus 1,000 mL (0 mLs Intravenous Stopped 05/17/18 2005)    And  sodium chloride 0.9 % bolus 1,000 mL (has no administration in time range)  acetaminophen (TYLENOL) tablet 650 mg (has no administration in time range)  atorvastatin  (LIPITOR) tablet 20 mg (has no administration in time range)  amiodarone (PACERONE) tablet 200 mg (has no administration in time range)  diltiazem (CARDIZEM CD) 24 hr capsule 120 mg (has no administration in time range)  temazepam (RESTORIL) capsule 15 mg (has no administration in time range)  Insulin Detemir (LEVEMIR) FlexPen 20-24 Units (has no administration in time range)  albuterol (PROVENTIL) (2.5 MG/3ML) 0.083% nebulizer solution 2.5 mg (2.5 mg Nebulization Given 05/17/18 2059)  apixaban (ELIQUIS) tablet 5 mg (has no administration in time range)  oxybutynin (DITROPAN) tablet 5 mg (has no administration in time range)  levothyroxine (SYNTHROID, LEVOTHROID) tablet 25 mcg (has no administration in time range)  methylPREDNISolone sodium succinate (SOLU-MEDROL) 125 mg/2 mL injection 60 mg (has no administration in time range)  sodium chloride flush (NS) 0.9 % injection 3 mL (has no administration in time range)  sodium chloride flush (NS) 0.9 % injection 3 mL (has no administration in time range)  0.9 %  sodium chloride infusion (has no administration in time range)  0.9 %  sodium chloride infusion (has no administration in time range)  acetaminophen (TYLENOL) tablet 650 mg (has no administration in time range)    Or  acetaminophen (TYLENOL) suppository 650 mg (has no administration in time range)  HYDROcodone-acetaminophen (NORCO/VICODIN) 5-325 MG per tablet 1-2 tablet (has no administration in time range)  ondansetron (ZOFRAN) tablet 4 mg (has no administration in time range)    Or  ondansetron (ZOFRAN) injection 4 mg (has no administration in time range)  albuterol (PROVENTIL) (2.5 MG/3ML) 0.083% nebulizer solution 2.5 mg (has no administration in time range)  insulin aspart (novoLOG) injection 0-9 Units (has no administration in time range)  insulin aspart (novoLOG) injection 0-5 Units (has no administration in time range)  azithromycin (ZITHROMAX) 500 mg in sodium chloride 0.9 % 250 mL  IVPB (has no administration in time range)  guaiFENesin (MUCINEX) 12 hr tablet 600 mg (has no administration in time range)  albuterol (PROVENTIL,VENTOLIN) solution continuous neb (5 mg/hr Nebulization Given 05/17/18 1758)  ceFEPIme (MAXIPIME) 2 g in sodium chloride 0.9 % 100 mL IVPB (0 g Intravenous Stopped 05/17/18 1737)  methylPREDNISolone sodium succinate (SOLU-MEDROL) 125 mg/2 mL injection 125 mg (125 mg Intravenous Given 05/17/18 1700)     Initial Impression / Assessment and Plan / ED Course  I have reviewed the triage vital signs and the nursing notes.  Pertinent labs & imaging results that were available during my care of the patient were reviewed by me and considered in my medical decision making (see chart for details).  Clinical Course as of May 17 2233  Nancy Fetter May 17, 2018  1730 Sepsis - Repeat Assessment  Performed at:    Leonia Reader, MD   [SW]    Clinical Course User Index [SW] Erskine Squibb, MD   Patient is a 84 year old male with past medical history significant for COPD, on 6 L nasal cannula, lung cancer, CHF presenting for worsening shortness of breath and inability to ambulate.  Upon arrival patient is hypoxic as he has turned down his oxygen for  the ride.  Patient was placed on his 6 L baseline supplemental oxygen, oxygen saturation 92%.  Patient is tachypneic and physical exam shows rales and rhonchi throughout.  Rectal temperature of 100.2 Fahrenheit. Due to patient's significant past medical history and elevated temperature, code sepsis was initiated.  Patient was placed on BiPAP for tachypnea and increased work of breathing.  1 hour of continuous albuterol with Atrovent will be given.  Chest x-ray shows no focal consolidations, no signs of obvious pneumonia. Labs remarkable for elevated leukocytosis, mild elevation in creatinine.  Patient will be given cefepime and azithromycin for pneumonia coverage as he has a history of COPD exacerbation and structural lung  disease. Work of breathing and respiratory status was discussed with patient with family at bedside.  Patient would like to be intubated if necessary for treatment. Patient was reassessed on BiPAP, tolerating well and states that he is feeling much better.  Patient will be given breathing treatments and admitted for COPD exacerbation. ABG shows normal pH, PCO2. Elevated lactic acid - therefore 30cc/kg bolus was started. Patient will be admitted to hospitalist for further observation and management at step down unit.   Final Clinical Impressions(s) / ED Diagnoses   Final diagnoses:  COPD exacerbation ([place])  Hypoxia  Congestive heart failure, unspecified HF chronicity, unspecified heart failure type ([place])  SOB (shortness of breath)    ED Discharge Orders    None       Erskine Squibb, MD 05/17/18 1759    Erskine Squibb, MD 05/17/18 5883    Noemi Chapel, MD 05/19/18 [id number]

## 2018-05-17 NOTE — Progress Notes (Signed)
RT obtained ABG from pt on BIPAP 12/6, FIO2 60. Pt results were as follows. RT will continue to monitor.     Results for [name], [name] (MRN [id number]) as of 05/17/2018 17:47  Ref. Range 05/17/2018 17:42  Sample type Unknown ARTERIAL  pH, Arterial Latest Ref Range: 7.350 - 7.450  7.410  pCO2 arterial Latest Ref Range: 32.0 - 48.0 mmHg 43.3  pO2, Arterial Latest Ref Range: 83.0 - 108.0 mmHg 137.0 (H)  TCO2 Latest Ref Range: 22 - 32 mmol/L 28  Acid-Base Excess Latest Ref Range: 0.0 - 2.0 mmol/L 2.0  Bicarbonate Latest Ref Range: 20.0 - 28.0 mmol/L 27.2  O2 Saturation Latest Units: % 99.0  Patient temperature Unknown 100.2 F  Collection site Unknown RADIAL, ALLEN'S TEST ACCEPTABLE

## 2018-05-17 NOTE — ED Notes (Signed)
Pt arrived via POV with resp distress.  O2 sats on arrival 80% on 4 liters [place].  States he normally wears 6 liters but turned O2 down due to ride from [place].  Oxygen increased back to 6 liters. Sats up to 87-88%.  Pt taken straight to treatment room and Dr. Sabra Heck notified.

## 2018-05-17 NOTE — ED Notes (Signed)
Pt continues to say that breathing is improving.

## 2018-05-18 ENCOUNTER — Other Ambulatory Visit: Payer: Self-pay

## 2018-05-18 DIAGNOSIS — J9621 Acute and chronic respiratory failure with hypoxia: Secondary | ICD-10-CM

## 2018-05-18 LAB — GLUCOSE, CAPILLARY
Glucose-Capillary: 240 mg/dL — ABNORMAL HIGH (ref 70–99)
Glucose-Capillary: 438 mg/dL — ABNORMAL HIGH (ref 70–99)
Glucose-Capillary: 262 mg/dL — ABNORMAL HIGH (ref 70–99)
Glucose-Capillary: 248 mg/dL — ABNORMAL HIGH (ref 70–99)

## 2018-05-18 LAB — BASIC METABOLIC PANEL
Anion gap: 13 (ref 5–15)
BUN: 22 mg/dL (ref 8–23)
CHLORIDE: 97 mmol/L — AB (ref 98–111)
CO2: 25 mmol/L (ref 22–32)
Calcium: 8.2 mg/dL — ABNORMAL LOW (ref 8.9–10.3)
Creatinine, Ser: 1.43 mg/dL — ABNORMAL HIGH (ref 0.61–1.24)
GFR calc Af Amer: 49 mL/min — ABNORMAL LOW (ref >60)
GFR calc non Af Amer: 43 mL/min — ABNORMAL LOW (ref >60)
GLUCOSE: 296 mg/dL — AB (ref 70–99)
Potassium: 3.4 mmol/L — ABNORMAL LOW (ref 3.5–5.1)
Sodium: 135 mmol/L (ref 135–145)

## 2018-05-18 LAB — EXPECTORATED SPUTUM ASSESSMENT W GRAM STAIN, RFLX TO RESP C

## 2018-05-18 LAB — STREP PNEUMONIAE URINARY ANTIGEN: Strep Pneumo Urinary Antigen: NEGATIVE

## 2018-05-18 LAB — CBC
HEMATOCRIT: 40.6 % (ref 39.0–52.0)
Hemoglobin: 12.5 g/dL — ABNORMAL LOW (ref 13.0–17.0)
MCH: 29.3 pg (ref 26.0–34.0)
MCHC: 30.8 g/dL (ref 30.0–36.0)
MCV: 95.3 fL (ref 80.0–100.0)
Platelets: 204 10*3/uL (ref 150–400)
RBC: 4.26 MIL/uL (ref 4.22–5.81)
RDW: 14.3 % (ref 11.5–15.5)
WBC: 16 10*3/uL — ABNORMAL HIGH (ref 4.0–10.5)
nRBC: 0 % (ref 0.0–0.2)

## 2018-05-18 LAB — MRSA PCR SCREENING: MRSA by PCR: NEGATIVE

## 2018-05-18 MED ORDER — PANTOPRAZOLE SODIUM 40 MG PO TBEC
40.0000 mg | DELAYED_RELEASE_TABLET | Freq: Every day | ORAL | Status: DC
  Administered 2018-05-18 – 2018-05-20 (×3): 40 mg via ORAL
  Filled 2018-05-18 (×3): qty 1

## 2018-05-18 MED ORDER — INSULIN DETEMIR 100 UNIT/ML ~~LOC~~ SOLN
30.0000 [IU] | Freq: Every day | SUBCUTANEOUS | Status: DC
  Administered 2018-05-19 – 2018-05-20 (×2): 30 [IU] via SUBCUTANEOUS
  Filled 2018-05-18 (×2): qty 0.3

## 2018-05-18 MED ORDER — METHYLPREDNISOLONE SODIUM SUCC 40 MG IJ SOLR
40.0000 mg | Freq: Three times a day (TID) | INTRAMUSCULAR | Status: DC
  Administered 2018-05-18 – 2018-05-19 (×3): 40 mg via INTRAVENOUS
  Filled 2018-05-18 (×3): qty 1

## 2018-05-18 MED ORDER — HYPROMELLOSE (GONIOSCOPIC) 2.5 % OP SOLN
1.0000 [drp] | OPHTHALMIC | Status: DC | PRN
  Filled 2018-05-18: qty 15

## 2018-05-18 MED ORDER — INSULIN DETEMIR 100 UNIT/ML ~~LOC~~ SOLN
25.0000 [IU] | Freq: Every day | SUBCUTANEOUS | Status: DC
  Administered 2018-05-18 – 2018-05-19 (×2): 25 [IU] via SUBCUTANEOUS
  Filled 2018-05-18 (×2): qty 0.25

## 2018-05-18 MED ORDER — PSYLLIUM 95 % PO PACK
1.0000 | PACK | Freq: Every day | ORAL | Status: DC
  Administered 2018-05-18: 1 via ORAL
  Filled 2018-05-18 (×3): qty 1

## 2018-05-18 MED ORDER — POLYVINYL ALCOHOL 1.4 % OP SOLN
1.0000 [drp] | OPHTHALMIC | Status: DC | PRN
  Filled 2018-05-18: qty 15

## 2018-05-18 NOTE — ED Notes (Signed)
RT in room, pt placed back on Bipap d/t sleeping and decrease in 02 sats.  Bridge of nose padded for comfort.

## 2018-05-18 NOTE — Progress Notes (Signed)
Patient arrived from ED on BIPAP. Report called to this RT. Patient off BIPAP and placed on 6L [place]. No distress noted.

## 2018-05-18 NOTE — Progress Notes (Signed)
PROGRESS NOTE                                                                                                                                                                                                             Patient Demographics:    [name], is a 84 y.o. male, DOB - 02-23-1925, EEF:[id number]  Admit date - 05/17/2018   Admitting Physician Merton Border, MD  Outpatient Primary MD for the patient is Avva, Steva Ready, MD  LOS - 1   Chief Complaint  Patient presents with  . Shortness of Breath       Brief Narrative    84 y.o. 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    Resents with increased work of breathing, work-up significant for acute on chronic hypoxic respiratory failure, and COPD exacerbation.  Subjective:    [name] today has, No headache, No chest pain, No abdominal pain -reports some dyspnea, but significantly improved since yesterday, but not back at baseline, reports nonproductive cough .   Assessment  & Plan :    Active Problems:   Respiratory failure (HCC)    Acute on chronic hypoxic respiratory failure  -With baseline COPD, on 6 L nasal cannula, who presents with increased work of breathing, requiring BiPAP , had couple episodes of hypoxia despite being on BiPAP . -Significant improvement this morning, weaned off BiPAP this morning . -Wheezing has improved, symptoms has improved as well, I will taper his steroids to 40 mg IV every 8 hours . -Follow on sputum cultures, continue with IV cefepime and azithromycin today . -DC IV fluids to avoid volume overload   COPD exacerbation -Tapering IV steroids  Paroxysmal A. fib with controlled  rate Continue with Eliquis and rate control with amiodarone  SIRS -presents  with fever, leukocytosis, tachypnea, evaded lactic acid, and empirically with azithromycin and cefepime, no source of infection yet.  Tonic diastolic congestive heart failure and on 10/2017 with ejection fraction 55 to 88% and mild diastolic dysfunction,  -Continue to hold Lasix, but I will DC IV fluids today .  AKI and  CKD stage III -Baseline creatinine 1.1, was 1.68 on admission, did improve to 1.43 today with IV fluids, continue to hold diuresis  Diabetes mellitus type II  - Hold metformin due to renal insufficiency continue with insulin sliding scale, CBGs remains significantly uncontrolled, likely due to steroids, recent evening Lantus dose from 20-25, morning dose from 24-30  Hyperlipidemia; continue with Lipitor   Code Status : Full  Family Communication  : daughter at bedside  Disposition Plan  : home when stable  Consults  :  none  Procedures  : none  DVT Prophylaxis  :  On Eliquis  Lab Results  Component Value Date   PLT 204 05/18/2018    Antibiotics  :    Anti-infectives (From admission, onward)   Start     Dose/Rate Route Frequency Ordered Stop   05/18/18 1700  ceFEPIme (MAXIPIME) 2 g in sodium chloride 0.9 % 100 mL IVPB     2 g 200 mL/hr over 30 Minutes Intravenous Every 24 hours 05/17/18 1643     05/17/18 2100  azithromycin (ZITHROMAX) 500 mg in sodium chloride 0.9 % 250 mL IVPB  Status:  Discontinued     500 mg 250 mL/hr over 60 Minutes Intravenous Every 24 hours 05/17/18 2048 05/18/18 0526   05/17/18 1615  ceFEPIme (MAXIPIME) 2 g in sodium chloride 0.9 % 100 mL IVPB     2 g 200 mL/hr over 30 Minutes Intravenous  Once 05/17/18 1601 05/17/18 1737   05/17/18 1615  azithromycin (ZITHROMAX) 500 mg in sodium chloride 0.9 % 250 mL IVPB     500 mg 250 mL/hr over 60 Minutes Intravenous Every 24 hours 05/17/18 1601          Objective:   Vitals:   05/18/18 0430 05/18/18 0526  05/18/18 0823 05/18/18 1123  BP: 98/63 109/68 103/65 96/63  Pulse: 62 68 76 74  Resp: 16 16 18 19   Temp:  (!) 97.3 F (36.3 C) (!) 97.5 F (36.4 C) 98.7 F (37.1 C)  TempSrc:  Axillary Oral Oral  SpO2: 95% 98% 90% 96%  Weight:      Height:        Wt Readings from Last 3 Encounters:  05/17/18 96.6 kg  04/06/18 104.3 kg  04/06/18 104.3 kg     Intake/Output Summary (Last 24 hours) at 05/18/2018 1216 Last data filed at 05/18/2018 0831 Gross per 24 hour  Intake 899.09 ml  Output -  Net 899.09 ml     Physical Exam  Awake Alert, Oriented X 3, sitting at the edge of the bed Symmetrical Chest wall movement, diminished air entry, has significant wheezing bilaterally RRR,No Gallops,Rubs or new Murmurs, No Parasternal Heave +ve B.Sounds, Abd Soft, No tenderness, No rebound - guarding or rigidity. No Cyanosis, Clubbing or edema, No new Rash or bruise      Data Review:    CBC Recent Labs  Lab 05/17/18 1613 05/18/18 0324  WBC 21.8* 16.0*  HGB 14.2 12.5*  HCT 44.1 40.6  PLT 208 204  MCV 93.0 95.3  MCH 30.0 29.3  MCHC 32.2 30.8  RDW 14.2 14.3  LYMPHSABS 1.0  --   MONOABS 1.0  --   EOSABS 0.0  --   BASOSABS 0.0  --     Chemistries  Recent Labs  Lab 05/17/18 1613 05/18/18 0324  NA 133* 135  K 3.6 3.4*  CL 93* 97*  CO2 23 25  GLUCOSE 385* 296*  BUN 28* 22  CREATININE 1.68* 1.43*  CALCIUM 8.6* 8.2*   ------------------------------------------------------------------------------------------------------------------ No results for input(s): CHOL, HDL, LDLCALC, TRIG, CHOLHDL, LDLDIRECT in the last 72 hours.  Lab Results  Component Value Date   HGBA1C 8.2 (H) 09/16/2017   ------------------------------------------------------------------------------------------------------------------ No results for input(s): TSH, T4TOTAL, T3FREE, THYROIDAB in the last 72 hours.  Invalid input(s):  FREET3 ------------------------------------------------------------------------------------------------------------------ No results for input(s): VITAMINB12, FOLATE, FERRITIN, TIBC, IRON, RETICCTPCT in the last 72 hours.  Coagulation profile No results for input(s): INR, PROTIME in the last 168 hours.  No results for input(s): DDIMER in the last 72 hours.  Cardiac Enzymes No results for input(s): CKMB, TROPONINI, MYOGLOBIN in the last 168 hours.  Invalid input(s): CK ------------------------------------------------------------------------------------------------------------------    Component Value Date/Time   BNP 240.6 (H) 05/17/2018 1613    Inpatient Medications  Scheduled Meds: . apixaban  5 mg Oral BID  . atorvastatin  20 mg Oral QHS  . diltiazem  120 mg Oral Daily  . guaiFENesin  600 mg Oral BID  . insulin aspart  0-5 Units Subcutaneous QHS  . insulin aspart  0-9 Units Subcutaneous TID WC  . insulin detemir  20 Units Subcutaneous QHS  . insulin detemir  24 Units Subcutaneous Daily  . levothyroxine  25 mcg Oral QAC breakfast  . methylPREDNISolone (SOLU-MEDROL) injection  40 mg Intravenous Q8H  . oxybutynin  5 mg Oral QHS  . sodium chloride flush  3 mL Intravenous Q12H  . temazepam  15 mg Oral QHS   Continuous Infusions: . sodium chloride    . azithromycin Stopped (05/17/18 1804)  . ceFEPime (MAXIPIME) IV    . sodium chloride     PRN Meds:.sodium chloride, acetaminophen **OR** acetaminophen, albuterol, HYDROcodone-acetaminophen, ondansetron **OR** ondansetron (ZOFRAN) IV, sodium chloride flush  Micro Results Recent Results (from the past 240 hour(s))  Culture, blood (routine x 2)     Status: None (Preliminary result)   Collection Time: 05/17/18  4:13 PM  Result Value Ref Range Status   Specimen Description BLOOD BLOOD RIGHT FOREARM  Final   Special Requests   Final    Blood Culture adequate volume BOTTLES DRAWN AEROBIC AND ANAEROBIC   Culture PENDING   Incomplete   Report Status PENDING  Incomplete  MRSA PCR Screening     Status: None   Collection Time: 05/18/18  5:29 AM  Result Value Ref Range Status   MRSA by PCR NEGATIVE NEGATIVE Final    Comment:        The GeneXpert MRSA Assay (FDA approved for NASAL specimens only), is one component of a comprehensive MRSA colonization surveillance program. It is not intended to diagnose MRSA infection nor to guide or monitor treatment for MRSA infections. Performed at [facility], [place] [address], [place], [place] [address]     Radiology Reports Dg Chest Portable 1 View  Result Date: 05/17/2018 CLINICAL DATA:  Pts family states that pt has been having SOB and CP for 2 days. Pt is a previous smoker and had a mass in his lung that was treated w/o surgery. EXAM: PORTABLE CHEST 1 VIEW COMPARISON:  11/27/2017 FINDINGS: Cardiac silhouette is normal in size. No mediastinal or hilar masses. There are thickened bronchovascular and interstitial markings most evident in the lung bases, stable compared to the prior exam. No discrete lung mass. No convincing pneumonia and no evidence of pulmonary edema. No pleural effusion or pneumothorax. Left anterior chest wall Port-A-Cath is stable, tip in the lower superior vena cava. Skeletal structures are grossly intact. IMPRESSION: 1. No acute findings.  2. Chronic changes of COPD and interstitial lung disease. Electronically Signed   By: Lajean Manes M.D.   On: 05/17/2018 16:53      Phillips Climes M.D on 05/18/2018 at 12:16 PM  Between 7am to 7pm - Pager - [phone]  After 7pm go to [email or web address] - password [facility]  [facility] -  Office  [phone]

## 2018-05-18 NOTE — Progress Notes (Signed)
Placed patient on CPAP via FFM, auto titrate settings (max 20, min 4).  BIPAP removed from room. No distress noted.

## 2018-05-18 NOTE — Plan of Care (Signed)

## 2018-05-18 NOTE — Evaluation (Signed)
Physical Therapy Evaluation Patient Details Name: [name] MRN: [id number] DOB: 1924-09-24 Today's Date: 05/18/2018   History of Present Illness  84 y.o. 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    Resents with increased work of breathing, work-up significant for acute on chronic hypoxic respiratory failure, and COPD exacerbation.  Clinical Impression  Pt admitted with above diagnosis. Pt currently with functional limitations due to the deficits listed below (see PT Problem List). Pt was able to ambulate with RW with good stability overall. Did not use RW at home therefore is weaker.  Pt wants to try rollator therefore will bring it next visit.  Pt did desat to 84% at end of walk on 6L but recovered quickly to >90% within 2 min.  Daughter states that is common and is baseline.  Will follow acutely.   Pt will benefit from skilled PT to increase their independence and safety with mobility to allow discharge to the venue listed below.      Follow Up Recommendations No PT follow up    Equipment Recommendations  None recommended by PT    Recommendations for Other Services       Precautions / Restrictions Precautions Precautions: Fall Restrictions Weight Bearing Restrictions: No      Mobility  Bed Mobility Overal bed mobility: Independent                Transfers Overall transfer level: Independent                  Ambulation/Gait Ambulation/Gait assistance: Min guard Gait Distance (Feet): 180 Feet Assistive device: Rolling walker (2 wheeled) Gait Pattern/deviations: Step-through pattern;Decreased stride length   Gait velocity interpretation: <1.8 ft/sec, indicate of risk for recurrent  falls General Gait Details: Pt able to walk with RW well wtih overall good safety.  Pt unsteady without UE support and usually does not use equipment. Discussed that rollator may benefit pt as he could then rest and will bring to try next visit.   Stairs            Wheelchair Mobility    Modified Rankin (Stroke Patients Only)       Balance Overall balance assessment: Needs assistance Sitting-balance support: No upper extremity supported;Feet supported Sitting balance-Leahy Scale: Fair     Standing balance support: Bilateral upper extremity supported;During functional activity Standing balance-Leahy Scale: Poor Standing balance comment: relies on UE support                             Pertinent Vitals/Pain Pain Assessment: No/denies pain    Home Living Family/patient expects to be discharged to:: Private residence Living Arrangements: Alone Available Help at Discharge: Family;Available PRN/intermittently Type of Home: House Home Access: Stairs to enter Entrance Stairs-Rails: None Entrance Stairs-Number of Steps: 2 Home Layout: Two level;Full bath on main level;Bed/bath upstairs;Able to live on main level with bedroom/bathroom Home Equipment: Kasandra Knudsen - single point;Shower seat;Grab bars - tub/shower;Grab bars - toilet;Hand held shower head;Walker - 2 wheels;Adaptive equipment;Toilet riser(pulse oximeter, lift chair) Additional Comments: has home O2, continuous 6 L and aware of other ADL A/E     Prior Function Level of Independence: Independent with assistive device(s)  Comments: pt still drives and cooks all his own meals     Hand Dominance   Dominant Hand: Right    Extremity/Trunk Assessment   Upper Extremity Assessment Upper Extremity Assessment: Defer to OT evaluation    Lower Extremity Assessment Lower Extremity Assessment: Generalized weakness    Cervical / Trunk Assessment Cervical / Trunk Assessment: Normal  Communication    Communication: No difficulties  Cognition Arousal/Alertness: Awake/alert Behavior During Therapy: WFL for tasks assessed/performed Overall Cognitive Status: Within Functional Limits for tasks assessed                                        General Comments      Exercises     Assessment/Plan    PT Assessment Patient needs continued PT services  PT Problem List Decreased activity tolerance;Decreased balance;Decreased mobility;Decreased knowledge of use of DME;Decreased safety awareness;Decreased knowledge of precautions;Cardiopulmonary status limiting activity       PT Treatment Interventions DME instruction;Gait training;Stair training;Functional mobility training;Therapeutic activities;Therapeutic exercise;Balance training;Patient/family education    PT Goals (Current goals can be found in the Care Plan section)  Acute Rehab PT Goals Patient Stated Goal: to gohome PT Goal Formulation: With patient Time For Goal Achievement: 06/01/18 Potential to Achieve Goals: Good    Frequency Min 3X/week   Barriers to discharge        Co-evaluation               AM-PAC PT "6 Clicks" Mobility  Outcome Measure Help needed turning from your back to your side while in a flat bed without using bedrails?: None Help needed moving from lying on your back to sitting on the side of a flat bed without using bedrails?: None Help needed moving to and from a bed to a chair (including a wheelchair)?: None Help needed standing up from a chair using your arms (e.g., wheelchair or bedside chair)?: A Little Help needed to walk in hospital room?: A Little Help needed climbing 3-5 steps with a railing? : A Little 6 Click Score: 21    End of Session Equipment Utilized During Treatment: Gait belt;Oxygen(6LO2) Activity Tolerance: Patient tolerated treatment well;Patient limited by fatigue Patient left: with call bell/phone within reach;in bed;with family/visitor present Nurse  Communication: Mobility status PT Visit Diagnosis: Muscle weakness (generalized) (M62.81);Difficulty in walking, not elsewhere classified (R26.2)    Time: 8242-3536 PT Time Calculation (min) (ACUTE ONLY): 21 min   Charges:   PT Evaluation $PT Eval Moderate Complexity: Colon Pager:  [phone]  Office:  [phone]    Denice Paradise 05/18/2018, 4:28 PM

## 2018-05-18 NOTE — ED Notes (Signed)
RT called for transport

## 2018-05-18 NOTE — ED Notes (Signed)
Pt switched from Bipap to [place] 6L. RT called for reassessment. Audible rhonchi heard.

## 2018-05-19 DIAGNOSIS — R0602 Shortness of breath: Secondary | ICD-10-CM

## 2018-05-19 LAB — CBC
HCT: 41.4 % (ref 39.0–52.0)
HEMOGLOBIN: 13.1 g/dL (ref 13.0–17.0)
MCH: 29 pg (ref 26.0–34.0)
MCHC: 31.6 g/dL (ref 30.0–36.0)
MCV: 91.8 fL (ref 80.0–100.0)
Platelets: 270 10*3/uL (ref 150–400)
RBC: 4.51 MIL/uL (ref 4.22–5.81)
RDW: 13.9 % (ref 11.5–15.5)
WBC: 20.9 10*3/uL — ABNORMAL HIGH (ref 4.0–10.5)
nRBC: 0 % (ref 0.0–0.2)

## 2018-05-19 LAB — GLUCOSE, CAPILLARY
GLUCOSE-CAPILLARY: 184 mg/dL — AB (ref 70–99)
Glucose-Capillary: 244 mg/dL — ABNORMAL HIGH (ref 70–99)
Glucose-Capillary: 243 mg/dL — ABNORMAL HIGH (ref 70–99)
Glucose-Capillary: 260 mg/dL — ABNORMAL HIGH (ref 70–99)

## 2018-05-19 LAB — BASIC METABOLIC PANEL
Anion gap: 12 (ref 5–15)
BUN: 25 mg/dL — ABNORMAL HIGH (ref 8–23)
CHLORIDE: 99 mmol/L (ref 98–111)
CO2: 24 mmol/L (ref 22–32)
Calcium: 8.5 mg/dL — ABNORMAL LOW (ref 8.9–10.3)
Creatinine, Ser: 1.29 mg/dL — ABNORMAL HIGH (ref 0.61–1.24)
GFR calc Af Amer: 56 mL/min — ABNORMAL LOW (ref >60)
GFR calc non Af Amer: 48 mL/min — ABNORMAL LOW (ref >60)
Glucose, Bld: 259 mg/dL — ABNORMAL HIGH (ref 70–99)
POTASSIUM: 3.8 mmol/L (ref 3.5–5.1)
Sodium: 135 mmol/L (ref 135–145)

## 2018-05-19 MED ORDER — FUROSEMIDE 10 MG/ML IJ SOLN
60.0000 mg | Freq: Once | INTRAMUSCULAR | Status: AC
  Administered 2018-05-19: 60 mg via INTRAVENOUS
  Filled 2018-05-19: qty 6

## 2018-05-19 MED ORDER — SODIUM CHLORIDE 0.9 % IV SOLN
1.0000 g | INTRAVENOUS | Status: DC
  Filled 2018-05-19: qty 10

## 2018-05-19 MED ORDER — METHYLPREDNISOLONE SODIUM SUCC 40 MG IJ SOLR
40.0000 mg | Freq: Two times a day (BID) | INTRAMUSCULAR | Status: DC
  Administered 2018-05-19 – 2018-05-20 (×2): 40 mg via INTRAVENOUS
  Filled 2018-05-19 (×3): qty 1

## 2018-05-19 MED ORDER — FUROSEMIDE 80 MG PO TABS
80.0000 mg | ORAL_TABLET | Freq: Every day | ORAL | Status: DC
  Administered 2018-05-20: 80 mg via ORAL
  Filled 2018-05-19: qty 1

## 2018-05-19 NOTE — Progress Notes (Signed)
Inpatient Diabetes Program Recommendations  AACE/ADA: New Consensus Statement on Inpatient Glycemic Control (2019)  Target Ranges:  Prepandial:   less than 140 mg/dL      Peak postprandial:   less than 180 mg/dL (1-2 hours)      Critically ill patients:  140 - 180 mg/dL   Results for [name], [name] (MRN [id number]) as of 05/19/2018 15:13  Ref. Range 05/18/2018 07:38 05/18/2018 11:20 05/18/2018 16:58 05/18/2018 21:08 05/19/2018 07:22 05/19/2018 11:29  Glucose-Capillary Latest Ref Range: 70 - 99 mg/dL 240 (H) 438 (H) 262 (H) 248 (H) 184 (H) 244 (H)   Review of Glycemic Control  Diabetes history: DM2 Outpatient Diabetes medications: Levemir 24 units QAM, Levemir 20 units QHS, Humalog 6-7 units TID with meals Current orders for Inpatient glycemic control: Levemir 30 units QAM, Levemir 25 units QHS, Novolog 0-9 units TID with meals, Novolog 0-5 units QHS; Solumedrol 40 mg Q12H  Inpatient Diabetes Program Recommendations:  Insulin - Meal Coverage: If steroids are continued, please consider ordering Novolog 3 units TID with  meals for meal coverage if patient eats at least 50% of meals.  Thanks, Barnie Alderman, RN, MSN, CDE Diabetes Coordinator Inpatient Diabetes Program [phone] (Team Pager from 8am to 5pm)

## 2018-05-19 NOTE — Care Management Note (Signed)
Case Management Note  Patient Details  Name: [name] MRN: [id number] Date of Birth: [birth year]  Subjective/Objective:    Acute resp hypoxic resp failure, COPD exac, afib, CHF                Action/Plan: Spoke to pt and offered choice for HH/CMS placed on chart. Pt declined HHRN at this time. States his dtr is a retired NP and checks on him daily. She does his meds at home. Has a RW at home, Oxygen ([employer]), CPAP and neb machine. Contacted [facility] for Rollator for home to be delivered to room prior to dc.    Expected Discharge Date:                  Expected Discharge Plan:  [place]  In-House Referral:  NA  Discharge planning Services  CM Consult  Post Acute Care Choice:  Home Health Choice offered to:  Patient  DME Arranged:  Walker rolling with seat DME Agency:  [place] Arranged:  Patient Refused [place] Agency:  NA  Status of Service:  Completed, signed off  If discussed at [place] of Stay Meetings, dates discussed:    Additional Comments:  Erenest Rasher, RN 05/19/2018, 3:55 PM

## 2018-05-19 NOTE — Progress Notes (Addendum)
PROGRESS NOTE                                                                                                                                                                                                             Patient Demographics:    [name], is a 84 y.o. male, DOB - 1924/06/25, NAT:[id number]  Admit date - 05/17/2018   Admitting Physician Merton Border, MD  Outpatient Primary MD for the patient is Avva, Steva Ready, MD  LOS - 2   Chief Complaint  Patient presents with  . Shortness of Breath       Brief Narrative    84 y.o. 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    Resents with increased work of breathing, work-up significant for acute on chronic hypoxic respiratory failure, and COPD exacerbation.  Subjective:    [name] today has, No headache, No chest pain, No abdominal pain -reports dyspnea has improved, reports cough, nonproductive    Assessment  & Plan :    Active Problems:   Respiratory failure (HCC)    Acute on chronic hypoxic respiratory failure  -With baseline COPD, on 6 L nasal cannula, who presents more dyspnea, requiring BiPAP initially, back to baseline 6 L nasal cannula daytime yesterday, but overnight had increased work of breathing, requiring BiPAP, most likely he is developing volume overload from holding Lasix . -Appears comfortable today,   COPD exacerbation -Improving, less wheezing, will taper IV steroids further, hopefully can be transitioned to oral prednisone in 1 to 2 days -leuckytosis related to steroids  Paroxysmal A. fib with controlled rate Continue with Eliquis and rate control with amiodarone  SIRS -presents  with fever,  leukocytosis, tachypnea, evaded lactic acid, and empirically with azithromycin and cefepime, no source of infection yet.  Continue azithromycin and Rocephin for COPD  Chronic diastolic congestive heart failure and on 10/2017 with ejection fraction 55 to 42% and mild diastolic dysfunction,  -She had some IV fluids on admission, Lasix has been on hold, has some volume overload, will give 60 of IV Lasix today and resume home dose  Lasix 80 mg from tomorrow .Marland Kitchen  AKI and CKD stage III -Baseline creatinine 1.1, was 1.68 on admission, proving, it is 1.2 today, closely as resuming back on Lasix   Diabetes mellitus type II  - Hold metformin due to renal insufficiency continue with insulin sliding scale, CBGs remains significantly uncontrolled, likely due to steroids, recent evening Lantus dose from 20-25, morning dose from 24-30  Hyperlipidemia; continue with Lipitor   Code Status : Full  Family Communication  : daughter at bedside  Disposition Plan  : home when stable  Consults  :  none  Procedures  : none  DVT Prophylaxis  :  On Eliquis  Lab Results  Component Value Date   PLT 270 05/19/2018    Antibiotics  :    Anti-infectives (From admission, onward)   Start     Dose/Rate Route Frequency Ordered Stop   05/18/18 1700  ceFEPIme (MAXIPIME) 2 g in sodium chloride 0.9 % 100 mL IVPB     2 g 200 mL/hr over 30 Minutes Intravenous Every 24 hours 05/17/18 1643     05/17/18 2100  azithromycin (ZITHROMAX) 500 mg in sodium chloride 0.9 % 250 mL IVPB  Status:  Discontinued     500 mg 250 mL/hr over 60 Minutes Intravenous Every 24 hours 05/17/18 2048 05/18/18 0526   05/17/18 1615  ceFEPIme (MAXIPIME) 2 g in sodium chloride 0.9 % 100 mL IVPB     2 g 200 mL/hr over 30 Minutes Intravenous  Once 05/17/18 1601 05/17/18 1737   05/17/18 1615  azithromycin (ZITHROMAX) 500 mg in sodium chloride 0.9 % 250 mL IVPB     500 mg 250 mL/hr over 60 Minutes Intravenous Every 24 hours 05/17/18 1601            Objective:   Vitals:   05/18/18 2323 05/19/18 0433 05/19/18 0925 05/19/18 1133  BP:  109/69 114/79 103/66  Pulse: 64 (!) 56 68 (!) 57  Resp: 18  18 18   Temp:   (!) 97.5 F (36.4 C) 97.7 F (36.5 C)  TempSrc:   Oral Oral  SpO2: 98% 98% 95% 94%  Weight:  96 kg    Height:        Wt Readings from Last 3 Encounters:  05/19/18 96 kg  04/06/18 104.3 kg  04/06/18 104.3 kg     Intake/Output Summary (Last 24 hours) at 05/19/2018 1411 Last data filed at 05/19/2018 1239 Gross per 24 hour  Intake 946.14 ml  Output 450 ml  Net 496.14 ml     Physical Exam  Awake Alert, Oriented X 3, No new F.N deficits, Normal affect Symmetrical Chest wall movement, proved air entry today, less wheezing, but has bibasilar crackles RRR,No Gallops,Rubs or new Murmurs, No Parasternal Heave +ve B.Sounds, Abd Soft, No tenderness, No rebound - guarding or rigidity. No Cyanosis, Clubbing, developed trace pedal edema, No new Rash or bruise       Data Review:    CBC Recent Labs  Lab 05/17/18 1613 05/18/18 0324 05/19/18 1038  WBC 21.8* 16.0* 20.9*  HGB 14.2 12.5* 13.1  HCT 44.1 40.6 41.4  PLT 208 204 270  MCV 93.0 95.3 91.8  MCH 30.0 29.3 29.0  MCHC 32.2 30.8 31.6  RDW 14.2 14.3 13.9  LYMPHSABS 1.0  --   --   MONOABS 1.0  --   --   EOSABS 0.0  --   --   BASOSABS 0.0  --   --     Chemistries  Recent Labs  Lab 05/17/18 1613 05/18/18 0324 05/19/18 1038  NA 133* 135 135  K 3.6 3.4* 3.8  CL 93* 97* 99  CO2 23 25 24   GLUCOSE 385* 296* 259*  BUN 28* 22 25*  CREATININE 1.68* 1.43* 1.29*  CALCIUM 8.6* 8.2* 8.5*   ------------------------------------------------------------------------------------------------------------------ No results for input(s): CHOL, HDL, LDLCALC, TRIG, CHOLHDL, LDLDIRECT in the last 72 hours.  Lab Results  Component Value Date   HGBA1C 8.2 (H) 09/16/2017    ------------------------------------------------------------------------------------------------------------------ No results for input(s): TSH, T4TOTAL, T3FREE, THYROIDAB in the last 72 hours.  Invalid input(s): FREET3 ------------------------------------------------------------------------------------------------------------------ No results for input(s): VITAMINB12, FOLATE, FERRITIN, TIBC, IRON, RETICCTPCT in the last 72 hours.  Coagulation profile No results for input(s): INR, PROTIME in the last 168 hours.  No results for input(s): DDIMER in the last 72 hours.  Cardiac Enzymes No results for input(s): CKMB, TROPONINI, MYOGLOBIN in the last 168 hours.  Invalid input(s): CK ------------------------------------------------------------------------------------------------------------------    Component Value Date/Time   BNP 240.6 (H) 05/17/2018 1613    Inpatient Medications  Scheduled Meds: . apixaban  5 mg Oral BID  . atorvastatin  20 mg Oral QHS  . diltiazem  120 mg Oral Daily  . guaiFENesin  600 mg Oral BID  . insulin aspart  0-5 Units Subcutaneous QHS  . insulin aspart  0-9 Units Subcutaneous TID WC  . insulin detemir  25 Units Subcutaneous QHS  . insulin detemir  30 Units Subcutaneous Daily  . levothyroxine  25 mcg Oral QAC breakfast  . methylPREDNISolone (SOLU-MEDROL) injection  40 mg Intravenous Q8H  . oxybutynin  5 mg Oral QHS  . pantoprazole  40 mg Oral Daily  . psyllium  1 packet Oral QHS  . sodium chloride flush  3 mL Intravenous Q12H  . temazepam  15 mg Oral QHS   Continuous Infusions: . sodium chloride 250 mL (05/18/18 1556)  . azithromycin 500 mg (05/18/18 1557)  . ceFEPime (MAXIPIME) IV 2 g (05/18/18 1748)  . sodium chloride     PRN Meds:.sodium chloride, acetaminophen **OR** acetaminophen, albuterol, HYDROcodone-acetaminophen, ondansetron **OR** ondansetron (ZOFRAN) IV, polyvinyl alcohol, sodium chloride flush  Micro Results Recent Results (from  the past 240 hour(s))  Culture, blood (routine x 2)     Status: None (Preliminary result)   Collection Time: 05/17/18  3:57 PM  Result Value Ref Range Status   Specimen Description BLOOD PORTA CATH  Final   Special Requests   Final    BOTTLES DRAWN AEROBIC AND ANAEROBIC Blood Culture adequate volume   Culture   Final    NO GROWTH < 24 HOURS Performed at [facility] [address], [place], [place] [address]    Report Status PENDING  Incomplete  Culture, blood (routine x 2)     Status: None (Preliminary result)   Collection Time: 05/17/18  4:13 PM  Result Value Ref Range Status   Specimen Description BLOOD BLOOD RIGHT FOREARM  Final   Special Requests   Final    Blood Culture adequate volume BOTTLES DRAWN AEROBIC AND ANAEROBIC   Culture   Final    NO GROWTH < 24 HOURS Performed at [facility], [place] [address] [place], [place] [address]    Report Status PENDING  Incomplete  MRSA PCR Screening     Status: None   Collection Time: 05/18/18  5:29 AM  Result Value Ref Range Status   MRSA by PCR NEGATIVE NEGATIVE Final    Comment:  The GeneXpert MRSA Assay (FDA approved for NASAL specimens only), is one component of a comprehensive MRSA colonization surveillance program. It is not intended to diagnose MRSA infection nor to guide or monitor treatment for MRSA infections. Performed at [facility], [place] [address] [place], [place] [address]   Culture, sputum-assessment     Status: None   Collection Time: 05/18/18  3:30 PM  Result Value Ref Range Status   Specimen Description SPUTUM  Final   Special Requests NONE  Final   Sputum evaluation   Final    THIS SPECIMEN IS ACCEPTABLE FOR SPUTUM CULTURE Performed at [facility], [address], [place], [place] [address]    Report Status 05/18/2018 FINAL  Final  Culture, respiratory     Status: None (Preliminary result)   Collection Time: 05/18/18  3:30 PM  Result Value Ref Range Status   Specimen  Description SPUTUM  Final   Special Requests NONE Reflexed from [id number]  Final   Gram Stain   Final    MODERATE WBC PRESENT, PREDOMINANTLY PMN FEW GRAM POSITIVE COCCI FEW GRAM POSITIVE RODS    Culture   Final    NO GROWTH < 24 HOURS Performed at [facility], [place] [address], [place], [place] [address]    Report Status PENDING  Incomplete    Radiology Reports Dg Chest Portable 1 View  Result Date: 05/17/2018 CLINICAL DATA:  Pts family states that pt has been having SOB and CP for 2 days. Pt is a previous smoker and had a mass in his lung that was treated w/o surgery. EXAM: PORTABLE CHEST 1 VIEW COMPARISON:  11/27/2017 FINDINGS: Cardiac silhouette is normal in size. No mediastinal or hilar masses. There are thickened bronchovascular and interstitial markings most evident in the lung bases, stable compared to the prior exam. No discrete lung mass. No convincing pneumonia and no evidence of pulmonary edema. No pleural effusion or pneumothorax. Left anterior chest wall Port-A-Cath is stable, tip in the lower superior vena cava. Skeletal structures are grossly intact. IMPRESSION: 1. No acute findings. 2. Chronic changes of COPD and interstitial lung disease. Electronically Signed   By: Lajean Manes M.D.   On: 05/17/2018 16:53      Phillips Climes M.D on 05/19/2018 at 2:11 PM  Between 7am to 7pm - Pager - [phone]  After 7pm go to [email or web address] - password [facility]  [facility] -  Office  [phone]

## 2018-05-19 NOTE — Progress Notes (Signed)
Physical Therapy Treatment Patient Details Name: [name] MRN: [id number] DOB: May 08, 1925 Today's Date: 05/19/2018    History of Present Illness 84 y.o. 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    Resents with increased work of breathing, work-up significant for acute on chronic hypoxic respiratory failure, and COPD exacerbation.    PT Comments    Pt admitted with above diagnosis. Pt currently with functional limitations due to the deficits listed below (see PT Problem List).  Pt was able to ambulate with min guard assist with rollator in hallway and taught pt how to use appropriately locking brakees for transfers.  Pt demonstrates appropriate use and would like a rollator for home.  Pt was able to take seated rest breaks and did not desat below 89% on 6LO2.  Will continue acute PT.  Pt will benefit from skilled PT to increase their independence and safety with mobility to allow discharge to the venue listed below.     Follow Up Recommendations  No PT follow up     Equipment Recommendations  Other (comment)(rollator with oxygen carrier & with storage bag under seat)    Recommendations for Other Services       Precautions / Restrictions Precautions Precautions: Fall Restrictions Weight Bearing Restrictions: No    Mobility  Bed Mobility Overal bed mobility: Independent                Transfers Overall transfer level: Independent                  Ambulation/Gait Ambulation/Gait assistance: Min guard Gait Distance (Feet): 100 Feet(50 feet x2) Assistive device: Rolling walker (2 wheeled) Gait Pattern/deviations: Step-through pattern;Decreased stride length   Gait velocity interpretation: <1.8  ft/sec, indicate of risk for recurrent falls General Gait Details: Pt able to walk with rollator well with overall good safety.  Pt unsteady without UE support.  Taught pt how to use rollator locking brakes, sitting and resting.  Pt really likes rollator.  He would like one for home.     Stairs             Wheelchair Mobility    Modified Rankin (Stroke Patients Only)       Balance Overall balance assessment: Needs assistance Sitting-balance support: No upper extremity supported;Feet supported Sitting balance-Leahy Scale: Fair     Standing balance support: Bilateral upper extremity supported;During functional activity Standing balance-Leahy Scale: Poor Standing balance comment: relies on UE support                            Cognition Arousal/Alertness: Awake/alert Behavior During Therapy: WFL for tasks assessed/performed Overall Cognitive Status: Within Functional Limits for tasks assessed                                        Exercises      General Comments        Pertinent Vitals/Pain Pain Assessment: No/denies pain    Home Living                      Prior Function  PT Goals (current goals can now be found in the care plan section) Acute Rehab PT Goals Patient Stated Goal: to gohome Progress towards PT goals: Progressing toward goals    Frequency    Min 3X/week      PT Plan Current plan remains appropriate    Co-evaluation              AM-PAC PT "6 Clicks" Mobility   Outcome Measure  Help needed turning from your back to your side while in a flat bed without using bedrails?: None Help needed moving from lying on your back to sitting on the side of a flat bed without using bedrails?: None Help needed moving to and from a bed to a chair (including a wheelchair)?: None Help needed standing up from a chair using your arms (e.g., wheelchair or bedside chair)?: A Little Help needed to walk  in hospital room?: A Little Help needed climbing 3-5 steps with a railing? : A Little 6 Click Score: 21    End of Session Equipment Utilized During Treatment: Gait belt;Oxygen(6LO2) Activity Tolerance: Patient tolerated treatment well;Patient limited by fatigue Patient left: with call bell/phone within reach;in bed;with family/visitor present(sitting EOB) Nurse Communication: Mobility status PT Visit Diagnosis: Muscle weakness (generalized) (M62.81);Difficulty in walking, not elsewhere classified (R26.2)     Time: 1031-5945 PT Time Calculation (min) (ACUTE ONLY): 28 min  Charges:  $Gait Training: 8-22 mins $Self Care/Home Management: 8-22                     [place] Pager:  [phone]  Office:  [place] 05/19/2018, 10:17 AM

## 2018-05-20 DIAGNOSIS — E039 Hypothyroidism, unspecified: Secondary | ICD-10-CM

## 2018-05-20 DIAGNOSIS — I5032 Chronic diastolic (congestive) heart failure: Secondary | ICD-10-CM

## 2018-05-20 DIAGNOSIS — I480 Paroxysmal atrial fibrillation: Secondary | ICD-10-CM

## 2018-05-20 DIAGNOSIS — E1122 Type 2 diabetes mellitus with diabetic chronic kidney disease: Secondary | ICD-10-CM

## 2018-05-20 LAB — BASIC METABOLIC PANEL
Anion gap: 10 (ref 5–15)
BUN: 31 mg/dL — ABNORMAL HIGH (ref 8–23)
CO2: 27 mmol/L (ref 22–32)
Calcium: 8.2 mg/dL — ABNORMAL LOW (ref 8.9–10.3)
Chloride: 101 mmol/L (ref 98–111)
Creatinine, Ser: 1.23 mg/dL (ref 0.61–1.24)
GFR calc Af Amer: 59 mL/min — ABNORMAL LOW (ref >60)
GFR, EST NON AFRICAN AMERICAN: 51 mL/min — AB (ref >60)
Glucose, Bld: 224 mg/dL — ABNORMAL HIGH (ref 70–99)
Potassium: 3.3 mmol/L — ABNORMAL LOW (ref 3.5–5.1)
Sodium: 138 mmol/L (ref 135–145)

## 2018-05-20 LAB — CBC
HCT: 38.2 % — ABNORMAL LOW (ref 39.0–52.0)
Hemoglobin: 12.5 g/dL — ABNORMAL LOW (ref 13.0–17.0)
MCH: 30.3 pg (ref 26.0–34.0)
MCHC: 32.7 g/dL (ref 30.0–36.0)
MCV: 92.7 fL (ref 80.0–100.0)
NRBC: 0 % (ref 0.0–0.2)
Platelets: 232 10*3/uL (ref 150–400)
RBC: 4.12 MIL/uL — ABNORMAL LOW (ref 4.22–5.81)
RDW: 13.9 % (ref 11.5–15.5)
WBC: 13.5 10*3/uL — ABNORMAL HIGH (ref 4.0–10.5)

## 2018-05-20 LAB — GLUCOSE, CAPILLARY: Glucose-Capillary: 195 mg/dL — ABNORMAL HIGH (ref 70–99)

## 2018-05-20 MED ORDER — AZITHROMYCIN 250 MG PO TABS
500.0000 mg | ORAL_TABLET | Freq: Once | ORAL | Status: AC
  Administered 2018-05-20: 500 mg via ORAL
  Filled 2018-05-20: qty 2

## 2018-05-20 MED ORDER — PREDNISONE 10 MG PO TABS: 60.0000 mg | ORAL_TABLET | Freq: Every day | ORAL | 0 refills | Status: DC

## 2018-05-20 MED ORDER — GUAIFENESIN ER 600 MG PO TB12: 600.0000 mg | ORAL_TABLET | Freq: Two times a day (BID) | ORAL | 0 refills | Status: AC | PRN

## 2018-05-20 MED ORDER — HEPARIN SOD (PORK) LOCK FLUSH 100 UNIT/ML IV SOLN
500.0000 [IU] | INTRAVENOUS | Status: AC | PRN
  Administered 2018-05-20: 500 [IU]

## 2018-05-20 MED ORDER — AZITHROMYCIN 500 MG PO TABS: 500.0000 mg | ORAL_TABLET | Freq: Once | ORAL | 0 refills | Status: AC

## 2018-05-20 MED ORDER — POTASSIUM CHLORIDE CRYS ER 20 MEQ PO TBCR
40.0000 meq | EXTENDED_RELEASE_TABLET | Freq: Once | ORAL | Status: AC
  Administered 2018-05-20: 40 meq via ORAL
  Filled 2018-05-20: qty 2

## 2018-05-20 MED ORDER — ORAL CARE MOUTH RINSE: 15.0000 mL | Freq: Two times a day (BID) | OROMUCOSAL | Status: DC

## 2018-05-20 NOTE — Discharge Summary (Signed)
Physician Discharge Summary  [name] SNK:[id number] DOB: October 05, 1924 DOA: 05/17/2018  PCP: Patrick Solian, MD  Admit date: 05/17/2018 Discharge date: 05/20/2018  Admitted From: home  Disposition:  home   Recommendations for Outpatient Follow-up:  1. F/u Cr on lasix  Home Health:  none  Equipment/Devices: rolling walker    Discharge Condition:  stable   CODE STATUS:  Full code   Diet recommendation:  Heart healthy and diabetic Consultations:  none    Discharge Diagnoses:  Principal Problem:   Acute on chronic Respiratory failure -  COPD with acute exacerbation  Active Problems:   Diabetic peripheral neuropathy (HCC)   Benign prostatic hyperplasia   Paroxysmal atrial fibrillation (HCC)   Chronic diastolic (congestive) heart failure (HCC)   Hypothyroidism     HPI: [name]  is a 84 y.o. 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  Reports fever and chills at home.  In the emergency room his chest x-ray was positive for COPD, and interstitial lung disease with no acute changes, his exam showed scattered rhonchi and his lactic acid was elevated at 4.37.  Patient was started on BiPAP.  IV cefepime and Zithromax were started in addition to neb treatments.  Patient reports some pleuritic chest pain episodes yesterday but not today.  Patient has nausea but no vomiting, no diarrhea.  Patient lives alone at home and his daughter was at bedside during decision making.   Hospital Course:  Acute on chronic Respiratory failure- COPD on 6 L/min at baseline COPD exacerbation/Acute bronchitis - needed BiPAP on admission - prednisone taper has been ordered & Azithromycin for total 5 days -cont home Nebs and inhalers - he  has returned to his baseline requirement of 6 L & ambulating at baseline - PT eval completed- rolling walker recommended- no HHPT needed  Elevated Lactic acid/ leukocytosis > sepsis  - in setting of above infection   AKI on CKD 3 -baseline Cr about 1.1-1.2- admitted with 1.68- admitted with nausea and may have had poor oral intake at home - Lasix held - Cr improved to baseline and thus Lasix resumed  DM2 - cont home insuline doses- sugars slightly elevated due to steroids     Discharge Exam: Vitals:   05/20/18 0700 05/20/18 0813  BP: 117/68 117/68  Pulse: 74 73  Resp: 14   Temp: 97.7 F (36.5 C)   SpO2: 93% 94%   Vitals:   05/19/18 2246 05/20/18 0434 05/20/18 0700 05/20/18 0813  BP:  111/66 117/68 117/68  Pulse: (!) 131 63 74 73  Resp:   14   Temp:   97.7 F (36.5 C)   TempSrc:   Oral   SpO2: 90% 95% 93% 94%  Weight:      Height:        General: Pt is alert, awake, not in acute distress Cardiovascular: RRR, S1/S2 +, no rubs, no gallops Respiratory: CTA bilaterally, no wheezing, no rhonchi Abdominal: Soft, NT, ND, bowel sounds + Extremities: no edema, no cyanosis   Discharge Instructions  Discharge Instructions    (HEART FAILURE PATIENTS) Call MD:  Anytime you have any of the following symptoms: 1) 3 pound weight gain in 24 hours or 5 pounds in 1 week 2) shortness of  breath, with or without a dry hacking cough 3) swelling in the hands, feet or stomach 4) if you have to sleep on extra pillows at night in order to breathe.   Complete by:  As directed    Diet - low sodium heart healthy   Complete by:  As directed    Diet Carb Modified   Complete by:  As directed    Increase activity slowly   Complete by:  As directed      Allergies as of 05/20/2018   No Known Allergies     Medication List    TAKE these medications   acetaminophen 325 MG tablet Commonly known as:  TYLENOL Take 650 mg by mouth every 4 (four) hours as needed for headache (chills/low grade  fever).   amiodarone 200 MG tablet Commonly known as:  PACERONE Take 200 mg by mouth See admin instructions. Take one tablet (200 mg) by mouth on Monday, Wednesday, Friday mornings, may also take one tablet (200 mg) as needed for palpitations   apixaban 5 MG Tabs tablet Commonly known as:  ELIQUIS Take 1 tablet (5 mg total) by mouth 2 (two) times daily.   atorvastatin 20 MG tablet Commonly known as:  LIPITOR Take 20 mg by mouth at bedtime.   azithromycin 500 MG tablet Commonly known as:  ZITHROMAX Take 1 tablet (500 mg total) by mouth once for 1 dose. Take 1 tablet daily for 3 days. Start taking on:  05/21/2018   cetirizine 10 MG tablet Commonly known as:  ZYRTEC Take 10 mg by mouth daily.   diltiazem 120 MG 24 hr capsule Commonly known as:  CARDIZEM CD Take 120 mg by mouth daily.   Fluticasone-Umeclidin-Vilant 100-62.5-25 MCG/INH Aepb Inhale 1 puff into the lungs daily.   furosemide 40 MG tablet Commonly known as:  LASIX Take 80 mg by mouth daily.   glucosamine-chondroitin 500-400 MG tablet Take 1 tablet by mouth 2 (two) times daily with a meal.   guaiFENesin 600 MG 12 hr tablet Commonly known as:  MUCINEX Take 1 tablet (600 mg total) by mouth 2 (two) times daily as needed for cough or to loosen phlegm.   insulin lispro 100 UNIT/ML injection Commonly known as:  HUMALOG Inject 6-7 Units into the skin 3 (three) times daily as needed for high blood sugar (CBG >150 (150-250 6 units, >250 7 units)).   ipratropium-albuterol 0.5-2.5 (3) MG/3ML Soln Commonly known as:  DUONEB Take 3 mLs by nebulization every 6 (six) hours as needed. What changed:  reasons to take this   LEVEMIR FLEXTOUCH 100 UNIT/ML Pen Generic drug:  Insulin Detemir Inject 20-24 Units into the skin See admin instructions. Inject 24 units subcutaneously before breakfast, inject 20 units at bedtime, reported 10/29/17.   levothyroxine 25 MCG tablet Commonly known as:  SYNTHROID, LEVOTHROID Take 25 mcg by  mouth daily before breakfast.   metFORMIN 500 MG 24 hr tablet Commonly known as:  GLUCOPHAGE-XR Take 500 mg by mouth 2 (two) times daily after a meal.   ONETOUCH VERIO test strip Generic drug:  glucose blood   oxybutynin 5 MG tablet Commonly known as:  DITROPAN Take 5 mg by mouth at bedtime.   predniSONE 10 MG tablet Commonly known as:  DELTASONE Take 6 tablets (60 mg total) by mouth daily with breakfast. Taper by 10 mg daily until tablets are finished What changed:    how much to take  how to take this  when to take this  additional instructions  PROAIR HFA 108 (90 Base) MCG/ACT inhaler Generic drug:  albuterol INHALE TWO PUFFS BY MOUTH EVERY 6 HOURS AS NEEDED FOR WHEEZING AND FOR SHORTNESS OF BREATH What changed:  See the new instructions.   albuterol (2.5 MG/3ML) 0.083% nebulizer solution Commonly known as:  PROVENTIL Take 3 mLs (2.5 mg total) by nebulization 3 (three) times daily. 3 times daily times 5 days then every 6 hours as needed. What changed:  additional instructions   REFRESH OP Place 1 drop into both eyes at bedtime.   temazepam 15 MG capsule Commonly known as:  RESTORIL TAKE ONE CAPSULE BY MOUTH AT BEDTIME AS NEEDED FOR SLEEP What changed:    when to take this  additional instructions            Durable Medical Equipment  (From admission, onward)         Start     Ordered   05/19/18 1517  For home use only DME 4 wheeled rolling walker with seat  Once    Question:  Patient needs a walker to treat with the following condition  Answer:  COPD exacerbation ([place])   05/19/18 [place] Follow up.   Why:  Will deliver [employer] with seat to room prior to [employer] information: [address] [place] [address] [phone]        Patrick Solian, MD. Schedule an appointment as soon as possible for a visit.   Specialty:  Internal Medicine Why:  by Friday for a  hospital follow up Contact information: [address] [place] [address] [phone]          No Known Allergies   Procedures/Studies:    Dg Chest Portable 1 View  Result Date: 05/17/2018 CLINICAL DATA:  Pts family states that pt has been having SOB and CP for 2 days. Pt is a previous smoker and had a mass in his lung that was treated w/o surgery. EXAM: PORTABLE CHEST 1 VIEW COMPARISON:  11/27/2017 FINDINGS: Cardiac silhouette is normal in size. No mediastinal or hilar masses. There are thickened bronchovascular and interstitial markings most evident in the lung bases, stable compared to the prior exam. No discrete lung mass. No convincing pneumonia and no evidence of pulmonary edema. No pleural effusion or pneumothorax. Left anterior chest wall Port-A-Cath is stable, tip in the lower superior vena cava. Skeletal structures are grossly intact. IMPRESSION: 1. No acute findings. 2. Chronic changes of COPD and interstitial lung disease. Electronically Signed   By: Lajean Manes M.D.   On: 05/17/2018 16:53     The results of significant diagnostics from this hospitalization (including imaging, microbiology, ancillary and laboratory) are listed below for reference.     Microbiology: Recent Results (from the past 240 hour(s))  Culture, blood (routine x 2)     Status: None (Preliminary result)   Collection Time: 05/17/18  3:57 PM  Result Value Ref Range Status   Specimen Description BLOOD PORTA CATH  Final   Special Requests   Final    BOTTLES DRAWN AEROBIC AND ANAEROBIC Blood Culture adequate volume   Culture   Final    NO GROWTH 2 DAYS Performed at [facility], [address], [place], [place] [address]    Report Status PENDING  Incomplete  Culture, blood (routine x 2)     Status: None (Preliminary result)   Collection Time: 05/17/18  4:13  PM  Result Value Ref Range Status   Specimen Description BLOOD BLOOD RIGHT FOREARM  Final   Special Requests   Final    Blood  Culture adequate volume BOTTLES DRAWN AEROBIC AND ANAEROBIC   Culture   Final    NO GROWTH 2 DAYS Performed at [facility], [place] [address], [place], [place] [address]    Report Status PENDING  Incomplete  MRSA PCR Screening     Status: None   Collection Time: 05/18/18  5:29 AM  Result Value Ref Range Status   MRSA by PCR NEGATIVE NEGATIVE Final    Comment:        The GeneXpert MRSA Assay (FDA approved for NASAL specimens only), is one component of a comprehensive MRSA colonization surveillance program. It is not intended to diagnose MRSA infection nor to guide or monitor treatment for MRSA infections. Performed at [facility], [place] [address] [place], [place] [address]   Culture, sputum-assessment     Status: None   Collection Time: 05/18/18  3:30 PM  Result Value Ref Range Status   Specimen Description SPUTUM  Final   Special Requests NONE  Final   Sputum evaluation   Final    THIS SPECIMEN IS ACCEPTABLE FOR SPUTUM CULTURE Performed at [facility], [address] [place], [place] [address]    Report Status 05/18/2018 FINAL  Final  Culture, respiratory     Status: None (Preliminary result)   Collection Time: 05/18/18  3:30 PM  Result Value Ref Range Status   Specimen Description SPUTUM  Final   Special Requests NONE Reflexed from [id number]  Final   Gram Stain   Final    MODERATE WBC PRESENT, PREDOMINANTLY PMN FEW GRAM POSITIVE COCCI FEW GRAM POSITIVE RODS    Culture   Final    NO GROWTH 2 DAYS Performed at [facility], [place] [address] [place], [place] [address]    Report Status PENDING  Incomplete     Labs: BNP (last 3 results) Recent Labs    10/07/17 0320 05/17/18 1613  BNP 77.5 170.0*   Basic Metabolic Panel: Recent Labs  Lab 05/17/18 1613 05/18/18 0324 05/19/18 1038 05/20/18 0438  NA 133* 135 135 138  K 3.6 3.4* 3.8 3.3*  CL 93* 97* 99 101  CO2 23 25 24 27   GLUCOSE 385* 296* 259* 224*  BUN 28* 22 25* 31*  CREATININE  1.68* 1.43* 1.29* 1.23  CALCIUM 8.6* 8.2* 8.5* 8.2*   Liver Function Tests: No results for input(s): AST, ALT, ALKPHOS, BILITOT, PROT, ALBUMIN in the last 168 hours. No results for input(s): LIPASE, AMYLASE in the last 168 hours. No results for input(s): AMMONIA in the last 168 hours. CBC: Recent Labs  Lab 05/17/18 1613 05/18/18 0324 05/19/18 1038 05/20/18 0438  WBC 21.8* 16.0* 20.9* 13.5*  NEUTROABS 19.7*  --   --   --   HGB 14.2 12.5* 13.1 12.5*  HCT 44.1 40.6 41.4 38.2*  MCV 93.0 95.3 91.8 92.7  PLT 208 204 270 232   Cardiac Enzymes: No results for input(s): CKTOTAL, CKMB, CKMBINDEX, TROPONINI in the last 168 hours. BNP: Invalid input(s): POCBNP CBG: Recent Labs  Lab 05/19/18 0722 05/19/18 1129 05/19/18 1628 05/19/18 2149 05/20/18 0731  GLUCAP 184* 244* 243* 260* 195*   D-Dimer No results for input(s): DDIMER in the last 72 hours. Hgb A1c No results for input(s): HGBA1C in the last 72 hours. Lipid Profile No results for input(s): CHOL,  HDL, LDLCALC, TRIG, CHOLHDL, LDLDIRECT in the last 72 hours. Thyroid function studies No results for input(s): TSH, T4TOTAL, T3FREE, THYROIDAB in the last 72 hours.  Invalid input(s): FREET3 Anemia work up No results for input(s): VITAMINB12, FOLATE, FERRITIN, TIBC, IRON, RETICCTPCT in the last 72 hours. Urinalysis    Component Value Date/Time   COLORURINE YELLOW 05/17/2018 1819   APPEARANCEUR CLEAR 05/17/2018 1819   LABSPEC 1.009 05/17/2018 1819   PHURINE 5.0 05/17/2018 1819   GLUCOSEU >=500 (A) 05/17/2018 1819   HGBUR NEGATIVE 05/17/2018 1819   BILIRUBINUR NEGATIVE 05/17/2018 1819   KETONESUR NEGATIVE 05/17/2018 1819   PROTEINUR NEGATIVE 05/17/2018 1819   UROBILINOGEN 0.2 12/09/2010 1650   NITRITE NEGATIVE 05/17/2018 1819   LEUKOCYTESUR NEGATIVE 05/17/2018 1819   Sepsis Labs Invalid input(s): PROCALCITONIN,  WBC,  LACTICIDVEN Microbiology Recent Results (from the past 240 hour(s))  Culture, blood (routine x 2)      Status: None (Preliminary result)   Collection Time: 05/17/18  3:57 PM  Result Value Ref Range Status   Specimen Description BLOOD PORTA CATH  Final   Special Requests   Final    BOTTLES DRAWN AEROBIC AND ANAEROBIC Blood Culture adequate volume   Culture   Final    NO GROWTH 2 DAYS Performed at [facility], [address], [place], [place] [address]    Report Status PENDING  Incomplete  Culture, blood (routine x 2)     Status: None (Preliminary result)   Collection Time: 05/17/18  4:13 PM  Result Value Ref Range Status   Specimen Description BLOOD BLOOD RIGHT FOREARM  Final   Special Requests   Final    Blood Culture adequate volume BOTTLES DRAWN AEROBIC AND ANAEROBIC   Culture   Final    NO GROWTH 2 DAYS Performed at [facility], [place] [address], [place], [place] [address]    Report Status PENDING  Incomplete  MRSA PCR Screening     Status: None   Collection Time: 05/18/18  5:29 AM  Result Value Ref Range Status   MRSA by PCR NEGATIVE NEGATIVE Final    Comment:        The GeneXpert MRSA Assay (FDA approved for NASAL specimens only), is one component of a comprehensive MRSA colonization surveillance program. It is not intended to diagnose MRSA infection nor to guide or monitor treatment for MRSA infections. Performed at [facility], [place] [address] [place], [place] [address]   Culture, sputum-assessment     Status: None   Collection Time: 05/18/18  3:30 PM  Result Value Ref Range Status   Specimen Description SPUTUM  Final   Special Requests NONE  Final   Sputum evaluation   Final    THIS SPECIMEN IS ACCEPTABLE FOR SPUTUM CULTURE Performed at [facility], [address], [place], [place] [address]    Report Status 05/18/2018 FINAL  Final  Culture, respiratory     Status: None (Preliminary result)   Collection Time: 05/18/18  3:30 PM  Result Value Ref Range Status   Specimen Description SPUTUM  Final   Special Requests NONE Reflexed from  [id number]  Final   Gram Stain   Final    MODERATE WBC PRESENT, PREDOMINANTLY PMN FEW GRAM POSITIVE COCCI FEW GRAM POSITIVE RODS    Culture   Final    NO GROWTH 2 DAYS Performed at [facility], [place] [address], [place], [place] [address]    Report Status PENDING  Incomplete  Time coordinating discharge in minutes: 60  SIGNED:   Debbe Odea, MD  [facility] 05/20/2018, 1:29 PM Pager   If 7PM-7AM, please contact night-coverage [email or web address] Password [email or web address]

## 2018-05-21 LAB — CULTURE, RESPIRATORY

## 2018-05-21 LAB — CULTURE, RESPIRATORY W GRAM STAIN: Culture: NORMAL

## 2018-05-22 LAB — CULTURE, BLOOD (ROUTINE X 2)
Culture: NO GROWTH
Culture: NO GROWTH
Special Requests: ADEQUATE
Special Requests: ADEQUATE

## 2018-05-25 ENCOUNTER — Encounter: Payer: Self-pay | Admitting: Cardiovascular Disease

## 2018-05-25 ENCOUNTER — Ambulatory Visit: Payer: Medicare Other | Admitting: Cardiovascular Disease

## 2018-05-25 VITALS — BP 120/70 | HR 78 | Ht 70.0 in | Wt 220.4 lb

## 2018-05-25 DIAGNOSIS — I480 Paroxysmal atrial fibrillation: Secondary | ICD-10-CM | POA: Diagnosis not present

## 2018-05-25 NOTE — Progress Notes (Signed)
Cardiology    Patient ID: [name] MRN: [id number]; DOB: 11-04-1924     Primary Care Provider: Prince Solian, MD Primary Cardiologist: Mertie Moores, MD  Primary Electrophysiologist:  None    Chief Complaint:   Dyspnea   Patient Profile:   [name] is a 84 y.o. male  -currently a patient with Dr. Wynonia Lawman  -  with congestive heart failure, atrial fibrillation who we are seeing for the first time today for further evaluation and management of his congestive heart failure and atrial fibrillation.  History of Present Illness:   [name] is a 84 year old gentleman with a history of acute diastolic congestive heart failure and atrial fibrillation.  He was admitted to the hospital on August 8 with worsening shortness of breath.  Also notes a history of COPD.  Is on 6 L of nasal cannula at home at baseline.  He has a history of obstructive sleep apnea.  Seen with daughter, Butch Penny .   He presents to the emergency room with 2 weeks of worsening shortness of breath, fever, and greenish sputum.  His lactic acid level was 4.3.  Not quite back to normal but is feeling better.    Echo in Nov 04, 2017   - normal LV systolic functin   EF 84-69%. Grade 1 diastolic dysfunciton Mild AS  Pulmonary pressures - moderatedly elevated with PAP = 52 mmhg.   Had leg edema - has resolved.   Past Medical History:  Diagnosis Date  . Acute diastolic CHF (congestive heart failure) ([place])   . Allergic conjunctivitis   . Allergic rhinitis   . Atrial fibrillation ([place])   . BPH (benign prostatic hyperplasia)   . Cataract   . Cellulitis    hx  . COPD (chronic obstructive pulmonary disease) ([place])       . Diabetic peripheral neuropathy ([place])   . Diverticulosis    hx  . DJD (degenerative joint disease)    Left Knee  . DVT (deep venous thrombosis) ([place])    IN RIGHT ARM 11/2010   . Hearing loss   . Hyperlipidemia   . Hypertension   . Iron deficiency anemia 03/27/2016  . Iron deficiency anemia  due to chronic blood loss 03/27/2016  . Lung cancer ([place])     history of stage IIIA non-small cell lung cancer who is currently being treated with both  Radiation and Chemotherapy  . Malabsorption of iron 03/27/2016  . Obstructive sleep apnea (adult)  10/29/2012   This patient has mild obstructive sleep apnea, tested in March 2014 at [facility]. He had been a CPAP user for 14 years. AHi 10.7 He was titrated to a pressure of 9 cm water( after auto - titration). CPAP at night  --- 8-10 YR AGO....,OSA-diagnosed 2000  . Pneumonia    history  . Secondary diabetes with peripheral neuropathy ([place]) 11/07/2012    Past Surgical History:  Procedure Laterality Date  . CARDIAC CATHETERIZATION  2006  . deviated septum repair    . FIBEROPTIC BRONCHOSCOPY  12/11/2010  . Insertion of a left subclavian Port-A-Cath.  12/17/10   [place]  . PORTACATH PLACEMENT  04/23/2011   Procedure: INSERTION PORT-A-CATH;  Surgeon: Pierre Bali, MD;  Location: [facility];  Service: Thoracic;;  Revision of  Porta-Cath  . VIDEO BRONCHOSCOPY  09/27/2011   Procedure: VIDEO BRONCHOSCOPY;  Surgeon: Nicanor Alcon, MD;  Location[facility];  Service: Thoracic;  Laterality: N/A;     Medications Prior to Admission: Prior to  Admission medications   Medication Sig Start Date End Date Taking? Authorizing Provider  acetaminophen (TYLENOL) 325 MG tablet Take 650 mg by mouth every 4 (four) hours as needed for headache (chills/low grade fever).   Yes [provider]  albuterol (PROVENTIL) (2.5 MG/3ML) 0.083% nebulizer solution Take 3 mLs (2.5 mg total) by nebulization 3 (three) times daily. 3 times daily times 5 days then every 6 hours as needed. 09/16/17  Yes Eugenie Filler, MD  amiodarone (PACERONE) 200 MG tablet Take 200 mg by mouth See admin instructions. Take one tablet (200 mg) by mouth on Monday, Wednesday, Friday mornings, may also take one tablet (200 mg) as needed for palpitations   Yes [provider]  apixaban  (ELIQUIS) 5 MG TABS tablet Take 1 tablet (5 mg total) by mouth 2 (two) times daily. 11/12/12  Yes Jacolyn Reedy, MD  atorvastatin (LIPITOR) 20 MG tablet Take 20 mg by mouth at bedtime.  03/05/17  Yes [provider]  cetirizine (ZYRTEC) 10 MG tablet Take 10 mg by mouth daily.    Yes [provider]  diltiazem (CARDIZEM CD) 120 MG 24 hr capsule Take 120 mg by mouth daily.   Yes [provider]  fluconazole (DIFLUCAN) 150 MG tablet Take 150 mg by mouth daily. 05/23/18 05/28/18 Yes [provider]  Fluticasone-Umeclidin-Vilant (TRELEGY ELLIPTA) 100-62.5-25 MCG/INH AEPB Inhale 1 puff into the lungs daily. 12/12/17  Yes Collene Gobble, MD  furosemide (LASIX) 40 MG tablet Take 80 mg by mouth daily.    Yes [provider]  glucosamine-chondroitin 500-400 MG tablet Take 1 tablet by mouth 2 (two) times daily with a meal.    Yes [provider]  glucose blood (ONETOUCH VERIO) test strip  09/23/17  Yes [provider]  guaiFENesin (MUCINEX) 600 MG 12 hr tablet Take 1 tablet (600 mg total) by mouth 2 (two) times daily as needed for cough or to loosen phlegm. 05/20/18  Yes Debbe Odea, MD  Insulin Detemir (LEVEMIR FLEXTOUCH) 100 UNIT/ML Pen Inject 20-24 Units into the skin See admin instructions. Inject 24 units subcutaneously before breakfast, inject 20 units at bedtime, reported 10/29/17.   Yes [provider]  insulin lispro (HUMALOG) 100 UNIT/ML injection Inject 6-7 Units into the skin 3 (three) times daily as needed for high blood sugar (CBG >150 (150-250 6 units, >250 7 units)).    Yes [provider]  ipratropium-albuterol (DUONEB) 0.5-2.5 (3) MG/3ML SOLN Take 3 mLs by nebulization every 6 (six) hours as needed. 10/09/17  Yes Amin, Jeanella Flattery, MD  levothyroxine (SYNTHROID, LEVOTHROID) 25 MCG tablet Take 25 mcg by mouth daily before breakfast.   Yes [provider]  metFORMIN (GLUCOPHAGE-XR) 500 MG 24 hr tablet Take 500  mg by mouth 2 (two) times daily after a meal.  07/25/16  Yes [provider]  oxybutynin (DITROPAN) 5 MG tablet Take 5 mg by mouth at bedtime.    Yes [provider]  Polyvinyl Alcohol-Povidone (REFRESH OP) Place 1 drop into both eyes at bedtime.    Yes [provider]  predniSONE (DELTASONE) 10 MG tablet Take 6 tablets (60 mg total) by mouth daily with breakfast. Taper by 10 mg daily until tablets are finished 05/20/18  Yes Rizwan, Saima, MD  PROAIR HFA 108 (90 Base) MCG/ACT inhaler INHALE TWO PUFFS BY MOUTH EVERY 6 HOURS AS NEEDED FOR WHEEZING AND FOR SHORTNESS OF BREATH 04/10/17  Yes Collene Gobble, MD  temazepam (RESTORIL) 15 MG capsule TAKE ONE  CAPSULE BY MOUTH AT BEDTIME AS NEEDED FOR SLEEP 02/02/14  Yes Volanda Napoleon, MD     Allergies:   No Known Allergies  Social History:   Social History   Socioeconomic History  . Marital status: Widowed    Spouse name: Not on file  . Number of children: 5  . Years of education: 8  . Highest education level: Not on file  Occupational History  . Occupation: farmer    Comment: retired    Fish farm manager: RETIRED  Social Needs  . Financial resource strain: Not on file  . Food insecurity:    Worry: Not on file    Inability: Not on file  . Transportation needs:    Medical: Not on file    Non-medical: Not on file  Tobacco Use  . Smoking status: Former Smoker    Packs/day: 1.00    Years: 50.00    Pack years: 50.00    Types: Cigarettes    Start date: 05/18/1943    Last attempt to quit: 06/10/1992    Years since quitting: 25.9  . Smokeless tobacco: Never Used  . Tobacco comment: quit tobacco 3 years ago  Substance and Sexual Activity  . Alcohol use: No    Alcohol/week: 0.0 standard drinks  . Drug use: No  . Sexual activity: Not on file  Lifestyle  . Physical activity:    Days per week: Not on file    Minutes per session: Not on file  . Stress: Not on file  Relationships  . Social connections:    Talks on phone:  Not on file    Gets together: Not on file    Attends religious service: Not on file    Active member of club or organization: Not on file    Attends meetings of clubs or organizations: Not on file    Relationship status: Not on file  . Intimate partner violence:    Fear of current or ex partner: Not on file    Emotionally abused: Not on file    Physically abused: Not on file    Forced sexual activity: Not on file  Other Topics Concern  . Not on file  Social History Narrative   Patient is widowed and lives alone.   Patient is retired.   Patient has five adult children.   Patient has a 8th grade education.   Patient is right-handed.   Patient does not drink caffeine.    Family History:   The patient's family history includes Cancer in his unknown relative; Coronary artery disease in his unknown relative; Diabetes type II in his unknown relative; Multiple sclerosis in his unknown relative. There is no history of Anesthesia problems, Hypotension, Malignant hyperthermia, or Pseudochol deficiency.    ROS:  Please see the history of present illness. All other ROS reviewed and negative.     Physical Exam/Data:   Vitals:   05/25/18 1524  BP: 120/70  Pulse: 78  SpO2: 91%  Weight: 220 lb 6.4 oz (100 kg)  Height: 5\' 10"  (1.778 m)   @IOBRIEF @ Filed Weights   05/25/18 1524  Weight: 220 lb 6.4 oz (100 kg)   Body mass index is 31.62 kg/m.  General:  Elderly male,  On 6 l home O2  HEENT: normal Lymph: no adenopathy Neck: normal  Endocrine:  No thryomegaly Vascular: No carotid bruits; FA pulses 2+ bilaterally without bruits  Cardiac:  normal S1, S2; RRR;  Soft systlic murmur  Lungs:  No rales, no rhonchi  Abd: soft, nontender, no hepatomegaly  Ext: no edema  Musculoskeletal:  No deformities, BUE and BLE strength normal and equal Skin: warm and dry  Neuro:  CNs 2-12 intact, no focal abnormalities noted Psych:  Normal affect    EKG:    Relevant CV Studies:   Laboratory  Data:  Chemistry Recent Labs  Lab 05/19/18 1038 05/20/18 0438  NA 135 138  K 3.8 3.3*  CL 99 101  CO2 24 27  GLUCOSE 259* 224*  BUN 25* 31*  CREATININE 1.29* 1.23  CALCIUM 8.5* 8.2*  GFRNONAA 48* 51*  GFRAA 56* 59*  ANIONGAP 12 10    No results for input(s): PROT, ALBUMIN, AST, ALT, ALKPHOS, BILITOT in the last 168 hours. Hematology Recent Labs  Lab 05/19/18 1038 05/20/18 0438  WBC 20.9* 13.5*  RBC 4.51 4.12*  HGB 13.1 12.5*  HCT 41.4 38.2*  MCV 91.8 92.7  MCH 29.0 30.3  MCHC 31.6 32.7  RDW 13.9 13.9  PLT 270 232   Cardiac EnzymesNo results for input(s): TROPONINI in the last 168 hours. No results for input(s): TROPIPOC in the last 168 hours.  BNPNo results for input(s): BNP, PROBNP in the last 168 hours.  DDimer No results for input(s): DDIMER in the last 168 hours.  Radiology/Studies:  No results found.  Assessment and Plan:   1. Acute on chronic diastolic congestive heart failure: The patient was admitted to the hospital a month ago with pneumonia, fever, green sputum production.  He developed some acute on chronic heart failure at that time.  His heart failure seems to be largely cleared up and is close to his baseline.  He still not quite as strong as he used to be.  At this point will have him continue with the same medications.  Patient of Dr. Thurman Coyer.  We will have him come back to see Dr. Wynonia Lawman in 3 months.  I will be happy to see him if needed.     Signed, Mertie Moores, MD  05/25/2018 3:59 PM

## 2018-05-25 NOTE — Patient Instructions (Signed)
Medication Instructions:  Your physician recommends that you continue on your current medications as directed. Please refer to the Current Medication list given to you today.  If you need a refill on your cardiac medications before your next appointment, please call your pharmacy.    Lab work: None Ordered    Testing/Procedures: None Ordered    Follow-Up: Your physician recommends that you schedule a follow-up appointment in:  3 months with Dr. Wynonia Lawman

## 2018-06-23 ENCOUNTER — Ambulatory Visit: Payer: Medicare Other | Admitting: Emergency Medicine

## 2018-06-23 ENCOUNTER — Encounter: Payer: Self-pay | Admitting: Emergency Medicine

## 2018-06-23 DIAGNOSIS — J9611 Chronic respiratory failure with hypoxia: Secondary | ICD-10-CM | POA: Diagnosis not present

## 2018-06-23 DIAGNOSIS — J42 Unspecified chronic bronchitis: Secondary | ICD-10-CM | POA: Diagnosis not present

## 2018-06-23 DIAGNOSIS — C3400 Malignant neoplasm of unspecified main bronchus: Secondary | ICD-10-CM | POA: Diagnosis not present

## 2018-06-23 NOTE — Assessment & Plan Note (Signed)
Following serial chest x-rays with Dr. Marin Olp

## 2018-06-23 NOTE — Progress Notes (Signed)
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  Also with chronic diastolic dysfunction and some associated pleural effusions.  He has chronic hypoxemic respiratory failure in the setting of all the above.  He had to come off Trelegy due to thrush, we restarted it 2 months ago.  He also was treated with fluconazole.  He has DuoNeb's available and is using them approximately.  I decreased his prednisone to 15 mg daily at his last visit, then to 10mg . He appears to have tolerated. Feels that his breathing is actually better. He is tolerating the Trelegy now. His O2 is at 5-6 L/min. Dr Katheran Awe is following CXR's.   ROV 06/23/18 --85 year old male with COPD, OSA, history of non-small cell lung cancer in the right middle lobe that was treated with chemoradiation, hypertension with diastolic dysfunction and some associated chronic pleural effusions.  He has chronic hypoxemic respiratory failure.  Unfortunately since our last visit together hewas admitted for an exacerbation of COPD +/- CHF +/- UTI in early December, seemed to coincide with the taper of his pred. CXR 05/17/18 without infiltrates.  His prednisone is back up to 10 mg daily.  He is otherwise managed on Trelegy, uses albuterol approximately 0-2x a day.  He feels better - breathing improved. His CBG's have had a lot of variability.    Vitals:   06/23/18 1121  BP: 130/66  Pulse: 63  SpO2: 90%  Weight: 220 lb (99.8 kg)  Height: 5\' 10"  (1.778 m)   Gen: Pleasant, elderly man, in no distress,  normal affect  ENT: No lesions,  mouth clear, some whit discoloration tongue, no postnasal drip  Neck: No JVD, no stridor  Lungs: No use of accessory muscles, bibasilar insp crackles at bases.  No wheeze.   Cardiovascular: RRR, heart sounds normal, no murmur or gallops, trace to 1+ lower extremity ankle edema  Musculoskeletal: No deformities, no cyanosis or clubbing  Neuro: alert, non focal  Skin: Warm, no lesions or rashes    COPD (chronic obstructive pulmonary disease) (HCC) Recent flare of symptoms and ultimately a hospitalization in early December seem to coincide with tapering his prednisone to 0.  He is stabilizing, is now back on prednisone 10 mg daily.  I think he should continue this dose, we can consider going to 5 mg (which he has tolerated before) in the future.  Continue his Trelegy, albuterol as needed  Lung cancer Following serial chest x-rays with Dr. Marin Olp  Chronic respiratory failure with hypoxia ([place]) Continue with current oxygen at 5 to 6 L/min.   [name], MD, PhD 06/23/2018, 11:49 AM [facility] [phone] or if no answer [phone]

## 2018-06-23 NOTE — Assessment & Plan Note (Signed)
Recent flare of symptoms and ultimately a hospitalization in early December seem to coincide with tapering his prednisone to 0.  He is stabilizing, is now back on prednisone 10 mg daily.  I think he should continue this dose, we can consider going to 5 mg (which he has tolerated before) in the future.  Continue his Trelegy, albuterol as needed

## 2018-06-23 NOTE — Assessment & Plan Note (Signed)
Continue with current oxygen at 5 to 6 L/min.

## 2018-06-23 NOTE — Patient Instructions (Signed)
Please continue prednisone 10 mg daily.  We can revisit possibly decreasing in the future but I do not believe we will be able to stop it altogether based on your symptoms when you are not using it. Please continue Trelegy 1 inhalation daily.  Rinse and gargle after you use it. Use your albuterol either 1 nebulizer treatment or 2 puffs of your inhaler if needed for shortness of breath, chest tightness, wheezing. Please continue your oxygen at 5 to 6 L/min as you have been using it. Continue to follow serial chest x-rays with Dr. Marin Olp Follow with Dr Lamonte Sakai in 4 months or sooner if you have any problems.

## 2018-07-06 ENCOUNTER — Telehealth: Payer: Self-pay | Admitting: Cardiology

## 2018-07-06 NOTE — Telephone Encounter (Signed)
° °  1. Which medications need to be refilled? (please list name of each medication and dose if known) Amiodarone 200mg  tablets  2. Which pharmacy/location (including street and city if local pharmacy) is medication to be sent to?[facility]  3. Do they need a 30 day or 90 day supply? [place]

## 2018-07-08 MED ORDER — AMIODARONE HCL 200 MG PO TABS: ORAL_TABLET | ORAL | 3 refills | Status: AC

## 2018-07-08 NOTE — Telephone Encounter (Signed)
Pt's medication was sent to pt's pharmacy as requested. Confirmation received.  °

## 2018-07-10 ENCOUNTER — Telehealth: Payer: Self-pay | Admitting: Cardiology

## 2018-07-10 NOTE — Telephone Encounter (Signed)
°  1. Which medications need to be refilled? (please list name of each medication and dose if known) amiodarone tabl 200mg , take on Monday Wednesday and Friday as directed  2. Which pharmacy/location (including street and city if local pharmacy) is medication to be sent to [facility]  3. Do they need a 30 day or 90 day supply? [place]

## 2018-07-10 NOTE — Telephone Encounter (Signed)
Pt's medication was sent to pt's pharmacy as requested. Confirmation received.  °

## 2018-07-16 ENCOUNTER — Ambulatory Visit: Payer: Medicare Other | Admitting: Cardiovascular Disease

## 2018-07-16 ENCOUNTER — Encounter (INDEPENDENT_AMBULATORY_CARE_PROVIDER_SITE_OTHER): Payer: Commercial Managed Care - HMO | Admitting: Ophthalmology

## 2018-07-16 DIAGNOSIS — E113393 Type 2 diabetes mellitus with moderate nonproliferative diabetic retinopathy without macular edema, bilateral: Secondary | ICD-10-CM | POA: Diagnosis not present

## 2018-07-16 DIAGNOSIS — H43813 Vitreous degeneration, bilateral: Secondary | ICD-10-CM

## 2018-07-16 DIAGNOSIS — E11319 Type 2 diabetes mellitus with unspecified diabetic retinopathy without macular edema: Secondary | ICD-10-CM | POA: Diagnosis not present

## 2018-07-16 DIAGNOSIS — H353132 Nonexudative age-related macular degeneration, bilateral, intermediate dry stage: Secondary | ICD-10-CM

## 2018-07-17 ENCOUNTER — Encounter: Payer: Self-pay | Admitting: Hematology & Oncology

## 2018-07-17 ENCOUNTER — Inpatient Hospital Stay: Payer: Medicare Other | Attending: Hematology & Oncology

## 2018-07-17 ENCOUNTER — Inpatient Hospital Stay (HOSPITAL_BASED_OUTPATIENT_CLINIC_OR_DEPARTMENT_OTHER): Payer: Medicare Other | Admitting: Hematology & Oncology

## 2018-07-17 ENCOUNTER — Telehealth: Payer: Self-pay | Admitting: Hematology & Oncology

## 2018-07-17 ENCOUNTER — Other Ambulatory Visit: Payer: Self-pay

## 2018-07-17 ENCOUNTER — Inpatient Hospital Stay: Payer: Medicare Other

## 2018-07-17 VITALS — BP 122/67 | HR 76 | Temp 97.7°F | Resp 20 | Wt 223.0 lb

## 2018-07-17 DIAGNOSIS — I480 Paroxysmal atrial fibrillation: Secondary | ICD-10-CM | POA: Diagnosis not present

## 2018-07-17 DIAGNOSIS — Z7901 Long term (current) use of anticoagulants: Secondary | ICD-10-CM | POA: Insufficient documentation

## 2018-07-17 DIAGNOSIS — D508 Other iron deficiency anemias: Secondary | ICD-10-CM | POA: Insufficient documentation

## 2018-07-17 DIAGNOSIS — Z85118 Personal history of other malignant neoplasm of bronchus and lung: Secondary | ICD-10-CM

## 2018-07-17 DIAGNOSIS — Z9981 Dependence on supplemental oxygen: Secondary | ICD-10-CM

## 2018-07-17 DIAGNOSIS — Z86711 Personal history of pulmonary embolism: Secondary | ICD-10-CM | POA: Insufficient documentation

## 2018-07-17 DIAGNOSIS — Z95828 Presence of other vascular implants and grafts: Secondary | ICD-10-CM

## 2018-07-17 DIAGNOSIS — K909 Intestinal malabsorption, unspecified: Secondary | ICD-10-CM

## 2018-07-17 DIAGNOSIS — D500 Iron deficiency anemia secondary to blood loss (chronic): Secondary | ICD-10-CM

## 2018-07-17 DIAGNOSIS — J449 Chronic obstructive pulmonary disease, unspecified: Secondary | ICD-10-CM | POA: Insufficient documentation

## 2018-07-17 DIAGNOSIS — C3400 Malignant neoplasm of unspecified main bronchus: Secondary | ICD-10-CM

## 2018-07-17 LAB — CMP (CANCER CENTER ONLY)
ALBUMIN: 4.1 g/dL (ref 3.5–5.0)
ALT: 15 U/L (ref 0–44)
AST: 17 U/L (ref 15–41)
Alkaline Phosphatase: 79 U/L (ref 38–126)
Anion gap: 7 (ref 5–15)
BUN: 28 mg/dL — ABNORMAL HIGH (ref 8–23)
CO2: 34 mmol/L — AB (ref 22–32)
Calcium: 9.6 mg/dL (ref 8.9–10.3)
Chloride: 97 mmol/L — ABNORMAL LOW (ref 98–111)
Creatinine: 1.42 mg/dL — ABNORMAL HIGH (ref 0.61–1.24)
GFR, Est AFR Am: 50 mL/min — ABNORMAL LOW (ref >60)
GFR, Estimated: 43 mL/min — ABNORMAL LOW (ref >60)
Glucose, Bld: 298 mg/dL — ABNORMAL HIGH (ref 70–99)
POTASSIUM: 3.7 mmol/L (ref 3.5–5.1)
SODIUM: 138 mmol/L (ref 135–145)
Total Bilirubin: 0.5 mg/dL (ref 0.3–1.2)
Total Protein: 6.6 g/dL (ref 6.5–8.1)

## 2018-07-17 LAB — CBC WITH DIFFERENTIAL (CANCER CENTER ONLY)
Abs Immature Granulocytes: 0.08 10*3/uL — ABNORMAL HIGH (ref 0.00–0.07)
Basophils Absolute: 0 10*3/uL (ref 0.0–0.1)
Basophils Relative: 0 %
Eosinophils Absolute: 0.1 10*3/uL (ref 0.0–0.5)
Eosinophils Relative: 1 %
HCT: 44.2 % (ref 39.0–52.0)
Hemoglobin: 14.3 g/dL (ref 13.0–17.0)
Immature Granulocytes: 1 %
Lymphocytes Relative: 18 %
Lymphs Abs: 2 10*3/uL (ref 0.7–4.0)
MCH: 30.8 pg (ref 26.0–34.0)
MCHC: 32.4 g/dL (ref 30.0–36.0)
MCV: 95.3 fL (ref 80.0–100.0)
MONO ABS: 0.8 10*3/uL (ref 0.1–1.0)
Monocytes Relative: 8 %
Neutro Abs: 8 10*3/uL — ABNORMAL HIGH (ref 1.7–7.7)
Neutrophils Relative %: 72 %
Platelet Count: 205 10*3/uL (ref 150–400)
RBC: 4.64 MIL/uL (ref 4.22–5.81)
RDW: 14.1 % (ref 11.5–15.5)
WBC Count: 11 10*3/uL — ABNORMAL HIGH (ref 4.0–10.5)
nRBC: 0 % (ref 0.0–0.2)

## 2018-07-17 MED ORDER — SODIUM CHLORIDE 0.9% FLUSH
10.0000 mL | INTRAVENOUS | Status: DC | PRN
  Administered 2018-07-17: 10 mL via INTRAVENOUS
  Filled 2018-07-17: qty 10

## 2018-07-17 MED ORDER — HEPARIN SOD (PORK) LOCK FLUSH 100 UNIT/ML IV SOLN
500.0000 [IU] | Freq: Once | INTRAVENOUS | Status: AC
  Administered 2018-07-17: 500 [IU] via INTRAVENOUS
  Filled 2018-07-17: qty 5

## 2018-07-17 NOTE — Patient Instructions (Signed)

## 2018-07-17 NOTE — Progress Notes (Signed)
Hematology and Oncology Follow Up Visit  [name] [id number] 1925/02/09 85 y.o. 07/17/2018   Principle Diagnosis:  . 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 2. Paroxysmal atrial fibrillation. 3. 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 4. Iron deficiency anemia due to malabsorption  Current Therapy:   Eliquis 5 mg p.o. B.i.d. IV Feraheme as indicated     Interim History:  [name] is back for followup. He is oxygen dependent. He has bad underlying COPD and also cardiac issues. He has atrial fibrillation. He is on ELIQUIS.  So far, he is doing okay.  His 89nd birthday is coming up next week.  He is looking forward to this.  He was in the hospital recently.  As always, he is having issues with his COPD.  He has oxygen dependent.  He has had no bleeding.  There is been no issues with his bowels or bladder.  He is now 7 years out from completion of his treatment for his lung cancer.  I just do not think that we need to see him again ourselves.  His iron results have been pretty good.  We last saw him in October, his ferritin was 115 with an iron saturation of 25%.  Overall, his performance status is ECOG 3.     Medications:  Current Outpatient Medications:  .  acetaminophen (TYLENOL) 325 MG tablet, Take 650 mg by mouth every 4 (four) hours as needed for headache (chills/low grade fever)., Disp: , Rfl:  .  albuterol (PROVENTIL) (2.5 MG/3ML) 0.083% nebulizer solution, Take 3 mLs (2.5 mg total) by nebulization 3 (three) times daily. 3 times daily times 5 days then every 6 hours as needed., Disp: 360 mL, Rfl: 0 .  amiodarone (PACERONE) 200 MG tablet, Take one tablet (200 mg) by mouth on Monday, Wednesday, Friday mornings, may also take one tablet (200 mg) as needed for palpitations, Disp: 45 tablet, Rfl: 3 .  apixaban (ELIQUIS) 5 MG TABS tablet, Take 1 tablet (5 mg total) by mouth 2 (two) times daily., Disp: 60  tablet, Rfl: 12 .  atorvastatin (LIPITOR) 20 MG tablet, Take 20 mg by mouth at bedtime. , Disp: , Rfl:  .  cetirizine (ZYRTEC) 10 MG tablet, Take 10 mg by mouth daily. , Disp: , Rfl:  .  diltiazem (CARDIZEM CD) 120 MG 24 hr capsule, Take 120 mg by mouth daily., Disp: , Rfl:  .  Fluticasone-Umeclidin-Vilant (TRELEGY ELLIPTA) 100-62.5-25 MCG/INH AEPB, Inhale 1 puff into the lungs daily., Disp: 180 each, Rfl: 3 .  furosemide (LASIX) 40 MG tablet, Take 80 mg by mouth daily. , Disp: , Rfl:  .  glucosamine-chondroitin 500-400 MG tablet, Take 1 tablet by mouth 2 (two) times daily with a meal. , Disp: , Rfl:  .  glucose blood (ONETOUCH VERIO) test strip, , Disp: , Rfl:  .  guaiFENesin (MUCINEX) 600 MG 12 hr tablet, Take 1 tablet (600 mg total) by mouth 2 (two) times daily as needed for cough or to loosen phlegm., Disp: 60 tablet, Rfl: 0 .  Insulin Detemir (LEVEMIR FLEXTOUCH) 100 UNIT/ML Pen, Inject 20-24 Units into the skin See admin instructions. Inject 24 units subcutaneously before breakfast, inject 20 units at bedtime, reported 10/29/17., Disp: , Rfl:  .  insulin lispro (HUMALOG) 100 UNIT/ML injection, Inject 6-7 Units into the skin 3 (three) times daily as needed for high blood sugar (CBG >150 (150-250 6 units, >250  7 units)). , Disp: , Rfl:  .  ipratropium-albuterol (DUONEB) 0.5-2.5 (3) MG/3ML SOLN, Take 3 mLs by nebulization every 6 (six) hours as needed., Disp: 360 mL, Rfl: 1 .  levothyroxine (SYNTHROID, LEVOTHROID) 25 MCG tablet, Take 25 mcg by mouth daily before breakfast., Disp: , Rfl:  .  metFORMIN (GLUCOPHAGE-XR) 500 MG 24 hr tablet, Take 500 mg by mouth 2 (two) times daily after a meal. , Disp: , Rfl:  .  oxybutynin (DITROPAN) 5 MG tablet, Take 5 mg by mouth at bedtime. , Disp: , Rfl:  .  Polyvinyl Alcohol-Povidone (REFRESH OP), Place 1 drop into both eyes at bedtime. , Disp: , Rfl:  .  predniSONE (DELTASONE) 10 MG tablet, Take 10 mg by mouth daily with breakfast., Disp: , Rfl:  .  PROAIR HFA  108 (90 Base) MCG/ACT inhaler, INHALE TWO PUFFS BY MOUTH EVERY 6 HOURS AS NEEDED FOR WHEEZING AND FOR SHORTNESS OF BREATH, Disp: 9 each, Rfl: 5 .  temazepam (RESTORIL) 15 MG capsule, TAKE ONE CAPSULE BY MOUTH AT BEDTIME AS NEEDED FOR SLEEP, Disp: 30 capsule, Rfl: 0 No current facility-administered medications for this visit.   Facility-Administered Medications Ordered in Other Visits:  .  sodium chloride flush (NS) 0.9 % injection 10 mL, 10 mL, Intravenous, PRN, Volanda Napoleon, MD, 10 mL at 11/20/16 1153  Allergies: No Known Allergies  Past Medical History, Surgical history, Social history, and Family History were reviewed and updated.  Review of Systems: Review of Systems  Constitutional: Positive for malaise/fatigue.  HENT: Negative.   Eyes: Negative.   Respiratory: Positive for shortness of breath and wheezing.   Cardiovascular: Positive for palpitations and leg swelling.  Gastrointestinal: Positive for nausea.  Genitourinary: Negative.   Musculoskeletal: Positive for joint pain.  Neurological: Positive for dizziness.  Endo/Heme/Allergies: Negative.   Psychiatric/Behavioral: Negative.      Physical Exam:  weight is 223 lb (101.2 kg). His oral temperature is 97.7 F (36.5 C). His blood pressure is 122/67 and his pulse is 76. His respiration is 20 and oxygen saturation is 92%.     Physical Exam Vitals signs reviewed.  HENT:     Head: Normocephalic and atraumatic.  Eyes:     Pupils: Pupils are equal, round, and reactive to light.  Neck:     Musculoskeletal: Normal range of motion.  Cardiovascular:     Rate and Rhythm: Normal rate and regular rhythm.     Heart sounds: Normal heart sounds.  Pulmonary:     Effort: Pulmonary effort is normal.     Breath sounds: Wheezing present.  Abdominal:     General: Bowel sounds are normal.     Palpations: Abdomen is soft.  Musculoskeletal: Normal range of motion.        General: No tenderness or deformity.  Lymphadenopathy:      Cervical: No cervical adenopathy.  Skin:    General: Skin is warm and dry.     Findings: No erythema or rash.  Neurological:     Mental Status: He is alert and oriented to person, place, and time.  Psychiatric:        Behavior: Behavior normal.        Thought Content: Thought content normal.        Judgment: Judgment normal.       Lab Results  Component Value Date   WBC 11.0 (H) 07/17/2018   HGB 14.3 07/17/2018   HCT 44.2 07/17/2018   MCV 95.3 07/17/2018   PLT 205 07/17/2018  Chemistry      Component Value Date/Time   NA 138 07/17/2018 0934   NA 139 03/26/2017 1007   NA 138 03/27/2016 1025   K 3.7 07/17/2018 0934   K 4.2 03/26/2017 1007   K 4.2 03/27/2016 1025   CL 97 (L) 07/17/2018 0934   CL 104 03/26/2017 1007   CO2 34 (H) 07/17/2018 0934   CO2 30 03/26/2017 1007   CO2 24 03/27/2016 1025   BUN 28 (H) 07/17/2018 0934   BUN 21 03/26/2017 1007   BUN 19.1 03/27/2016 1025   CREATININE 1.42 (H) 07/17/2018 0934   CREATININE 1.1 03/26/2017 1007   CREATININE 1.3 03/27/2016 1025      Component Value Date/Time   CALCIUM 9.6 07/17/2018 0934   CALCIUM 9.4 03/26/2017 1007   CALCIUM 8.7 03/27/2016 1025   ALKPHOS 79 07/17/2018 0934   ALKPHOS 99 (H) 03/26/2017 1007   ALKPHOS 85 03/27/2016 1025   AST 17 07/17/2018 0934   AST 28 03/27/2016 1025   ALT 15 07/17/2018 0934   ALT 18 03/26/2017 1007   ALT 17 03/27/2016 1025   BILITOT 0.5 07/17/2018 0934   BILITOT 0.42 03/27/2016 1025         Impression and Plan: [name] is 85 year old gentleman with a history of locally advanced-stage IIIB-adenocarcinoma of the right lung. He had contralateral mediastinal node involvement. He was treated with radiation and chemotherapy. He had a very nice response. He completed  treatment in December of 2012. Thankfully, His disease has not come back. This I am surprised by because of the extensive nature of his locally advanced disease.  For right now, we will let him go from the  practice from my point of view.  We will have him come back for Port-A-Cath flush.  We will check his labs when he has his Port-A-Cath flushed.  Otherwise, I do not think we have to see him unless he wants to see [place] or his iron studies are abnormal.  Volanda Napoleon, MD 2/7/202011:28 AM

## 2018-07-17 NOTE — Telephone Encounter (Signed)
Appointments scheduled calendar printed per 2/7 los

## 2018-07-20 ENCOUNTER — Telehealth: Payer: Self-pay | Admitting: *Deleted

## 2018-07-20 LAB — FERRITIN: FERRITIN: 121 ng/mL (ref 24–336)

## 2018-07-20 LAB — IRON AND TIBC
Iron: 82 ug/dL (ref 42–163)
Saturation Ratios: 26 % (ref 20–55)
TIBC: 318 ug/dL (ref 202–409)
UIBC: 235 ug/dL (ref 117–376)

## 2018-07-20 NOTE — Telephone Encounter (Signed)
As noted below by Dr. Ennever, I informed the patient that his iron level is OK. He verbalized understanding. °

## 2018-07-20 NOTE — Telephone Encounter (Signed)
-----   Message from Volanda Napoleon, MD sent at 07/20/2018  2:08 PM EST ----- Call - iron is ok!!  Laurey Arrow

## 2018-07-21 ENCOUNTER — Telehealth: Payer: Self-pay | Admitting: Emergency Medicine

## 2018-07-21 ENCOUNTER — Ambulatory Visit: Payer: Medicare Other | Admitting: Cardiovascular Disease

## 2018-07-21 MED ORDER — FLUTICASONE-UMECLIDIN-VILANT 100-62.5-25 MCG/INH IN AEPB: 1.0000 | INHALATION_SPRAY | Freq: Every day | RESPIRATORY_TRACT | 3 refills | Status: AC

## 2018-07-21 NOTE — Telephone Encounter (Signed)
Refill sent patient is aware.

## 2018-07-23 ENCOUNTER — Other Ambulatory Visit: Payer: Self-pay | Admitting: *Deleted

## 2018-07-30 ENCOUNTER — Encounter (HOSPITAL_COMMUNITY): Payer: Self-pay

## 2018-07-30 ENCOUNTER — Emergency Department (HOSPITAL_COMMUNITY): Payer: Medicare Other

## 2018-07-30 ENCOUNTER — Other Ambulatory Visit: Payer: Self-pay

## 2018-07-30 ENCOUNTER — Inpatient Hospital Stay (HOSPITAL_COMMUNITY)
Admission: EM | Admit: 2018-07-30 | Discharge: 2018-08-09 | DRG: 480 | Disposition: E | Payer: Medicare Other | Attending: Internal Medicine | Admitting: Internal Medicine

## 2018-07-30 DIAGNOSIS — M25552 Pain in left hip: Secondary | ICD-10-CM | POA: Diagnosis present

## 2018-07-30 DIAGNOSIS — J9811 Atelectasis: Secondary | ICD-10-CM | POA: Diagnosis not present

## 2018-07-30 DIAGNOSIS — I5033 Acute on chronic diastolic (congestive) heart failure: Secondary | ICD-10-CM | POA: Diagnosis not present

## 2018-07-30 DIAGNOSIS — I130 Hypertensive heart and chronic kidney disease with heart failure and stage 1 through stage 4 chronic kidney disease, or unspecified chronic kidney disease: Secondary | ICD-10-CM | POA: Diagnosis present

## 2018-07-30 DIAGNOSIS — N179 Acute kidney failure, unspecified: Secondary | ICD-10-CM | POA: Diagnosis not present

## 2018-07-30 DIAGNOSIS — R1310 Dysphagia, unspecified: Secondary | ICD-10-CM | POA: Diagnosis not present

## 2018-07-30 DIAGNOSIS — Z79890 Hormone replacement therapy (postmenopausal): Secondary | ICD-10-CM

## 2018-07-30 DIAGNOSIS — Z86718 Personal history of other venous thrombosis and embolism: Secondary | ICD-10-CM

## 2018-07-30 DIAGNOSIS — Z85118 Personal history of other malignant neoplasm of bronchus and lung: Secondary | ICD-10-CM

## 2018-07-30 DIAGNOSIS — J449 Chronic obstructive pulmonary disease, unspecified: Secondary | ICD-10-CM | POA: Diagnosis present

## 2018-07-30 DIAGNOSIS — S51811A Laceration without foreign body of right forearm, initial encounter: Secondary | ICD-10-CM

## 2018-07-30 DIAGNOSIS — I480 Paroxysmal atrial fibrillation: Secondary | ICD-10-CM | POA: Diagnosis present

## 2018-07-30 DIAGNOSIS — Z7901 Long term (current) use of anticoagulants: Secondary | ICD-10-CM | POA: Diagnosis not present

## 2018-07-30 DIAGNOSIS — R0902 Hypoxemia: Secondary | ICD-10-CM

## 2018-07-30 DIAGNOSIS — E1129 Type 2 diabetes mellitus with other diabetic kidney complication: Secondary | ICD-10-CM | POA: Diagnosis present

## 2018-07-30 DIAGNOSIS — Z9989 Dependence on other enabling machines and devices: Secondary | ICD-10-CM

## 2018-07-30 DIAGNOSIS — J42 Unspecified chronic bronchitis: Secondary | ICD-10-CM | POA: Diagnosis not present

## 2018-07-30 DIAGNOSIS — Z794 Long term (current) use of insulin: Secondary | ICD-10-CM

## 2018-07-30 DIAGNOSIS — Z6831 Body mass index (BMI) 31.0-31.9, adult: Secondary | ICD-10-CM

## 2018-07-30 DIAGNOSIS — Z7189 Other specified counseling: Secondary | ICD-10-CM

## 2018-07-30 DIAGNOSIS — Z419 Encounter for procedure for purposes other than remedying health state, unspecified: Secondary | ICD-10-CM

## 2018-07-30 DIAGNOSIS — J810 Acute pulmonary edema: Secondary | ICD-10-CM | POA: Diagnosis not present

## 2018-07-30 DIAGNOSIS — D500 Iron deficiency anemia secondary to blood loss (chronic): Secondary | ICD-10-CM | POA: Diagnosis present

## 2018-07-30 DIAGNOSIS — E039 Hypothyroidism, unspecified: Secondary | ICD-10-CM | POA: Diagnosis present

## 2018-07-30 DIAGNOSIS — J9611 Chronic respiratory failure with hypoxia: Secondary | ICD-10-CM | POA: Diagnosis not present

## 2018-07-30 DIAGNOSIS — S50811A Abrasion of right forearm, initial encounter: Secondary | ICD-10-CM | POA: Diagnosis present

## 2018-07-30 DIAGNOSIS — I10 Essential (primary) hypertension: Secondary | ICD-10-CM | POA: Diagnosis present

## 2018-07-30 DIAGNOSIS — E1142 Type 2 diabetes mellitus with diabetic polyneuropathy: Secondary | ICD-10-CM | POA: Diagnosis present

## 2018-07-30 DIAGNOSIS — Y92000 Kitchen of unspecified non-institutional (private) residence as  the place of occurrence of the external cause: Secondary | ICD-10-CM

## 2018-07-30 DIAGNOSIS — Z923 Personal history of irradiation: Secondary | ICD-10-CM

## 2018-07-30 DIAGNOSIS — G4733 Obstructive sleep apnea (adult) (pediatric): Secondary | ICD-10-CM | POA: Diagnosis present

## 2018-07-30 DIAGNOSIS — E785 Hyperlipidemia, unspecified: Secondary | ICD-10-CM | POA: Diagnosis present

## 2018-07-30 DIAGNOSIS — N183 Chronic kidney disease, stage 3 unspecified: Secondary | ICD-10-CM

## 2018-07-30 DIAGNOSIS — J441 Chronic obstructive pulmonary disease with (acute) exacerbation: Secondary | ICD-10-CM | POA: Diagnosis not present

## 2018-07-30 DIAGNOSIS — E1122 Type 2 diabetes mellitus with diabetic chronic kidney disease: Secondary | ICD-10-CM | POA: Diagnosis present

## 2018-07-30 DIAGNOSIS — S72002A Fracture of unspecified part of neck of left femur, initial encounter for closed fracture: Secondary | ICD-10-CM

## 2018-07-30 DIAGNOSIS — S72145A Nondisplaced intertrochanteric fracture of left femur, initial encounter for closed fracture: Secondary | ICD-10-CM | POA: Diagnosis not present

## 2018-07-30 DIAGNOSIS — Z9981 Dependence on supplemental oxygen: Secondary | ICD-10-CM | POA: Diagnosis not present

## 2018-07-30 DIAGNOSIS — W010XXA Fall on same level from slipping, tripping and stumbling without subsequent striking against object, initial encounter: Secondary | ICD-10-CM | POA: Diagnosis present

## 2018-07-30 DIAGNOSIS — Z66 Do not resuscitate: Secondary | ICD-10-CM | POA: Diagnosis not present

## 2018-07-30 DIAGNOSIS — S72142A Displaced intertrochanteric fracture of left femur, initial encounter for closed fracture: Secondary | ICD-10-CM | POA: Diagnosis present

## 2018-07-30 DIAGNOSIS — E669 Obesity, unspecified: Secondary | ICD-10-CM | POA: Diagnosis present

## 2018-07-30 DIAGNOSIS — Z87891 Personal history of nicotine dependence: Secondary | ICD-10-CM

## 2018-07-30 DIAGNOSIS — E1165 Type 2 diabetes mellitus with hyperglycemia: Secondary | ICD-10-CM | POA: Diagnosis present

## 2018-07-30 DIAGNOSIS — I959 Hypotension, unspecified: Secondary | ICD-10-CM | POA: Diagnosis not present

## 2018-07-30 DIAGNOSIS — H9190 Unspecified hearing loss, unspecified ear: Secondary | ICD-10-CM | POA: Diagnosis present

## 2018-07-30 DIAGNOSIS — N400 Enlarged prostate without lower urinary tract symptoms: Secondary | ICD-10-CM | POA: Diagnosis present

## 2018-07-30 DIAGNOSIS — C3490 Malignant neoplasm of unspecified part of unspecified bronchus or lung: Secondary | ICD-10-CM | POA: Diagnosis present

## 2018-07-30 DIAGNOSIS — J9621 Acute and chronic respiratory failure with hypoxia: Secondary | ICD-10-CM | POA: Diagnosis not present

## 2018-07-30 DIAGNOSIS — Z515 Encounter for palliative care: Secondary | ICD-10-CM

## 2018-07-30 DIAGNOSIS — Z79899 Other long term (current) drug therapy: Secondary | ICD-10-CM

## 2018-07-30 DIAGNOSIS — I5032 Chronic diastolic (congestive) heart failure: Secondary | ICD-10-CM | POA: Diagnosis not present

## 2018-07-30 DIAGNOSIS — Z9221 Personal history of antineoplastic chemotherapy: Secondary | ICD-10-CM

## 2018-07-30 DIAGNOSIS — D509 Iron deficiency anemia, unspecified: Secondary | ICD-10-CM | POA: Diagnosis present

## 2018-07-30 DIAGNOSIS — Z7952 Long term (current) use of systemic steroids: Secondary | ICD-10-CM

## 2018-07-30 DIAGNOSIS — Z91048 Other nonmedicinal substance allergy status: Secondary | ICD-10-CM

## 2018-07-30 LAB — CBC WITH DIFFERENTIAL/PLATELET
Abs Immature Granulocytes: 0.13 10*3/uL — ABNORMAL HIGH (ref 0.00–0.07)
Basophils Absolute: 0 10*3/uL (ref 0.0–0.1)
Basophils Relative: 0 %
Eosinophils Absolute: 0 10*3/uL (ref 0.0–0.5)
Eosinophils Relative: 0 %
HCT: 45.1 % (ref 39.0–52.0)
Hemoglobin: 14.1 g/dL (ref 13.0–17.0)
IMMATURE GRANULOCYTES: 1 %
Lymphocytes Relative: 9 %
Lymphs Abs: 1 10*3/uL (ref 0.7–4.0)
MCH: 29.7 pg (ref 26.0–34.0)
MCHC: 31.3 g/dL (ref 30.0–36.0)
MCV: 95.1 fL (ref 80.0–100.0)
Monocytes Absolute: 0.5 10*3/uL (ref 0.1–1.0)
Monocytes Relative: 4 %
Neutro Abs: 9.8 10*3/uL — ABNORMAL HIGH (ref 1.7–7.7)
Neutrophils Relative %: 86 %
Platelets: 185 10*3/uL (ref 150–400)
RBC: 4.74 MIL/uL (ref 4.22–5.81)
RDW: 13.9 % (ref 11.5–15.5)
WBC: 11.5 10*3/uL — ABNORMAL HIGH (ref 4.0–10.5)
nRBC: 0 % (ref 0.0–0.2)

## 2018-07-30 LAB — BASIC METABOLIC PANEL
ANION GAP: 9 (ref 5–15)
BUN: 28 mg/dL — ABNORMAL HIGH (ref 8–23)
CO2: 31 mmol/L (ref 22–32)
Calcium: 8.4 mg/dL — ABNORMAL LOW (ref 8.9–10.3)
Chloride: 96 mmol/L — ABNORMAL LOW (ref 98–111)
Creatinine, Ser: 1.42 mg/dL — ABNORMAL HIGH (ref 0.61–1.24)
GFR calc Af Amer: 49 mL/min — ABNORMAL LOW (ref >60)
GFR calc non Af Amer: 43 mL/min — ABNORMAL LOW (ref >60)
Glucose, Bld: 279 mg/dL — ABNORMAL HIGH (ref 70–99)
Potassium: 3.7 mmol/L (ref 3.5–5.1)
Sodium: 136 mmol/L (ref 135–145)

## 2018-07-30 LAB — SURGICAL PCR SCREEN
MRSA, PCR: NEGATIVE
STAPHYLOCOCCUS AUREUS: NEGATIVE

## 2018-07-30 LAB — PROTIME-INR
INR: 1.11
Prothrombin Time: 14.2 seconds (ref 11.4–15.2)

## 2018-07-30 LAB — GLUCOSE, CAPILLARY: Glucose-Capillary: 317 mg/dL — ABNORMAL HIGH (ref 70–99)

## 2018-07-30 MED ORDER — AMIODARONE HCL 200 MG PO TABS
200.0000 mg | ORAL_TABLET | ORAL | Status: DC
  Administered 2018-08-03 – 2018-08-05 (×2): 200 mg via ORAL
  Filled 2018-07-30: qty 2
  Filled 2018-07-30 (×2): qty 1
  Filled 2018-07-30 (×2): qty 2

## 2018-07-30 MED ORDER — BUDESONIDE 0.5 MG/2ML IN SUSP
0.5000 mg | Freq: Two times a day (BID) | RESPIRATORY_TRACT | Status: DC
  Administered 2018-07-30 – 2018-08-06 (×13): 0.5 mg via RESPIRATORY_TRACT
  Filled 2018-07-30 (×15): qty 2

## 2018-07-30 MED ORDER — METHOCARBAMOL 1000 MG/10ML IJ SOLN
500.0000 mg | Freq: Four times a day (QID) | INTRAVENOUS | Status: DC | PRN
  Filled 2018-07-30: qty 5

## 2018-07-30 MED ORDER — ATORVASTATIN CALCIUM 10 MG PO TABS
20.0000 mg | ORAL_TABLET | Freq: Every day | ORAL | Status: DC
  Administered 2018-07-30 – 2018-08-05 (×7): 20 mg via ORAL
  Filled 2018-07-30 (×7): qty 2

## 2018-07-30 MED ORDER — IPRATROPIUM-ALBUTEROL 0.5-2.5 (3) MG/3ML IN SOLN
3.0000 mL | Freq: Four times a day (QID) | RESPIRATORY_TRACT | Status: DC
  Administered 2018-07-30 – 2018-07-31 (×3): 3 mL via RESPIRATORY_TRACT
  Filled 2018-07-30 (×3): qty 3

## 2018-07-30 MED ORDER — GUAIFENESIN ER 600 MG PO TB12
1200.0000 mg | ORAL_TABLET | Freq: Two times a day (BID) | ORAL | Status: DC
  Administered 2018-07-30 – 2018-08-06 (×13): 1200 mg via ORAL
  Filled 2018-07-30 (×13): qty 2

## 2018-07-30 MED ORDER — FLUTICASONE-UMECLIDIN-VILANT 100-62.5-25 MCG/INH IN AEPB: 1.0000 | INHALATION_SPRAY | Freq: Every day | RESPIRATORY_TRACT | Status: DC

## 2018-07-30 MED ORDER — LEVOTHYROXINE SODIUM 25 MCG PO TABS
25.0000 ug | ORAL_TABLET | Freq: Every day | ORAL | Status: DC
  Administered 2018-08-01 – 2018-08-06 (×6): 25 ug via ORAL
  Filled 2018-07-30 (×6): qty 1

## 2018-07-30 MED ORDER — BISACODYL 10 MG RE SUPP
10.0000 mg | Freq: Every day | RECTAL | Status: DC | PRN
  Administered 2018-08-03: 10 mg via RECTAL
  Filled 2018-07-30: qty 1

## 2018-07-30 MED ORDER — ORAL CARE MOUTH RINSE
15.0000 mL | Freq: Two times a day (BID) | OROMUCOSAL | Status: DC
  Administered 2018-07-31 – 2018-08-04 (×6): 15 mL via OROMUCOSAL

## 2018-07-30 MED ORDER — ARFORMOTEROL TARTRATE 15 MCG/2ML IN NEBU
15.0000 ug | INHALATION_SOLUTION | Freq: Two times a day (BID) | RESPIRATORY_TRACT | Status: DC
  Administered 2018-07-30 – 2018-08-06 (×13): 15 ug via RESPIRATORY_TRACT
  Filled 2018-07-30 (×13): qty 2

## 2018-07-30 MED ORDER — PREDNISONE 10 MG PO TABS: 10.0000 mg | ORAL_TABLET | Freq: Every day | ORAL | Status: DC

## 2018-07-30 MED ORDER — METHOCARBAMOL 500 MG PO TABS
500.0000 mg | ORAL_TABLET | Freq: Four times a day (QID) | ORAL | Status: DC | PRN
  Administered 2018-07-30: 500 mg via ORAL
  Filled 2018-07-30: qty 1

## 2018-07-30 MED ORDER — POLYETHYLENE GLYCOL 3350 17 G PO PACK: 17.0000 g | PACK | Freq: Every day | ORAL | Status: DC | PRN

## 2018-07-30 MED ORDER — DILTIAZEM HCL ER COATED BEADS 120 MG PO CP24
120.0000 mg | ORAL_CAPSULE | Freq: Every day | ORAL | Status: DC
  Administered 2018-08-01 – 2018-08-06 (×6): 120 mg via ORAL
  Filled 2018-07-30 (×6): qty 1

## 2018-07-30 MED ORDER — MORPHINE SULFATE (PF) 2 MG/ML IV SOLN
0.5000 mg | INTRAVENOUS | Status: DC | PRN
  Administered 2018-07-31: 0.5 mg via INTRAVENOUS
  Filled 2018-07-30: qty 1

## 2018-07-30 MED ORDER — TEMAZEPAM 7.5 MG PO CAPS
15.0000 mg | ORAL_CAPSULE | Freq: Every evening | ORAL | Status: DC | PRN
  Administered 2018-07-30 – 2018-08-06 (×7): 15 mg via ORAL
  Filled 2018-07-30: qty 2
  Filled 2018-07-30 (×5): qty 1
  Filled 2018-07-30: qty 2

## 2018-07-30 MED ORDER — HYDROCODONE-ACETAMINOPHEN 5-325 MG PO TABS
1.0000 | ORAL_TABLET | Freq: Four times a day (QID) | ORAL | Status: DC | PRN
  Administered 2018-07-30 – 2018-07-31 (×2): 2 via ORAL
  Filled 2018-07-30 (×2): qty 2

## 2018-07-30 MED ORDER — DOCUSATE SODIUM 100 MG PO CAPS
100.0000 mg | ORAL_CAPSULE | Freq: Two times a day (BID) | ORAL | Status: DC
  Administered 2018-07-30: 100 mg via ORAL
  Filled 2018-07-30: qty 1

## 2018-07-30 MED ORDER — AMIODARONE HCL 100 MG PO TABS: 100.0000 mg | ORAL_TABLET | ORAL | Status: DC

## 2018-07-30 MED ORDER — CHLORHEXIDINE GLUCONATE 0.12 % MT SOLN
15.0000 mL | Freq: Two times a day (BID) | OROMUCOSAL | Status: DC
  Administered 2018-07-30 – 2018-08-06 (×12): 15 mL via OROMUCOSAL
  Filled 2018-07-30 (×13): qty 15

## 2018-07-30 MED ORDER — MORPHINE SULFATE (PF) 4 MG/ML IV SOLN
4.0000 mg | Freq: Once | INTRAVENOUS | Status: AC
  Administered 2018-07-30: 4 mg via INTRAVENOUS
  Filled 2018-07-30: qty 1

## 2018-07-30 MED ORDER — OXYBUTYNIN CHLORIDE 5 MG PO TABS
5.0000 mg | ORAL_TABLET | Freq: Every day | ORAL | Status: DC
  Administered 2018-07-30 – 2018-08-05 (×7): 5 mg via ORAL
  Filled 2018-07-30 (×7): qty 1

## 2018-07-30 MED ORDER — GLUCOSAMINE-CHONDROITIN 500-400 MG PO TABS: 1.0000 | ORAL_TABLET | Freq: Two times a day (BID) | ORAL | Status: DC

## 2018-07-30 MED ORDER — INSULIN DETEMIR 100 UNIT/ML ~~LOC~~ SOLN
12.0000 [IU] | Freq: Two times a day (BID) | SUBCUTANEOUS | Status: DC
  Administered 2018-07-30 – 2018-08-02 (×5): 12 [IU] via SUBCUTANEOUS
  Filled 2018-07-30 (×6): qty 0.12

## 2018-07-30 MED ORDER — LORATADINE 10 MG PO TABS
10.0000 mg | ORAL_TABLET | Freq: Every day | ORAL | Status: DC
  Administered 2018-08-01 – 2018-08-06 (×6): 10 mg via ORAL
  Filled 2018-07-30 (×6): qty 1

## 2018-07-30 NOTE — H&P (Signed)
TRH H&P   Patient Demographics:    [name], is a 85 y.o. male  MRN: [id number]   DOB - 10-12-1924  Admit Date - 08/06/2018  Outpatient Primary MD for the patient is Avva, Steva Ready, MD  Referring MD/NP/PA: Dr Stark Jock  Outpatient Specialists: Pulmonary Dr. Lamonte Sakai, orthopedic Dr. Hazle Nordmann  Patient coming from: Home  Chief Complaint  Patient presents with  . Fall      HPI:    [name]  is a 85 y.o. male, with past medical history significant for COPD on 6 L nasal cannula, history of right middle lobe adeno CA status post chemotherapy and radiation, diabetes mellitus, cancer free, fibrillation on amiodarone, Cardizem and Eliquis, hypertension, diastolic CHF, resents for evaluation secondary to fall, patient reports he lives at home by himself, at baseline has poor balance, reports he fell backward on his left hip, with immediate pain to left hip, he denies any head trauma, syncope or presyncope, but he does report dizziness after the fall, was able and stand, denies any other injury,. - in ED imaging were significant for left femoral neck fracture, seen at baseline around one 4.2, mild leukocytosis at 11.5, hemoglobin at baseline at 14.1, A. fib, heart rate controlled, glucose slightly elevated at 279, I was called to admit.   Review of systems:    In addition to the HPI above,  No Fever-chills, No Headache, No changes with Vision or hearing, No problems swallowing food or Liquids, No Chest pain, Cough, reports some dyspnea at baseline with no significant change No Abdominal pain, No Nausea or Vommitting, Bowel movements are regular, No Blood in stool or Urine, No dysuria, No new skin rashes or bruises, Complaints of left hip pain No new weakness, tingling, numbness in any extremity, No recent weight gain or loss, No polyuria, polydypsia or polyphagia, No significant  Mental Stressors.  A full 10 point Review of Systems was done, except as stated above, all other Review of Systems were negative.   With Past History of the following :    Past Medical History:  Diagnosis Date  . Acute diastolic CHF (congestive heart failure) ([place])   . Allergic conjunctivitis   . Allergic rhinitis   . Atrial fibrillation ([place])   . BPH (benign prostatic hyperplasia)   . Cataract   . Cellulitis    hx  . COPD (chronic obstructive pulmonary disease) ([place])       . Diabetic peripheral neuropathy ([place])   . Diverticulosis    hx  . DJD (degenerative joint disease)    Left Knee  . DVT (deep venous thrombosis) ([place])    IN RIGHT ARM 11/2010   . Hearing loss   . Hyperlipidemia   . Hypertension   . Iron deficiency anemia 03/27/2016  . Iron deficiency anemia due to chronic blood loss 03/27/2016  . Lung cancer [facility])     history of  stage IIIA non-small cell lung cancer who is currently being treated with both  Radiation and Chemotherapy  . Malabsorption of iron 03/27/2016  . Obstructive sleep apnea (adult)  10/29/2012   This patient has mild obstructive sleep apnea, tested in March 2014 at [facility] sleep. He had been a CPAP user for 14 years. AHi 10.7 He was titrated to a pressure of 9 cm water( after auto - titration). CPAP at night  --- 8-10 YR AGO....,OSA-diagnosed 2000  . Pneumonia    history  . Secondary diabetes with peripheral neuropathy ([place]) 11/07/2012      Past Surgical History:  Procedure Laterality Date  . CARDIAC CATHETERIZATION  2006  . deviated septum repair    . FIBEROPTIC BRONCHOSCOPY  12/11/2010  . Insertion of a left subclavian Port-A-Cath.  12/17/10   [place]  . PORTACATH PLACEMENT  04/23/2011   Procedure: INSERTION PORT-A-CATH;  Surgeon: Pierre Bali, MD;  Location: [place];  Service: Thoracic;;  Revision of  Porta-Cath  . VIDEO BRONCHOSCOPY  09/27/2011   Procedure: VIDEO BRONCHOSCOPY;  Surgeon: Nicanor Alcon, MD;  Location: [facility] OR;  Service:  Thoracic;  Laterality: N/A;      Social History:     Social History   Tobacco Use  . Smoking status: Former Smoker    Packs/day: 1.00    Years: 50.00    Pack years: 50.00    Types: Cigarettes    Start date: 05/18/1943    Last attempt to quit: 06/10/1992    Years since quitting: 26.1  . Smokeless tobacco: Never Used  . Tobacco comment: quit tobacco 3 years ago  Substance Use Topics  . Alcohol use: No    Alcohol/week: 0.0 standard drinks     Lives - at home  Mobility -ambulate without assistance at home, but usually does not leave home, per daughter leaves on furniture, very poor exercise tolerance   Family History :     Family History  Problem Relation Age of Onset  . Coronary artery disease Unknown   . Cancer Unknown   . Multiple sclerosis Unknown   . Diabetes type II Unknown   . Anesthesia problems Neg Hx   . Hypotension Neg Hx   . Malignant hyperthermia Neg Hx   . Pseudochol deficiency Neg Hx      Home Medications:   Prior to Admission medications   Medication Sig Start Date End Date Taking? Authorizing Provider  acetaminophen (TYLENOL) 325 MG tablet Take 650 mg by mouth every 4 (four) hours as needed for headache (chills/low grade fever).    [provider]  albuterol (PROVENTIL) (2.5 MG/3ML) 0.083% nebulizer solution Take 3 mLs (2.5 mg total) by nebulization 3 (three) times daily. 3 times daily times 5 days then every 6 hours as needed. 09/16/17   Eugenie Filler, MD  amiodarone (PACERONE) 200 MG tablet Take one tablet (200 mg) by mouth on Monday, Wednesday, Friday mornings, may also take one tablet (200 mg) as needed for palpitations 07/08/18   Jacolyn Reedy, MD  apixaban (ELIQUIS) 5 MG TABS tablet Take 1 tablet (5 mg total) by mouth 2 (two) times daily. 11/12/12   Jacolyn Reedy, MD  atorvastatin (LIPITOR) 20 MG tablet Take 20 mg by mouth at bedtime.  03/05/17   [provider]  cetirizine (ZYRTEC) 10 MG tablet Take 10 mg by mouth daily.      [provider]  diltiazem (CARDIZEM CD) 120 MG 24 hr capsule Take 120 mg by mouth daily.  [provider]  Fluticasone-Umeclidin-Vilant (TRELEGY ELLIPTA) 100-62.5-25 MCG/INH AEPB Inhale 1 puff into the lungs daily. 07/21/18   Collene Gobble, MD  furosemide (LASIX) 40 MG tablet Take 80 mg by mouth daily.     [provider]  glucosamine-chondroitin 500-400 MG tablet Take 1 tablet by mouth 2 (two) times daily with a meal.     [provider]  glucose blood (ONETOUCH VERIO) test strip  09/23/17   [provider]  guaiFENesin (MUCINEX) 600 MG 12 hr tablet Take 1 tablet (600 mg total) by mouth 2 (two) times daily as needed for cough or to loosen phlegm. 05/20/18   Debbe Odea, MD  Insulin Detemir (LEVEMIR FLEXTOUCH) 100 UNIT/ML Pen Inject 20-24 Units into the skin See admin instructions. Inject 24 units subcutaneously before breakfast, inject 20 units at bedtime, reported 10/29/17.    [provider]  insulin lispro (HUMALOG) 100 UNIT/ML injection Inject 6-7 Units into the skin 3 (three) times daily as needed for high blood sugar (CBG >150 (150-250 6 units, >250 7 units)).     [provider]  ipratropium-albuterol (DUONEB) 0.5-2.5 (3) MG/3ML SOLN Take 3 mLs by nebulization every 6 (six) hours as needed. 10/09/17   Amin, Jeanella Flattery, MD  levothyroxine (SYNTHROID, LEVOTHROID) 25 MCG tablet Take 25 mcg by mouth daily before breakfast.    [provider]  metFORMIN (GLUCOPHAGE-XR) 500 MG 24 hr tablet Take 500 mg by mouth 2 (two) times daily after a meal.  07/25/16   [provider]  oxybutynin (DITROPAN) 5 MG tablet Take 5 mg by mouth at bedtime.     [provider]  Polyvinyl Alcohol-Povidone (REFRESH OP) Place 1 drop into both eyes at bedtime.     [provider]  predniSONE (DELTASONE) 10 MG tablet Take 10 mg by mouth daily with breakfast.    [provider]  PROAIR HFA 108 (90 Base) MCG/ACT  inhaler INHALE TWO PUFFS BY MOUTH EVERY 6 HOURS AS NEEDED FOR WHEEZING AND FOR SHORTNESS OF BREATH 04/10/17   Collene Gobble, MD  temazepam (RESTORIL) 15 MG capsule TAKE ONE CAPSULE BY MOUTH AT BEDTIME AS NEEDED FOR SLEEP 02/02/14   Volanda Napoleon, MD     Allergies:    No Known Allergies   Physical Exam:   Vitals  Blood pressure 131/78, pulse 62, temperature 97.6 F (36.4 C), temperature source Oral, resp. rate 17, height 5\' 10"  (1.778 m), weight 98.4 kg, SpO2 96 %.   1. General early male, frail, in no apparent distress  2. Normal affect and insight, Not Suicidal or Homicidal, Awake Alert, Oriented X 3.  3. No F.N deficits, ALL C.Nerves Intact, Strength 5/5 all 4 extremities, Sensation intact all 4 extremities, Plantars down going.  4. Ears and Eyes appear Normal, Conjunctivae clear, PERRLA. Moist Oral Mucosa.  5. Supple Neck, No JVD, No cervical lymphadenopathy appriciated, No Carotid Bruits.  6. Symmetrical Chest wall movement, Good air movement bilaterally, scattered rales  7.  irregular irregular, No Gallops, Rubs or Murmurs, No Parasternal Heave.  8. Positive Bowel Sounds, Abdomen Soft, No tenderness, No organomegaly appriciated,No rebound -guarding or rigidity.  9.  No Cyanosis, Normal Skin Turgor, No Skin Rash or Bruise.  Lower extremity shortened  10. Good muscle tone,  joints appear normal , no effusions, Normal ROM.  11. No Palpable Lymph Nodes in Neck or Axillae    Data Review:    CBC Recent Labs  Lab 07/29/2018 1426  WBC 11.5*  HGB  14.1  HCT 45.1  PLT 185  MCV 95.1  MCH 29.7  MCHC 31.3  RDW 13.9  LYMPHSABS 1.0  MONOABS 0.5  EOSABS 0.0  BASOSABS 0.0   ------------------------------------------------------------------------------------------------------------------  Chemistries  Recent Labs  Lab 07/11/2018 1426  NA 136  K 3.7  CL 96*  CO2 31  GLUCOSE 279*  BUN 28*  CREATININE 1.42*  CALCIUM 8.4*    ------------------------------------------------------------------------------------------------------------------ estimated creatinine clearance is 39.1 mL/min (A) (by C-G formula based on SCr of 1.42 mg/dL (H)). ------------------------------------------------------------------------------------------------------------------ No results for input(s): TSH, T4TOTAL, T3FREE, THYROIDAB in the last 72 hours.  Invalid input(s): FREET3  Coagulation profile Recent Labs  Lab 07/29/2018 1426  INR 1.11   ------------------------------------------------------------------------------------------------------------------- No results for input(s): DDIMER in the last 72 hours. -------------------------------------------------------------------------------------------------------------------  Cardiac Enzymes No results for input(s): CKMB, TROPONINI, MYOGLOBIN in the last 168 hours.  Invalid input(s): CK ------------------------------------------------------------------------------------------------------------------    Component Value Date/Time   BNP 240.6 (H) 05/17/2018 1613     ---------------------------------------------------------------------------------------------------------------  Urinalysis    Component Value Date/Time   COLORURINE YELLOW 05/17/2018 1819   APPEARANCEUR CLEAR 05/17/2018 1819   LABSPEC 1.009 05/17/2018 1819   PHURINE 5.0 05/17/2018 1819   GLUCOSEU >=500 (A) 05/17/2018 1819   HGBUR NEGATIVE 05/17/2018 1819   BILIRUBINUR NEGATIVE 05/17/2018 1819   KETONESUR NEGATIVE 05/17/2018 1819   PROTEINUR NEGATIVE 05/17/2018 1819   UROBILINOGEN 0.2 12/09/2010 1650   NITRITE NEGATIVE 05/17/2018 1819   LEUKOCYTESUR NEGATIVE 05/17/2018 1819    ----------------------------------------------------------------------------------------------------------------   Imaging Results:    Dg Chest 1 View  Result Date: 07/18/2018 CLINICAL DATA:  85 year old who fell and sustained an  intertrochanteric LEFT femoral neck fracture. Preoperative evaluation. EXAM: CHEST  1 VIEW COMPARISON:  05/17/2018 and earlier. FINDINGS: AP SEMI-ERECT image was obtained. Cardiac silhouette upper normal in size to slightly enlarged, unchanged. Thoracic aorta atherosclerotic. Hilar and mediastinal contours otherwise unremarkable. LEFT subclavian Port-A-Cath with its tip projected over the LOWER SVC, unchanged; possible compression of the catheter as it courses beneath the LEFT clavicle which was not visible on prior examinations. Prominent bronchovascular markings diffusely and moderate central peribronchial thickening, unchanged. Chronic pleuroparenchymal scarring at the RIGHT lung base. No new pulmonary parenchymal abnormalities. IMPRESSION: 1. COPD. Stable borderline heart size. No acute cardiopulmonary disease. 2. Possible compression of the LEFT subclavian Port-A-Cath catheter as it courses beneath the clavicle. Electronically Signed   By: Evangeline Dakin M.D.   On: 07/26/2018 15:48   Dg Hip Unilat W Or Wo Pelvis 2-3 Views Left  Result Date: 07/14/2018 CLINICAL DATA:  85 year old who fell earlier today and injured the LEFT hip. Initial encounter. EXAM: DG HIP (WITH OR WITHOUT PELVIS) 2-3V LEFT COMPARISON:  None. FINDINGS: Acute intertrochanteric LEFT femoral neck fracture. LEFT hip joint anatomically aligned with mild joint space narrowing. Osseous demineralization. Included AP pelvis demonstrates symmetric mild joint space narrowing in the contralateral RIGHT hip. No fractures elsewhere. Sacroiliac joints and symphysis pubis anatomically aligned without diastasis and demonstrate mild degenerative changes. IMPRESSION: Acute intertrochanteric LEFT femoral neck fracture. Electronically Signed   By: Evangeline Dakin M.D.   On: 07/26/2018 15:44    My personal review of EKG: Rhythm A. fib, Rate  68 /min, QTc 452 , no Acute ST changes   Assessment & Plan:    Active Problems:   Lung cancer ([place])    Diabetic peripheral neuropathy (HCC)   COPD (chronic obstructive pulmonary disease) (HCC)   Hypertension   Hyperlipidemia   Benign prostatic hyperplasia   Paroxysmal atrial fibrillation (HCC)  Type II diabetes mellitus with renal manifestations (HCC)   Iron deficiency anemia   Chronic respiratory failure with hypoxia (HCC)   Chronic diastolic (congestive) heart failure (HCC)   Hypothyroidism   Closed left hip fracture (HCC)   Left hip fracture -Secondary to mechanical fall, he denies any syncope or presyncope preceding the fall, denies any head trauma, but he does report some dizziness after the fall -Orthopedic Dr Hazle Nordmann has been consulted by ED, the anticipated surgery on Saturday, I have discussed with daughter, patient is high risk from pulmonary point, given his significant respiratory failure, on 6 L nasal cannula at baseline, poor ambulatory status, I have consulted pulmonary for further input and assistance perioperatively, now I will keep him on incentive spirometry, scheduled duo nebs, Mucinex, continue with 6 L nasal cannula. -Eliquis  on hold in anticipation of surgery. -Pain meds and DVT prophylaxis per orthopedic team, to resume Eliquis once cleared by orthopedic,.  Chronic hypoxic respiratory failure -Baseline, on 6 L nasal cannula, continue with pulmonary toilet with incentive spirometry, scheduled duo nebs  COPD  - no active wheezing, continue with home dose prednisone, continue with home medication as scheduled and continue with duo nebs.  Paroxysmal A. fib with controlled rate -Eliquis on hold anticipation for surgery, continue with home meds including Cardizem CD and amiodarone  Hypothyroidism - cont with synthroid  Chronic diastolic congestive heart failure  -Echo on 10/2017 with ejection fraction 55 to 02% and mild diastolic dysfunction,  -[place] appears to be a euvolemic, continue with home medications  CKD stage III -Patent at baseline, continue to  monitor closely  Diabetes mellitus type II  - Hold metformin , and resume Levemir at lower dose 15 units twice daily, continue with insulin sliding scale during hospital stay   Hyperlipidemia; continue with Lipitor   DVT Prophylaxis SCDs( eliquis on HOLD)  AM Labs Ordered, also please review Full Orders  Family Communication: Admission, patients condition and plan of care including tests being ordered have been discussed with the patient and daughter who indicate understanding and agree with the plan and Code Status.  Code Status Full  Likely DC to  SNF  Condition GUARDED    Consults called: PCCM, ortho called By ED  Admission status: inpatient  Time spent in minutes : 60 minutes   Phillips Climes M.D on 07/15/2018 at 4:58 PM  Between 7am to 7pm - Pager - [phone]. After 7pm go to [email or web address] - password [facility]  [facility] - Office  [phone]

## 2018-07-30 NOTE — Progress Notes (Signed)
PHARMACIST - PHYSICIAN ORDER COMMUNICATION  CONCERNING: P&T Medication Policy on Herbal Medications  DESCRIPTION:  This patient's order for: Glucosamine  has been noted.  This product(s) is classified as an "herbal" or natural product. Due to a lack of definitive safety studies or FDA approval, nonstandard manufacturing practices, plus the potential risk of unknown drug-drug interactions while on inpatient medications, the Pharmacy and Therapeutics Committee does not permit the use of "herbal" or natural products of this type within [place].   ACTION TAKEN: The pharmacy department is unable to verify this order at this time and your patient has been informed of this safety policy. Please reevaluate patient's clinical condition at discharge and address if the herbal or natural product(s) should be resumed at that time.  

## 2018-07-30 NOTE — ED Notes (Signed)
Family member requested to speak with DR, RN informed family that the DR was on the phone at the moment but she would send him in as soon as he got done. Family member requested this EMT and RNs name. This EMT attempted to ask family member what she needed and how I could help. Family member refused my efforts and began walking to the [facility] box after she sat in front of my work station for approximately 5 minutes after being asked to wait in the room several times. Family member was informed if she walked into [facility] box she would be escorted out by security. Family member proceeded into [facility] box. Family member is in room at the moment and security will be informed if situation continues.

## 2018-07-30 NOTE — ED Triage Notes (Signed)
[facility] reports pt had unwitnessed fall. Pt has left hip pain and rotation and skin tears on arms. Pt given total of 200 mcg fentanyl. Pt on 6 lpm at home. Cbg 460, 1 L NS, 354. Noted to be not well controlled since being on prednisone in Dec.

## 2018-07-30 NOTE — ED Provider Notes (Signed)
[facility] EMERGENCY DEPARTMENT Provider Note   CSN: [id number] Arrival date & time: 08/06/2018  1356    History   Chief Complaint Chief Complaint  Patient presents with  . Fall    HPI [name] is a 85 y.o. male.     Patient is a 85 year old male with past medical history of COPD, atrial fibrillation on Eliquis, CHF, hypertension, and history of lung cancer.  He presents today for evaluation of fall.  He states he was preparing to grill chicken when he lost his balance and fell.  He is complaining of pain in his left hip.  He was unable to stand and walk after the fall.  He also has abrasions to the right forearm, but denies other injury.  The history is provided by the patient.  Fall  This is a new problem. The current episode started 1 to 2 hours ago. The problem occurs constantly. The problem has not changed since onset.Nothing aggravates the symptoms. Nothing relieves the symptoms. He has tried nothing for the symptoms.    Past Medical History:  Diagnosis Date  . Acute diastolic CHF (congestive heart failure) ([place])   . Allergic conjunctivitis   . Allergic rhinitis   . Atrial fibrillation ([place])   . BPH (benign prostatic hyperplasia)   . Cataract   . Cellulitis    hx  . COPD (chronic obstructive pulmonary disease) ([place])       . Diabetic peripheral neuropathy ([place])   . Diverticulosis    hx  . DJD (degenerative joint disease)    Left Knee  . DVT (deep venous thrombosis) ([place])    IN RIGHT ARM 11/2010   . Hearing loss   . Hyperlipidemia   . Hypertension   . Iron deficiency anemia 03/27/2016  . Iron deficiency anemia due to chronic blood loss 03/27/2016  . Lung cancer ([place])     history of stage IIIA non-small cell lung cancer who is currently being treated with both  Radiation and Chemotherapy  . Malabsorption of iron 03/27/2016  . Obstructive sleep apnea (adult)  10/29/2012   This patient has mild obstructive sleep apnea, tested in March 2014  at [facility] sleep. He had been a CPAP user for 14 years. AHi 10.7 He was titrated to a pressure of 9 cm water( after auto - titration). CPAP at night  --- 8-10 YR AGO....,OSA-diagnosed 2000  . Pneumonia    history  . Secondary diabetes with peripheral neuropathy ([place]) 11/07/2012    Patient Active Problem List   Diagnosis Date Noted  . Respiratory failure ([place]) 05/17/2018  . Thrush 01/13/2018  . Chronic diastolic (congestive) heart failure ([place]) 10/06/2017  . GERD (gastroesophageal reflux disease) 10/06/2017  . Hypothyroidism 10/06/2017  . Chronic respiratory failure with hypoxia ([place]) 09/14/2017  . Pleural effusion 07/22/2017  . Malignant neoplasm of hilus of right lung ([place]) 09/25/2016  . Iron deficiency anemia 03/27/2016  . Malabsorption of iron 03/27/2016  . Iron deficiency anemia due to chronic blood loss 03/27/2016  . Loss of weight 02/19/2016  . Type II diabetes mellitus with renal manifestations ([place]) 05/08/2015  . CAP (community acquired pneumonia) 05/07/2015  . Lactic acidosis 05/07/2015  . OSA on CPAP 01/03/2015  . Hypoxemia 12/31/2013  . Long term current use of anticoagulant therapy 11/12/2012  . History of DVT of right axillary vein 11/07/2012  . Secondary diabetes with peripheral neuropathy ([place]) 11/07/2012  . Hypertension 11/07/2012  . Hyperlipidemia 11/07/2012  . Benign prostatic hyperplasia 11/07/2012  .  Paroxysmal atrial fibrillation ([place]) 11/07/2012  . Diverticulosis   . Lung cancer ([place])   . Diabetic peripheral neuropathy ([place])   . COPD (chronic obstructive pulmonary disease) ([place])   . Allergic rhinitis     Past Surgical History:  Procedure Laterality Date  . CARDIAC CATHETERIZATION  2006  . deviated septum repair    . FIBEROPTIC BRONCHOSCOPY  12/11/2010  . Insertion of a left subclavian Port-A-Cath.  12/17/10   [place]  . PORTACATH PLACEMENT  04/23/2011   Procedure: INSERTION PORT-A-CATH;  Surgeon: Pierre Bali, MD;  Location: [facility];  Service:  Thoracic;;  Revision of  Porta-Cath  . VIDEO BRONCHOSCOPY  09/27/2011   Procedure: VIDEO BRONCHOSCOPY;  Surgeon: Nicanor Alcon, MD;  Location: [facility] OR;  Service: Thoracic;  Laterality: N/A;        Home Medications    Prior to Admission medications   Medication Sig Start Date End Date Taking? Authorizing Provider  acetaminophen (TYLENOL) 325 MG tablet Take 650 mg by mouth every 4 (four) hours as needed for headache (chills/low grade fever).    [provider]  albuterol (PROVENTIL) (2.5 MG/3ML) 0.083% nebulizer solution Take 3 mLs (2.5 mg total) by nebulization 3 (three) times daily. 3 times daily times 5 days then every 6 hours as needed. 09/16/17   Eugenie Filler, MD  amiodarone (PACERONE) 200 MG tablet Take one tablet (200 mg) by mouth on Monday, Wednesday, Friday mornings, may also take one tablet (200 mg) as needed for palpitations 07/08/18   Jacolyn Reedy, MD  apixaban (ELIQUIS) 5 MG TABS tablet Take 1 tablet (5 mg total) by mouth 2 (two) times daily. 11/12/12   Jacolyn Reedy, MD  atorvastatin (LIPITOR) 20 MG tablet Take 20 mg by mouth at bedtime.  03/05/17   [provider]  cetirizine (ZYRTEC) 10 MG tablet Take 10 mg by mouth daily.     [provider]  diltiazem (CARDIZEM CD) 120 MG 24 hr capsule Take 120 mg by mouth daily.    [provider]  Fluticasone-Umeclidin-Vilant (TRELEGY ELLIPTA) 100-62.5-25 MCG/INH AEPB Inhale 1 puff into the lungs daily. 07/21/18   Collene Gobble, MD  furosemide (LASIX) 40 MG tablet Take 80 mg by mouth daily.     [provider]  glucosamine-chondroitin 500-400 MG tablet Take 1 tablet by mouth 2 (two) times daily with a meal.     [provider]  glucose blood (ONETOUCH VERIO) test strip  09/23/17   [provider]  guaiFENesin (MUCINEX) 600 MG 12 hr tablet Take 1 tablet (600 mg total) by mouth 2 (two) times daily as needed for cough or to loosen phlegm. 05/20/18   Debbe Odea, MD    Insulin Detemir (LEVEMIR FLEXTOUCH) 100 UNIT/ML Pen Inject 20-24 Units into the skin See admin instructions. Inject 24 units subcutaneously before breakfast, inject 20 units at bedtime, reported 10/29/17.    [provider]  insulin lispro (HUMALOG) 100 UNIT/ML injection Inject 6-7 Units into the skin 3 (three) times daily as needed for high blood sugar (CBG >150 (150-250 6 units, >250 7 units)).     [provider]  ipratropium-albuterol (DUONEB) 0.5-2.5 (3) MG/3ML SOLN Take 3 mLs by nebulization every 6 (six) hours as needed. 10/09/17   Amin, Jeanella Flattery, MD  levothyroxine (SYNTHROID, LEVOTHROID) 25 MCG tablet Take 25 mcg by mouth daily before breakfast.    [provider]  metFORMIN (GLUCOPHAGE-XR) 500 MG 24 hr tablet Take 500 mg by mouth 2 (two)  times daily after a meal.  07/25/16   [provider]  oxybutynin (DITROPAN) 5 MG tablet Take 5 mg by mouth at bedtime.     [provider]  Polyvinyl Alcohol-Povidone (REFRESH OP) Place 1 drop into both eyes at bedtime.     [provider]  predniSONE (DELTASONE) 10 MG tablet Take 10 mg by mouth daily with breakfast.    [provider]  PROAIR HFA 108 (90 Base) MCG/ACT inhaler INHALE TWO PUFFS BY MOUTH EVERY 6 HOURS AS NEEDED FOR WHEEZING AND FOR SHORTNESS OF BREATH 04/10/17   Collene Gobble, MD  temazepam (RESTORIL) 15 MG capsule TAKE ONE CAPSULE BY MOUTH AT BEDTIME AS NEEDED FOR SLEEP 02/02/14   Volanda Napoleon, MD    Family History Family History  Problem Relation Age of Onset  . Coronary artery disease Unknown   . Cancer Unknown   . Multiple sclerosis Unknown   . Diabetes type II Unknown   . Anesthesia problems Neg Hx   . Hypotension Neg Hx   . Malignant hyperthermia Neg Hx   . Pseudochol deficiency Neg Hx     Social History Social History   Tobacco Use  . Smoking status: Former Smoker    Packs/day: 1.00    Years: 50.00    Pack years: 50.00    Types: Cigarettes    Start  date: 05/18/1943    Last attempt to quit: 06/10/1992    Years since quitting: 26.1  . Smokeless tobacco: Never Used  . Tobacco comment: quit tobacco 3 years ago  Substance Use Topics  . Alcohol use: No    Alcohol/week: 0.0 standard drinks  . Drug use: No     Allergies   Patient has no known allergies.   Review of Systems Review of Systems  All other systems reviewed and are negative.    Physical Exam Updated Vital Signs Temp 97.6 F (36.4 C) (Oral)   Ht 5\' 10"  (1.778 m)   Wt 98.4 kg   BMI 31.14 kg/m   Physical Exam Vitals signs and nursing note reviewed.  Constitutional:      General: He is not in acute distress.    Appearance: He is well-developed. He is not diaphoretic.  HENT:     Head: Normocephalic and atraumatic.  Neck:     Musculoskeletal: Normal range of motion and neck supple.  Cardiovascular:     Rate and Rhythm: Normal rate and regular rhythm.     Heart sounds: No murmur. No friction rub.  Pulmonary:     Effort: Pulmonary effort is normal. No respiratory distress.     Breath sounds: Normal breath sounds. No wheezing or rales.  Abdominal:     General: Bowel sounds are normal. There is no distension.     Palpations: Abdomen is soft.     Tenderness: There is no abdominal tenderness.  Musculoskeletal: Normal range of motion.     Comments: There are multiple skin tears to the right forearm.  There is tenderness to palpation over the lateral aspect of the left hip.  There is severe pain with any range of motion.  Pulses, motor, and sensory are all intact to the foot and leg.  Skin:    General: Skin is warm and dry.  Neurological:     Mental Status: He is alert and oriented to person, place, and time.     Coordination: Coordination normal.      ED Treatments / Results  Labs (all labs ordered are  listed, but only abnormal results are displayed) Labs Reviewed  BASIC METABOLIC PANEL  CBC WITH DIFFERENTIAL/PLATELET  PROTIME-INR     EKG None  Radiology No results found.  Procedures Procedures (including critical care time)  Medications Ordered in ED Medications  morphine 4 MG/ML injection 4 mg (has no administration in time range)     Initial Impression / Assessment and Plan / ED Course  I have reviewed the triage vital signs and the nursing notes.  Pertinent labs & imaging results that were available during my care of the patient were reviewed by me and considered in my medical decision making (see chart for details).  Patient with history of CHF, COPD on home oxygen.  Also with history of A. fib on Eliquis presenting with complaints of hip pain after a fall.  X-rays show a left intertrochanteric hip fracture.  Patient has abrasions/skin tears to his right forearm, but otherwise has no other complaints.  Laboratory studies unremarkable.  Has seen [facility] in the past.  Waiting to hear back from Ortho, patient to be admitted to Dr. Barton Dubois, Hospitalist.  Final Clinical Impressions(s) / ED Diagnoses   Final diagnoses:  None    ED Discharge Orders    None       Veryl Speak, MD 07/18/2018 [phone]

## 2018-07-30 NOTE — Consult Note (Signed)
NAME:  [name], MRN:  [id number], DOB:  Jul 12, 1924, LOS: 0 ADMISSION DATE:  07/28/2018, CONSULTATION DATE: July 30, 2018 REFERRING MD: Dr. Waldron Schmidt, CHIEF COMPLAINT: Fall, left hip fracture  Brief History   85 year old male with a past medical history significant for severe COPD fell today and broke his left hip.  Pulmonary was consulted for perioperative risk stratification.  History of present illness   85 year old male with severe COPD followed by my partner Dr. Lamonte Schmidt.  He also has a history of non-small cell lung cancer in the right middle lobe which was treated with chemoradiation.  He has a past medical history significant for hypertension and diastolic dysfunction and chronic effusions.  He also has chronic respiratory failure with hypoxemia.  Today he was standing in his kitchen and took a step backwards and fell and broke his hip.  He uses prednisone 10 mg daily.  He takes Trelegy at baseline.  He uses 5 to 6 L of oxygen continuously.  He tells me that his breathing has been poor lately but this is his baseline.  He has not had a recent exacerbation since December.  His cough and daily exertional dyspnea are at baseline  Past Medical History  Lung cancer COPD Chronic respiratory failure with hypoxemia Obstructive sleep apnea Iron deficiency anemia DVT BPH Atrial fibrillation  Significant Hospital Events     Consults:    Procedures:    Significant Diagnostic Tests:    Micro Data:    Antimicrobials:     Interim history/subjective:    Objective   Blood pressure 117/86, pulse 67, temperature 97.6 F (36.4 C), temperature source Oral, resp. rate 15, height 5\' 10"  (1.778 m), weight 98.4 kg, SpO2 97 %.       No intake or output data in the 24 hours ending 07/25/2018 1759 Filed Weights   07/11/2018 1403  Weight: 98.4 kg    Examination:  General:  Resting comfortably in bed HENT: NCAT OP clear PULM: Poor air movement B, normal effort CV: RRR, no  mgr GI: BS+, soft, nontender MSK: left leg externally rotated Neuro: awake, alert, no distress, MAEW   Resolved Hospital Problem list     Assessment & Plan:  Left hip fracture in a patient with severe COPD and chronic respiratory failure with hypoxemia: He is very high risk for a perioperative pulmonary complication including pneumonia, respiratory failure, or dependence on mechanical ventilation.  I explained to him today that there is at least a 10 to 20% chance that he may be ventilator dependent for a long period of time after the surgery or develop pneumonia or a respiratory complication.  That being said, I do not think there is any other good option for treating his hip and he is willing to proceed -Use CPAP at night -Use Brovana and Pulmicort twice a day -Use DuoNeb as needed for shortness of breath -Use O2 to maintain O2 saturation greater than 88% -Ambulate as soon as possible  Pulmonary will follow    Schmidt   CBC: Recent Schmidt  Lab 07/22/2018 1426  WBC 11.5*  NEUTROABS 9.8*  HGB 14.1  HCT 45.1  MCV 95.1  PLT 194    Basic Metabolic Panel: Recent Schmidt  Lab 07/25/2018 1426  NA 136  K 3.7  CL 96*  CO2 31  GLUCOSE 279*  BUN 28*  CREATININE 1.42*  CALCIUM 8.4*   GFR: Estimated Creatinine Clearance: 39.1 mL/min (A) (by C-G formula based on SCr of 1.42 mg/dL (H)). Recent  Schmidt  Lab 07/29/2018 1426  WBC 11.5*    Liver Function Tests: No results for input(s): AST, ALT, ALKPHOS, BILITOT, PROT, ALBUMIN in the last 168 hours. No results for input(s): LIPASE, AMYLASE in the last 168 hours. No results for input(s): AMMONIA in the last 168 hours.  ABG    Component Value Date/Time   PHART 7.410 05/17/2018 1742   PCO2ART 43.3 05/17/2018 1742   PO2ART 137.0 (H) 05/17/2018 1742   HCO3 27.2 05/17/2018 1742   TCO2 28 05/17/2018 1742   ACIDBASEDEF 4.0 (H) 10/06/2017 2017   O2SAT 99.0 05/17/2018 1742     Coagulation Profile: Recent Schmidt  Lab 07/23/2018 1426  INR  1.11    Cardiac Enzymes: No results for input(s): CKTOTAL, CKMB, CKMBINDEX, TROPONINI in the last 168 hours.  HbA1C: Hgb A1c MFr Bld  Date/Time Value Ref Range Status  09/16/2017 03:44 AM 8.2 (H) 4.8 - 5.6 % Final    Comment:    (NOTE) Pre diabetes:          5.7%-6.4% Diabetes:              >6.4% Glycemic control for   <7.0% adults with diabetes   05/08/2015 02:11 AM 7.0 (H) 4.8 - 5.6 % Final    Comment:    (NOTE)         Pre-diabetes: 5.7 - 6.4         Diabetes: >6.4         Glycemic control for adults with diabetes: <7.0     CBG: No results for input(s): GLUCAP in the last 168 hours.  Review of Systems:   Gen: Denies fever, chills, weight change, fatigue, night sweats HEENT: Denies blurred vision, double vision, hearing loss, tinnitus, sinus congestion, rhinorrhea, sore throat, neck stiffness, dysphagia PULM:[per HPI CV: Denies chest pain, edema, orthopnea, paroxysmal nocturnal dyspnea, palpitations GI: Denies abdominal pain, nausea, vomiting, diarrhea, hematochezia, melena, constipation, change in bowel habits GU: Denies dysuria, hematuria, polyuria, oliguria, urethral discharge Endocrine: Denies hot or cold intolerance, polyuria, polyphagia or appetite change Derm: Denies rash, dry skin, scaling or peeling skin change Heme: Denies easy bruising, bleeding, bleeding gums Neuro: Denies headache, numbness, weakness, slurred speech, loss of memory or consciousness   Past Medical History  He,  has a past medical history of Acute diastolic CHF (congestive heart failure) ([place]), Allergic conjunctivitis, Allergic rhinitis, Atrial fibrillation ([place]), BPH (benign prostatic hyperplasia), Cataract, Cellulitis, COPD (chronic obstructive pulmonary disease) ([place]), Diabetic peripheral neuropathy ([place]), Diverticulosis, DJD (degenerative joint disease), DVT (deep venous thrombosis) ([place]), Hearing loss, Hyperlipidemia, Hypertension, Iron deficiency anemia (03/27/2016), Iron deficiency  anemia due to chronic blood loss (03/27/2016), Lung cancer ([place]), Malabsorption of iron (03/27/2016), Obstructive sleep apnea (adult)  (10/29/2012), Pneumonia, and Secondary diabetes with peripheral neuropathy ([place]) (11/07/2012).   Surgical History    Past Surgical History:  Procedure Laterality Date  . CARDIAC CATHETERIZATION  2006  . deviated septum repair    . FIBEROPTIC BRONCHOSCOPY  12/11/2010  . Insertion of a left subclavian Port-A-Cath.  12/17/10   [place]  . PORTACATH PLACEMENT  04/23/2011   Procedure: INSERTION PORT-A-CATH;  Surgeon: Pierre Bali, MD;  Location: [facility];  Service: Thoracic;;  Revision of  Porta-Cath  . VIDEO BRONCHOSCOPY  09/27/2011   Procedure: VIDEO BRONCHOSCOPY;  Surgeon: Nicanor Alcon, MD;  Location: [facility];  Service: Thoracic;  Laterality: N/A;     Social History   reports that he quit smoking about 26 years ago. His smoking use included cigarettes. He  started smoking about 75 years ago. He has a 50.00 pack-year smoking history. He has never used smokeless tobacco. He reports that he does not drink alcohol or use drugs.   Family History   His family history includes Cancer in his unknown relative; Coronary artery disease in his unknown relative; Diabetes type II in his unknown relative; Multiple sclerosis in his unknown relative. There is no history of Anesthesia problems, Hypotension, Malignant hyperthermia, or Pseudochol deficiency.   Allergies Allergies  Allergen Reactions  . Tape Other (See Comments)    SKIN IS FRAGILE AND WILL TEAR EASILY!!!!     Home Medications  Prior to Admission medications   Medication Sig Start Date End Date Taking? Authorizing Provider  acetaminophen (TYLENOL) 325 MG tablet Take 650 mg by mouth every 4 (four) hours as needed for headache (chills/low grade fever).   Yes [provider]  albuterol (PROVENTIL) (2.5 MG/3ML) 0.083% nebulizer solution Take 3 mLs (2.5 mg total) by nebulization 3 (three) times daily. 3 times  daily times 5 days then every 6 hours as needed. Patient taking differently: Take 2.5 mg by nebulization See admin instructions. Nebulize 2.5 mg one to two times a day as needed for shortness of breath or wheezing 09/16/17  Yes Eugenie Filler, MD  amiodarone (PACERONE) 200 MG tablet Take one tablet (200 mg) by mouth on Monday, Wednesday, Friday mornings, may also take one tablet (200 mg) as needed for palpitations Patient taking differently: Take 200 mg by mouth See admin instructions. Take one tablet (200 mg) by mouth on Monday, Wednesday, Friday mornings, may also take one tablet (200 mg) as needed for palpitations 07/08/18  Yes Jacolyn Reedy, MD  apixaban (ELIQUIS) 5 MG TABS tablet Take 1 tablet (5 mg total) by mouth 2 (two) times daily. 11/12/12  Yes Jacolyn Reedy, MD  atorvastatin (LIPITOR) 20 MG tablet Take 20 mg by mouth at bedtime.  03/05/17  Yes [provider]  cetirizine (ZYRTEC) 10 MG tablet Take 10 mg by mouth daily.    Yes [provider]  diltiazem (CARDIZEM CD) 120 MG 24 hr capsule Take 120 mg by mouth daily.   Yes [provider]  Fluticasone-Umeclidin-Vilant (TRELEGY ELLIPTA) 100-62.5-25 MCG/INH AEPB Inhale 1 puff into the lungs daily. 07/21/18  Yes Collene Gobble, MD  furosemide (LASIX) 40 MG tablet Take 40-80 mg by mouth See admin instructions. Take 80 mg by mouth in the morning and an additional 40 mg if needed for fluid or edema   Yes [provider]  glucosamine-chondroitin 500-400 MG tablet Take 1 tablet by mouth 2 (two) times daily with a meal.    Yes [provider]  guaiFENesin (MUCINEX) 600 MG 12 hr tablet Take 1 tablet (600 mg total) by mouth 2 (two) times daily as needed for cough or to loosen phlegm. 05/20/18  Yes Debbe Odea, MD  Insulin Detemir (LEVEMIR FLEXTOUCH) 100 UNIT/ML Pen Inject 14-24 Units into the skin See admin instructions. Inject 24 units subcutaneously before breakfast and 14 units at bedtime   Yes  [provider]  insulin lispro (HUMALOG) 100 UNIT/ML injection Inject 5-7 Units into the skin 3 (three) times daily as needed for high blood sugar (BGL 150-250 = 6 units and >250 = 7 units).    Yes [provider]  ipratropium-albuterol (DUONEB) 0.5-2.5 (3) MG/3ML SOLN Take 3 mLs by nebulization every 6 (six) hours as needed. Patient taking differently: Take 3 mLs by nebulization every 6 (six) hours as needed (  for shortness of breath or wheezing).  10/09/17  Yes Amin, Jeanella Flattery, MD  levothyroxine (SYNTHROID, LEVOTHROID) 25 MCG tablet Take 25 mcg by mouth daily before breakfast.   Yes [provider]  metFORMIN (GLUCOPHAGE-XR) 500 MG 24 hr tablet Take 500 mg by mouth 2 (two) times daily after a meal.  07/25/16  Yes [provider]  oxybutynin (DITROPAN) 5 MG tablet Take 5 mg by mouth at bedtime.    Yes [provider]  Polyvinyl Alcohol-Povidone (REFRESH OP) Place 1 drop into both eyes at bedtime.    Yes [provider]  predniSONE (DELTASONE) 10 MG tablet Take 10 mg by mouth daily with breakfast.   Yes [provider]  PROAIR HFA 108 (90 Base) MCG/ACT inhaler INHALE TWO PUFFS BY MOUTH EVERY 6 HOURS AS NEEDED FOR WHEEZING AND FOR SHORTNESS OF BREATH Patient taking differently: Inhale 2 puffs into the lungs every 6 (six) hours as needed for wheezing or shortness of breath.  04/10/17  Yes Byrum, Rose Fillers, MD  temazepam (RESTORIL) 15 MG capsule TAKE ONE CAPSULE BY MOUTH AT BEDTIME AS NEEDED FOR SLEEP Patient taking differently: Take 15 mg by mouth at bedtime.  02/02/14  Yes Volanda Napoleon, MD  glucose blood (ONETOUCH VERIO) test strip  09/23/17   [provider]     Roselie Awkward, MD [facility] Pager: [phone] Cell: [phone] If no response, call [phone]

## 2018-07-31 ENCOUNTER — Inpatient Hospital Stay (HOSPITAL_COMMUNITY): Payer: Medicare Other | Admitting: Registered Nurse

## 2018-07-31 ENCOUNTER — Encounter (HOSPITAL_COMMUNITY): Admission: EM | Disposition: E | Payer: Self-pay | Source: Home / Self Care | Attending: Internal Medicine

## 2018-07-31 ENCOUNTER — Inpatient Hospital Stay (HOSPITAL_COMMUNITY): Payer: Medicare Other

## 2018-07-31 DIAGNOSIS — J441 Chronic obstructive pulmonary disease with (acute) exacerbation: Secondary | ICD-10-CM

## 2018-07-31 DIAGNOSIS — I5032 Chronic diastolic (congestive) heart failure: Secondary | ICD-10-CM

## 2018-07-31 DIAGNOSIS — S72145A Nondisplaced intertrochanteric fracture of left femur, initial encounter for closed fracture: Secondary | ICD-10-CM

## 2018-07-31 DIAGNOSIS — Z7901 Long term (current) use of anticoagulants: Secondary | ICD-10-CM

## 2018-07-31 HISTORY — PX: INTRAMEDULLARY (IM) NAIL INTERTROCHANTERIC: SHX5875

## 2018-07-31 LAB — CBC
HCT: 40.1 % (ref 39.0–52.0)
Hemoglobin: 12.8 g/dL — ABNORMAL LOW (ref 13.0–17.0)
MCH: 29.8 pg (ref 26.0–34.0)
MCHC: 31.9 g/dL (ref 30.0–36.0)
MCV: 93.5 fL (ref 80.0–100.0)
Platelets: 179 10*3/uL (ref 150–400)
RBC: 4.29 MIL/uL (ref 4.22–5.81)
RDW: 14.1 % (ref 11.5–15.5)
WBC: 11.6 10*3/uL — ABNORMAL HIGH (ref 4.0–10.5)
nRBC: 0 % (ref 0.0–0.2)

## 2018-07-31 LAB — BASIC METABOLIC PANEL
Anion gap: 10 (ref 5–15)
BUN: 24 mg/dL — ABNORMAL HIGH (ref 8–23)
CO2: 31 mmol/L (ref 22–32)
Calcium: 8.5 mg/dL — ABNORMAL LOW (ref 8.9–10.3)
Chloride: 96 mmol/L — ABNORMAL LOW (ref 98–111)
Creatinine, Ser: 1.48 mg/dL — ABNORMAL HIGH (ref 0.61–1.24)
GFR calc Af Amer: 47 mL/min — ABNORMAL LOW (ref >60)
GFR calc non Af Amer: 40 mL/min — ABNORMAL LOW (ref >60)
Glucose, Bld: 196 mg/dL — ABNORMAL HIGH (ref 70–99)
Potassium: 3.6 mmol/L (ref 3.5–5.1)
Sodium: 137 mmol/L (ref 135–145)

## 2018-07-31 LAB — GLUCOSE, CAPILLARY
Glucose-Capillary: 155 mg/dL — ABNORMAL HIGH (ref 70–99)
Glucose-Capillary: 168 mg/dL — ABNORMAL HIGH (ref 70–99)
Glucose-Capillary: 240 mg/dL — ABNORMAL HIGH (ref 70–99)
Glucose-Capillary: 324 mg/dL — ABNORMAL HIGH (ref 70–99)
Glucose-Capillary: 313 mg/dL — ABNORMAL HIGH (ref 70–99)

## 2018-07-31 SURGERY — FIXATION, FRACTURE, INTERTROCHANTERIC, WITH INTRAMEDULLARY ROD
Anesthesia: General | Site: Hip | Laterality: Left

## 2018-07-31 MED ORDER — HYDROCODONE-ACETAMINOPHEN 7.5-325 MG PO TABS
1.0000 | ORAL_TABLET | ORAL | Status: DC | PRN
  Administered 2018-08-01 – 2018-08-02 (×5): 2 via ORAL
  Administered 2018-08-03: 1 via ORAL
  Administered 2018-08-03: 2 via ORAL
  Administered 2018-08-04 (×2): 1 via ORAL
  Filled 2018-07-31: qty 1
  Filled 2018-07-31: qty 2
  Filled 2018-07-31: qty 1
  Filled 2018-07-31 (×3): qty 2
  Filled 2018-07-31: qty 1
  Filled 2018-07-31 (×2): qty 2

## 2018-07-31 MED ORDER — ROCURONIUM BROMIDE 50 MG/5ML IV SOSY
PREFILLED_SYRINGE | INTRAVENOUS | Status: AC
  Filled 2018-07-31: qty 5

## 2018-07-31 MED ORDER — OXYCODONE HCL 5 MG PO TABS: 5.0000 mg | ORAL_TABLET | Freq: Once | ORAL | Status: DC | PRN

## 2018-07-31 MED ORDER — PREDNISONE 20 MG PO TABS
20.0000 mg | ORAL_TABLET | Freq: Every day | ORAL | Status: DC
  Administered 2018-08-01: 20 mg via ORAL
  Filled 2018-07-31: qty 1

## 2018-07-31 MED ORDER — PHENYLEPHRINE 40 MCG/ML (10ML) SYRINGE FOR IV PUSH (FOR BLOOD PRESSURE SUPPORT)
PREFILLED_SYRINGE | INTRAVENOUS | Status: DC | PRN
  Administered 2018-07-31: 80 ug via INTRAVENOUS
  Administered 2018-07-31: 40 ug via INTRAVENOUS
  Administered 2018-07-31 (×2): 80 ug via INTRAVENOUS
  Administered 2018-07-31: 120 ug via INTRAVENOUS

## 2018-07-31 MED ORDER — OXYCODONE HCL 5 MG/5ML PO SOLN: 5.0000 mg | Freq: Once | ORAL | Status: DC | PRN

## 2018-07-31 MED ORDER — 0.9 % SODIUM CHLORIDE (POUR BTL) OPTIME
TOPICAL | Status: DC | PRN
  Administered 2018-07-31: 1000 mL

## 2018-07-31 MED ORDER — INSULIN ASPART 100 UNIT/ML ~~LOC~~ SOLN
0.0000 [IU] | SUBCUTANEOUS | Status: DC
  Administered 2018-07-31 (×2): 7 [IU] via SUBCUTANEOUS
  Administered 2018-07-31: 3 [IU] via SUBCUTANEOUS
  Administered 2018-08-01: 2 [IU] via SUBCUTANEOUS
  Administered 2018-08-01: 3 [IU] via SUBCUTANEOUS
  Administered 2018-08-01: 5 [IU] via SUBCUTANEOUS
  Administered 2018-08-01: 2 [IU] via SUBCUTANEOUS
  Administered 2018-08-01 – 2018-08-02 (×3): 3 [IU] via SUBCUTANEOUS
  Administered 2018-08-02: 5 [IU] via SUBCUTANEOUS
  Administered 2018-08-02: 3 [IU] via SUBCUTANEOUS
  Administered 2018-08-02: 5 [IU] via SUBCUTANEOUS
  Administered 2018-08-02 (×2): 2 [IU] via SUBCUTANEOUS
  Administered 2018-08-03 (×3): 1 [IU] via SUBCUTANEOUS
  Administered 2018-08-03: 3 [IU] via SUBCUTANEOUS
  Administered 2018-08-03: 2 [IU] via SUBCUTANEOUS
  Administered 2018-08-03: 1 [IU] via SUBCUTANEOUS
  Administered 2018-08-04: 2 [IU] via SUBCUTANEOUS
  Administered 2018-08-04: 1 [IU] via SUBCUTANEOUS
  Administered 2018-08-04: 3 [IU] via SUBCUTANEOUS
  Administered 2018-08-04 – 2018-08-05 (×3): 2 [IU] via SUBCUTANEOUS
  Administered 2018-08-05: 1 [IU] via SUBCUTANEOUS
  Administered 2018-08-05: 3 [IU] via SUBCUTANEOUS

## 2018-07-31 MED ORDER — FUROSEMIDE 40 MG PO TABS
80.0000 mg | ORAL_TABLET | Freq: Every morning | ORAL | Status: DC
  Administered 2018-07-31: 80 mg via ORAL
  Filled 2018-07-31: qty 2

## 2018-07-31 MED ORDER — FENTANYL CITRATE (PF) 100 MCG/2ML IJ SOLN: 25.0000 ug | INTRAMUSCULAR | Status: DC | PRN

## 2018-07-31 MED ORDER — FENTANYL CITRATE (PF) 250 MCG/5ML IJ SOLN
INTRAMUSCULAR | Status: AC
  Filled 2018-07-31: qty 5

## 2018-07-31 MED ORDER — LACTATED RINGERS IV SOLN
INTRAVENOUS | Status: DC | PRN
  Administered 2018-07-31: 07:00:00 via INTRAVENOUS

## 2018-07-31 MED ORDER — ONDANSETRON HCL 4 MG/2ML IJ SOLN
4.0000 mg | Freq: Four times a day (QID) | INTRAMUSCULAR | Status: DC | PRN
  Administered 2018-08-04 – 2018-08-05 (×2): 4 mg via INTRAVENOUS
  Filled 2018-07-31 (×2): qty 2

## 2018-07-31 MED ORDER — EPHEDRINE SULFATE-NACL 50-0.9 MG/10ML-% IV SOSY
PREFILLED_SYRINGE | INTRAVENOUS | Status: DC | PRN
  Administered 2018-07-31: 10 mg via INTRAVENOUS

## 2018-07-31 MED ORDER — SODIUM CHLORIDE 3 % IN NEBU
4.0000 mL | INHALATION_SOLUTION | Freq: Once | RESPIRATORY_TRACT | Status: DC
  Filled 2018-07-31: qty 4

## 2018-07-31 MED ORDER — ACETAMINOPHEN 325 MG PO TABS: 325.0000 mg | ORAL_TABLET | Freq: Four times a day (QID) | ORAL | Status: DC | PRN

## 2018-07-31 MED ORDER — APIXABAN 5 MG PO TABS
5.0000 mg | ORAL_TABLET | Freq: Two times a day (BID) | ORAL | Status: DC
  Administered 2018-07-31 – 2018-08-03 (×6): 5 mg via ORAL
  Filled 2018-07-31 (×6): qty 1

## 2018-07-31 MED ORDER — METOCLOPRAMIDE HCL 5 MG PO TABS: 5.0000 mg | ORAL_TABLET | Freq: Three times a day (TID) | ORAL | Status: DC | PRN

## 2018-07-31 MED ORDER — PHENOL 1.4 % MT LIQD: 1.0000 | OROMUCOSAL | Status: DC | PRN

## 2018-07-31 MED ORDER — SUGAMMADEX SODIUM 200 MG/2ML IV SOLN
INTRAVENOUS | Status: DC | PRN
  Administered 2018-07-31 (×2): 100 mg via INTRAVENOUS

## 2018-07-31 MED ORDER — SODIUM CHLORIDE 0.9 % IV SOLN
INTRAVENOUS | Status: DC | PRN
  Administered 2018-07-31: 30 ug/min via INTRAVENOUS

## 2018-07-31 MED ORDER — FUROSEMIDE 40 MG PO TABS
40.0000 mg | ORAL_TABLET | Freq: Every day | ORAL | Status: DC
  Administered 2018-08-01: 40 mg via ORAL
  Filled 2018-07-31: qty 1

## 2018-07-31 MED ORDER — METOCLOPRAMIDE HCL 5 MG/ML IJ SOLN: 5.0000 mg | Freq: Three times a day (TID) | INTRAMUSCULAR | Status: DC | PRN

## 2018-07-31 MED ORDER — FENTANYL CITRATE (PF) 100 MCG/2ML IJ SOLN
INTRAMUSCULAR | Status: DC | PRN
  Administered 2018-07-31: 100 ug via INTRAVENOUS

## 2018-07-31 MED ORDER — ROCURONIUM BROMIDE 50 MG/5ML IV SOSY
PREFILLED_SYRINGE | INTRAVENOUS | Status: DC | PRN
  Administered 2018-07-31: 50 mg via INTRAVENOUS

## 2018-07-31 MED ORDER — HYDROCODONE-ACETAMINOPHEN 5-325 MG PO TABS: 1.0000 | ORAL_TABLET | Freq: Four times a day (QID) | ORAL | 0 refills | Status: AC | PRN

## 2018-07-31 MED ORDER — SENNOSIDES-DOCUSATE SODIUM 8.6-50 MG PO TABS
1.0000 | ORAL_TABLET | Freq: Two times a day (BID) | ORAL | Status: DC
  Administered 2018-07-31 – 2018-08-05 (×12): 1 via ORAL
  Filled 2018-07-31 (×14): qty 1

## 2018-07-31 MED ORDER — IPRATROPIUM-ALBUTEROL 0.5-2.5 (3) MG/3ML IN SOLN: 3.0000 mL | Freq: Four times a day (QID) | RESPIRATORY_TRACT | Status: DC | PRN

## 2018-07-31 MED ORDER — ONDANSETRON HCL 4 MG/2ML IJ SOLN: 4.0000 mg | Freq: Four times a day (QID) | INTRAMUSCULAR | Status: DC | PRN

## 2018-07-31 MED ORDER — LIDOCAINE 2% (20 MG/ML) 5 ML SYRINGE
INTRAMUSCULAR | Status: AC
  Filled 2018-07-31: qty 5

## 2018-07-31 MED ORDER — GUAIFENESIN ER 600 MG PO TB12
600.0000 mg | ORAL_TABLET | Freq: Two times a day (BID) | ORAL | Status: DC | PRN
  Filled 2018-07-31: qty 1

## 2018-07-31 MED ORDER — CEFAZOLIN SODIUM-DEXTROSE 2-4 GM/100ML-% IV SOLN
2.0000 g | Freq: Four times a day (QID) | INTRAVENOUS | Status: AC
  Administered 2018-07-31 (×2): 2 g via INTRAVENOUS
  Filled 2018-07-31 (×2): qty 100

## 2018-07-31 MED ORDER — IPRATROPIUM-ALBUTEROL 0.5-2.5 (3) MG/3ML IN SOLN
3.0000 mL | Freq: Three times a day (TID) | RESPIRATORY_TRACT | Status: DC
  Administered 2018-07-31 – 2018-08-03 (×8): 3 mL via RESPIRATORY_TRACT
  Filled 2018-07-31 (×8): qty 3

## 2018-07-31 MED ORDER — KCL IN DEXTROSE-NACL 20-5-0.45 MEQ/L-%-% IV SOLN: INTRAVENOUS | Status: DC

## 2018-07-31 MED ORDER — DEXAMETHASONE SODIUM PHOSPHATE 10 MG/ML IJ SOLN
INTRAMUSCULAR | Status: AC
  Filled 2018-07-31: qty 1

## 2018-07-31 MED ORDER — MENTHOL 3 MG MT LOZG
1.0000 | LOZENGE | OROMUCOSAL | Status: DC | PRN
  Filled 2018-07-31: qty 9

## 2018-07-31 MED ORDER — CEFAZOLIN SODIUM-DEXTROSE 2-3 GM-%(50ML) IV SOLR
INTRAVENOUS | Status: DC | PRN
  Administered 2018-07-31: 2 g via INTRAVENOUS

## 2018-07-31 MED ORDER — ONDANSETRON HCL 4 MG/2ML IJ SOLN
INTRAMUSCULAR | Status: AC
  Filled 2018-07-31: qty 2

## 2018-07-31 MED ORDER — METFORMIN HCL ER 500 MG PO TB24: 500.0000 mg | ORAL_TABLET | Freq: Two times a day (BID) | ORAL | Status: DC

## 2018-07-31 MED ORDER — HYDROCODONE-ACETAMINOPHEN 5-325 MG PO TABS: 1.0000 | ORAL_TABLET | Freq: Four times a day (QID) | ORAL | 0 refills | Status: DC | PRN

## 2018-07-31 MED ORDER — PHENYLEPHRINE 40 MCG/ML (10ML) SYRINGE FOR IV PUSH (FOR BLOOD PRESSURE SUPPORT)
PREFILLED_SYRINGE | INTRAVENOUS | Status: AC
  Filled 2018-07-31: qty 10

## 2018-07-31 MED ORDER — CEFAZOLIN SODIUM-DEXTROSE 2-4 GM/100ML-% IV SOLN
INTRAVENOUS | Status: AC
  Filled 2018-07-31: qty 100

## 2018-07-31 MED ORDER — INSULIN LISPRO 100 UNIT/ML ~~LOC~~ SOLN: 5.0000 [IU] | Freq: Three times a day (TID) | SUBCUTANEOUS | Status: DC | PRN

## 2018-07-31 MED ORDER — PROPOFOL 10 MG/ML IV BOLUS
INTRAVENOUS | Status: AC
  Filled 2018-07-31: qty 20

## 2018-07-31 MED ORDER — PROPOFOL 10 MG/ML IV BOLUS
INTRAVENOUS | Status: DC | PRN
  Administered 2018-07-31: 110 mg via INTRAVENOUS

## 2018-07-31 MED ORDER — HYDROCODONE-ACETAMINOPHEN 5-325 MG PO TABS
1.0000 | ORAL_TABLET | ORAL | Status: DC | PRN
  Administered 2018-07-31 (×2): 2 via ORAL
  Administered 2018-07-31: 1 via ORAL
  Administered 2018-08-01 – 2018-08-03 (×6): 2 via ORAL
  Filled 2018-07-31 (×7): qty 2
  Filled 2018-07-31: qty 1
  Filled 2018-07-31: qty 2

## 2018-07-31 MED ORDER — ONDANSETRON HCL 4 MG/2ML IJ SOLN
INTRAMUSCULAR | Status: DC | PRN
  Administered 2018-07-31: 4 mg via INTRAVENOUS

## 2018-07-31 MED ORDER — LIDOCAINE 2% (20 MG/ML) 5 ML SYRINGE
INTRAMUSCULAR | Status: DC | PRN
  Administered 2018-07-31: 60 mg via INTRAVENOUS

## 2018-07-31 MED ORDER — DOCUSATE SODIUM 100 MG PO CAPS: 100.0000 mg | ORAL_CAPSULE | Freq: Two times a day (BID) | ORAL | Status: DC

## 2018-07-31 MED ORDER — POTASSIUM CHLORIDE CRYS ER 20 MEQ PO TBCR
40.0000 meq | EXTENDED_RELEASE_TABLET | Freq: Every day | ORAL | Status: DC
  Administered 2018-08-01: 40 meq via ORAL
  Filled 2018-07-31: qty 2

## 2018-07-31 MED ORDER — ONDANSETRON HCL 4 MG PO TABS
4.0000 mg | ORAL_TABLET | Freq: Four times a day (QID) | ORAL | Status: DC | PRN
  Administered 2018-08-03 – 2018-08-06 (×2): 4 mg via ORAL
  Filled 2018-07-31 (×2): qty 1

## 2018-07-31 MED ORDER — ALBUTEROL SULFATE HFA 108 (90 BASE) MCG/ACT IN AERS: 2.0000 | INHALATION_SPRAY | Freq: Four times a day (QID) | RESPIRATORY_TRACT | Status: DC | PRN

## 2018-07-31 MED ORDER — ALBUTEROL SULFATE (2.5 MG/3ML) 0.083% IN NEBU
2.5000 mg | INHALATION_SOLUTION | Freq: Four times a day (QID) | RESPIRATORY_TRACT | Status: DC | PRN
  Administered 2018-08-04: 2.5 mg via RESPIRATORY_TRACT
  Filled 2018-07-31: qty 3

## 2018-07-31 MED ORDER — DEXAMETHASONE SODIUM PHOSPHATE 10 MG/ML IJ SOLN
INTRAMUSCULAR | Status: DC | PRN
  Administered 2018-07-31: 4 mg via INTRAVENOUS

## 2018-07-31 SURGICAL SUPPLY — 41 items
BIT DRILL 4.3MMS DISTAL GRDTED (BIT) IMPLANT
BNDG COHESIVE 4X5 TAN STRL (GAUZE/BANDAGES/DRESSINGS) ×3 IMPLANT
BNDG GAUZE ELAST 4 BULKY (GAUZE/BANDAGES/DRESSINGS) ×3 IMPLANT
CHLORAPREP W/TINT 26ML (MISCELLANEOUS) ×3 IMPLANT
COVER PERINEAL POST (MISCELLANEOUS) ×3 IMPLANT
COVER SURGICAL LIGHT HANDLE (MISCELLANEOUS) ×3 IMPLANT
COVER WAND RF STERILE (DRAPES) ×3 IMPLANT
DRAPE INCISE IOBAN 66X45 STRL (DRAPES) ×3 IMPLANT
DRAPE STERI IOBAN 125X83 (DRAPES) ×3 IMPLANT
DRAPE SURG 17X23 STRL (DRAPES) ×12 IMPLANT
DRESSING ALLEVYN LIFE SACRUM (GAUZE/BANDAGES/DRESSINGS) ×2 IMPLANT
DRILL 4.3MMS DISTAL GRADUATED (BIT) ×3
DRSG MEPILEX BORDER 4X4 (GAUZE/BANDAGES/DRESSINGS) ×3 IMPLANT
DRSG PAD ABDOMINAL 8X10 ST (GAUZE/BANDAGES/DRESSINGS) ×3 IMPLANT
ELECT REM PT RETURN 9FT ADLT (ELECTROSURGICAL) ×3
ELECTRODE REM PT RTRN 9FT ADLT (ELECTROSURGICAL) ×1 IMPLANT
GLOVE BIO SURGEON STRL SZ7 (GLOVE) ×3 IMPLANT
GLOVE BIO SURGEON STRL SZ7.5 (GLOVE) ×3 IMPLANT
GLOVE BIOGEL PI IND STRL 8 (GLOVE) ×1 IMPLANT
GLOVE BIOGEL PI INDICATOR 8 (GLOVE) ×2
GOWN STRL REUS W/ TWL LRG LVL3 (GOWN DISPOSABLE) ×2 IMPLANT
GOWN STRL REUS W/TWL LRG LVL3 (GOWN DISPOSABLE) ×6
GUIDEWIRE BALL NOSE 100CM (WIRE) ×2 IMPLANT
HFN LH 130 DEG 11MM X 380MM (Orthopedic Implant) ×2 IMPLANT
HIP FR NAIL LAG SCREW 10.5X110 (Orthopedic Implant) ×3 IMPLANT
KIT TURNOVER KIT B (KITS) ×3 IMPLANT
LINER BOOT UNIVERSAL DISP (MISCELLANEOUS) ×3 IMPLANT
MANIFOLD NEPTUNE II (INSTRUMENTS) ×3 IMPLANT
NS IRRIG 1000ML POUR BTL (IV SOLUTION) ×3 IMPLANT
PACK GENERAL/GYN (CUSTOM PROCEDURE TRAY) ×3 IMPLANT
PAD ARMBOARD 7.5X6 YLW CONV (MISCELLANEOUS) ×6 IMPLANT
SCREW LAG HIP FR NAIL 10.5X110 (Orthopedic Implant) IMPLANT
STAPLER VISISTAT 35W (STAPLE) ×3 IMPLANT
SUT VIC AB 1 CT1 27 (SUTURE) ×3
SUT VIC AB 1 CT1 27XBRD ANBCTR (SUTURE) ×1 IMPLANT
SUT VIC AB 2-0 CT1 27 (SUTURE) ×3
SUT VIC AB 2-0 CT1 TAPERPNT 27 (SUTURE) ×1 IMPLANT
TOWEL OR 17X24 6PK STRL BLUE (TOWEL DISPOSABLE) ×3 IMPLANT
TOWEL OR 17X26 10 PK STRL BLUE (TOWEL DISPOSABLE) ×3 IMPLANT
TRAY FOLEY CATH 16FRSI W/METER (SET/KITS/TRAYS/PACK) ×2 IMPLANT
WATER STERILE IRR 1000ML POUR (IV SOLUTION) ×3 IMPLANT

## 2018-07-31 NOTE — Progress Notes (Signed)
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 -Orthopedics consulted, underwent left hip intramedullary nail 2/21   Assessment & Plan:   Left hip fracture -Secondary to mechanical fall,  -Ortho following, underwent left hip intramedullary nailing today -Eliquis resumed postop -PT OT -Will likely need rehab -Labs in a.m.  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 fib with controlled rate -Eliquis held for surgery, restarted this evening  -Heart rate controlled, continue Cardizem CD and amiodarone   Hypothyroidism - cont with synthroid  Chronicdiastolic congestive heart failure  -Echo on 10/2017 with ejection fraction 55 to 56% and mild diastolic dysfunction,  -Appears euvolemic, resume oral Lasix  CKD stage III -Patent at baseline, continue to monitor closely  Diabetes mellitus type II  - Hold metformin , and resume Levemir at lower dose 15 units twice daily, continue with insulin sliding scale  Hyperlipidemia; continue with Lipitor  DVT Prophylaxis : Eliquis is resumed  Full code Family communication: Daughter at bedside Disposition will likely  need rehab   Consults called: PCCM, ortho    Procedures:   Antimicrobials:    Subjective: -Feels okay, discomfort at the surgical site, dyspnea at baseline Objective: Vitals:   07/28/2018 1016 07/21/2018 1030 07/17/2018 1045 07/14/2018 1116  BP: 106/67 113/67 121/72 106/64  Pulse: 71 73 80 83  Resp: 20 19 20    Temp:   97.6 F (36.4 C)   TempSrc:      SpO2: 98% 99% 99% 91%  Weight:      Height:        Intake/Output Summary (Last 24 hours) at 07/24/2018 1351 Last data filed at 07/29/2018 4332 Gross per 24 hour  Intake 600 ml  Output 800 ml  Net -200 ml   Filed Weights   07/27/2018 1403  Weight: 98.4 kg    Examination:  General exam: Appears calm and comfortable, elderly frail chronically ill-appearing Respiratory system: Coarse diffuse rhonchi, no distress Cardiovascular system: S1 & S2 heard, RRR. No JVD, murmurs, rubs, gallops Gastrointestinal system: Abdomen is nondistended, soft and nontender.Normal bowel sounds heard. Central nervous system: Alert and oriented. No focal neurological deficits. Extremities: Left hip with dressing Skin: No rashes, lesions or ulcers Psychiatry: Judgement and insight appear normal. Mood & affect appropriate.     Data Reviewed:   CBC: Recent Labs  Lab 07/12/2018 1426 07/16/2018 0406  WBC 11.5* 11.6*  NEUTROABS 9.8*  --   HGB 14.1 12.8*  HCT 45.1 40.1  MCV 95.1 93.5  PLT 185 951   Basic Metabolic Panel: Recent Labs  Lab 07/19/2018 1426 08/05/2018 0406  NA 136 137  K 3.7 3.6  CL 96* 96*  CO2 31 31  GLUCOSE 279* 196*  BUN 28* 24*  CREATININE 1.42* 1.48*  CALCIUM 8.4* 8.5*  GFR: Estimated Creatinine Clearance: 37.5 mL/min (A) (by C-G formula based on SCr of 1.48 mg/dL (H)). Liver Function Tests: No results for input(s): AST, ALT, ALKPHOS, BILITOT, PROT, ALBUMIN in the last 168 hours. No results for input(s): LIPASE, AMYLASE in the last 168 hours. No results for input(s): AMMONIA in the last 168 hours. Coagulation  Profile: Recent Labs  Lab 08/01/2018 1426  INR 1.11   Cardiac Enzymes: No results for input(s): CKTOTAL, CKMB, CKMBINDEX, TROPONINI in the last 168 hours. BNP (last 3 results) No results for input(s): PROBNP in the last 8760 hours. HbA1C: No results for input(s): HGBA1C in the last 72 hours. CBG: Recent Labs  Lab 07/19/2018 2233 07/26/2018 0609 07/13/2018 0901 08/02/2018 1237  GLUCAP 317* 155* 168* 240*   Lipid Profile: No results for input(s): CHOL, HDL, LDLCALC, TRIG, CHOLHDL, LDLDIRECT in the last 72 hours. Thyroid Function Tests: No results for input(s): TSH, T4TOTAL, FREET4, T3FREE, THYROIDAB in the last 72 hours. Anemia Panel: No results for input(s): VITAMINB12, FOLATE, FERRITIN, TIBC, IRON, RETICCTPCT in the last 72 hours. Urine analysis:    Component Value Date/Time   COLORURINE YELLOW 05/17/2018 1819   APPEARANCEUR CLEAR 05/17/2018 1819   LABSPEC 1.009 05/17/2018 1819   PHURINE 5.0 05/17/2018 1819   GLUCOSEU >=500 (A) 05/17/2018 1819   HGBUR NEGATIVE 05/17/2018 1819   BILIRUBINUR NEGATIVE 05/17/2018 1819   KETONESUR NEGATIVE 05/17/2018 1819   PROTEINUR NEGATIVE 05/17/2018 1819   UROBILINOGEN 0.2 12/09/2010 1650   NITRITE NEGATIVE 05/17/2018 1819   LEUKOCYTESUR NEGATIVE 05/17/2018 1819   Sepsis Labs: @LABRCNTIP (procalcitonin:4,lacticidven:4)  ) Recent Results (from the past 240 hour(s))  Surgical pcr screen     Status: None   Collection Time: 07/29/2018  6:50 PM  Result Value Ref Range Status   MRSA, PCR NEGATIVE NEGATIVE Final   Staphylococcus aureus NEGATIVE NEGATIVE Final    Comment: (NOTE) The Xpert SA Assay (FDA approved for NASAL specimens in patients 27 years of age and older), is one component of a comprehensive surveillance program. It is not intended to diagnose infection nor to guide or monitor treatment. Performed at [facility], [place] [address] [place], [place] [address]          Radiology Studies: Dg Chest 1 View  Result Date:  07/12/2018 CLINICAL DATA:  85 year old who fell and sustained an intertrochanteric LEFT femoral neck fracture. Preoperative evaluation. EXAM: CHEST  1 VIEW COMPARISON:  05/17/2018 and earlier. FINDINGS: AP SEMI-ERECT image was obtained. Cardiac silhouette upper normal in size to slightly enlarged, unchanged. Thoracic aorta atherosclerotic. Hilar and mediastinal contours otherwise unremarkable. LEFT subclavian Port-A-Cath with its tip projected over the LOWER SVC, unchanged; possible compression of the catheter as it courses beneath the LEFT clavicle which was not visible on prior examinations. Prominent bronchovascular markings diffusely and moderate central peribronchial thickening, unchanged. Chronic pleuroparenchymal scarring at the RIGHT lung base. No new pulmonary parenchymal abnormalities. IMPRESSION: 1. COPD. Stable borderline heart size. No acute cardiopulmonary disease. 2. Possible compression of the LEFT subclavian Port-A-Cath catheter as it courses beneath the clavicle. Electronically Signed   By: Evangeline Dakin M.D.   On: 07/17/2018 15:48   Dg C-arm 1-60 Min  Result Date: 08/05/2018 CLINICAL DATA:  Intertrochanteric fracture of the left hip. EXAM: DG C-ARM 61-120 MIN; OPERATIVE LEFT HIP WITH PELVIS COMPARISON:  Radiographs 07/16/2018 FINDINGS: Multiple fluoroscopic spot images demonstrate placement of a long gamma nail with a proximal dynamic hip screw which is well positioned. Anatomic reduction of the intertrochanteric fracture. No complicating features.  IMPRESSION: Closed anatomic reduction and internal fixation of an intertrochanteric fracture of the left hip. Well position hardware without complicating features. Electronically Signed   By: Marijo Sanes M.D.   On: 07/14/2018 09:29   Dg Hip Operative Unilat W Or W/o Pelvis Left  Result Date: 07/17/2018 CLINICAL DATA:  Intertrochanteric fracture of the left hip. EXAM: DG C-ARM 61-120 MIN; OPERATIVE LEFT HIP WITH PELVIS COMPARISON:   Radiographs 07/14/2018 FINDINGS: Multiple fluoroscopic spot images demonstrate placement of a long gamma nail with a proximal dynamic hip screw which is well positioned. Anatomic reduction of the intertrochanteric fracture. No complicating features. IMPRESSION: Closed anatomic reduction and internal fixation of an intertrochanteric fracture of the left hip. Well position hardware without complicating features. Electronically Signed   By: Marijo Sanes M.D.   On: 07/26/2018 09:29   Dg Hip Unilat W Or Wo Pelvis 2-3 Views Left  Result Date: 07/13/2018 CLINICAL DATA:  85 year old who fell earlier today and injured the LEFT hip. Initial encounter. EXAM: DG HIP (WITH OR WITHOUT PELVIS) 2-3V LEFT COMPARISON:  None. FINDINGS: Acute intertrochanteric LEFT femoral neck fracture. LEFT hip joint anatomically aligned with mild joint space narrowing. Osseous demineralization. Included AP pelvis demonstrates symmetric mild joint space narrowing in the contralateral RIGHT hip. No fractures elsewhere. Sacroiliac joints and symphysis pubis anatomically aligned without diastasis and demonstrate mild degenerative changes. IMPRESSION: Acute intertrochanteric LEFT femoral neck fracture. Electronically Signed   By: Evangeline Dakin M.D.   On: 07/25/2018 15:44        Scheduled Meds: . amiodarone  200 mg Oral Q M,W,F  . apixaban  5 mg Oral BID  . arformoterol  15 mcg Nebulization BID  . atorvastatin  20 mg Oral QHS  . budesonide (PULMICORT) nebulizer solution  0.5 mg Nebulization BID  . chlorhexidine  15 mL Mouth Rinse BID  . diltiazem  120 mg Oral Daily  . docusate sodium  100 mg Oral BID  . guaiFENesin  1,200 mg Oral BID  . insulin aspart  0-9 Units Subcutaneous Q4H  . insulin detemir  12 Units Subcutaneous BID  . ipratropium-albuterol  3 mL Nebulization TID  . levothyroxine  25 mcg Oral QAC breakfast  . loratadine  10 mg Oral Daily  . mouth rinse  15 mL Mouth Rinse q12n4p  . oxybutynin  5 mg Oral QHS  .  predniSONE  10 mg Oral Q breakfast   Continuous Infusions: . ceFAZolin    .  ceFAZolin (ANCEF) IV 2 g (07/12/2018 1332)     LOS: 1 day    Time spent: 47min    Domenic Polite, MD [facility]  07/20/2018, 1:51 PM

## 2018-07-31 NOTE — Transfer of Care (Signed)
Immediate Anesthesia Transfer of Care Note  Patient: [name]  Procedure(s) Performed: INTRAMEDULLARY (IM) NAIL INTERTROCHANTRIC LEFT HIP (Left Hip)  Patient Location: PACU  Anesthesia Type:General  Level of Consciousness: awake, alert  and oriented  Airway & Oxygen Therapy: Patient Spontanous Breathing and Patient connected to face mask oxygen  Post-op Assessment: Report given to RN and Post -op Vital signs reviewed and stable  Post vital signs: Reviewed and stable  Last Vitals:  Vitals Value Taken Time  BP 122/83 07/18/2018  9:01 AM  Temp    Pulse 71 08/06/2018  9:03 AM  Resp 32 07/23/2018  9:03 AM  SpO2 99 % 07/14/2018  9:03 AM  Vitals shown include unvalidated device data.  Last Pain:  Vitals:   08/04/2018 0456  TempSrc:   PainSc: Asleep      Patients Stated Pain Goal: 3 (12/30/55 5051)  Complications: No apparent anesthesia complications

## 2018-07-31 NOTE — Progress Notes (Signed)
NAME:  [name], MRN:  [id number], DOB:  [birth year], LOS: 1 ADMISSION DATE:  07/26/2018, CONSULTATION DATE: July 30, 2018 REFERRING MD: Dr. Waldron Labs, CHIEF COMPLAINT: Fall, left hip fracture  Brief History   85 year old male with a past medical history significant for severe COPD fell today and broke his left hip.  Pulmonary was consulted for perioperative risk stratification.    Past Medical History  Lung cancer COPD Chronic respiratory failure with hypoxemia Obstructive sleep apnea Iron deficiency anemia DVT BPH Atrial fibrillation  Significant Hospital Events     Consults:    Procedures:    Significant Diagnostic Tests:    Micro Data:    Antimicrobials:     Interim history/subjective:  Feels OK Some chest congestion, mucus production  Objective   Blood pressure 106/64, pulse 83, temperature 97.6 F (36.4 C), resp. rate 20, height 5\' 10"  (1.778 m), weight 98.4 kg, SpO2 91 %.        Intake/Output Summary (Last 24 hours) at 07/29/2018 1732 Last data filed at 07/17/2018 2423 Gross per 24 hour  Intake 600 ml  Output 800 ml  Net -200 ml   Filed Weights   07/22/2018 1403  Weight: 98.4 kg    Examination:  General:  Resting comfortably in bed HENT: NCAT OP clear PULM: Rhonchi bilaterally B, normal effort CV: RRR, no mgr GI: BS+, soft, nontender MSK: normal bulk and tone Neuro: awake, alert, no distress, MAEW    Resolved Hospital Problem list     Assessment & Plan:  Left hip fracture in a patient with severe COPD and chronic respiratory failure with hypoxemia: may have mild exacerbation post operatively but in general he is doing much better than expected.  - CPAP at night - prednisone 20mg  daily x 3 days, then back to 10mg  - guaifenesin - brovana/pulmicort - duoneb now - hypertonic saline x1 - O2 to maintain O2 saturation > 90% - out of bed - flutter   Pulmonary will follow    Labs   CBC: Recent Labs  Lab 07/20/2018 1426  08/04/2018 0406  WBC 11.5* 11.6*  NEUTROABS 9.8*  --   HGB 14.1 12.8*  HCT 45.1 40.1  MCV 95.1 93.5  PLT 185 536    Basic Metabolic Panel: Recent Labs  Lab 07/19/2018 1426 08/05/2018 0406  NA 136 137  K 3.7 3.6  CL 96* 96*  CO2 31 31  GLUCOSE 279* 196*  BUN 28* 24*  CREATININE 1.42* 1.48*  CALCIUM 8.4* 8.5*   GFR: Estimated Creatinine Clearance: 37.5 mL/min (A) (by C-G formula based on SCr of 1.48 mg/dL (H)). Recent Labs  Lab 07/21/2018 1426 08/02/2018 0406  WBC 11.5* 11.6*    Liver Function Tests: No results for input(s): AST, ALT, ALKPHOS, BILITOT, PROT, ALBUMIN in the last 168 hours. No results for input(s): LIPASE, AMYLASE in the last 168 hours. No results for input(s): AMMONIA in the last 168 hours.  ABG    Component Value Date/Time   PHART 7.410 05/17/2018 1742   PCO2ART 43.3 05/17/2018 1742   PO2ART 137.0 (H) 05/17/2018 1742   HCO3 27.2 05/17/2018 1742   TCO2 28 05/17/2018 1742   ACIDBASEDEF 4.0 (H) 10/06/2017 2017   O2SAT 99.0 05/17/2018 1742     Coagulation Profile: Recent Labs  Lab 08/05/2018 1426  INR 1.11    Cardiac Enzymes: No results for input(s): CKTOTAL, CKMB, CKMBINDEX, TROPONINI in the last 168 hours.  HbA1C: Hgb A1c MFr Bld  Date/Time Value Ref Range Status  09/16/2017  03:44 AM 8.2 (H) 4.8 - 5.6 % Final    Comment:    (NOTE) Pre diabetes:          5.7%-6.4% Diabetes:              >6.4% Glycemic control for   <7.0% adults with diabetes   05/08/2015 02:11 AM 7.0 (H) 4.8 - 5.6 % Final    Comment:    (NOTE)         Pre-diabetes: 5.7 - 6.4         Diabetes: >6.4         Glycemic control for adults with diabetes: <7.0     CBG: Recent Labs  Lab 08/05/2018 2233 08/03/2018 0609 07/29/2018 0901 07/13/2018 1237 07/29/2018 1621  GLUCAP 317* 155* 168* 240* 324*    Review of Systems:   Gen: Denies fever, chills, weight change, fatigue, night sweats HEENT: Denies blurred vision, double vision, hearing loss, tinnitus, sinus congestion,  rhinorrhea, sore throat, neck stiffness, dysphagia PULM:[per HPI CV: Denies chest pain, edema, orthopnea, paroxysmal nocturnal dyspnea, palpitations GI: Denies abdominal pain, nausea, vomiting, diarrhea, hematochezia, melena, constipation, change in bowel habits GU: Denies dysuria, hematuria, polyuria, oliguria, urethral discharge Endocrine: Denies hot or cold intolerance, polyuria, polyphagia or appetite change Derm: Denies rash, dry skin, scaling or peeling skin change Heme: Denies easy bruising, bleeding, bleeding gums Neuro: Denies headache, numbness, weakness, slurred speech, loss of memory or consciousness   Past Medical History  He,  has a past medical history of Acute diastolic CHF (congestive heart failure) ([place]), Allergic conjunctivitis, Allergic rhinitis, Atrial fibrillation ([place]), BPH (benign prostatic hyperplasia), Cataract, Cellulitis, COPD (chronic obstructive pulmonary disease) ([place]), Diabetic peripheral neuropathy ([place]), Diverticulosis, DJD (degenerative joint disease), DVT (deep venous thrombosis) ([place]), Hearing loss, Hyperlipidemia, Hypertension, Iron deficiency anemia (03/27/2016), Iron deficiency anemia due to chronic blood loss (03/27/2016), Lung cancer ([place]), Malabsorption of iron (03/27/2016), Obstructive sleep apnea (adult)  (10/29/2012), Pneumonia, and Secondary diabetes with peripheral neuropathy ([place]) (11/07/2012).   Surgical History    Past Surgical History:  Procedure Laterality Date  . CARDIAC CATHETERIZATION  2006  . deviated septum repair    . FIBEROPTIC BRONCHOSCOPY  12/11/2010  . Insertion of a left subclavian Port-A-Cath.  12/17/10   [place]  . PORTACATH PLACEMENT  04/23/2011   Procedure: INSERTION PORT-A-CATH;  Surgeon: Pierre Bali, MD;  Location: [place];  Service: Thoracic;;  Revision of  Porta-Cath  . VIDEO BRONCHOSCOPY  09/27/2011   Procedure: VIDEO BRONCHOSCOPY;  Surgeon: Nicanor Alcon, MD;  Location: [place];  Service: Thoracic;  Laterality: N/A;       Social History   reports that he quit smoking about 26 years ago. His smoking use included cigarettes. He started smoking about 75 years ago. He has a 50.00 pack-year smoking history. He has never used smokeless tobacco. He reports that he does not drink alcohol or use drugs.   Family History   His family history includes Cancer in his unknown relative; Coronary artery disease in his unknown relative; Diabetes type II in his unknown relative; Multiple sclerosis in his unknown relative. There is no history of Anesthesia problems, Hypotension, Malignant hyperthermia, or Pseudochol deficiency.   Allergies Allergies  Allergen Reactions  . Tape Other (See Comments)    SKIN IS FRAGILE AND WILL TEAR EASILY!!!!     Home Medications  Prior to Admission medications   Medication Sig Start Date End Date Taking? Authorizing Provider  acetaminophen (TYLENOL) 325 MG tablet Take 650 mg by mouth every  4 (four) hours as needed for headache (chills/low grade fever).   Yes [provider]  albuterol (PROVENTIL) (2.5 MG/3ML) 0.083% nebulizer solution Take 3 mLs (2.5 mg total) by nebulization 3 (three) times daily. 3 times daily times 5 days then every 6 hours as needed. Patient taking differently: Take 2.5 mg by nebulization See admin instructions. Nebulize 2.5 mg one to two times a day as needed for shortness of breath or wheezing 09/16/17  Yes Eugenie Filler, MD  amiodarone (PACERONE) 200 MG tablet Take one tablet (200 mg) by mouth on Monday, Wednesday, Friday mornings, may also take one tablet (200 mg) as needed for palpitations Patient taking differently: Take 200 mg by mouth See admin instructions. Take one tablet (200 mg) by mouth on Monday, Wednesday, Friday mornings, may also take one tablet (200 mg) as needed for palpitations 07/08/18  Yes Jacolyn Reedy, MD  apixaban (ELIQUIS) 5 MG TABS tablet Take 1 tablet (5 mg total) by mouth 2 (two) times daily. 11/12/12  Yes Jacolyn Reedy, MD   atorvastatin (LIPITOR) 20 MG tablet Take 20 mg by mouth at bedtime.  03/05/17  Yes [provider]  cetirizine (ZYRTEC) 10 MG tablet Take 10 mg by mouth daily.    Yes [provider]  diltiazem (CARDIZEM CD) 120 MG 24 hr capsule Take 120 mg by mouth daily.   Yes [provider]  Fluticasone-Umeclidin-Vilant (TRELEGY ELLIPTA) 100-62.5-25 MCG/INH AEPB Inhale 1 puff into the lungs daily. 07/21/18  Yes Collene Gobble, MD  furosemide (LASIX) 40 MG tablet Take 40-80 mg by mouth See admin instructions. Take 80 mg by mouth in the morning and an additional 40 mg if needed for fluid or edema   Yes [provider]  glucosamine-chondroitin 500-400 MG tablet Take 1 tablet by mouth 2 (two) times daily with a meal.    Yes [provider]  guaiFENesin (MUCINEX) 600 MG 12 hr tablet Take 1 tablet (600 mg total) by mouth 2 (two) times daily as needed for cough or to loosen phlegm. 05/20/18  Yes Debbe Odea, MD  Insulin Detemir (LEVEMIR FLEXTOUCH) 100 UNIT/ML Pen Inject 14-24 Units into the skin See admin instructions. Inject 24 units subcutaneously before breakfast and 14 units at bedtime   Yes [provider]  insulin lispro (HUMALOG) 100 UNIT/ML injection Inject 5-7 Units into the skin 3 (three) times daily as needed for high blood sugar (BGL 150-250 = 6 units and >250 = 7 units).    Yes [provider]  ipratropium-albuterol (DUONEB) 0.5-2.5 (3) MG/3ML SOLN Take 3 mLs by nebulization every 6 (six) hours as needed. Patient taking differently: Take 3 mLs by nebulization every 6 (six) hours as needed (for shortness of breath or wheezing).  10/09/17  Yes Amin, Jeanella Flattery, MD  levothyroxine (SYNTHROID, LEVOTHROID) 25 MCG tablet Take 25 mcg by mouth daily before breakfast.   Yes [provider]  metFORMIN (GLUCOPHAGE-XR) 500 MG 24 hr tablet Take 500 mg by mouth 2 (two) times daily after a meal.  07/25/16  Yes [provider]  oxybutynin  (DITROPAN) 5 MG tablet Take 5 mg by mouth at bedtime.    Yes [provider]  Polyvinyl Alcohol-Povidone (REFRESH OP) Place 1 drop into both eyes at bedtime.    Yes [provider]  predniSONE (DELTASONE) 10 MG tablet Take 10 mg by mouth daily with breakfast.   Yes [provider]  PROAIR HFA 108 (90 Base) MCG/ACT inhaler INHALE TWO PUFFS BY MOUTH  EVERY 6 HOURS AS NEEDED FOR WHEEZING AND FOR SHORTNESS OF BREATH Patient taking differently: Inhale 2 puffs into the lungs every 6 (six) hours as needed for wheezing or shortness of breath.  04/10/17  Yes Byrum, Rose Fillers, MD  temazepam (RESTORIL) 15 MG capsule TAKE ONE CAPSULE BY MOUTH AT BEDTIME AS NEEDED FOR SLEEP Patient taking differently: Take 15 mg by mouth at bedtime.  02/02/14  Yes Volanda Napoleon, MD  glucose blood (ONETOUCH VERIO) test strip  09/23/17   [provider]     Roselie Awkward, MD [facility] Pager: [phone] Cell: [phone] If no response, call [phone]

## 2018-07-31 NOTE — Op Note (Signed)
Procedure(s): INTRAMEDULLARY (IM) NAIL INTERTROCHANTRIC LEFT HIP Procedure Note  ROWEN WILMER male 85 y.o. 07/18/2018  Preoperative diagnosis: Left intertrochanteric hip fracture   Postoperative diagnosis: Same  Procedure(s) and Anesthesia Type:    * INTRAMEDULLARY (IM) NAIL INTERTROCHANTRIC LEFT HIP - General  Surgeon(s) and Role:    Tania Ade, MD - Primary   Indications:  85 y.o. male s/p fall with left hip fracture. Indicated for surgery to promote early ambulation, pain control and prevent complications of bed rest.     Surgeon: Isabella Stalling   Assistants: Joanell Rising, PA-C Randall Hiss was scrubbed and present throughout the procedure and necessary for assistance with positioning retraction and closure.)  Anesthesia: General endotracheal anesthesia    Procedure Detail  INTRAMEDULLARY (IM) NAIL INTERTROCHANTRIC LEFT HIP  Findings: Biomet affixus 11 x 380 mm nail with near anatomic alignment.  Estimated Blood Loss:  200 mL         Drains: none  Blood Given: none          Specimens: none        Complications:  * No complications entered in OR log *         Disposition: PACU - hemodynamically stable.         Condition: stable    Procedure:  The patient was identified in the preoperative holding area  where I personally marked the operative site after verifying site, side,  and procedure with the patient. She was taken back to the operating  room where general anesthesia was induced without complication. She was  placed on the fracture table with the left lower extremity in traction,  and opposite lower extremity in a flexed abducted position. The arms were well  padded. Fluoroscopic imaging was used to verify reduction with gentle traction  and internal rotation. The left hip was then prepped and draped in the standard sterile fashion. An approximately 3 cm incision was made proximal  to the palpable greater trochanter tip. Dissection was carried  down to  the tip and the short guidewire was placed under fluoroscopic imaging.  The proximal entry reamer was used to open the canal and the nail was then advanced down the femoral canal.  Sizing was done with fluoroscopy prior to prepping and draping.   The proximal jig was  then placed and a small 1.5 cm incision was made on the lateral thigh to  advance the lag screw guide against the lateral aspect of the femur.  The guide pin was advanced and its position was verified in AP and  lateral planes to be centered in the head.  The guidewire was over  reamed and the appropriate size lag screw was advanced. The  proximal set screw was then advanced.  AP and lateral imaging demonstrated appropriate position of  the screw and reduction of the fracture. The proximal jig was then  removed.   Final fluoroscopic imaging in AP and lateral planes at the hip and the  knee demonstrated near anatomic reduction with appropriate length and  position of the hardware. All wounds were then copiously irrigated with  normal saline and subsequently closed in layers with #1 Vicryl in a deep  fascia layer, 2-0 Vicryl in a deep dermal layer, and staples for skin  closure. Sterile dressings were then applied including 4x4s and Mepilex  dressings. The patient was then taken off the fracture table,  transferred to the stretcher, and taken to the recovery room in stable  condition after she  was extubated.   POSTOPERATIVE PLAN: He will be weightbearing as tolerated on the operative extremity. He will have DVT prophylaxis of Eliquis which will be restarted this evening.

## 2018-07-31 NOTE — Plan of Care (Signed)

## 2018-07-31 NOTE — Plan of Care (Signed)

## 2018-07-31 NOTE — Anesthesia Postprocedure Evaluation (Signed)
Anesthesia Post Note  Patient: [name]  Procedure(s) Performed: INTRAMEDULLARY (IM) NAIL INTERTROCHANTRIC LEFT HIP (Left Hip)     Patient location during evaluation: PACU Anesthesia Type: General Level of consciousness: awake and alert Pain management: pain level controlled Vital Signs Assessment: post-procedure vital signs reviewed and stable Respiratory status: spontaneous breathing, nonlabored ventilation, respiratory function stable and patient connected to nasal cannula oxygen Cardiovascular status: blood pressure returned to baseline and stable Postop Assessment: no apparent nausea or vomiting Anesthetic complications: no    Last Vitals:  Vitals:   08/06/2018 0901 08/01/2018 0916  BP: 122/83 111/73  Pulse: 71 71  Resp:  15  Temp:  36.4 C  SpO2: 100% 98%    Last Pain:  Vitals:   07/14/2018 0916  TempSrc:   PainSc: Victoria Vera

## 2018-07-31 NOTE — Anesthesia Preprocedure Evaluation (Signed)
Anesthesia Evaluation  Patient identified by MRN, date of birth, ID band Patient awake    Reviewed: Allergy & Precautions, H&P , NPO status , Patient's Chart, lab work & pertinent test results  Airway Mallampati: II   Neck ROM: full    Dental   Pulmonary sleep apnea , COPD, former smoker,  H/o lung CA s/p radiation and chemo   breath sounds clear to auscultation       Cardiovascular hypertension, +CHF and + DVT  + dysrhythmias Atrial Fibrillation  Rhythm:regular Rate:Normal     Neuro/Psych  Neuromuscular disease    GI/Hepatic GERD  ,  Endo/Other  diabetes, Type 2Hypothyroidism   Renal/GU Renal InsufficiencyRenal disease     Musculoskeletal  (+) Arthritis ,   Abdominal   Peds  Hematology   Anesthesia Other Findings   Reproductive/Obstetrics                             Anesthesia Physical Anesthesia Plan  ASA: III  Anesthesia Plan: General   Post-op Pain Management:    Induction: Intravenous  PONV Risk Score and Plan: 2 and Ondansetron, Dexamethasone and Treatment may vary due to age or medical condition  Airway Management Planned: Oral ETT  Additional Equipment:   Intra-op Plan:   Post-operative Plan: Extubation in OR  Informed Consent: I have reviewed the patients History and Physical, chart, labs and discussed the procedure including the risks, benefits and alternatives for the proposed anesthesia with the patient or authorized representative who has indicated his/her understanding and acceptance.       Plan Discussed with: CRNA, Anesthesiologist and Surgeon  Anesthesia Plan Comments:         Anesthesia Quick Evaluation

## 2018-07-31 NOTE — H&P (Signed)
[name] is an 85 y.o. male.   Chief Complaint: L hip fracture  HPI: S/p fall at home yesterday with L IT hip fracture. Pain with any movement.  Denies other injuries.    Past Medical History:  Diagnosis Date  . Acute diastolic CHF (congestive heart failure) ([place])   . Allergic conjunctivitis   . Allergic rhinitis   . Atrial fibrillation ([place])   . BPH (benign prostatic hyperplasia)   . Cataract   . Cellulitis    hx  . COPD (chronic obstructive pulmonary disease) ([place])       . Diabetic peripheral neuropathy ([place])   . Diverticulosis    hx  . DJD (degenerative joint disease)    Left Knee  . DVT (deep venous thrombosis) ([place])    IN RIGHT ARM 11/2010   . Hearing loss   . Hyperlipidemia   . Hypertension   . Iron deficiency anemia 03/27/2016  . Iron deficiency anemia due to chronic blood loss 03/27/2016  . Lung cancer ([place])     history of stage IIIA non-small cell lung cancer who is currently being treated with both  Radiation and Chemotherapy  . Malabsorption of iron 03/27/2016  . Obstructive sleep apnea (adult)  10/29/2012   This patient has mild obstructive sleep apnea, tested in March 2014 at [facility] sleep. He had been a CPAP user for 14 years. AHi 10.7 He was titrated to a pressure of 9 cm water( after auto - titration). CPAP at night  --- 8-10 YR AGO....,OSA-diagnosed 2000  . Pneumonia    history  . Secondary diabetes with peripheral neuropathy ([place]) 11/07/2012    Past Surgical History:  Procedure Laterality Date  . CARDIAC CATHETERIZATION  2006  . deviated septum repair    . FIBEROPTIC BRONCHOSCOPY  12/11/2010  . Insertion of a left subclavian Port-A-Cath.  12/17/10   [place]  . PORTACATH PLACEMENT  04/23/2011   Procedure: INSERTION PORT-A-CATH;  Surgeon: Pierre Bali, MD;  Location: [facility];  Service: Thoracic;;  Revision of  Porta-Cath  . VIDEO BRONCHOSCOPY  09/27/2011   Procedure: VIDEO BRONCHOSCOPY;  Surgeon: Nicanor Alcon, MD;  Location: [facility] OR;  Service:  Thoracic;  Laterality: N/A;    Family History  Problem Relation Age of Onset  . Coronary artery disease Unknown   . Cancer Unknown   . Multiple sclerosis Unknown   . Diabetes type II Unknown   . Anesthesia problems Neg Hx   . Hypotension Neg Hx   . Malignant hyperthermia Neg Hx   . Pseudochol deficiency Neg Hx    Social History:  reports that he quit smoking about 26 years ago. His smoking use included cigarettes. He started smoking about 75 years ago. He has a 50.00 pack-year smoking history. He has never used smokeless tobacco. He reports that he does not drink alcohol or use drugs.  Allergies:  Allergies  Allergen Reactions  . Tape Other (See Comments)    SKIN IS FRAGILE AND WILL TEAR EASILY!!!!    Medications Prior to Admission  Medication Sig Dispense Refill  . acetaminophen (TYLENOL) 325 MG tablet Take 650 mg by mouth every 4 (four) hours as needed for headache (chills/low grade fever).    Marland Kitchen albuterol (PROVENTIL) (2.5 MG/3ML) 0.083% nebulizer solution Take 3 mLs (2.5 mg total) by nebulization 3 (three) times daily. 3 times daily times 5 days then every 6 hours as needed. (Patient taking differently: Take 2.5 mg by nebulization See admin instructions. Nebulize 2.5 mg one to  two times a day as needed for shortness of breath or wheezing) 360 mL 0  . amiodarone (PACERONE) 200 MG tablet Take one tablet (200 mg) by mouth on Monday, Wednesday, Friday mornings, may also take one tablet (200 mg) as needed for palpitations (Patient taking differently: Take 200 mg by mouth See admin instructions. Take one tablet (200 mg) by mouth on Monday, Wednesday, Friday mornings, may also take one tablet (200 mg) as needed for palpitations) 45 tablet 3  . apixaban (ELIQUIS) 5 MG TABS tablet Take 1 tablet (5 mg total) by mouth 2 (two) times daily. 60 tablet 12  . atorvastatin (LIPITOR) 20 MG tablet Take 20 mg by mouth at bedtime.     . cetirizine (ZYRTEC) 10 MG tablet Take 10 mg by mouth daily.     Marland Kitchen  diltiazem (CARDIZEM CD) 120 MG 24 hr capsule Take 120 mg by mouth daily.    . Fluticasone-Umeclidin-Vilant (TRELEGY ELLIPTA) 100-62.5-25 MCG/INH AEPB Inhale 1 puff into the lungs daily. 180 each 3  . furosemide (LASIX) 40 MG tablet Take 40-80 mg by mouth See admin instructions. Take 80 mg by mouth in the morning and an additional 40 mg if needed for fluid or edema    . glucosamine-chondroitin 500-400 MG tablet Take 1 tablet by mouth 2 (two) times daily with a meal.     . guaiFENesin (MUCINEX) 600 MG 12 hr tablet Take 1 tablet (600 mg total) by mouth 2 (two) times daily as needed for cough or to loosen phlegm. 60 tablet 0  . Insulin Detemir (LEVEMIR FLEXTOUCH) 100 UNIT/ML Pen Inject 14-24 Units into the skin See admin instructions. Inject 24 units subcutaneously before breakfast and 14 units at bedtime    . insulin lispro (HUMALOG) 100 UNIT/ML injection Inject 5-7 Units into the skin 3 (three) times daily as needed for high blood sugar (BGL 150-250 = 6 units and >250 = 7 units).     Marland Kitchen ipratropium-albuterol (DUONEB) 0.5-2.5 (3) MG/3ML SOLN Take 3 mLs by nebulization every 6 (six) hours as needed. (Patient taking differently: Take 3 mLs by nebulization every 6 (six) hours as needed (for shortness of breath or wheezing). ) 360 mL 1  . levothyroxine (SYNTHROID, LEVOTHROID) 25 MCG tablet Take 25 mcg by mouth daily before breakfast.    . metFORMIN (GLUCOPHAGE-XR) 500 MG 24 hr tablet Take 500 mg by mouth 2 (two) times daily after a meal.     . oxybutynin (DITROPAN) 5 MG tablet Take 5 mg by mouth at bedtime.     . Polyvinyl Alcohol-Povidone (REFRESH OP) Place 1 drop into both eyes at bedtime.     . predniSONE (DELTASONE) 10 MG tablet Take 10 mg by mouth daily with breakfast.    . PROAIR HFA 108 (90 Base) MCG/ACT inhaler INHALE TWO PUFFS BY MOUTH EVERY 6 HOURS AS NEEDED FOR WHEEZING AND FOR SHORTNESS OF BREATH (Patient taking differently: Inhale 2 puffs into the lungs every 6 (six) hours as needed for wheezing or  shortness of breath. ) 9 each 5  . temazepam (RESTORIL) 15 MG capsule TAKE ONE CAPSULE BY MOUTH AT BEDTIME AS NEEDED FOR SLEEP (Patient taking differently: Take 15 mg by mouth at bedtime. ) 30 capsule 0  . glucose blood (ONETOUCH VERIO) test strip       Results for orders placed or performed during the hospital encounter of 07/12/2018 (from the past 48 hour(s))  Basic metabolic panel     Status: Abnormal   Collection Time: 07/29/2018  2:26  PM  Result Value Ref Range   Sodium 136 135 - 145 mmol/L   Potassium 3.7 3.5 - 5.1 mmol/L   Chloride 96 (L) 98 - 111 mmol/L   CO2 31 22 - 32 mmol/L   Glucose, Bld 279 (H) 70 - 99 mg/dL   BUN 28 (H) 8 - 23 mg/dL   Creatinine, Ser 1.42 (H) 0.61 - 1.24 mg/dL   Calcium 8.4 (L) 8.9 - 10.3 mg/dL   GFR calc non Af Amer 43 (L) >60 mL/min   GFR calc Af Amer 49 (L) >60 mL/min   Anion gap 9 5 - 15    Comment: Performed at [facility] [address], [place], [place] [address]  CBC with Differential     Status: Abnormal   Collection Time: 07/16/2018  2:26 PM  Result Value Ref Range   WBC 11.5 (H) 4.0 - 10.5 K/uL   RBC 4.74 4.22 - 5.81 MIL/uL   Hemoglobin 14.1 13.0 - 17.0 g/dL   HCT 45.1 39.0 - 52.0 %   MCV 95.1 80.0 - 100.0 fL   MCH 29.7 26.0 - 34.0 pg   MCHC 31.3 30.0 - 36.0 g/dL   RDW 13.9 11.5 - 15.5 %   Platelets 185 150 - 400 K/uL   nRBC 0.0 0.0 - 0.2 %   Neutrophils Relative % 86 %   Neutro Abs 9.8 (H) 1.7 - 7.7 K/uL   Lymphocytes Relative 9 %   Lymphs Abs 1.0 0.7 - 4.0 K/uL   Monocytes Relative 4 %   Monocytes Absolute 0.5 0.1 - 1.0 K/uL   Eosinophils Relative 0 %   Eosinophils Absolute 0.0 0.0 - 0.5 K/uL   Basophils Relative 0 %   Basophils Absolute 0.0 0.0 - 0.1 K/uL   Immature Granulocytes 1 %   Abs Immature Granulocytes 0.13 (H) 0.00 - 0.07 K/uL    Comment: Performed at [facility] [address], [place], [place] [address]  Protime-INR     Status: None   Collection Time: 07/12/2018  2:26 PM  Result Value Ref Range    Prothrombin Time 14.2 11.4 - 15.2 seconds   INR 1.11     Comment: Performed at [facility] [address], [place], [place] [address]  Surgical pcr screen     Status: None   Collection Time: 08/05/2018  6:50 PM  Result Value Ref Range   MRSA, PCR NEGATIVE NEGATIVE   Staphylococcus aureus NEGATIVE NEGATIVE    Comment: (NOTE) The Xpert SA Assay (FDA approved for NASAL specimens in patients 67 years of age and older), is one component of a comprehensive surveillance program. It is not intended to diagnose infection nor to guide or monitor treatment. Performed at [facility], [place] [address], [place], [place] [address]   Glucose, capillary     Status: Abnormal   Collection Time: 08/01/2018 10:33 PM  Result Value Ref Range   Glucose-Capillary 317 (H) 70 - 99 mg/dL  CBC     Status: Abnormal   Collection Time: 07/16/2018  4:06 AM  Result Value Ref Range   WBC 11.6 (H) 4.0 - 10.5 K/uL   RBC 4.29 4.22 - 5.81 MIL/uL   Hemoglobin 12.8 (L) 13.0 - 17.0 g/dL   HCT 40.1 39.0 - 52.0 %   MCV 93.5 80.0 - 100.0 fL   MCH 29.8 26.0 - 34.0 pg   MCHC 31.9 30.0 - 36.0 g/dL   RDW 14.1 11.5 - 15.5 %  Platelets 179 150 - 400 K/uL   nRBC 0.0 0.0 - 0.2 %    Comment: Performed at [facility], [place] [address] [place], [place] [address]  Basic metabolic panel     Status: Abnormal   Collection Time: 07/24/2018  4:06 AM  Result Value Ref Range   Sodium 137 135 - 145 mmol/L   Potassium 3.6 3.5 - 5.1 mmol/L   Chloride 96 (L) 98 - 111 mmol/L   CO2 31 22 - 32 mmol/L   Glucose, Bld 196 (H) 70 - 99 mg/dL   BUN 24 (H) 8 - 23 mg/dL   Creatinine, Ser 1.48 (H) 0.61 - 1.24 mg/dL   Calcium 8.5 (L) 8.9 - 10.3 mg/dL   GFR calc non Af Amer 40 (L) >60 mL/min   GFR calc Af Amer 47 (L) >60 mL/min   Anion gap 10 5 - 15    Comment: Performed at [facility] [address], [place], [place] [address]   Dg Chest 1 View  Result Date: 07/13/2018 CLINICAL DATA:  85 year old who fell and sustained an  intertrochanteric LEFT femoral neck fracture. Preoperative evaluation. EXAM: CHEST  1 VIEW COMPARISON:  05/17/2018 and earlier. FINDINGS: AP SEMI-ERECT image was obtained. Cardiac silhouette upper normal in size to slightly enlarged, unchanged. Thoracic aorta atherosclerotic. Hilar and mediastinal contours otherwise unremarkable. LEFT subclavian Port-A-Cath with its tip projected over the LOWER SVC, unchanged; possible compression of the catheter as it courses beneath the LEFT clavicle which was not visible on prior examinations. Prominent bronchovascular markings diffusely and moderate central peribronchial thickening, unchanged. Chronic pleuroparenchymal scarring at the RIGHT lung base. No new pulmonary parenchymal abnormalities. IMPRESSION: 1. COPD. Stable borderline heart size. No acute cardiopulmonary disease. 2. Possible compression of the LEFT subclavian Port-A-Cath catheter as it courses beneath the clavicle. Electronically Signed   By: Evangeline Dakin M.D.   On: 07/15/2018 15:48   Dg Hip Unilat W Or Wo Pelvis 2-3 Views Left  Result Date: 07/12/2018 CLINICAL DATA:  85 year old who fell earlier today and injured the LEFT hip. Initial encounter. EXAM: DG HIP (WITH OR WITHOUT PELVIS) 2-3V LEFT COMPARISON:  None. FINDINGS: Acute intertrochanteric LEFT femoral neck fracture. LEFT hip joint anatomically aligned with mild joint space narrowing. Osseous demineralization. Included AP pelvis demonstrates symmetric mild joint space narrowing in the contralateral RIGHT hip. No fractures elsewhere. Sacroiliac joints and symphysis pubis anatomically aligned without diastasis and demonstrate mild degenerative changes. IMPRESSION: Acute intertrochanteric LEFT femoral neck fracture. Electronically Signed   By: Evangeline Dakin M.D.   On: 08/05/2018 15:44    Review of Systems  All other systems reviewed and are negative.   Blood pressure 106/72, pulse 72, temperature 98 F (36.7 C), temperature source Oral, resp.  rate 20, height 5\' 10"  (1.778 m), weight 98.4 kg, SpO2 97 %. Physical Exam  Constitutional: He is oriented to person, place, and time. He appears well-developed and well-nourished.  HENT:  Head: Atraumatic.  Eyes: EOM are normal.  Cardiovascular: Intact distal pulses.  Respiratory: Effort normal.  Musculoskeletal:     Comments: LLE shortened and ext rotated. Distally NVI.  Neurological: He is alert and oriented to person, place, and time.  Skin: Skin is warm and dry.  Psychiatric: He has a normal mood and affect.     Assessment/Plan S/p fall at home yesterday with L IT hip fracture. Pain with any movement.  Denies other injuries.   Plan L hip troch nail to promote ambulation and pain relief Risks /  benefits of surgery discussed Consent on chart  NPO for OR Preop antibiotics   Isabella Stalling, MD 08/05/2018, 7:14 AM

## 2018-07-31 NOTE — Progress Notes (Signed)
Orthopedic Tech Progress Note Patient Details:  [name] 1924-11-09 [id number]  Patient ID: GEARALD STONEBRAKER, male   DOB: [birth year], 85 y.o.   MRN: [id number]  Applied ohf  Karolee Stamps 07/17/2018, 1:44 PM

## 2018-07-31 NOTE — Anesthesia Procedure Notes (Signed)
Procedure Name: Intubation Date/Time: 07/11/2018 7:39 AM Performed by: Trinna Post., CRNA Pre-anesthesia Checklist: Emergency Drugs available, Patient identified, Suction available, Patient being monitored and Timeout performed Patient Re-evaluated:Patient Re-evaluated prior to induction Preoxygenation: Pre-oxygenation with 100% oxygen Induction Type: IV induction Ventilation: Oral airway inserted - appropriate to patient size Laryngoscope Size: Mac and 4 Grade View: Grade III Tube type: Oral Tube size: 7.5 mm Number of attempts: 2 Airway Equipment and Method: Stylet Placement Confirmation: ETT inserted through vocal cords under direct vision,  positive ETCO2 and breath sounds checked- equal and bilateral Secured at: 23 cm Tube secured with: Tape Dental Injury: Teeth and Oropharynx as per pre-operative assessment

## 2018-07-31 NOTE — Progress Notes (Addendum)
PACU RN removed all old dressings, band aids, tape, and mepelexes from both patient's arms with skin tears.  The band aids, tape, and mepelexes proceeded to remove & pull torn skin upon removal due to stickiness.  RN replaced with vaseline gauze, abd pads, kerlex, and tape adhered to the kerlex.  Patient currently has no sticky adhesive dressings applied to his skin tears. PACU RN will advise 5N RN to please ask continuing staff to please not cover skin tears with adhesive dressings of any kind, they just cause more skin tears. Patient agreed that his arms look better now and feel better too, greatly appreciated the vaseline gauze and soft non-adhesive dressings.

## 2018-08-01 LAB — GLUCOSE, CAPILLARY
Glucose-Capillary: 164 mg/dL — ABNORMAL HIGH (ref 70–99)
Glucose-Capillary: 184 mg/dL — ABNORMAL HIGH (ref 70–99)
Glucose-Capillary: 213 mg/dL — ABNORMAL HIGH (ref 70–99)
Glucose-Capillary: 237 mg/dL — ABNORMAL HIGH (ref 70–99)
Glucose-Capillary: 238 mg/dL — ABNORMAL HIGH (ref 70–99)
Glucose-Capillary: 263 mg/dL — ABNORMAL HIGH (ref 70–99)

## 2018-08-01 LAB — BASIC METABOLIC PANEL
Anion gap: 15 (ref 5–15)
BUN: 23 mg/dL (ref 8–23)
CO2: 24 mmol/L (ref 22–32)
Calcium: 8.2 mg/dL — ABNORMAL LOW (ref 8.9–10.3)
Chloride: 95 mmol/L — ABNORMAL LOW (ref 98–111)
Creatinine, Ser: 1.56 mg/dL — ABNORMAL HIGH (ref 0.61–1.24)
GFR calc Af Amer: 44 mL/min — ABNORMAL LOW (ref >60)
GFR calc non Af Amer: 38 mL/min — ABNORMAL LOW (ref >60)
GLUCOSE: 206 mg/dL — AB (ref 70–99)
Potassium: 3.5 mmol/L (ref 3.5–5.1)
Sodium: 134 mmol/L — ABNORMAL LOW (ref 135–145)

## 2018-08-01 LAB — CBC
HEMATOCRIT: 34.4 % — AB (ref 39.0–52.0)
Hemoglobin: 10.9 g/dL — ABNORMAL LOW (ref 13.0–17.0)
MCH: 29.7 pg (ref 26.0–34.0)
MCHC: 31.7 g/dL (ref 30.0–36.0)
MCV: 93.7 fL (ref 80.0–100.0)
Platelets: 140 10*3/uL — ABNORMAL LOW (ref 150–400)
RBC: 3.67 MIL/uL — ABNORMAL LOW (ref 4.22–5.81)
RDW: 13.8 % (ref 11.5–15.5)
WBC: 13.2 10*3/uL — ABNORMAL HIGH (ref 4.0–10.5)
nRBC: 0 % (ref 0.0–0.2)

## 2018-08-01 MED ORDER — POTASSIUM CHLORIDE CRYS ER 20 MEQ PO TBCR
40.0000 meq | EXTENDED_RELEASE_TABLET | Freq: Two times a day (BID) | ORAL | Status: AC
  Administered 2018-08-01: 40 meq via ORAL
  Filled 2018-08-01: qty 2

## 2018-08-01 MED ORDER — FUROSEMIDE 10 MG/ML IJ SOLN
40.0000 mg | Freq: Two times a day (BID) | INTRAMUSCULAR | Status: DC
  Administered 2018-08-01 (×2): 40 mg via INTRAVENOUS
  Filled 2018-08-01 (×2): qty 4

## 2018-08-01 MED ORDER — SODIUM CHLORIDE 0.9% FLUSH
10.0000 mL | INTRAVENOUS | Status: DC | PRN
  Administered 2018-08-01: 20 mL
  Administered 2018-08-04: 10 mL
  Filled 2018-08-01: qty 40

## 2018-08-01 NOTE — Progress Notes (Signed)
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 -Orthopedics consulted, underwent left hip intramedullary nail 2/21   Assessment & Plan:   Left hip fracture -Secondary to mechanical fall,  -Ortho following, underwent left hip intramedullary nailing yesterday 2/21 -Eliquis resumed postop -PT today -Social work consult for ST rehab  Acute on chronic diastolic congestive heart failure  -Echo on 10/2017 with ejection fraction 55 to 69% and mild diastolic dysfunction,  -appears slightly vol overloaded, IV lasix x2 today -monitor I/O, Bmet  Chronic hypoxic respiratory failure -Due to COPD and likely radiation fibrosis on 6 L home O2 -Continue pulmonary toilet with flutter valve, incentive spirometer, duo nebs -Appreciate pulmonary input -Diuretics as above -Prednisone increase to 20mg  daily for 3days then lower to 10mg   COPD -Stable, now on prednisone at 20 as above -Duo nebs  Paroxysmal A. fib with controlled rate -Eliquis held for surgery, restarted -Heart rate controlled, continue Cardizem CD and amiodarone   Hypothyroidism - cont with synthroid  CKD stage III -Patent at baseline, continue to monitor closely  Diabetes mellitus type II  - Hold metformin , CBGs stable, continue Levemir at 15 units twice daily, continue with insulin sliding scale  Hyperlipidemia; continue with Lipitor  DVT Prophylaxis : Eliquis resumed  Full code Family communication: Daughter at  bedside Disposition will likely need rehab   Consults called: PCCM, ortho    Procedures:   Antimicrobials:    Subjective: -mild shortness of breath, congestion and cough  Objective: Vitals:   08/01/18 0354 08/01/18 0751 08/01/18 0855 08/01/18 1129  BP: 111/65 114/62  107/68  Pulse: 80 85 88 99  Resp: 16 18 (!) 21 20  Temp: 97.7 F (36.5 C) 97.6 F (36.4 C)  98.3 F (36.8 C)  TempSrc: Oral Oral  Oral  SpO2: 93% (!) 87% 91% (!) 89%  Weight:      Height:        Intake/Output Summary (Last 24 hours) at 08/01/2018 1313 Last data filed at 08/01/2018 0844 Gross per 24 hour  Intake 440 ml  Output 1940 ml  Net -1500 ml   Filed Weights   07/15/2018 1403  Weight: 98.4 kg    Examination:  Gen: Awake, Alert, Oriented X 3, obese, frail chronically ill male, laying in bed, no distress HEENT: PERRLA, Neck supple, no JVD Lungs: diffuse scattered ronchi, few wheezes CVS: RRR,No Gallops,Rubs or new Murmurs Abd: soft, Non tender, non distended, BS present Extremities: no edema Skin: L hip w/ dressing Psychiatry: Judgement and insight appear normal. Mood & affect appropriate.     Data Reviewed:   CBC: Recent Labs  Lab 07/26/2018 1426 07/22/2018 0406 08/01/18 0139  WBC 11.5* 11.6* 13.2*  NEUTROABS 9.8*  --   --   HGB 14.1 12.8* 10.9*  HCT 45.1 40.1 34.4*  MCV 95.1 93.5 93.7  PLT 185 179 678*   Basic Metabolic Panel: Recent Labs  Lab 07/20/2018 1426 07/28/2018 0406 08/01/18 0139  NA 136 137 134*  K 3.7 3.6 3.5  CL 96* 96* 95*  CO2 31 31  24  GLUCOSE 279* 196* 206*  BUN 28* 24* 23  CREATININE 1.42* 1.48* 1.56*  CALCIUM 8.4* 8.5* 8.2*   GFR: Estimated Creatinine Clearance: 35.6 mL/min (A) (by C-G formula based on SCr of 1.56 mg/dL (H)). Liver Function Tests: No results for input(s): AST, ALT, ALKPHOS, BILITOT, PROT, ALBUMIN in the last 168 hours. No results for input(s): LIPASE, AMYLASE in the last 168 hours. No results for input(s): AMMONIA in the last 168  hours. Coagulation Profile: Recent Labs  Lab 07/16/2018 1426  INR 1.11   Cardiac Enzymes: No results for input(s): CKTOTAL, CKMB, CKMBINDEX, TROPONINI in the last 168 hours. BNP (last 3 results) No results for input(s): PROBNP in the last 8760 hours. HbA1C: No results for input(s): HGBA1C in the last 72 hours. CBG: Recent Labs  Lab 08/02/2018 2005 08/01/18 0021 08/01/18 0351 08/01/18 0739 08/01/18 1127  GLUCAP 313* 237* 164* 184* 213*   Lipid Profile: No results for input(s): CHOL, HDL, LDLCALC, TRIG, CHOLHDL, LDLDIRECT in the last 72 hours. Thyroid Function Tests: No results for input(s): TSH, T4TOTAL, FREET4, T3FREE, THYROIDAB in the last 72 hours. Anemia Panel: No results for input(s): VITAMINB12, FOLATE, FERRITIN, TIBC, IRON, RETICCTPCT in the last 72 hours. Urine analysis:    Component Value Date/Time   COLORURINE YELLOW 05/17/2018 1819   APPEARANCEUR CLEAR 05/17/2018 1819   LABSPEC 1.009 05/17/2018 1819   PHURINE 5.0 05/17/2018 1819   GLUCOSEU >=500 (A) 05/17/2018 1819   HGBUR NEGATIVE 05/17/2018 1819   BILIRUBINUR NEGATIVE 05/17/2018 1819   KETONESUR NEGATIVE 05/17/2018 1819   PROTEINUR NEGATIVE 05/17/2018 1819   UROBILINOGEN 0.2 12/09/2010 1650   NITRITE NEGATIVE 05/17/2018 1819   LEUKOCYTESUR NEGATIVE 05/17/2018 1819   Sepsis Labs: @LABRCNTIP (procalcitonin:4,lacticidven:4)  ) Recent Results (from the past 240 hour(s))  Surgical pcr screen     Status: None   Collection Time: 07/12/2018  6:50 PM  Result Value Ref Range Status   MRSA, PCR NEGATIVE NEGATIVE Final   Staphylococcus aureus NEGATIVE NEGATIVE Final    Comment: (NOTE) The Xpert SA Assay (FDA approved for NASAL specimens in patients 82 years of age and older), is one component of a comprehensive surveillance program. It is not intended to diagnose infection nor to guide or monitor treatment. Performed at [facility], [place] [address], [place], [place] [address]          Radiology  Studies: Dg Chest 1 View  Result Date: 07/28/2018 CLINICAL DATA:  85 year old who fell and sustained an intertrochanteric LEFT femoral neck fracture. Preoperative evaluation. EXAM: CHEST  1 VIEW COMPARISON:  05/17/2018 and earlier. FINDINGS: AP SEMI-ERECT image was obtained. Cardiac silhouette upper normal in size to slightly enlarged, unchanged. Thoracic aorta atherosclerotic. Hilar and mediastinal contours otherwise unremarkable. LEFT subclavian Port-A-Cath with its tip projected over the LOWER SVC, unchanged; possible compression of the catheter as it courses beneath the LEFT clavicle which was not visible on prior examinations. Prominent bronchovascular markings diffusely and moderate central peribronchial thickening, unchanged. Chronic pleuroparenchymal scarring at the RIGHT lung base. No new pulmonary parenchymal abnormalities. IMPRESSION: 1. COPD. Stable borderline heart size. No acute cardiopulmonary disease. 2. Possible compression of the LEFT subclavian Port-A-Cath catheter as it courses beneath the clavicle. Electronically Signed   By: Evangeline Dakin M.D.   On: 07/29/2018 15:48   Dg C-arm 1-60 Min  Result Date: 07/28/2018 CLINICAL DATA:  Intertrochanteric fracture of the left hip. EXAM: DG C-ARM 61-120 MIN; OPERATIVE LEFT HIP WITH PELVIS COMPARISON:  Radiographs 07/20/2018 FINDINGS: Multiple fluoroscopic spot images  demonstrate placement of a long gamma nail with a proximal dynamic hip screw which is well positioned. Anatomic reduction of the intertrochanteric fracture. No complicating features. IMPRESSION: Closed anatomic reduction and internal fixation of an intertrochanteric fracture of the left hip. Well position hardware without complicating features. Electronically Signed   By: Marijo Sanes M.D.   On: 07/21/2018 09:29   Dg Hip Operative Unilat W Or W/o Pelvis Left  Result Date: 08/06/2018 CLINICAL DATA:  Intertrochanteric fracture of the left hip. EXAM: DG C-ARM 61-120 MIN; OPERATIVE  LEFT HIP WITH PELVIS COMPARISON:  Radiographs 07/25/2018 FINDINGS: Multiple fluoroscopic spot images demonstrate placement of a long gamma nail with a proximal dynamic hip screw which is well positioned. Anatomic reduction of the intertrochanteric fracture. No complicating features. IMPRESSION: Closed anatomic reduction and internal fixation of an intertrochanteric fracture of the left hip. Well position hardware without complicating features. Electronically Signed   By: Marijo Sanes M.D.   On: 07/24/2018 09:29   Dg Hip Unilat W Or Wo Pelvis 2-3 Views Left  Result Date: 07/14/2018 CLINICAL DATA:  85 year old who fell earlier today and injured the LEFT hip. Initial encounter. EXAM: DG HIP (WITH OR WITHOUT PELVIS) 2-3V LEFT COMPARISON:  None. FINDINGS: Acute intertrochanteric LEFT femoral neck fracture. LEFT hip joint anatomically aligned with mild joint space narrowing. Osseous demineralization. Included AP pelvis demonstrates symmetric mild joint space narrowing in the contralateral RIGHT hip. No fractures elsewhere. Sacroiliac joints and symphysis pubis anatomically aligned without diastasis and demonstrate mild degenerative changes. IMPRESSION: Acute intertrochanteric LEFT femoral neck fracture. Electronically Signed   By: Evangeline Dakin M.D.   On: 07/27/2018 15:44        Scheduled Meds: . amiodarone  200 mg Oral Q M,W,F  . apixaban  5 mg Oral BID  . arformoterol  15 mcg Nebulization BID  . atorvastatin  20 mg Oral QHS  . budesonide (PULMICORT) nebulizer solution  0.5 mg Nebulization BID  . chlorhexidine  15 mL Mouth Rinse BID  . diltiazem  120 mg Oral Daily  . furosemide  40 mg Intravenous BID  . guaiFENesin  1,200 mg Oral BID  . insulin aspart  0-9 Units Subcutaneous Q4H  . insulin detemir  12 Units Subcutaneous BID  . ipratropium-albuterol  3 mL Nebulization TID  . levothyroxine  25 mcg Oral QAC breakfast  . loratadine  10 mg Oral Daily  . mouth rinse  15 mL Mouth Rinse q12n4p  .  oxybutynin  5 mg Oral QHS  . potassium chloride  40 mEq Oral BID  . predniSONE  20 mg Oral Q breakfast  . senna-docusate  1 tablet Oral BID  . sodium chloride HYPERTONIC  4 mL Nebulization Once   Continuous Infusions:    LOS: 2 days    Time spent: 75min    Domenic Polite, MD [facility]  08/01/2018, 1:13 PM

## 2018-08-01 NOTE — Evaluation (Signed)
Physical Therapy Evaluation Patient Details Name: [name] MRN: [id number] DOB: 01/15/1925 Today's Date: 08/01/2018   History of Present Illness  85 y.o. 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, cancer free, alos has HTN, DJD peripheral neuropathy   Clinical Impression  Patient required mod a for bed mobility and max a+2 to transfer to the chair. His sao2 dropped down in the low 80's with transfers but was 88 after sitting in the chair. He would benefit from further skilled acute therapy as well as rehab at a SNF.     Follow Up Recommendations SNF    Equipment Recommendations       Recommendations for Other Services Rehab consult     Precautions / Restrictions Precautions Precautions: Fall Restrictions Weight Bearing Restrictions: Yes LLE Weight Bearing: Weight bearing as tolerated      Mobility  Bed Mobility Overal bed mobility: Needs Assistance Bed Mobility: Supine to Sit     Supine to sit: Mod assist;+2 for physical assistance     General bed mobility comments: moderate assistance to assist left LE out of the bed and to scoot patients hip to the edge of the bed. Mod cuing for hand placement and use of the trapeeze  Transfers Overall transfer level: Needs assistance Equipment used: Rolling walker (2 wheeled) Transfers: Sit to/from Stand Sit to Stand: Max assist;+2 physical assistance         General transfer comment: Max a+2 to come to standing with rolling walker. Moderate cuing for foot placement and to take steps. Soa2 remined in the low 80s during transfer on 6L of NCo2   Ambulation/Gait Ambulation/Gait assistance: Max assist;+2 physical assistance Gait Distance (Feet): 3 Feet Assistive device: Rolling walker (2 wheeled) Gait Pattern/deviations: Step-to pattern Gait velocity: decreased   General Gait  Details: difficulty taking steps to the chair. <Moderat cuing for weight shift. Max a for strength; sao2 remianed at 80%   Stairs            Wheelchair Mobility    Modified Rankin (Stroke Patients Only)       Balance Overall balance assessment: Needs assistance Sitting-balance support: No upper extremity supported;Single extremity supported Sitting balance-Leahy Scale: Fair     Standing balance support: Bilateral upper extremity supported Standing balance-Leahy Scale: Poor                               Pertinent Vitals/Pain Pain Assessment: Faces Pain Score: 4 (before moving ) Faces Pain Scale: Hurts whole lot(with movement ) Pain Location: left hip  Pain Descriptors / Indicators: Aching;Guarding Pain Intervention(s): Limited activity within patient's tolerance;Monitored during session;Repositioned    Home Living Family/patient expects to be discharged to:: Skilled nursing facility Living Arrangements: Alone Available Help at Discharge: Family;Available PRN/intermittently Type of Home: House Home Access: Stairs to enter Entrance Stairs-Rails: None Entrance Stairs-Number of Steps: 2 Home Layout: Two level;Full bath on main level;Bed/bath upstairs;Able to live on main level with bedroom/bathroom Home Equipment: Kasandra Knudsen - single point;Shower seat;Grab bars - tub/shower;Grab bars - toilet;Hand held shower head;Walker - 2 wheels;Adaptive equipment;Toilet riser Additional Comments: has home O2, continuous 6 L and aware of other ADL A/E     Prior Function Level of Independence: Independent with assistive device(s)         Comments: pt still drives  and cooks all his own meals     Hand Dominance   Dominant Hand: Right    Extremity/Trunk Assessment   Upper Extremity Assessment Upper Extremity Assessment: Overall WFL for tasks assessed    Lower Extremity Assessment Lower Extremity Assessment: LLE deficits/detail LLE: Unable to fully assess due to  pain;Unable to fully assess due to immobilization    Cervical / Trunk Assessment Cervical / Trunk Assessment: Normal  Communication   Communication: No difficulties  Cognition Arousal/Alertness: Awake/alert Behavior During Therapy: WFL for tasks assessed/performed Overall Cognitive Status: Within Functional Limits for tasks assessed                                        General Comments      Exercises     Assessment/Plan    PT Assessment    PT Problem List         PT Treatment Interventions      PT Goals (Current goals can be found in the Care Plan section)  Acute Rehab PT Goals Patient Stated Goal: to have less pain  PT Goal Formulation: With patient Time For Goal Achievement: 08/01/18 Potential to Achieve Goals: Good    Frequency     Barriers to discharge        Co-evaluation               AM-PAC PT "6 Clicks" Mobility  Outcome Measure Help needed turning from your back to your side while in a flat bed without using bedrails?: A Lot Help needed moving from lying on your back to sitting on the side of a flat bed without using bedrails?: A Lot Help needed moving to and from a bed to a chair (including a wheelchair)?: Total Help needed standing up from a chair using your arms (e.g., wheelchair or bedside chair)?: Total Help needed to walk in hospital room?: Total Help needed climbing 3-5 steps with a railing? : Total 6 Click Score: 8    End of Session Equipment Utilized During Treatment: Gait belt Activity Tolerance: Patient limited by pain          Time: 1100-1125 PT Time Calculation (min) (ACUTE ONLY): 25 min   Charges:   PT Evaluation $PT Eval Moderate Complexity: 1 Mod           Carney Living PT DPT  08/01/2018, 2:16 PM

## 2018-08-01 NOTE — NC FL2 (Signed)
[place] LEVEL OF CARE SCREENING TOOL     IDENTIFICATION  Patient Name: [name] Birthdate: May 08, 1925 Sex: male Admission Date (Current Location): 07/23/2018  [facility] [place] and [place] Number:  Herbalist and Address:  [facility], [place] [address], [place], [place] [address]      Provider Number: [id number]  Attending Physician Name and Address:  Domenic Polite, MD  Relative Name and Phone Number:       Current Level of Care: Hospital Recommended Level of Care: [place] Prior Approval Number:    Date Approved/Denied:   PASRR Number: [id number]  Discharge Plan: SNF    Current Diagnoses: Patient Active Problem List   Diagnosis Date Noted  . Closed left hip fracture ([place]) 07/16/2018  . Respiratory failure ([place]) 05/17/2018  . Thrush 01/13/2018  . Chronic diastolic (congestive) heart failure ([place]) 10/06/2017  . GERD (gastroesophageal reflux disease) 10/06/2017  . Hypothyroidism 10/06/2017  . COPD with acute exacerbation ([place]) 10/06/2017  . Chronic respiratory failure with hypoxia ([place]) 09/14/2017  . Pleural effusion 07/22/2017  . Malignant neoplasm of hilus of right lung ([place]) 09/25/2016  . Iron deficiency anemia 03/27/2016  . Malabsorption of iron 03/27/2016  . Iron deficiency anemia due to chronic blood loss 03/27/2016  . Loss of weight 02/19/2016  . Type II diabetes mellitus with renal manifestations ([place]) 05/08/2015  . CAP (community acquired pneumonia) 05/07/2015  . Lactic acidosis 05/07/2015  . OSA on CPAP 01/03/2015  . Hypoxemia 12/31/2013  . Long term current use of anticoagulant therapy 11/12/2012  . History of DVT of right axillary vein 11/07/2012  . Secondary diabetes with peripheral neuropathy ([place]) 11/07/2012  . Hypertension 11/07/2012  . Hyperlipidemia 11/07/2012  . Benign prostatic hyperplasia 11/07/2012  . Paroxysmal atrial fibrillation ([place]) 11/07/2012  . Diverticulosis   . Lung  cancer ([place])   . Diabetic peripheral neuropathy ([place])   . COPD (chronic obstructive pulmonary disease) ([place])   . Allergic rhinitis     Orientation RESPIRATION BLADDER Height & Weight     Self, Time, Situation, Place  O2(HFNC 6L) Continent Weight: 217 lb (98.4 kg) Height:  5\' 10"  (177.8 cm)  BEHAVIORAL SYMPTOMS/MOOD NEUROLOGICAL BOWEL NUTRITION STATUS      Continent Diet(carb modified, thin liquids)  AMBULATORY STATUS COMMUNICATION OF NEEDS Skin   Extensive Assist Verbally Surgical wounds(closed incision on hip, adhesive bandage dressing.)                       Personal Care Assistance Level of Assistance  Dressing, Feeding, Bathing Bathing Assistance: Maximum assistance Feeding assistance: Independent Dressing Assistance: Maximum assistance     Functional Limitations Info  Sight, Hearing, Speech Sight Info: Adequate Hearing Info: Adequate Speech Info: Adequate    SPECIAL CARE FACTORS FREQUENCY  PT (By licensed PT), OT (By licensed OT)     PT Frequency: 3x OT Frequency: 3x            Contractures Contractures Info: Not present    Additional Factors Info  Code Status, Allergies Code Status Info: Full Code Allergies Info: Tape           Current Medications (08/01/2018):  This is the current hospital active medication list Current Facility-Administered Medications  Medication Dose Route Frequency Provider Last Rate Last Dose  . acetaminophen (TYLENOL) tablet 325-650 mg  325-650 mg Oral Q6H PRN Joanell Rising K, PA-C      . albuterol (PROVENTIL) (2.5 MG/3ML) 0.083% nebulizer solution  2.5 mg  2.5 mg Nebulization Q6H PRN Leighton Parody, PA-C      . amiodarone (PACERONE) tablet 200 mg  200 mg Oral Q M,W,F Joanell Rising K, PA-C      . apixaban (ELIQUIS) tablet 5 mg  5 mg Oral BID Joanell Rising K, PA-C   5 mg at 08/01/18 0800  . arformoterol (BROVANA) nebulizer solution 15 mcg  15 mcg Nebulization BID Leighton Parody, PA-C   15 mcg at 08/01/18 1017  .  atorvastatin (LIPITOR) tablet 20 mg  20 mg Oral QHS Leighton Parody, PA-C   20 mg at 07/24/2018 2057  . bisacodyl (DULCOLAX) suppository 10 mg  10 mg Rectal Daily PRN Leighton Parody, PA-C      . budesonide (PULMICORT) nebulizer solution 0.5 mg  0.5 mg Nebulization BID Leighton Parody, PA-C   0.5 mg at 08/01/18 5102  . chlorhexidine (PERIDEX) 0.12 % solution 15 mL  15 mL Mouth Rinse BID Leighton Parody, PA-C   15 mL at 08/01/18 5852  . diltiazem (CARDIZEM CD) 24 hr capsule 120 mg  120 mg Oral Daily Leighton Parody, PA-C   120 mg at 08/01/18 0800  . furosemide (LASIX) injection 40 mg  40 mg Intravenous BID Domenic Polite, MD   40 mg at 08/01/18 1042  . guaiFENesin (MUCINEX) 12 hr tablet 1,200 mg  1,200 mg Oral BID Leighton Parody, PA-C   1,200 mg at 08/01/18 0801  . guaiFENesin (MUCINEX) 12 hr tablet 600 mg  600 mg Oral BID PRN Leighton Parody, PA-C      . HYDROcodone-acetaminophen (NORCO) 7.5-325 MG per tablet 1-2 tablet  1-2 tablet Oral Q4H PRN Leighton Parody, PA-C   2 tablet at 08/01/18 1227  . HYDROcodone-acetaminophen (NORCO/VICODIN) 5-325 MG per tablet 1-2 tablet  1-2 tablet Oral Q4H PRN Leighton Parody, PA-C   2 tablet at 08/01/18 0347  . insulin aspart (novoLOG) injection 0-9 Units  0-9 Units Subcutaneous Q4H Domenic Polite, MD   3 Units at 08/01/18 1144  . insulin detemir (LEVEMIR) injection 12 Units  12 Units Subcutaneous BID Leighton Parody, PA-C   12 Units at 08/01/18 0801  . ipratropium-albuterol (DUONEB) 0.5-2.5 (3) MG/3ML nebulizer solution 3 mL  3 mL Nebulization TID Joanell Rising K, PA-C   3 mL at 08/01/18 1407  . ipratropium-albuterol (DUONEB) 0.5-2.5 (3) MG/3ML nebulizer solution 3 mL  3 mL Nebulization Q6H PRN Leighton Parody, PA-C      . levothyroxine (SYNTHROID, LEVOTHROID) tablet 25 mcg  25 mcg Oral QAC breakfast Leighton Parody, PA-C   25 mcg at 08/01/18 0531  . loratadine (CLARITIN) tablet 10 mg  10 mg Oral Daily Leighton Parody, PA-C   10 mg at 08/01/18 0800  .  MEDLINE mouth rinse  15 mL Mouth Rinse q12n4p Leighton Parody, PA-C   15 mL at 08/01/18 1145  . menthol-cetylpyridinium (CEPACOL) lozenge 3 mg  1 lozenge Oral PRN Leighton Parody, PA-C       Or  . phenol (CHLORASEPTIC) mouth spray 1 spray  1 spray Mouth/Throat PRN Leighton Parody, PA-C      . morphine 2 MG/ML injection 0.5 mg  0.5 mg Intravenous Q2H PRN Joanell Rising K, PA-C   0.5 mg at 07/28/2018 1152  . ondansetron (ZOFRAN) tablet 4 mg  4 mg Oral Q6H PRN Leighton Parody, PA-C       Or  . ondansetron [facility]) injection 4 mg  4 mg Intravenous Q6H PRN Leighton Parody, PA-C      . oxybutynin (DITROPAN) tablet 5 mg  5 mg Oral QHS Leighton Parody, PA-C   5 mg at 07/21/2018 2057  . polyethylene glycol (MIRALAX / GLYCOLAX) packet 17 g  17 g Oral Daily PRN Joanell Rising K, PA-C      . potassium chloride SA (K-DUR,KLOR-CON) CR tablet 40 mEq  40 mEq Oral BID Domenic Polite, MD      . predniSONE (DELTASONE) tablet 20 mg  20 mg Oral Q breakfast Simonne Maffucci B, MD   20 mg at 08/01/18 0756  . senna-docusate (Senokot-S) tablet 1 tablet  1 tablet Oral BID Domenic Polite, MD   1 tablet at 08/01/18 0801  . sodium chloride flush (NS) 0.9 % injection 10-40 mL  10-40 mL Intracatheter PRN Domenic Polite, MD   20 mL at 08/01/18 0448  . sodium chloride HYPERTONIC 3 % nebulizer solution 4 mL  4 mL Nebulization Once Simonne Maffucci B, MD      . temazepam (RESTORIL) capsule 15 mg  15 mg Oral QHS PRN Leighton Parody, PA-C   15 mg at 07/27/2018 2228   Facility-Administered Medications Ordered in Other Encounters  Medication Dose Route Frequency Provider Last Rate Last Dose  . sodium chloride flush (NS) 0.9 % injection 10 mL  10 mL Intravenous PRN Volanda Napoleon, MD   10 mL at 11/20/16 1153     Discharge Medications: Please see discharge summary for a list of discharge medications.  Relevant Imaging Results:  Relevant Lab Results:   Additional Information SS#: [id number]  Eileen Stanford,  LCSW

## 2018-08-02 ENCOUNTER — Inpatient Hospital Stay (HOSPITAL_COMMUNITY): Payer: Medicare Other

## 2018-08-02 ENCOUNTER — Encounter (HOSPITAL_COMMUNITY): Payer: Self-pay | Admitting: Orthopedic Surgery

## 2018-08-02 LAB — BASIC METABOLIC PANEL
Anion gap: 11 (ref 5–15)
BUN: 25 mg/dL — ABNORMAL HIGH (ref 8–23)
CO2: 29 mmol/L (ref 22–32)
Calcium: 8.1 mg/dL — ABNORMAL LOW (ref 8.9–10.3)
Chloride: 92 mmol/L — ABNORMAL LOW (ref 98–111)
Creatinine, Ser: 1.57 mg/dL — ABNORMAL HIGH (ref 0.61–1.24)
GFR calc Af Amer: 44 mL/min — ABNORMAL LOW (ref >60)
GFR, EST NON AFRICAN AMERICAN: 38 mL/min — AB (ref >60)
Glucose, Bld: 214 mg/dL — ABNORMAL HIGH (ref 70–99)
POTASSIUM: 3.9 mmol/L (ref 3.5–5.1)
Sodium: 132 mmol/L — ABNORMAL LOW (ref 135–145)

## 2018-08-02 LAB — GLUCOSE, CAPILLARY
GLUCOSE-CAPILLARY: 222 mg/dL — AB (ref 70–99)
GLUCOSE-CAPILLARY: 290 mg/dL — AB (ref 70–99)
Glucose-Capillary: 187 mg/dL — ABNORMAL HIGH (ref 70–99)
Glucose-Capillary: 196 mg/dL — ABNORMAL HIGH (ref 70–99)
Glucose-Capillary: 218 mg/dL — ABNORMAL HIGH (ref 70–99)
Glucose-Capillary: 228 mg/dL — ABNORMAL HIGH (ref 70–99)
Glucose-Capillary: 260 mg/dL — ABNORMAL HIGH (ref 70–99)

## 2018-08-02 LAB — CBC
HCT: 31.5 % — ABNORMAL LOW (ref 39.0–52.0)
Hemoglobin: 10 g/dL — ABNORMAL LOW (ref 13.0–17.0)
MCH: 29.9 pg (ref 26.0–34.0)
MCHC: 31.7 g/dL (ref 30.0–36.0)
MCV: 94.3 fL (ref 80.0–100.0)
Platelets: 141 10*3/uL — ABNORMAL LOW (ref 150–400)
RBC: 3.34 MIL/uL — ABNORMAL LOW (ref 4.22–5.81)
RDW: 13.8 % (ref 11.5–15.5)
WBC: 9.6 10*3/uL (ref 4.0–10.5)
nRBC: 0 % (ref 0.0–0.2)

## 2018-08-02 MED ORDER — FUROSEMIDE 10 MG/ML IJ SOLN
60.0000 mg | Freq: Once | INTRAMUSCULAR | Status: AC
  Administered 2018-08-02: 60 mg via INTRAVENOUS
  Filled 2018-08-02: qty 6

## 2018-08-02 MED ORDER — FUROSEMIDE 10 MG/ML IJ SOLN
40.0000 mg | Freq: Two times a day (BID) | INTRAMUSCULAR | Status: DC
  Administered 2018-08-02: 40 mg via INTRAVENOUS
  Filled 2018-08-02: qty 4

## 2018-08-02 MED ORDER — POLYETHYLENE GLYCOL 3350 17 G PO PACK
17.0000 g | PACK | Freq: Every day | ORAL | Status: DC
  Administered 2018-08-02 – 2018-08-06 (×5): 17 g via ORAL
  Filled 2018-08-02 (×5): qty 1

## 2018-08-02 MED ORDER — INSULIN DETEMIR 100 UNIT/ML ~~LOC~~ SOLN
17.0000 [IU] | Freq: Two times a day (BID) | SUBCUTANEOUS | Status: DC
  Administered 2018-08-02 – 2018-08-06 (×8): 17 [IU] via SUBCUTANEOUS
  Filled 2018-08-02 (×10): qty 0.17

## 2018-08-02 MED ORDER — PREDNISONE 10 MG PO TABS
10.0000 mg | ORAL_TABLET | Freq: Every day | ORAL | Status: DC
  Administered 2018-08-02 – 2018-08-06 (×5): 10 mg via ORAL
  Filled 2018-08-02 (×5): qty 1

## 2018-08-02 MED ORDER — POTASSIUM CHLORIDE CRYS ER 20 MEQ PO TBCR
40.0000 meq | EXTENDED_RELEASE_TABLET | Freq: Two times a day (BID) | ORAL | Status: AC
  Administered 2018-08-02 (×2): 40 meq via ORAL
  Filled 2018-08-02 (×2): qty 2

## 2018-08-02 NOTE — NC FL2 (Signed)
[place] LEVEL OF CARE SCREENING TOOL     IDENTIFICATION  Patient Name: [name] Birthdate: 29-Dec-1924 Sex: male Admission Date (Current Location): 07/12/2018  [facility] and [place] Number:  Herbalist and Address:  The [place]. [facility], [place] [address], [place], [place] [address]      Provider Number: [id number]  Attending Physician Name and Address:  Domenic Polite, MD  Relative Name and Phone Number:       Current Level of Care: Hospital Recommended Level of Care: [place] Prior Approval Number:    Date Approved/Denied:   PASRR Number: [id number]  Discharge Plan: SNF    Current Diagnoses: Patient Active Problem List   Diagnosis Date Noted  . Closed left hip fracture ([place]) 07/26/2018  . Respiratory failure ([place]) 05/17/2018  . Thrush 01/13/2018  . Chronic diastolic (congestive) heart failure ([place]) 10/06/2017  . GERD (gastroesophageal reflux disease) 10/06/2017  . Hypothyroidism 10/06/2017  . COPD with acute exacerbation ([place]) 10/06/2017  . Chronic respiratory failure with hypoxia ([place]) 09/14/2017  . Pleural effusion 07/22/2017  . Malignant neoplasm of hilus of right lung ([place]) 09/25/2016  . Iron deficiency anemia 03/27/2016  . Malabsorption of iron 03/27/2016  . Iron deficiency anemia due to chronic blood loss 03/27/2016  . Loss of weight 02/19/2016  . Type II diabetes mellitus with renal manifestations ([place]) 05/08/2015  . CAP (community acquired pneumonia) 05/07/2015  . Lactic acidosis 05/07/2015  . OSA on CPAP 01/03/2015  . Hypoxemia 12/31/2013  . Long term current use of anticoagulant therapy 11/12/2012  . History of DVT of right axillary vein 11/07/2012  . Secondary diabetes with peripheral neuropathy ([place]) 11/07/2012  . Hypertension 11/07/2012  . Hyperlipidemia 11/07/2012  . Benign prostatic hyperplasia 11/07/2012  . Paroxysmal atrial fibrillation ([place]) 11/07/2012  . Diverticulosis   . Lung  cancer ([place])   . Diabetic peripheral neuropathy ([place])   . COPD (chronic obstructive pulmonary disease) ([place])   . Allergic rhinitis     Orientation RESPIRATION BLADDER Height & Weight     Self, Time, Situation, Place  O2(HFNC 6L) Continent Weight: 217 lb (98.4 kg) Height:  5\' 10"  (177.8 cm)  BEHAVIORAL SYMPTOMS/MOOD NEUROLOGICAL BOWEL NUTRITION STATUS      Continent Diet(carb modified, thin liquids)  AMBULATORY STATUS COMMUNICATION OF NEEDS Skin   Extensive Assist Verbally Surgical wounds(closed incision on hip, adhesive bandage dressing.)                       Personal Care Assistance Level of Assistance  Dressing, Feeding, Bathing Bathing Assistance: Maximum assistance Feeding assistance: Independent Dressing Assistance: Maximum assistance     Functional Limitations Info  Sight, Hearing, Speech Sight Info: Adequate Hearing Info: Impaired(hearing aides) Speech Info: Adequate    SPECIAL CARE FACTORS FREQUENCY  PT (By licensed PT), OT (By licensed OT)     PT Frequency: 5x/wk OT Frequency: 5x/wk            Contractures Contractures Info: Not present    Additional Factors Info  Code Status, Allergies Code Status Info: Full Code Allergies Info: Tape           Current Medications (08/02/2018):  This is the current hospital active medication list Current Facility-Administered Medications  Medication Dose Route Frequency Provider Last Rate Last Dose  . acetaminophen (TYLENOL) tablet 325-650 mg  325-650 mg Oral Q6H PRN Joanell Rising K, PA-C      . albuterol (PROVENTIL) (2.5 MG/3ML) 0.083% nebulizer  solution 2.5 mg  2.5 mg Nebulization Q6H PRN Leighton Parody, PA-C      . amiodarone (PACERONE) tablet 200 mg  200 mg Oral Q M,W,F Joanell Rising K, PA-C      . apixaban (ELIQUIS) tablet 5 mg  5 mg Oral BID Leighton Parody, PA-C   5 mg at 08/02/18 1950  . arformoterol (BROVANA) nebulizer solution 15 mcg  15 mcg Nebulization BID Leighton Parody, PA-C   15 mcg at  08/02/18 0840  . atorvastatin (LIPITOR) tablet 20 mg  20 mg Oral QHS Leighton Parody, PA-C   20 mg at 08/01/18 2234  . bisacodyl (DULCOLAX) suppository 10 mg  10 mg Rectal Daily PRN Leighton Parody, PA-C      . budesonide (PULMICORT) nebulizer solution 0.5 mg  0.5 mg Nebulization BID Leighton Parody, PA-C   0.5 mg at 08/02/18 0841  . chlorhexidine (PERIDEX) 0.12 % solution 15 mL  15 mL Mouth Rinse BID Leighton Parody, PA-C   15 mL at 08/02/18 0836  . diltiazem (CARDIZEM CD) 24 hr capsule 120 mg  120 mg Oral Daily Leighton Parody, PA-C   120 mg at 08/02/18 9326  . furosemide (LASIX) injection 40 mg  40 mg Intravenous BID Domenic Polite, MD   40 mg at 08/02/18 7124  . guaiFENesin (MUCINEX) 12 hr tablet 1,200 mg  1,200 mg Oral BID Leighton Parody, PA-C   1,200 mg at 08/02/18 5809  . guaiFENesin (MUCINEX) 12 hr tablet 600 mg  600 mg Oral BID PRN Leighton Parody, PA-C      . HYDROcodone-acetaminophen (NORCO) 7.5-325 MG per tablet 1-2 tablet  1-2 tablet Oral Q4H PRN Leighton Parody, PA-C   2 tablet at 08/01/18 1620  . HYDROcodone-acetaminophen (NORCO/VICODIN) 5-325 MG per tablet 1-2 tablet  1-2 tablet Oral Q4H PRN Leighton Parody, PA-C   2 tablet at 08/02/18 1033  . insulin aspart (novoLOG) injection 0-9 Units  0-9 Units Subcutaneous Q4H Domenic Polite, MD   2 Units at 08/02/18 0845  . insulin detemir (LEVEMIR) injection 17 Units  17 Units Subcutaneous BID Domenic Polite, MD      . ipratropium-albuterol (DUONEB) 0.5-2.5 (3) MG/3ML nebulizer solution 3 mL  3 mL Nebulization TID Joanell Rising K, PA-C   3 mL at 08/02/18 0841  . ipratropium-albuterol (DUONEB) 0.5-2.5 (3) MG/3ML nebulizer solution 3 mL  3 mL Nebulization Q6H PRN Leighton Parody, PA-C      . levothyroxine (SYNTHROID, LEVOTHROID) tablet 25 mcg  25 mcg Oral QAC breakfast Leighton Parody, PA-C   25 mcg at 08/02/18 0455  . loratadine (CLARITIN) tablet 10 mg  10 mg Oral Daily Leighton Parody, PA-C   10 mg at 08/02/18 9833  . MEDLINE  mouth rinse  15 mL Mouth Rinse q12n4p Leighton Parody, PA-C   15 mL at 08/01/18 1145  . menthol-cetylpyridinium (CEPACOL) lozenge 3 mg  1 lozenge Oral PRN Leighton Parody, PA-C       Or  . phenol (CHLORASEPTIC) mouth spray 1 spray  1 spray Mouth/Throat PRN Leighton Parody, PA-C      . morphine 2 MG/ML injection 0.5 mg  0.5 mg Intravenous Q2H PRN Joanell Rising K, PA-C   0.5 mg at 07/24/2018 1152  . ondansetron (ZOFRAN) tablet 4 mg  4 mg Oral Q6H PRN Leighton Parody, PA-C       Or  . ondansetron [facility]) injection 4 mg  4 mg Intravenous  Q6H PRN Leighton Parody, PA-C      . oxybutynin (DITROPAN) tablet 5 mg  5 mg Oral QHS Leighton Parody, PA-C   5 mg at 08/01/18 2235  . polyethylene glycol (MIRALAX / GLYCOLAX) packet 17 g  17 g Oral Daily Domenic Polite, MD   17 g at 08/02/18 5927  . potassium chloride SA (K-DUR,KLOR-CON) CR tablet 40 mEq  40 mEq Oral BID Domenic Polite, MD   40 mEq at 08/02/18 6394  . predniSONE (DELTASONE) tablet 10 mg  10 mg Oral Q breakfast Domenic Polite, MD   10 mg at 08/02/18 3200  . senna-docusate (Senokot-S) tablet 1 tablet  1 tablet Oral BID Domenic Polite, MD   1 tablet at 08/02/18 [phone]  . sodium chloride flush (NS) 0.9 % injection 10-40 mL  10-40 mL Intracatheter PRN Domenic Polite, MD   20 mL at 08/01/18 0448  . sodium chloride HYPERTONIC 3 % nebulizer solution 4 mL  4 mL Nebulization Once Simonne Maffucci B, MD      . temazepam (RESTORIL) capsule 15 mg  15 mg Oral QHS PRN Leighton Parody, PA-C   15 mg at 08/01/18 2233   Facility-Administered Medications Ordered in Other Encounters  Medication Dose Route Frequency Provider Last Rate Last Dose  . sodium chloride flush (NS) 0.9 % injection 10 mL  10 mL Intravenous PRN Volanda Napoleon, MD   10 mL at 11/20/16 1153     Discharge Medications: Please see discharge summary for a list of discharge medications.  Relevant Imaging Results:  Relevant Lab Results:   Additional Information SS#:  [id number]  Geralynn Ochs, LCSW

## 2018-08-02 NOTE — Progress Notes (Signed)
patient is on NIV at this time.

## 2018-08-02 NOTE — Progress Notes (Signed)
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 -Orthopedics consulted, underwent left hip intramedullary nail 2/21  Assessment & Plan:   Left hip fracture -Secondary to mechanical fall,  -Ortho following, underwent left hip intramedullary nailing yesterday 2/21 -Eliquis resumed postop -Continue physical therapy -Plan for short-term rehab, social work consulted  Acute on chronic diastolic congestive heart failure  -Echo on 10/2017 with ejection fraction 55 to 51% and mild diastolic dysfunction,  -Appears slightly volume overloaded, continue IV Lasix today will transition to oral diuretics from tomorrow -monitor I/O, Bmet  Chronic hypoxic respiratory failure -Due to COPD and likely radiation fibrosis on 6 L home O2 -Continue pulmonary toilet with flutter valve, incentive spirometer, duo nebs -Appreciate pulmonary input, encouraged to increase activity, out of bed to chair -Diuretics as above -Prednisone increased to 20mg  daily for 3days peri-op, will transition back to 10 mg daily from tomorrow   COPD -Stable, on chronic prednisone at 10mg  daily this was temporarily temporarily increased to 20mg  periop -Duo nebs  Paroxysmal A. fib with controlled rate -Eliquis held for surgery, restarted -Heart rate controlled, continue Cardizem CD and amiodarone   Hypothyroidism - cont with synthroid  CKD stage III -Patent at baseline, continue to monitor closely  Diabetes mellitus type II  - Hold  metformin , CBGs improving but still high, continue Levemir dose increased back to 17 units twice daily   Hyperlipidemia; continue with Lipitor  DVT Prophylaxis : Eliquis resumed  Full code Family communication: Daughter at bedside Disposition will likely need rehab   Consults called: PCCM, ortho    Procedures:   Antimicrobials:    Subjective: -Complains of cough congestion some shortness of breath  Objective: Vitals:   08/01/18 2138 08/02/18 0015 08/02/18 0420 08/02/18 0841  BP:  97/63 102/68   Pulse:  81 71   Resp:      Temp:   98.5 F (36.9 C)   TempSrc:   Axillary   SpO2: 94% 99% 100% (!) 87%  Weight:      Height:        Intake/Output Summary (Last 24 hours) at 08/02/2018 1052 Last data filed at 08/02/2018 0854 Gross per 24 hour  Intake 240 ml  Output 300 ml  Net -60 ml   Filed Weights   08/01/2018 1403  Weight: 98.4 kg    Examination:  Gen: Awake, Alert, Oriented X 3, bees frail chronically ill-appearing male, sitting up in bed, uncomfortable HEENT: PERRLA, Neck supple, no JVD Lungs: Diffuse scattered rhonchi throughout CVS: RRR,No Gallops,Rubs or new Murmurs Abd: soft, Non tender, non distended, BS present Extremities: No edema, left hip with dressing  skin: no new rashes Psychiatry: Judgement and insight appear normal. Mood & affect appropriate.     Data Reviewed:   CBC: Recent Labs  Lab 07/12/2018 1426 08/05/2018 0406 08/01/18 0139 08/02/18 0405  WBC 11.5* 11.6* 13.2* 9.6  NEUTROABS 9.8*  --   --   --   HGB 14.1 12.8* 10.9* 10.0*  HCT 45.1 40.1 34.4* 31.5*  MCV 95.1 93.5 93.7 94.3  PLT 185 179 140* 109*   Basic Metabolic Panel: Recent Labs  Lab 07/15/2018 1426 07/29/2018 0406 08/01/18 0139 08/02/18 0405  NA 136 137 134* 132*  K 3.7 3.6 3.5 3.9  CL 96* 96* 95* 92*  CO2 31 31 24 29   GLUCOSE 279* 196* 206* 214*  BUN 28* 24* 23 25*  CREATININE 1.42* 1.48* 1.56* 1.57*  CALCIUM 8.4* 8.5* 8.2* 8.1*   GFR: Estimated Creatinine  Clearance: 35.3 mL/min (A) (by C-G formula based on SCr of 1.57 mg/dL (H)). Liver Function Tests: No results for input(s): AST, ALT, ALKPHOS, BILITOT, PROT, ALBUMIN in the last 168 hours. No results for input(s): LIPASE, AMYLASE in the last 168 hours. No results for input(s): AMMONIA in the last 168 hours. Coagulation Profile: Recent Labs  Lab 07/26/2018 1426  INR 1.11   Cardiac Enzymes: No results for input(s): CKTOTAL, CKMB, CKMBINDEX, TROPONINI in the last 168 hours. BNP (last 3 results) No results for input(s): PROBNP in the last 8760 hours. HbA1C: No results for input(s): HGBA1C in the last 72 hours. CBG: Recent Labs  Lab 08/01/18 1127 08/01/18 1619 08/01/18 2006 08/02/18 0018 08/02/18 0428  GLUCAP 213* 263* 238* 228* 187*   Lipid Profile: No results for input(s): CHOL, HDL, LDLCALC, TRIG, CHOLHDL, LDLDIRECT in the last 72 hours. Thyroid Function Tests: No results for input(s): TSH, T4TOTAL, FREET4, T3FREE, THYROIDAB in the last 72 hours. Anemia Panel: No results for input(s): VITAMINB12, FOLATE, FERRITIN, TIBC, IRON, RETICCTPCT in the last 72 hours. Urine analysis:    Component Value Date/Time   COLORURINE YELLOW 05/17/2018 1819   APPEARANCEUR CLEAR 05/17/2018 1819   LABSPEC 1.009 05/17/2018 1819   PHURINE 5.0 05/17/2018 1819   GLUCOSEU >=500 (A) 05/17/2018 1819   HGBUR NEGATIVE 05/17/2018 1819   BILIRUBINUR NEGATIVE 05/17/2018 1819   KETONESUR NEGATIVE 05/17/2018 1819   PROTEINUR NEGATIVE 05/17/2018 1819   UROBILINOGEN 0.2 12/09/2010 1650   NITRITE NEGATIVE 05/17/2018 1819   LEUKOCYTESUR NEGATIVE 05/17/2018 1819   Sepsis Labs: @LABRCNTIP (procalcitonin:4,lacticidven:4)  ) Recent Results (from the past 240 hour(s))  Surgical pcr screen     Status: None   Collection Time: 07/13/2018  6:50 PM  Result Value Ref Range Status   MRSA, PCR NEGATIVE NEGATIVE Final   Staphylococcus aureus NEGATIVE NEGATIVE Final    Comment: (NOTE) The Xpert SA Assay (FDA approved  for NASAL specimens in patients 63 years of age and older), is one component of a comprehensive surveillance program. It is not intended to diagnose infection nor to guide or monitor treatment. Performed at [facility], [place] [address], [place], [place] [address]          Radiology Studies: No results found.      Scheduled Meds: . amiodarone  200 mg Oral Q M,W,F  . apixaban  5 mg Oral BID  . arformoterol  15 mcg Nebulization BID  . atorvastatin  20 mg Oral QHS  . budesonide (PULMICORT) nebulizer solution  0.5 mg Nebulization BID  . chlorhexidine  15 mL Mouth Rinse BID  . diltiazem  120 mg Oral Daily  . furosemide  40 mg Intravenous BID  . guaiFENesin  1,200 mg Oral BID  . insulin aspart  0-9 Units Subcutaneous Q4H  . insulin detemir  12 Units Subcutaneous BID  . ipratropium-albuterol  3 mL Nebulization TID  . levothyroxine  25 mcg Oral QAC breakfast  . loratadine  10 mg Oral Daily  . mouth rinse  15 mL Mouth Rinse q12n4p  . oxybutynin  5 mg Oral QHS  . polyethylene glycol  17 g Oral Daily  . potassium chloride  40 mEq Oral BID  . predniSONE  10 mg Oral Q breakfast  . senna-docusate  1 tablet Oral BID  . sodium chloride HYPERTONIC  4 mL Nebulization Once   Continuous Infusions:    LOS: 3 days    Time spent: 3min    Domenic Polite, MD [facility]  08/02/2018, 10:52 AM

## 2018-08-02 NOTE — Clinical Social Work Note (Signed)
Clinical Social Work Assessment  Patient Details  Name: [name] MRN: [id number] Date of Birth: 1924/10/19  Date of referral:  08/02/18               Reason for consult:  Facility Placement                Permission sought to share information with:  Facility Sport and exercise psychologist, Family Supports Permission granted to share information::  Yes, Verbal Permission Granted  Name::     Malachi Bonds  Agency::  SNF  Relationship::  Daughters  Contact Information:     Housing/Transportation Living arrangements for the past 2 months:  Single Family Home Source of Information:  Patient, Medical Team, Adult Children Patient Interpreter Needed:  None Criminal Activity/Legal Involvement Pertinent to Current Situation/Hospitalization:  No - Comment as needed Significant Relationships:  Adult Children Lives with:  Self Do you feel safe going back to the place where you live?  Yes Need for family participation in patient care:  Yes (Comment)  Care giving concerns:  Patient from home, but will need short term rehab at discharge.   Social Worker assessment / plan:  CSW met with patient and son and daughter at bedside to discuss recommendation for SNF. Patient and family discussed awareness that patient was recommended for SNF, and hopeful to get him in to the [facility] in [place]. Patient indicated that his daughter had already called and was told that they would hopefully have a bed for him. CSW completed referral and faxed to [facility]. CSW discussed with patient's family insurance coverage as they couldn't remember from his last placement.   Employment status:  Retired Nurse, adult PT Recommendations:  [place] / Referral to community resources:  [place]  Patient/Family's Response to care:  Patient agreeable to SNF.  Patient/Family's Understanding of and Emotional Response to Diagnosis, Current Treatment,  and Prognosis:  Patient aware that he is not safe to go back home, but he is not happy about it. Patient wants to be independent again and be able to go back home and take care of himself. Patient knows that he will need a short rehab stay in order to make that happen. Patient's family supportive and involved in patient's care, appreciative of CSW assistance.   Emotional Assessment Appearance:  Appears stated age Attitude/Demeanor/Rapport:  Engaged Affect (typically observed):  Pleasant Orientation:  Oriented to Self, Oriented to Place, Oriented to  Time, Oriented to Situation Alcohol / Substance use:  Not Applicable Psych involvement (Current and /or in the community):  No (Comment)  Discharge Needs  Concerns to be addressed:  Care Coordination Readmission within the last 30 days:  No Current discharge risk:  Lives alone, Dependent with Mobility Barriers to Discharge:  Ship broker, Continued Medical Work up   [employer], [place] 08/02/2018, 12:09 PM

## 2018-08-02 NOTE — Progress Notes (Signed)
Subjective: 2 Days Post-Op Procedure(s) (LRB): INTRAMEDULLARY (IM) NAIL INTERTROCHANTRIC LEFT HIP (Left)   Patient resting comfortably in bed with family at bedside. He states that he does not have much hip pain as long as he is resting. HE has not gotten up much yet as he has other significant health problems that are being worked on.  Activity level:  wbat Diet tolerance:  ok Voiding:  ok Patient reports pain as mild.    Objective: Vital signs in last 24 hours: Temp:  [97.6 F (36.4 C)-98.5 F (36.9 C)] 98.5 F (36.9 C) (02/23 0420) Pulse Rate:  [71-99] 71 (02/23 0420) Resp:  [20-21] 20 (02/22 1129) BP: (97-107)/(63-68) 102/68 (02/23 0420) SpO2:  [87 %-100 %] 87 % (02/23 0841)  Labs: Recent Labs    07/21/2018 1426 07/15/2018 0406 08/01/18 0139 08/02/18 0405  HGB 14.1 12.8* 10.9* 10.0*   Recent Labs    08/01/18 0139 08/02/18 0405  WBC 13.2* 9.6  RBC 3.67* 3.34*  HCT 34.4* 31.5*  PLT 140* 141*   Recent Labs    08/01/18 0139 08/02/18 0405  NA 134* 132*  K 3.5 3.9  CL 95* 92*  CO2 24 29  BUN 23 25*  CREATININE 1.56* 1.57*  GLUCOSE 206* 214*  CALCIUM 8.2* 8.1*   Recent Labs    07/15/2018 1426  INR 1.11    Physical Exam:  Neurologically intact ABD soft Neurovascular intact Sensation intact distally Intact pulses distally Dorsiflexion/Plantar flexion intact Incision: dressing C/D/I and scant drainage No cellulitis present Compartment soft  Assessment/Plan:  2 Days Post-Op Procedure(s) (LRB): INTRAMEDULLARY (IM) NAIL INTERTROCHANTRIC LEFT HIP (Left) Advance diet Up with therapy Discharge to SNF once cleared by medicine team and PT. We greatly appreciate medical management.  Continue WBAT and baseline eliquis. Follow up in office 2 weeks post op with Dr. Tamera Punt.    Larwance Sachs Aubert Choyce 08/02/2018, 8:51 AM

## 2018-08-02 NOTE — Progress Notes (Signed)
Dressings to skin tears of BUE removed, sites cleaned and dressings replaced with vaseline guaze, abd pads and kerlix. Pt tolerated well.

## 2018-08-02 NOTE — Progress Notes (Signed)
NAME:  [name], MRN:  [id number], DOB:  July 01, 1924, LOS: 3 ADMISSION DATE:  07/19/2018, CONSULTATION DATE: July 30, 2018 REFERRING MD: Dr. Waldron Labs, CHIEF COMPLAINT: Fall, left hip fracture  Brief History   85 year old male with a past medical history significant for severe COPD fell today and broke his left hip.  Pulmonary was consulted for perioperative risk stratification.    Past Medical History  Lung cancer COPD Chronic respiratory failure with hypoxemia Obstructive sleep apnea Iron deficiency anemia DVT BPH Atrial fibrillation  Significant Hospital Events     Consults:    Procedures:    Significant Diagnostic Tests:    Micro Data:    Antimicrobials:     Interim history/subjective:  Called back to bedside for worsening dyspnea Has abdominal bloating, constipation Feels like he is full of fluid, diuresis is helping  Objective   Blood pressure 111/72, pulse 88, temperature 97.6 F (36.4 C), temperature source Oral, resp. rate 17, height 5\' 10"  (1.778 m), weight 98.4 kg, SpO2 91 %.        Intake/Output Summary (Last 24 hours) at 08/02/2018 1820 Last data filed at 08/02/2018 1813 Gross per 24 hour  Intake 240 ml  Output 625 ml  Net -385 ml   Filed Weights   07/14/2018 1403  Weight: 98.4 kg    Examination:  General:  Chronically ill appearing, resting comfortably in bed HENT: NCAT OP clear PULM: Coarse crackles bilaterally, rhonchi B, normal effort CV: RRR, no mgr GI: BS+, abdominal bloating, nontender MSK: normal bulk and tone Neuro: awake, alert, no distress, MAEW   This afternoon's CXR images personally reviewed; bilateral interstitial infiltrate, kerley B's, R pleural effusion, emphysema   Resolved Hospital Problem list     Assessment & Plan:  Left hip fracture in a patient with severe COPD and chronic respiratory failure with hypoxemia: has mild COPD exacerbation - prednisone 20 mg daily - brovana/pulmicort - duoneb prn -  pulm toilette: hyeprtonic bid overnight, flutter  Now with acute pulmonary edema, new problem, severe dyspnea - aggressive diuresis overnight with IV furosemide - place foley - change back to oral furosemide tomorrow  OSA - CPAP at night   Pulmonary will follow    Labs   CBC: Recent Labs  Lab 07/25/2018 1426 07/18/2018 0406 08/01/18 0139 08/02/18 0405  WBC 11.5* 11.6* 13.2* 9.6  NEUTROABS 9.8*  --   --   --   HGB 14.1 12.8* 10.9* 10.0*  HCT 45.1 40.1 34.4* 31.5*  MCV 95.1 93.5 93.7 94.3  PLT 185 179 140* 141*    Basic Metabolic Panel: Recent Labs  Lab 07/23/2018 1426 07/16/2018 0406 08/01/18 0139 08/02/18 0405  NA 136 137 134* 132*  K 3.7 3.6 3.5 3.9  CL 96* 96* 95* 92*  CO2 31 31 24 29   GLUCOSE 279* 196* 206* 214*  BUN 28* 24* 23 25*  CREATININE 1.42* 1.48* 1.56* 1.57*  CALCIUM 8.4* 8.5* 8.2* 8.1*   GFR: Estimated Creatinine Clearance: 35.3 mL/min (A) (by C-G formula based on SCr of 1.57 mg/dL (H)). Recent Labs  Lab 07/23/2018 1426 07/25/2018 0406 08/01/18 0139 08/02/18 0405  WBC 11.5* 11.6* 13.2* 9.6    Liver Function Tests: No results for input(s): AST, ALT, ALKPHOS, BILITOT, PROT, ALBUMIN in the last 168 hours. No results for input(s): LIPASE, AMYLASE in the last 168 hours. No results for input(s): AMMONIA in the last 168 hours.  ABG    Component Value Date/Time   PHART 7.410 05/17/2018 1742   PCO2ART  43.3 05/17/2018 1742   PO2ART 137.0 (H) 05/17/2018 1742   HCO3 27.2 05/17/2018 1742   TCO2 28 05/17/2018 1742   ACIDBASEDEF 4.0 (H) 10/06/2017 2017   O2SAT 99.0 05/17/2018 1742     Coagulation Profile: Recent Labs  Lab 08/03/2018 1426  INR 1.11    Cardiac Enzymes: No results for input(s): CKTOTAL, CKMB, CKMBINDEX, TROPONINI in the last 168 hours.  HbA1C: Hgb A1c MFr Bld  Date/Time Value Ref Range Status  09/16/2017 03:44 AM 8.2 (H) 4.8 - 5.6 % Final    Comment:    (NOTE) Pre diabetes:          5.7%-6.4% Diabetes:               >6.4% Glycemic control for   <7.0% adults with diabetes   05/08/2015 02:11 AM 7.0 (H) 4.8 - 5.6 % Final    Comment:    (NOTE)         Pre-diabetes: 5.7 - 6.4         Diabetes: >6.4         Glycemic control for adults with diabetes: <7.0     CBG: Recent Labs  Lab 08/02/18 0018 08/02/18 0428 08/02/18 0830 08/02/18 1201 08/02/18 1556  GLUCAP 228* 187* 196* 222* 290*    Review of Systems:   Gen: Denies fever, chills, weight change, + fatigue, night sweats HEENT: Denies blurred vision, double vision, hearing loss, tinnitus, sinus congestion, rhinorrhea, sore throat, neck stiffness, dysphagia PULM worsenign dyspnea todayI CV: Denies chest pain, edema, orthopnea, paroxysmal nocturnal dyspnea, palpitations GI: Denies abdominal pain, nausea, vomiting, diarrhea, hematochezia, melena, change in bowel habits, definite bloating and constipation    Past Medical History  He,  has a past medical history of Acute diastolic CHF (congestive heart failure) ([place]), Allergic conjunctivitis, Allergic rhinitis, Atrial fibrillation ([place]), BPH (benign prostatic hyperplasia), Cataract, Cellulitis, COPD (chronic obstructive pulmonary disease) ([place]), Diabetic peripheral neuropathy ([place]), Diverticulosis, DJD (degenerative joint disease), DVT (deep venous thrombosis) ([place]), Hearing loss, Hyperlipidemia, Hypertension, Iron deficiency anemia (03/27/2016), Iron deficiency anemia due to chronic blood loss (03/27/2016), Lung cancer ([place]), Malabsorption of iron (03/27/2016), Obstructive sleep apnea (adult)  (10/29/2012), Pneumonia, and Secondary diabetes with peripheral neuropathy ([place]) (11/07/2012).   Surgical History    Past Surgical History:  Procedure Laterality Date  . CARDIAC CATHETERIZATION  2006  . deviated septum repair    . FIBEROPTIC BRONCHOSCOPY  12/11/2010  . Insertion of a left subclavian Port-A-Cath.  12/17/10   [place]  . INTRAMEDULLARY (IM) NAIL INTERTROCHANTERIC Left 07/18/2018   Procedure:  INTRAMEDULLARY (IM) NAIL INTERTROCHANTRIC LEFT HIP;  Surgeon: Tania Ade, MD;  Location: [facility];  Service: Orthopedics;  Laterality: Left;  . PORTACATH PLACEMENT  04/23/2011   Procedure: INSERTION PORT-A-CATH;  Surgeon: Pierre Bali, MD;  Location: [facility];  Service: Thoracic;;  Revision of  Porta-Cath  . VIDEO BRONCHOSCOPY  09/27/2011   Procedure: VIDEO BRONCHOSCOPY;  Surgeon: Nicanor Alcon, MD;  Location: [facility];  Service: Thoracic;  Laterality: N/A;     Social History   reports that he quit smoking about 26 years ago. His smoking use included cigarettes. He started smoking about 75 years ago. He has a 50.00 pack-year smoking history. He has never used smokeless tobacco. He reports that he does not drink alcohol or use drugs.   Family History   His family history includes Cancer in his unknown relative; Coronary artery disease in his unknown relative; Diabetes type II in his unknown relative; Multiple sclerosis in his  unknown relative. There is no history of Anesthesia problems, Hypotension, Malignant hyperthermia, or Pseudochol deficiency.   Allergies Allergies  Allergen Reactions  . Tape Other (See Comments)    SKIN IS FRAGILE AND WILL TEAR EASILY!!!!     Home Medications  Prior to Admission medications   Medication Sig Start Date End Date Taking? Authorizing Provider  acetaminophen (TYLENOL) 325 MG tablet Take 650 mg by mouth every 4 (four) hours as needed for headache (chills/low grade fever).   Yes [provider]  albuterol (PROVENTIL) (2.5 MG/3ML) 0.083% nebulizer solution Take 3 mLs (2.5 mg total) by nebulization 3 (three) times daily. 3 times daily times 5 days then every 6 hours as needed. Patient taking differently: Take 2.5 mg by nebulization See admin instructions. Nebulize 2.5 mg one to two times a day as needed for shortness of breath or wheezing 09/16/17  Yes Eugenie Filler, MD  amiodarone (PACERONE) 200 MG tablet Take one tablet (200 mg) by mouth on  Monday, Wednesday, Friday mornings, may also take one tablet (200 mg) as needed for palpitations Patient taking differently: Take 200 mg by mouth See admin instructions. Take one tablet (200 mg) by mouth on Monday, Wednesday, Friday mornings, may also take one tablet (200 mg) as needed for palpitations 07/08/18  Yes Jacolyn Reedy, MD  apixaban (ELIQUIS) 5 MG TABS tablet Take 1 tablet (5 mg total) by mouth 2 (two) times daily. 11/12/12  Yes Jacolyn Reedy, MD  atorvastatin (LIPITOR) 20 MG tablet Take 20 mg by mouth at bedtime.  03/05/17  Yes [provider]  cetirizine (ZYRTEC) 10 MG tablet Take 10 mg by mouth daily.    Yes [provider]  diltiazem (CARDIZEM CD) 120 MG 24 hr capsule Take 120 mg by mouth daily.   Yes [provider]  Fluticasone-Umeclidin-Vilant (TRELEGY ELLIPTA) 100-62.5-25 MCG/INH AEPB Inhale 1 puff into the lungs daily. 07/21/18  Yes Collene Gobble, MD  furosemide (LASIX) 40 MG tablet Take 40-80 mg by mouth See admin instructions. Take 80 mg by mouth in the morning and an additional 40 mg if needed for fluid or edema   Yes [provider]  glucosamine-chondroitin 500-400 MG tablet Take 1 tablet by mouth 2 (two) times daily with a meal.    Yes [provider]  guaiFENesin (MUCINEX) 600 MG 12 hr tablet Take 1 tablet (600 mg total) by mouth 2 (two) times daily as needed for cough or to loosen phlegm. 05/20/18  Yes Debbe Odea, MD  Insulin Detemir (LEVEMIR FLEXTOUCH) 100 UNIT/ML Pen Inject 14-24 Units into the skin See admin instructions. Inject 24 units subcutaneously before breakfast and 14 units at bedtime   Yes [provider]  insulin lispro (HUMALOG) 100 UNIT/ML injection Inject 5-7 Units into the skin 3 (three) times daily as needed for high blood sugar (BGL 150-250 = 6 units and >250 = 7 units).    Yes [provider]  ipratropium-albuterol (DUONEB) 0.5-2.5 (3) MG/3ML SOLN Take 3 mLs by nebulization every 6 (six)  hours as needed. Patient taking differently: Take 3 mLs by nebulization every 6 (six) hours as needed (for shortness of breath or wheezing).  10/09/17  Yes Amin, Jeanella Flattery, MD  levothyroxine (SYNTHROID, LEVOTHROID) 25 MCG tablet Take 25 mcg by mouth daily before breakfast.   Yes [provider]  metFORMIN (GLUCOPHAGE-XR) 500 MG 24 hr tablet Take 500 mg by mouth 2 (two) times daily after a meal.  07/25/16  Yes [provider]  oxybutynin (DITROPAN) 5 MG tablet Take 5 mg by mouth at bedtime.    Yes [provider]  Polyvinyl Alcohol-Povidone (REFRESH OP) Place 1 drop into both eyes at bedtime.    Yes [provider]  predniSONE (DELTASONE) 10 MG tablet Take 10 mg by mouth daily with breakfast.   Yes [provider]  PROAIR HFA 108 (90 Base) MCG/ACT inhaler INHALE TWO PUFFS BY MOUTH EVERY 6 HOURS AS NEEDED FOR WHEEZING AND FOR SHORTNESS OF BREATH Patient taking differently: Inhale 2 puffs into the lungs every 6 (six) hours as needed for wheezing or shortness of breath.  04/10/17  Yes Byrum, Rose Fillers, MD  temazepam (RESTORIL) 15 MG capsule TAKE ONE CAPSULE BY MOUTH AT BEDTIME AS NEEDED FOR SLEEP Patient taking differently: Take 15 mg by mouth at bedtime.  02/02/14  Yes Volanda Napoleon, MD  glucose blood (ONETOUCH VERIO) test strip  09/23/17   [provider]    Family updated bedside Discussed with nursing, RT  Roselie Awkward, MD [facility] Pager: [phone] Cell: [phone] If no response, call [phone]

## 2018-08-03 DIAGNOSIS — J9811 Atelectasis: Secondary | ICD-10-CM

## 2018-08-03 DIAGNOSIS — R1310 Dysphagia, unspecified: Secondary | ICD-10-CM

## 2018-08-03 DIAGNOSIS — R0902 Hypoxemia: Secondary | ICD-10-CM

## 2018-08-03 DIAGNOSIS — J810 Acute pulmonary edema: Secondary | ICD-10-CM

## 2018-08-03 LAB — CBC
HCT: 29.4 % — ABNORMAL LOW (ref 39.0–52.0)
Hemoglobin: 9.4 g/dL — ABNORMAL LOW (ref 13.0–17.0)
MCH: 29.5 pg (ref 26.0–34.0)
MCHC: 32 g/dL (ref 30.0–36.0)
MCV: 92.2 fL (ref 80.0–100.0)
Platelets: 170 10*3/uL (ref 150–400)
RBC: 3.19 MIL/uL — ABNORMAL LOW (ref 4.22–5.81)
RDW: 13.8 % (ref 11.5–15.5)
WBC: 5.3 10*3/uL (ref 4.0–10.5)
nRBC: 0.4 % — ABNORMAL HIGH (ref 0.0–0.2)

## 2018-08-03 LAB — BASIC METABOLIC PANEL
Anion gap: 10 (ref 5–15)
BUN: 35 mg/dL — ABNORMAL HIGH (ref 8–23)
CALCIUM: 8.2 mg/dL — AB (ref 8.9–10.3)
CO2: 29 mmol/L (ref 22–32)
CREATININE: 1.69 mg/dL — AB (ref 0.61–1.24)
Chloride: 93 mmol/L — ABNORMAL LOW (ref 98–111)
GFR calc non Af Amer: 34 mL/min — ABNORMAL LOW (ref >60)
GFR, EST AFRICAN AMERICAN: 40 mL/min — AB (ref >60)
Glucose, Bld: 131 mg/dL — ABNORMAL HIGH (ref 70–99)
Potassium: 4.4 mmol/L (ref 3.5–5.1)
Sodium: 132 mmol/L — ABNORMAL LOW (ref 135–145)

## 2018-08-03 LAB — GLUCOSE, CAPILLARY
Glucose-Capillary: 117 mg/dL — ABNORMAL HIGH (ref 70–99)
Glucose-Capillary: 127 mg/dL — ABNORMAL HIGH (ref 70–99)
Glucose-Capillary: 127 mg/dL — ABNORMAL HIGH (ref 70–99)
Glucose-Capillary: 137 mg/dL — ABNORMAL HIGH (ref 70–99)
Glucose-Capillary: 142 mg/dL — ABNORMAL HIGH (ref 70–99)
Glucose-Capillary: 165 mg/dL — ABNORMAL HIGH (ref 70–99)

## 2018-08-03 MED ORDER — FUROSEMIDE 40 MG PO TABS
40.0000 mg | ORAL_TABLET | Freq: Every day | ORAL | Status: DC
  Administered 2018-08-03 – 2018-08-06 (×3): 40 mg via ORAL
  Filled 2018-08-03 (×4): qty 1

## 2018-08-03 MED ORDER — APIXABAN 2.5 MG PO TABS
2.5000 mg | ORAL_TABLET | Freq: Two times a day (BID) | ORAL | Status: DC
  Administered 2018-08-03 – 2018-08-06 (×6): 2.5 mg via ORAL
  Filled 2018-08-03 (×6): qty 1

## 2018-08-03 MED ORDER — FUROSEMIDE 10 MG/ML IJ SOLN: 40.0000 mg | Freq: Every day | INTRAMUSCULAR | Status: DC

## 2018-08-03 NOTE — Care Management Important Message (Signed)
Important Message  Patient Details  Name: [name] MRN: [id number] Date of Birth: 1924-07-06   Medicare Important Message Given:  Yes    Orbie Pyo 08/03/2018, 4:26 PM

## 2018-08-03 NOTE — Progress Notes (Signed)
CSW spoke with family at bedside who confirms their SNF choice of [facility].   [facility] initiated insurance auth with [facility] today.   Donna, [place]

## 2018-08-03 NOTE — Progress Notes (Signed)
Physical Therapy Evaluation Patient Details Name: [name] MRN: [id number] DOB: [birth year] Today's Date: 08/03/2018   History of Present Illness  85 y.o. 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, cancer free, alos has HTN, DJD peripheral neuropathy   PT Comments   Continuing work on functional mobility and activity tolerance; Session focused on giving [name] options for transfers, and increase standing tolerance in a stable situation, while accommodating pulmonary status; Used Clarise Cruz stedy for OOB to recliner transfer;  overall tolerated tranfsers well on 8 L supplemental O2 via high flow [place]; continue to recommend post-acute rehabilitation     Follow Up Recommendations SNF    Equipment Recommendations  Rolling walker with 5" wheels;3in1 (PT)    Recommendations for Other Services       Precautions / Restrictions Precautions Precautions: Fall Restrictions Weight Bearing Restrictions: Yes LLE Weight Bearing: Weight bearing as tolerated      Mobility  Bed Mobility Overal bed mobility: Needs Assistance Bed Mobility: Supine to Sit     Supine to sit: Mod assist;+2 for physical assistance     General bed mobility comments: moderate assistance to assist left LE out of the bed and to scoot patients hip to the edge of the bed. Mod cuing for hand placement; very painful moving  Transfers Overall transfer level: Needs assistance   Transfers: Sit to/from Stand Sit to Stand: Max assist;+2 physical assistance(and Min assist to stand from higher Stedy seat)         General transfer comment: Opted to stand to Denna Haggard today to get more proactice in standing in a more stable situation, and accomodate for decr activity tolerance; Max assist to stand from slightly elevated bed, limited by pain; min assist to stand from elevated Stedy  seat; heavy mod assist to cotrol descent to sitting in recliner  Ambulation/Gait             General Gait Details: did not perform today  Stairs            Wheelchair Mobility    Modified Rankin (Stroke Patients Only)       Balance     Sitting balance-Leahy Scale: Fair       Standing balance-Leahy Scale: Poor                               Pertinent Vitals/Pain Pain Assessment: Faces Pain Score: 8 (while standing) Faces Pain Scale: Hurts little more(while at rest) Pain Location: left hip  Pain Descriptors / Indicators: Aching;Guarding Pain Intervention(s): Monitored during session    Home Living                        Prior Function                 Hand Dominance        Extremity/Trunk Assessment                Communication      Cognition Arousal/Alertness: Awake/alert Behavior During Therapy: WFL for tasks assessed/performed Overall Cognitive Status: Within Functional Limits for tasks assessed  General Comments General comments (skin integrity, edema, etc.): Session conducted on 8 L supplemental O2 via high flow [place]; At times of movement (or breath holding with movement or anticipation of pain), he did desat to low 80s, but recovered to low to mid 90s relatively quickly    Exercises     Assessment/Plan    PT Assessment Patient needs continued PT services  PT Problem List Decreased strength;Decreased range of motion;Decreased activity tolerance;Decreased balance;Decreased mobility;Decreased coordination;Decreased knowledge of use of DME;Decreased safety awareness;Decreased knowledge of precautions;Cardiopulmonary status limiting activity;Pain;Decreased skin integrity       PT Treatment Interventions DME instruction;Gait training;Functional mobility training;Therapeutic activities;Therapeutic exercise;Balance training;Patient/family education    PT Goals  (Current goals can be found in the Care Plan section)  Acute Rehab PT Goals Patient Stated Goal: to have less pain  PT Goal Formulation: With patient Time For Goal Achievement: 08/01/18 Potential to Achieve Goals: Good    Frequency Min 2X/week   Barriers to discharge        Co-evaluation               AM-PAC PT "6 Clicks" Mobility  Outcome Measure Help needed turning from your back to your side while in a flat bed without using bedrails?: A Lot Help needed moving from lying on your back to sitting on the side of a flat bed without using bedrails?: A Lot Help needed moving to and from a bed to a chair (including a wheelchair)?: Total Help needed standing up from a chair using your arms (e.g., wheelchair or bedside chair)?: A Lot Help needed to walk in hospital room?: Total Help needed climbing 3-5 steps with a railing? : Total 6 Click Score: 9    End of Session Equipment Utilized During Treatment: Gait belt Activity Tolerance: Patient limited by pain Patient left: in chair;with call bell/phone within reach Nurse Communication: Mobility status PT Visit Diagnosis: Unsteadiness on feet (R26.81);Other abnormalities of gait and mobility (R26.89);History of falling (Z91.81)    Time: 2023-3435 PT Time Calculation (min) (ACUTE ONLY): 31 min   Charges:     PT Treatments $Therapeutic Activity: 23-37 mins $Neuromuscular Re-education: 23-37 mins        Roney Marion, PT  Acute Rehabilitation Services Pager [phone] Office [place] 08/03/2018, 2:26 PM

## 2018-08-03 NOTE — Plan of Care (Signed)
  Problem: Pain Managment: Goal: General experience of comfort will improve Outcome: Progressing   Problem: Safety: Goal: Ability to remain free from injury will improve Outcome: Progressing   

## 2018-08-03 NOTE — Progress Notes (Signed)
Pt AAOX4, slightly HOH. Family at the bedside supportive. Noted pt with coarse BS and rhonchi scattered thru out lung fields. Pox sat at 90-93% on 10L HFNC. Productive cough, small amt of thick white sputum. RRT at the bedside to adm nebs and assess pt. Pt achieves 1250 on [employer], and encouraged to use flutter valve. RRT made aware of sch. lasix IV orders. Foley to gravity with yellow colored urine. BUE with skin tears, dsg C/D/I. L chest port accessed, LUE with diabetic disc intact. Pt with c/o L hip pain rated 9/10. Prn given, see Mar. Follow up pain rated 3/10. L hip with 2 Mepilex dsgs intact, old drainage marked. RTT in to place pt on CPAP. Tele SR 89. Call light and phone in reach, rounds continued per unit policy and MD orders.

## 2018-08-03 NOTE — Plan of Care (Signed)
  Problem: Clinical Measurements: Goal: Ability to maintain clinical measurements within normal limits will improve Outcome: Progressing   Problem: Activity: Goal: Risk for activity intolerance will decrease Outcome: Progressing   Problem: Elimination: Goal: Will not experience complications related to bowel motility Outcome: Progressing   Problem: Pain Managment: Goal: General experience of comfort will improve Outcome: Progressing   Problem: Safety: Goal: Ability to remain free from injury will improve Outcome: Progressing   Problem: Skin Integrity: Goal: Risk for impaired skin integrity will decrease Outcome: Progressing

## 2018-08-03 NOTE — Progress Notes (Signed)
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 -Orthopedics consulted, underwent left hip intramedullary nail 2/21  Assessment & Plan:   Left hip fracture -Secondary to mechanical fall,  -Ortho following, underwent left hip intramedullary nailing yesterday 2/21 -Eliquis resumed postop -Continue physical therapy -Plan for short-term rehab, social work consulted  Acute on chronic diastolic congestive heart failure  -Echo on 10/2017 with ejection fraction 55 to 85% and mild diastolic dysfunction,  -Was diuresed with IV Lasix for the last 2 days, does not appear overtly volume overloaded, chest x-ray not suggestive of interstitial edema -BP too soft to tolerate diuresis at this time -monitor I/O, Bmet  Acute on chronic hypoxic respiratory failure -At baseline has advanced COPD and 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 fib with controlled rate -Eliquis held for  surgery, restarted -Heart rate controlled, continue Cardizem CD and amiodarone   Hypothyroidism - cont with synthroid  CKD stage III -Patent at baseline, continue to monitor closely  Diabetes mellitus type II  - Hold metformin , CBGs improving but still high, continue Levemir dose increased back to 17 units twice daily   Hyperlipidemia; continue with Lipitor  DVT Prophylaxis : Eliquis resumed  Full code Family communication: Daughter at bedside Disposition: SNF in 48hours if stable   Consults called: PCCM, ortho    Procedures:   Antimicrobials:    Subjective: -Eating a little better today, still with very weak cough and some congestion  Objective: Vitals:   08/03/18 0450 08/03/18 0800 08/03/18 0907 08/03/18 0910  BP: 94/66 (!) 88/63    Pulse: 78 75 75   Resp:   19   Temp: 97.7 F (36.5 C)     TempSrc: Axillary     SpO2: 100%  100% 100%  Weight:      Height:        Intake/Output Summary (Last 24 hours) at 08/03/2018 1338 Last data filed at 08/03/2018 1300 Gross per 24 hour  Intake 660 ml  Output 1775 ml  Net -1115 ml   Filed Weights   08/02/2018 1403  Weight: 98.4 kg    Examination:  Gen: Awake, Alert, Oriented X 3, frail chronically ill-appearing male, sitting up in bed, no distress HEENT: PERRLA, Neck supple, no JVD Lungs: Few scattered rhonchi throughout both lung fields, few basilar crackles as well, no wheezing CVS: RRR,No Gallops,Rubs or new Murmurs Abd: soft, Non tender, non distended, BS present Extremities: No edema, left hip with dressing Skin: no new rashes  Psychiatry: Judgement and insight appear normal. Mood & affect appropriate.     Data Reviewed:   CBC: Recent Labs  Lab 07/21/2018 1426 07/26/2018 0406 08/01/18 0139 08/02/18 0405 08/03/18 0303  WBC 11.5* 11.6* 13.2* 9.6 5.3  NEUTROABS 9.8*  --   --   --   --   HGB 14.1 12.8* 10.9* 10.0* 9.4*  HCT 45.1 40.1 34.4* 31.5* 29.4*  MCV 95.1 93.5 93.7 94.3 92.2  PLT 185 179  140* 141* 633   Basic Metabolic Panel: Recent Labs  Lab 07/26/2018 1426 07/19/2018 0406 08/01/18 0139 08/02/18 0405 08/03/18 0303  NA 136 137 134* 132* 132*  K 3.7 3.6 3.5 3.9 4.4  CL 96* 96* 95* 92* 93*  CO2 31 31 24 29 29   GLUCOSE 279* 196* 206* 214* 131*  BUN 28* 24* 23 25* 35*  CREATININE 1.42* 1.48* 1.56* 1.57* 1.69*  CALCIUM 8.4* 8.5* 8.2* 8.1* 8.2*   GFR: Estimated Creatinine Clearance: 32.8 mL/min (A) (by C-G formula based on SCr of 1.69 mg/dL (H)). Liver Function Tests: No results for input(s): AST, ALT, ALKPHOS, BILITOT, PROT, ALBUMIN in the last 168 hours. No results for input(s): LIPASE, AMYLASE in the last 168 hours. No results for input(s): AMMONIA in the last 168 hours. Coagulation Profile: Recent Labs  Lab 07/21/2018 1426  INR 1.11   Cardiac Enzymes: No results for input(s): CKTOTAL, CKMB, CKMBINDEX, TROPONINI in the last 168 hours. BNP (last 3 results) No results for input(s): PROBNP in the last 8760 hours. HbA1C: No results for input(s): HGBA1C in the last 72 hours. CBG: Recent Labs  Lab 08/02/18 1938 08/02/18 2352 08/03/18 0452 08/03/18 0738 08/03/18 1208  GLUCAP 260* 218* 117* 127* 142*   Lipid Profile: No results for input(s): CHOL, HDL, LDLCALC, TRIG, CHOLHDL, LDLDIRECT in the last 72 hours. Thyroid Function Tests: No results for input(s): TSH, T4TOTAL, FREET4, T3FREE, THYROIDAB in the last 72 hours. Anemia Panel: No results for input(s): VITAMINB12, FOLATE, FERRITIN, TIBC, IRON, RETICCTPCT in the last 72 hours. Urine analysis:    Component Value Date/Time   COLORURINE YELLOW 05/17/2018 1819   APPEARANCEUR CLEAR 05/17/2018 1819   LABSPEC 1.009 05/17/2018 1819   PHURINE 5.0 05/17/2018 1819   GLUCOSEU >=500 (A) 05/17/2018 1819   HGBUR NEGATIVE 05/17/2018 1819   BILIRUBINUR NEGATIVE 05/17/2018 1819   KETONESUR NEGATIVE 05/17/2018 1819   PROTEINUR NEGATIVE 05/17/2018 1819   UROBILINOGEN 0.2 12/09/2010 1650   NITRITE NEGATIVE 05/17/2018  1819   LEUKOCYTESUR NEGATIVE 05/17/2018 1819   Sepsis Labs: @LABRCNTIP (procalcitonin:4,lacticidven:4)  ) Recent Results (from the past 240 hour(s))  Surgical pcr screen     Status: None   Collection Time: 07/14/2018  6:50 PM  Result Value Ref Range Status   MRSA, PCR NEGATIVE NEGATIVE Final   Staphylococcus aureus NEGATIVE NEGATIVE Final    Comment: (NOTE) The Xpert SA Assay (FDA approved for NASAL specimens in patients 30 years of age and older), is one component of a comprehensive surveillance program. It is not intended to diagnose infection nor to guide or monitor treatment. Performed at [facility], [place] [address] [place],  [address]          Radiology Studies: Dg Chest 2 View  Result Date: 08/02/2018 CLINICAL DATA:  Hypoxia EXAM: CHEST - 2 VIEW COMPARISON:  08/05/2018 chest radiograph. FINDINGS: Stable configuration of left subclavian Port-A-Cath. Stable cardiomediastinal silhouette with top-normal heart size. No pneumothorax. Stable small right pleural effusion. No significant left pleural effusion. No overt pulmonary edema. Bilateral retrocardiac  opacity appears stable. IMPRESSION: 1. Stable small right pleural effusion. 2. Stable bilateral retrocardiac opacity, favor atelectasis. Electronically Signed   By: Ilona Sorrel M.D.   On: 08/02/2018 17:38        Scheduled Meds: . amiodarone  200 mg Oral Q M,W,F  . apixaban  2.5 mg Oral BID  . arformoterol  15 mcg Nebulization BID  . atorvastatin  20 mg Oral QHS  . budesonide (PULMICORT) nebulizer solution  0.5 mg Nebulization BID  . chlorhexidine  15 mL Mouth Rinse BID  . diltiazem  120 mg Oral Daily  . furosemide  40 mg Oral Daily  . guaiFENesin  1,200 mg Oral BID  . insulin aspart  0-9 Units Subcutaneous Q4H  . insulin detemir  17 Units Subcutaneous BID  . levothyroxine  25 mcg Oral QAC breakfast  . loratadine  10 mg Oral Daily  . mouth rinse  15 mL Mouth Rinse q12n4p  . oxybutynin  5 mg Oral QHS    . polyethylene glycol  17 g Oral Daily  . predniSONE  10 mg Oral Q breakfast  . senna-docusate  1 tablet Oral BID   Continuous Infusions:    LOS: 4 days    Time spent: 15min    Domenic Polite, MD [facility]  08/03/2018, 1:38 PM

## 2018-08-03 NOTE — Discharge Instructions (Signed)

## 2018-08-03 NOTE — Progress Notes (Addendum)
NAME:  DEKLIN BIELER, MRN:  [id number], DOB:  [birth year], LOS: 4 ADMISSION DATE:  07/16/2018, CONSULTATION DATE: July 30, 2018 REFERRING MD: Dr. Waldron Labs, CHIEF COMPLAINT: Fall, left hip fracture  Brief History   85 year old male with a past medical history significant for severe COPD fell today and broke his left hip.  Pulmonary was consulted for perioperative risk stratification.  Past Medical History  Lung cancer COPD Chronic respiratory failure with hypoxemia Obstructive sleep apnea Iron deficiency anemia DVT BPH Atrial fibrillation  Significant Hospital Events   2/20 Admit with fall 2/23 PCCM called back for resp distress   Consults:  PCCM   Procedures:    Significant Diagnostic Tests:    Micro Data:    Antimicrobials:     Interim history/subjective:  Wore CPAP overnight.  O2 at 8L (baseline 6L).  Pt working with PT.  Family at bedside.   Objective   Blood pressure (!) 88/63, pulse 75, temperature 97.7 F (36.5 C), temperature source Axillary, resp. rate 19, height 5\' 10"  (1.778 m), weight 98.4 kg, SpO2 100 %.        Intake/Output Summary (Last 24 hours) at 08/03/2018 1125 Last data filed at 08/03/2018 0900 Gross per 24 hour  Intake 420 ml  Output 1525 ml  Net -1105 ml   Filed Weights   07/23/2018 1403  Weight: 98.4 kg    Examination: General: elderly male sitting up at bedside working with PT HEENT: MM pink/moist, Honor O2 Neuro: Awake, alert, oriented, speech clear, MAE  CV: s1s2 rrr, no m/r/g PULM: even/non-labored, lungs bilaterally with bibasilar crackles, modest clearance with cough VQ:QVZD, non-tender, bsx4 active  Extremities: warm/dry, trace edema  Skin: no rashes or lesions  Resolved Hospital Problem list     Assessment & Plan:   Left hip fracture in a patient with severe COPD and chronic respiratory failure with hypoxemia with mild COPD exacerbation P: Continue prednisone 20 mg QD Brovana + Pulmicort  Duoneb PRN  Pulmonary  Hygiene - IS, flutter valve, hypertonic saline neb BID  Wean O2 back to baseline of 6L Follow up with Pulmonary at discharge (arranged) Discharge med plan > PRN duoneb / albuterol, Trelegy, zyrtec   Acute pulmonary edema, new problem, severe dyspnea P: Continue Lasix > change back to PO, 40 mg QD  Strict I/O's    OSA P: CPAP QHS     Labs   CBC: Recent Labs  Lab 07/28/2018 1426 07/18/2018 0406 08/01/18 0139 08/02/18 0405 08/03/18 0303  WBC 11.5* 11.6* 13.2* 9.6 5.3  NEUTROABS 9.8*  --   --   --   --   HGB 14.1 12.8* 10.9* 10.0* 9.4*  HCT 45.1 40.1 34.4* 31.5* 29.4*  MCV 95.1 93.5 93.7 94.3 92.2  PLT 185 179 140* 141* 638    Basic Metabolic Panel: Recent Labs  Lab 07/21/2018 1426 07/20/2018 0406 08/01/18 0139 08/02/18 0405 08/03/18 0303  NA 136 137 134* 132* 132*  K 3.7 3.6 3.5 3.9 4.4  CL 96* 96* 95* 92* 93*  CO2 31 31 24 29 29   GLUCOSE 279* 196* 206* 214* 131*  BUN 28* 24* 23 25* 35*  CREATININE 1.42* 1.48* 1.56* 1.57* 1.69*  CALCIUM 8.4* 8.5* 8.2* 8.1* 8.2*   GFR: Estimated Creatinine Clearance: 32.8 mL/min (A) (by C-G formula based on SCr of 1.69 mg/dL (H)). Recent Labs  Lab 07/27/2018 0406 08/01/18 0139 08/02/18 0405 08/03/18 0303  WBC 11.6* 13.2* 9.6 5.3    Liver Function Tests: No results for input(s): AST,  ALT, ALKPHOS, BILITOT, PROT, ALBUMIN in the last 168 hours. No results for input(s): LIPASE, AMYLASE in the last 168 hours. No results for input(s): AMMONIA in the last 168 hours.  ABG    Component Value Date/Time   PHART 7.410 05/17/2018 1742   PCO2ART 43.3 05/17/2018 1742   PO2ART 137.0 (H) 05/17/2018 1742   HCO3 27.2 05/17/2018 1742   TCO2 28 05/17/2018 1742   ACIDBASEDEF 4.0 (H) 10/06/2017 2017   O2SAT 99.0 05/17/2018 1742     Coagulation Profile: Recent Labs  Lab 07/19/2018 1426  INR 1.11    Cardiac Enzymes: No results for input(s): CKTOTAL, CKMB, CKMBINDEX, TROPONINI in the last 168 hours.  HbA1C: Hgb A1c MFr Bld  Date/Time  Value Ref Range Status  09/16/2017 03:44 AM 8.2 (H) 4.8 - 5.6 % Final    Comment:    (NOTE) Pre diabetes:          5.7%-6.4% Diabetes:              >6.4% Glycemic control for   <7.0% adults with diabetes   05/08/2015 02:11 AM 7.0 (H) 4.8 - 5.6 % Final    Comment:    (NOTE)         Pre-diabetes: 5.7 - 6.4         Diabetes: >6.4         Glycemic control for adults with diabetes: <7.0     CBG: Recent Labs  Lab 08/02/18 1556 08/02/18 1938 08/02/18 2352 08/03/18 0452 08/03/18 0738  GLUCAP 290* 260* 218* 117* 127*    PCCM will be available PRN. Please call back if new needs arise.   Noe Gens, NP-C [facility] Pgr: [phone] or if no answer [phone] 08/03/2018, 11:25 AM  Attending Note:  85 year old male with extensive PMH who presents to PCCM with acute on chronic respiratory failure with pulmonary edema and concern for aspiration.  No events overnight, able to ambulate and increase O2 to 8L from baseline of 6L.  On exam, lungs with bibasilar crackles.  I reviewed CXR myself, pulmonary edema noted.  Discussed with PCCM-NP.  Hypoxemia:  - Titrate O2 for sat of 88-92%  - May need an ambulatory desaturation study for a new baseline of O2  Acute pulmonary edema:  - Lasix as ordered  - One dose of zaroxolyn  - Strict I/O  Atelectasis:  - IS  - PT  - OOB  Dysphagia:  - SLP  - Monitor closely for airway protection  PCCM will sign off, please call back if needed.  Patient seen and examined, agree with above note.  I dictated the care and orders written for this patient under my direction.  Rush Farmer, MD [phone].

## 2018-08-03 NOTE — Progress Notes (Signed)
Placed patient on CPAP auto settings (max 20, min 6) cm H20 with 7 lpm O2 bleed in.

## 2018-08-04 ENCOUNTER — Inpatient Hospital Stay (HOSPITAL_COMMUNITY): Payer: Medicare Other

## 2018-08-04 LAB — CBC
HCT: 28.5 % — ABNORMAL LOW (ref 39.0–52.0)
Hemoglobin: 8.8 g/dL — ABNORMAL LOW (ref 13.0–17.0)
MCH: 29.7 pg (ref 26.0–34.0)
MCHC: 30.9 g/dL (ref 30.0–36.0)
MCV: 96.3 fL (ref 80.0–100.0)
Platelets: 174 10*3/uL (ref 150–400)
RBC: 2.96 MIL/uL — ABNORMAL LOW (ref 4.22–5.81)
RDW: 14 % (ref 11.5–15.5)
WBC: 7.1 10*3/uL (ref 4.0–10.5)
nRBC: 0.4 % — ABNORMAL HIGH (ref 0.0–0.2)

## 2018-08-04 LAB — BASIC METABOLIC PANEL
ANION GAP: 12 (ref 5–15)
BUN: 41 mg/dL — ABNORMAL HIGH (ref 8–23)
CHLORIDE: 94 mmol/L — AB (ref 98–111)
CO2: 26 mmol/L (ref 22–32)
Calcium: 8.1 mg/dL — ABNORMAL LOW (ref 8.9–10.3)
Creatinine, Ser: 1.79 mg/dL — ABNORMAL HIGH (ref 0.61–1.24)
GFR calc Af Amer: 37 mL/min — ABNORMAL LOW (ref >60)
GFR calc non Af Amer: 32 mL/min — ABNORMAL LOW (ref >60)
Glucose, Bld: 137 mg/dL — ABNORMAL HIGH (ref 70–99)
Potassium: 4.9 mmol/L (ref 3.5–5.1)
Sodium: 132 mmol/L — ABNORMAL LOW (ref 135–145)

## 2018-08-04 LAB — GLUCOSE, CAPILLARY
GLUCOSE-CAPILLARY: 117 mg/dL — AB (ref 70–99)
GLUCOSE-CAPILLARY: 128 mg/dL — AB (ref 70–99)
Glucose-Capillary: 107 mg/dL — ABNORMAL HIGH (ref 70–99)
Glucose-Capillary: 119 mg/dL — ABNORMAL HIGH (ref 70–99)
Glucose-Capillary: 166 mg/dL — ABNORMAL HIGH (ref 70–99)
Glucose-Capillary: 169 mg/dL — ABNORMAL HIGH (ref 70–99)
Glucose-Capillary: 221 mg/dL — ABNORMAL HIGH (ref 70–99)

## 2018-08-04 LAB — BLOOD GAS, ARTERIAL
ACID-BASE DEFICIT: 3.8 mmol/L — AB (ref 0.0–2.0)
BICARBONATE: 20.6 mmol/L (ref 20.0–28.0)
Drawn by: 54887
FIO2: 100
O2 Saturation: 97.4 %
Patient temperature: 98.6
pCO2 arterial: 36.7 mmHg (ref 32.0–48.0)
pH, Arterial: 7.368 (ref 7.350–7.450)
pO2, Arterial: 100 mmHg (ref 83.0–108.0)

## 2018-08-04 MED ORDER — NALOXONE HCL 0.4 MG/ML IJ SOLN
0.4000 mg | INTRAMUSCULAR | Status: DC | PRN
  Administered 2018-08-04: 0.4 mg via INTRAVENOUS

## 2018-08-04 MED ORDER — NALOXONE HCL 0.4 MG/ML IJ SOLN
INTRAMUSCULAR | Status: AC
  Filled 2018-08-04: qty 1

## 2018-08-04 MED ORDER — IPRATROPIUM-ALBUTEROL 0.5-2.5 (3) MG/3ML IN SOLN
3.0000 mL | Freq: Three times a day (TID) | RESPIRATORY_TRACT | Status: DC
  Administered 2018-08-04 – 2018-08-06 (×7): 3 mL via RESPIRATORY_TRACT
  Filled 2018-08-04 (×8): qty 3

## 2018-08-04 MED ORDER — NALOXONE HCL 0.4 MG/ML IJ SOLN: 0.4000 mg | Freq: Once | INTRAMUSCULAR | Status: DC

## 2018-08-04 NOTE — Progress Notes (Signed)
[facility] notified CSW they have received [facility] insurance auth. Patient's SNF bed is ready when patient is medically ready to dc.   [place], [place]

## 2018-08-04 NOTE — Progress Notes (Signed)
Report called in to [facility]. Patient transferred to [id number].

## 2018-08-04 NOTE — Progress Notes (Signed)
When rounding on patient, he was noted to have labored breathing, o2 sats in the lower 80's on 10L via nasal cannula. Patient states not feeling well. Very lethargic. Denies chest pain.  O2 switched to non-rebeather sats in the upper 90's.  Rapid response nurse, RT and MD at bedside. Family at bedside.Patient to transfer to stepdown.

## 2018-08-04 NOTE — Progress Notes (Signed)
   PATIENT ID: [name]   4 Days Post-Op Procedure(s) (LRB): INTRAMEDULLARY (IM) NAIL INTERTROCHANTRIC LEFT HIP (Left)  Subjective: Pain when moves hip, no pain at rest.  Medical team working on trying to optimize pulmonary situation.  Still requiring sig O2.  Objective:  Vitals:   08/04/18 0840 08/04/18 0849  BP: (!) 82/60   Pulse:  79  Resp:    Temp:    SpO2:       L hip dressing with sml blood.  Significant surrounding ecchymosis on hip and leg. Distally NVI.  Labs:  Recent Labs    08/02/18 0405 08/03/18 0303 08/04/18 0431  HGB 10.0* 9.4* 8.8*   Recent Labs    08/03/18 0303 08/04/18 0431  WBC 5.3 7.1  RBC 3.19* 2.96*  HCT 29.4* 28.5*  PLT 170 174   Recent Labs    08/03/18 0303 08/04/18 0431  NA 132* 132*  K 4.4 4.9  CL 93* 94*  CO2 29 26  BUN 35* 41*  CREATININE 1.69* 1.79*  GLUCOSE 131* 137*  CALCIUM 8.2* 8.1*    Assessment and Plan: POD 5 s/p IMN L hip fracture Ok for WBAT if able to mobilize with PT.  Would leave dressing on for now while in hospital. Will follow  VTE proph: Eliquis.

## 2018-08-04 NOTE — Plan of Care (Signed)
  Problem: Clinical Measurements: Goal: Ability to maintain clinical measurements within normal limits will improve Outcome: Progressing   Problem: Clinical Measurements: Goal: Will remain free from infection Outcome: Progressing   

## 2018-08-04 NOTE — Progress Notes (Signed)
PT Cancellation Note  Patient Details Name: [name] MRN: [id number] DOB: 02/11/1925   Cancelled Treatment:    Reason Eval/Treat Not Completed: Patient declined, no reason specified  Discussed pt's O2 status with RN prior to treatment session; planned to modify based on pt struggling to maintain SpO2 > 90% on 10L/min O2. Offered low level bed exercises, but pt still declined based on pain and difficulty breathing. At this point, it is worth considering Palliative Medicine consult to identify his goals of care and ensure interventions are appropriate.   Ronnell Guadalajara, SPT   Ronnell Guadalajara 08/04/2018, 3:58 PM

## 2018-08-04 NOTE — Progress Notes (Signed)
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 -Orthopedics consulted, underwent left hip intramedullary nail 2/21. -Stopped course complicated by worsening respiratory failure  Assessment & Plan:   Left hip fracture -Secondary to mechanical fall,  -Ortho following, underwent left hip intramedullary nailing 2/21 -Eliquis resumed postop -Continue physical therapy -Postop course complicated by respiratory failure, poor pulmonary toilet, very weak cough and hypoxemia -Plan for rehab when medically optimized  Acute on chronic hypoxic respiratory failure -At baseline has advanced COPD and possibly radiation fibrosis on 6 L home O2 -Now worsened in the setting of postop pain, debility, limited pulmonary reserve weak cough decreased ability to clear secretions and mild component of CHF -Diuresed aggressively with IV Lasix over the last 2 days, now blood pressures in the 80-90 range, on oral Lasix now-hold today's dose if blood pressure does not improve -Continue duo nebs 3 times daily and as needed, flutter valve and incentive spirometer use encouraged -PT, out of bed to chair -No wheezing  COPD/chronic respiratory failure -On 6 L home O2 at baseline -See above, now back on prednisone 10 mg daily  Acute on chronic diastolic congestive heart failure  -Echo on 10/2017 with ejection fraction 55 to 37% and mild diastolic dysfunction,  -Was diuresed with IV Lasix for the last 2 days, does not appear overtly  volume overloaded, chest x-ray not suggestive of interstitial edema -BP too soft to tolerate diuresis at this time -monitor I/O, Bmet  Paroxysmal A. fib with controlled rate -Eliquis held for surgery, restarted -Heart rate controlled, continue Cardizem CD and amiodarone   Hypothyroidism - cont with synthroid  CKD stage III -Patent at baseline, continue to monitor closely  Diabetes mellitus type II  - Hold metformin ,  continue Levemir dose increased back to 17 units twice daily  -CBG stable  Hyperlipidemia; continue with Lipitor  DVT Prophylaxis : Eliquis resumed  Full code Family communication: Daughter at bedside Disposition: SNF in 48hours if stable   Consults called: PCCM, ortho    Procedures:   Antimicrobials:    Subjective: -Continues to feel weak, mild dizziness, continues to have a very weak cough some congestion -No wheezing  Objective: Vitals:   08/04/18 0744 08/04/18 0750 08/04/18 0840 08/04/18 0849  BP:   (!) 82/60   Pulse:    79  Resp:      Temp:      TempSrc:      SpO2: 90% 90%    Weight:      Height:        Intake/Output Summary (Last 24 hours) at 08/04/2018 1210 Last data filed at 08/04/2018 0900 Gross per 24 hour  Intake 480 ml  Output 1200 ml  Net -720 ml   Filed Weights   07/24/2018 1403  Weight: 98.4 kg    Examination:  Gen: Awake, Alert, Oriented X 3, frail chronically ill-appearing male sitting up in bed, no distress HEENT: PERRLA, Neck supple, no JVD Lungs: Few scattered rhonchi throughout both lung fields, few basilar crackles on the left as well CVS: RRR,No Gallops,Rubs  or new Murmurs Abd: soft, Non tender, non distended, BS present Extremities: No edema, left hip with dressing Skin: no new rashes Psychiatry: Judgement and insight appear normal. Mood & affect appropriate.     Data Reviewed:   CBC: Recent Labs  Lab 07/19/2018 1426 07/17/2018 0406 08/01/18 0139 08/02/18 0405 08/03/18 0303 08/04/18 0431  WBC  11.5* 11.6* 13.2* 9.6 5.3 7.1  NEUTROABS 9.8*  --   --   --   --   --   HGB 14.1 12.8* 10.9* 10.0* 9.4* 8.8*  HCT 45.1 40.1 34.4* 31.5* 29.4* 28.5*  MCV 95.1 93.5 93.7 94.3 92.2 96.3  PLT 185 179 140* 141* 170 174   Basic Metabolic Panel: Recent Labs  Lab 07/11/2018 0406 08/01/18 0139 08/02/18 0405 08/03/18 0303 08/04/18 0431  NA 137 134* 132* 132* 132*  K 3.6 3.5 3.9 4.4 4.9  CL 96* 95* 92* 93* 94*  CO2 31 24 29 29 26   GLUCOSE 196* 206* 214* 131* 137*  BUN 24* 23 25* 35* 41*  CREATININE 1.48* 1.56* 1.57* 1.69* 1.79*  CALCIUM 8.5* 8.2* 8.1* 8.2* 8.1*   GFR: Estimated Creatinine Clearance: 31 mL/min (A) (by C-G formula based on SCr of 1.79 mg/dL (H)). Liver Function Tests: No results for input(s): AST, ALT, ALKPHOS, BILITOT, PROT, ALBUMIN in the last 168 hours. No results for input(s): LIPASE, AMYLASE in the last 168 hours. No results for input(s): AMMONIA in the last 168 hours. Coagulation Profile: Recent Labs  Lab 07/22/2018 1426  INR 1.11   Cardiac Enzymes: No results for input(s): CKTOTAL, CKMB, CKMBINDEX, TROPONINI in the last 168 hours. BNP (last 3 results) No results for input(s): PROBNP in the last 8760 hours. HbA1C: No results for input(s): HGBA1C in the last 72 hours. CBG: Recent Labs  Lab 08/03/18 2355 08/04/18 0306 08/04/18 0753 08/04/18 0845 08/04/18 1116  GLUCAP 127* 119* 107* 117* 128*   Lipid Profile: No results for input(s): CHOL, HDL, LDLCALC, TRIG, CHOLHDL, LDLDIRECT in the last 72 hours. Thyroid Function Tests: No results for input(s): TSH, T4TOTAL, FREET4, T3FREE, THYROIDAB in the last 72 hours. Anemia Panel: No results for input(s): VITAMINB12, FOLATE, FERRITIN, TIBC, IRON, RETICCTPCT in the last 72 hours. Urine analysis:    Component Value Date/Time   COLORURINE YELLOW 05/17/2018 1819   APPEARANCEUR CLEAR 05/17/2018 1819   LABSPEC 1.009 05/17/2018 1819   PHURINE 5.0 05/17/2018 1819   GLUCOSEU >=500 (A) 05/17/2018 1819   HGBUR  NEGATIVE 05/17/2018 1819   BILIRUBINUR NEGATIVE 05/17/2018 1819   KETONESUR NEGATIVE 05/17/2018 1819   PROTEINUR NEGATIVE 05/17/2018 1819   UROBILINOGEN 0.2 12/09/2010 1650   NITRITE NEGATIVE 05/17/2018 1819   LEUKOCYTESUR NEGATIVE 05/17/2018 1819   Sepsis Labs: @LABRCNTIP (procalcitonin:4,lacticidven:4)  ) Recent Results (from the past 240 hour(s))  Surgical pcr screen     Status: None   Collection Time: 07/18/2018  6:50 PM  Result Value Ref Range Status   MRSA, PCR NEGATIVE NEGATIVE Final   Staphylococcus aureus NEGATIVE NEGATIVE Final    Comment: (NOTE) The Xpert SA Assay (FDA approved for NASAL specimens in patients 51 years of age and older), is one component of a comprehensive surveillance program. It is not intended to diagnose infection nor to guide or monitor treatment. Performed at [facility], [place] [address], [place], [place] [address]          Radiology Studies: Dg Chest 2 View  Result Date: 08/02/2018 CLINICAL DATA:  Hypoxia EXAM: CHEST - 2 VIEW COMPARISON:  08/06/2018 chest radiograph.  FINDINGS: Stable configuration of left subclavian Port-A-Cath. Stable cardiomediastinal silhouette with top-normal heart size. No pneumothorax. Stable small right pleural effusion. No significant left pleural effusion. No overt pulmonary edema. Bilateral retrocardiac opacity appears stable. IMPRESSION: 1. Stable small right pleural effusion. 2. Stable bilateral retrocardiac opacity, favor atelectasis. Electronically Signed   By: Ilona Sorrel M.D.   On: 08/02/2018 17:38        Scheduled Meds: . amiodarone  200 mg Oral Q M,W,F  . apixaban  2.5 mg Oral BID  . arformoterol  15 mcg Nebulization BID  . atorvastatin  20 mg Oral QHS  . budesonide (PULMICORT) nebulizer solution  0.5 mg Nebulization BID  . chlorhexidine  15 mL Mouth Rinse BID  . diltiazem  120 mg Oral Daily  . furosemide  40 mg Oral Daily  . guaiFENesin  1,200 mg Oral BID  . insulin aspart  0-9 Units  Subcutaneous Q4H  . insulin detemir  17 Units Subcutaneous BID  . ipratropium-albuterol  3 mL Nebulization TID  . levothyroxine  25 mcg Oral QAC breakfast  . loratadine  10 mg Oral Daily  . mouth rinse  15 mL Mouth Rinse q12n4p  . oxybutynin  5 mg Oral QHS  . polyethylene glycol  17 g Oral Daily  . predniSONE  10 mg Oral Q breakfast  . senna-docusate  1 tablet Oral BID   Continuous Infusions:    LOS: 5 days    Time spent: 53min    Domenic Polite, MD [facility]  08/04/2018, 12:10 PM

## 2018-08-04 NOTE — Significant Event (Signed)
Rapid Response Event Note  Overview: Time Called: 5697 Arrival Time: 9480 Event Type: Respiratory  Initial Focused Assessment: Patient with increased WOB  Desat on 10L HF O2 sats 82%  Per RN patient has been lethargic this afternoon. Narcan given.  Upon my arrival patient is alert and oriented on 100% NRB O2 sats 96% Lung sounds rhonchi occ exp wheeze. Per RN improves briefly with respiratory treatment then becomes labored and desats again.    Interventions: Dr Broadus John at bedside, spoke with patient and family. Code status changed to DNR/DNI ABG done Plan move to [facility] for Bipap bc increase WOB Unable to given lasix at this time.  BP 88/60  HR 94 PCXR done  Plan of Care (if not transferred):   Event Summary: Name of Physician Notified: Dr Broadus John at      at    Outcome: Transferred (Comment)(4E03)  Event End Time: Shannon  Raliegh Ip

## 2018-08-04 NOTE — Progress Notes (Signed)
Called by RN, pt became more hypoxic and remains relatively hypotensive  Requiring Non rebreather Complex situation with 89/M w/ severe COPD on 6L Home O2, H/o Lung CA and XRT/some radiation fibrosis likely, AFib, Chronic diastolic CHF post op with worsening hypoxemia from CHF, post op state/pain, very weak cough, poor pulm toilet. Only got 1 dose of Vicodin this am for pain On Exam, Mentating well, but tachypneic in mild distress, Hypoxic on NRM -BP has been in 88 range most of the day, not very symptomatic from hypotension, couldn't tolerate lasix due to hypotension today -On exam, diffuse ronchi, conducted upper airway sounds -DIscussed Code status with pt and eldest daughter again, they agree to DNR -check ABG, doesn't appear hypercarbic, stat CXR, nebs -recommended BIPAP for Work of breathing temporarily, doesn't look hypercarbic, will FU ABG, given Narcan too -Continue nebs, Pulm toilet as tol -Palliative consult for Goals of care -if deteriorates recommended Comfort focused care to Pt and daughter  Domenic Polite, MD

## 2018-08-04 NOTE — Plan of Care (Signed)
  Problem: Clinical Measurements: Goal: Ability to maintain clinical measurements within normal limits will improve Outcome: Progressing   Problem: Clinical Measurements: Goal: Respiratory complications will improve Outcome: Progressing   Problem: Activity: Goal: Risk for activity intolerance will decrease Outcome: Progressing   Problem: Pain Managment: Goal: General experience of comfort will improve Outcome: Progressing   Problem: Safety: Goal: Ability to remain free from injury will improve Outcome: Progressing   Problem: Skin Integrity: Goal: Risk for impaired skin integrity will decrease Outcome: Progressing

## 2018-08-05 DIAGNOSIS — Z7189 Other specified counseling: Secondary | ICD-10-CM

## 2018-08-05 DIAGNOSIS — Z515 Encounter for palliative care: Secondary | ICD-10-CM

## 2018-08-05 DIAGNOSIS — J9621 Acute and chronic respiratory failure with hypoxia: Secondary | ICD-10-CM

## 2018-08-05 LAB — BASIC METABOLIC PANEL
Anion gap: 12 (ref 5–15)
BUN: 56 mg/dL — ABNORMAL HIGH (ref 8–23)
CO2: 25 mmol/L (ref 22–32)
Calcium: 8.3 mg/dL — ABNORMAL LOW (ref 8.9–10.3)
Chloride: 94 mmol/L — ABNORMAL LOW (ref 98–111)
Creatinine, Ser: 2.31 mg/dL — ABNORMAL HIGH (ref 0.61–1.24)
GFR calc Af Amer: 27 mL/min — ABNORMAL LOW (ref >60)
GFR calc non Af Amer: 24 mL/min — ABNORMAL LOW (ref >60)
Glucose, Bld: 183 mg/dL — ABNORMAL HIGH (ref 70–99)
Potassium: 5 mmol/L (ref 3.5–5.1)
Sodium: 131 mmol/L — ABNORMAL LOW (ref 135–145)

## 2018-08-05 LAB — GLUCOSE, CAPILLARY
GLUCOSE-CAPILLARY: 154 mg/dL — AB (ref 70–99)
Glucose-Capillary: 171 mg/dL — ABNORMAL HIGH (ref 70–99)
Glucose-Capillary: 141 mg/dL — ABNORMAL HIGH (ref 70–99)
Glucose-Capillary: 108 mg/dL — ABNORMAL HIGH (ref 70–99)
Glucose-Capillary: 157 mg/dL — ABNORMAL HIGH (ref 70–99)
Glucose-Capillary: 109 mg/dL — ABNORMAL HIGH (ref 70–99)

## 2018-08-05 LAB — CBC
HEMATOCRIT: 28.4 % — AB (ref 39.0–52.0)
Hemoglobin: 8.8 g/dL — ABNORMAL LOW (ref 13.0–17.0)
MCH: 29.1 pg (ref 26.0–34.0)
MCHC: 31 g/dL (ref 30.0–36.0)
MCV: 94 fL (ref 80.0–100.0)
Platelets: 235 10*3/uL (ref 150–400)
RBC: 3.02 MIL/uL — ABNORMAL LOW (ref 4.22–5.81)
RDW: 14.3 % (ref 11.5–15.5)
WBC: 10.4 10*3/uL (ref 4.0–10.5)
nRBC: 0.8 % — ABNORMAL HIGH (ref 0.0–0.2)

## 2018-08-05 MED ORDER — FUROSEMIDE 10 MG/ML IJ SOLN
40.0000 mg | Freq: Once | INTRAMUSCULAR | Status: AC
  Administered 2018-08-05: 40 mg via INTRAVENOUS
  Filled 2018-08-05: qty 4

## 2018-08-05 MED ORDER — ACETAMINOPHEN 500 MG PO TABS
1000.0000 mg | ORAL_TABLET | Freq: Three times a day (TID) | ORAL | Status: DC
  Administered 2018-08-05 – 2018-08-06 (×3): 1000 mg via ORAL
  Filled 2018-08-05 (×3): qty 2

## 2018-08-05 MED ORDER — OXYCODONE HCL 5 MG PO TABS
5.0000 mg | ORAL_TABLET | ORAL | Status: DC | PRN
  Administered 2018-08-05 (×2): 5 mg via ORAL
  Filled 2018-08-05 (×3): qty 1

## 2018-08-05 NOTE — Progress Notes (Addendum)
Dr. Broadus John paged via Amion about patient's O2 requirements at this time.   15 Dr. Broadus John updated on patient status. No new orders. Will continue to monitor.   Emelda Fear, RN

## 2018-08-05 NOTE — Consult Note (Signed)
Consultation Note Date: 08/05/2018   Patient Name: [name]  DOB: 01-04-1925  MRN: [id number]  Age / Sex: 85 y.o., male  PCP: Prince Solian, MD Referring Physician: Domenic Polite, MD  Reason for Consultation: Establishing goals of care  HPI/Patient Profile: 85 y.o. male  with past medical history of COPD (6L at home), diastolic CHF, atrial fibrillation, HTN, h/o NSCLC, diabetes admitted on 07/28/2018 with fall with left hip fracture. Surgical repair of fracture is complete but hospital stay complicated by continued respiratory decline.   Clinical Assessment and Goals of Care: I met today with [name] along with daughter, Butch Penny, and grandson. [name] is in and out of conversation but he is oriented when he is awake. We discussed his chronic severe COPD (6L oxygen at home) and then his fall and decline over this hospitalization. He was living independently at home with majority of his children close by that check on him often. He was able to shower himself and even cook some by himself.   We further discussed GOC. There was some confusion regarding DNR that [name] was able to clarify his wishes for DNR. He tells me that he is in pain and he is suffering with his breathing. He tells [place] that he is not sure how much longer he can go on like this. We discussed the option to move to full comfort care to ensure he is not suffering and that this is his choice. He does want to continue some interventions but admits that he is close to wanting comfort measures. We agreed to continue current treatments for now.   I spoke further with Butch Penny. Butch Penny asks my opinion if he may be able to make if to SNF. I told her that I doubt he will make it to SNF with his level of respiratory decline and desat into low 70s just with conversation and slight movement in bed. She understands poor prognosis and support her fathers  decisions. We discussed plan to try and better manage pain and respiratory symptoms to try and provide better relief. We will reassess if continued aggressive care still makes sense.   Primary Decision Maker PATIENT    SUMMARY OF RECOMMENDATIONS   - DNR confirmed - Considering transition to full comfort measures - Discussed with daughter the limited benefits of BiPAP and escalating therapies  Code Status/Advance Care Planning:  DNR   Symptom Management:   Pain: Tylenol 1000 mg po TID. OxyIR 5 mg every 4 hours prn severe pain.   Palliative Prophylaxis:   Aspiration, Delirium Protocol, Frequent Pain Assessment and Turn Reposition  Psycho-social/Spiritual:   Desire for further Chaplaincy support:yes  Additional Recommendations: Caregiving  Support/Resources and Grief/Bereavement Support  Prognosis:   Unless significant improvement I doubt he will leave the hospital. Prognosis extremely poor. High risk for acute decompensation and hospital death.   Discharge Planning: To Be Determined      Primary Diagnoses: Present on Admission: . Benign prostatic hyperplasia . Chronic diastolic (congestive) heart failure ([place]) . Chronic respiratory  failure with hypoxia ([place]) . COPD (chronic obstructive pulmonary disease) ([place]) . Diabetic peripheral neuropathy ([place]) . Hyperlipidemia . Hypertension . Hypothyroidism . Iron deficiency anemia . Lung cancer ([place]) . Paroxysmal atrial fibrillation ([facility]) . Type II diabetes mellitus with renal manifestations ([place])   I have reviewed the medical record, interviewed the patient and family, and examined the patient. The following aspects are pertinent.  Past Medical History:  Diagnosis Date  . Acute diastolic CHF (congestive heart failure) ([place])   . Allergic conjunctivitis   . Allergic rhinitis   . Atrial fibrillation ([place])   . BPH (benign prostatic hyperplasia)   . Cataract   . Cellulitis    hx  . COPD (chronic obstructive pulmonary  disease) ([place])       . Diabetic peripheral neuropathy ([place])   . Diverticulosis    hx  . DJD (degenerative joint disease)    Left Knee  . DVT (deep venous thrombosis) ([place])    IN RIGHT ARM 11/2010   . Hearing loss   . Hyperlipidemia   . Hypertension   . Iron deficiency anemia 03/27/2016  . Iron deficiency anemia due to chronic blood loss 03/27/2016  . Lung cancer ([place])     history of stage IIIA non-small cell lung cancer who is currently being treated with both  Radiation and Chemotherapy  . Malabsorption of iron 03/27/2016  . Obstructive sleep apnea (adult)  10/29/2012   This patient has mild obstructive sleep apnea, tested in March 2014 at [facility] sleep. He had been a CPAP user for 14 years. AHi 10.7 He was titrated to a pressure of 9 cm water( after auto - titration). CPAP at night  --- 8-10 YR AGO....,OSA-diagnosed 2000  . Pneumonia    history  . Secondary diabetes with peripheral neuropathy ([place]) 11/07/2012   Social History   Socioeconomic History  . Marital status: Widowed    Spouse name: Not on file  . Number of children: 5  . Years of education: 8  . Highest education level: Not on file  Occupational History  . Occupation: farmer    Comment: retired    Fish farm manager: RETIRED  Social Needs  . Financial resource strain: Not on file  . Food insecurity:    Worry: Not on file    Inability: Not on file  . Transportation needs:    Medical: Not on file    Non-medical: Not on file  Tobacco Use  . Smoking status: Former Smoker    Packs/day: 1.00    Years: 50.00    Pack years: 50.00    Types: Cigarettes    Start date: 05/18/1943    Last attempt to quit: 06/10/1992    Years since quitting: 26.1  . Smokeless tobacco: Never Used  . Tobacco comment: quit tobacco 3 years ago  Substance and Sexual Activity  . Alcohol use: No    Alcohol/week: 0.0 standard drinks  . Drug use: No  . Sexual activity: Not on file  Lifestyle  . Physical activity:    Days per week: Not on file     Minutes per session: Not on file  . Stress: Not on file  Relationships  . Social connections:    Talks on phone: Not on file    Gets together: Not on file    Attends religious service: Not on file    Active member of club or organization: Not on file    Attends meetings of clubs or organizations: Not on file    Relationship  status: Not on file  Other Topics Concern  . Not on file  Social History Narrative   Patient is widowed and lives alone.   Patient is retired.   Patient has five adult children.   Patient has a 8th grade education.   Patient is right-handed.   Patient does not drink caffeine.   Family History  Problem Relation Age of Onset  . Coronary artery disease Unknown   . Cancer Unknown   . Multiple sclerosis Unknown   . Diabetes type II Unknown   . Anesthesia problems Neg Hx   . Hypotension Neg Hx   . Malignant hyperthermia Neg Hx   . Pseudochol deficiency Neg Hx    Scheduled Meds: . amiodarone  200 mg Oral Q M,W,F  . apixaban  2.5 mg Oral BID  . arformoterol  15 mcg Nebulization BID  . atorvastatin  20 mg Oral QHS  . budesonide (PULMICORT) nebulizer solution  0.5 mg Nebulization BID  . chlorhexidine  15 mL Mouth Rinse BID  . diltiazem  120 mg Oral Daily  . furosemide  40 mg Oral Daily  . guaiFENesin  1,200 mg Oral BID  . insulin aspart  0-9 Units Subcutaneous Q4H  . insulin detemir  17 Units Subcutaneous BID  . ipratropium-albuterol  3 mL Nebulization TID  . levothyroxine  25 mcg Oral QAC breakfast  . loratadine  10 mg Oral Daily  . mouth rinse  15 mL Mouth Rinse q12n4p  . oxybutynin  5 mg Oral QHS  . polyethylene glycol  17 g Oral Daily  . predniSONE  10 mg Oral Q breakfast  . senna-docusate  1 tablet Oral BID   Continuous Infusions: PRN Meds:.acetaminophen, albuterol, bisacodyl, guaiFENesin, HYDROcodone-acetaminophen, menthol-cetylpyridinium **OR** phenol, naLOXone (NARCAN)  injection, ondansetron **OR** ondansetron (ZOFRAN) IV, sodium chloride flush,  temazepam Allergies  Allergen Reactions  . Tape Other (See Comments)    SKIN IS FRAGILE AND WILL TEAR EASILY!!!!   Review of Systems  Unable to perform ROS: Acuity of condition  Constitutional: Positive for activity change and fatigue.  Respiratory: Positive for shortness of breath.     Physical Exam Vitals signs and nursing note reviewed.  Constitutional:      General: He is in acute distress.     Appearance: He is ill-appearing.     Interventions: He is not intubated. Cardiovascular:     Rate and Rhythm: Normal rate and regular rhythm.  Pulmonary:     Effort: Tachypnea, accessory muscle usage and respiratory distress present. He is not intubated.  Abdominal:     General: Abdomen is flat.  Neurological:     Mental Status: He is alert and oriented to person, place, and time.     Vital Signs: BP 101/74   Pulse 80   Temp 97.6 F (36.4 C) (Oral)   Resp (!) 21   Ht _0  (1.778 m)   Wt 98.4 kg   SpO2 97%   BMI 31.14 kg/m  Pain Scale: 0-10 POSS *See Group Information*: 1-Acceptable,Awake and alert Pain Score: Asleep   SpO2: SpO2: 97 % O2 Device:SpO2: 97 % O2 Flow Rate: .O2 Flow Rate (L/min): 10 L/min  IO: Intake/output summary:   Intake/Output Summary (Last 24 hours) at 08/05/2018 1547 Last data filed at 08/05/2018 0821 Gross per 24 hour  Intake -  Output 650 ml  Net -650 ml    LBM: Last BM Date: 08/03/18 Baseline Weight: Weight: 98.4 kg Most recent weight: Weight: 98.4 kg  Palliative Assessment/Data: 30%   Flowsheet Rows     Most Recent Value  Intake Tab  Referral Department  Hospitalist  Unit at Time of Referral  Med/Surg Unit  Palliative Care Primary Diagnosis  Trauma  Date Notified  08/05/18  Palliative Care Type  New Palliative care  Reason for referral  Clarify Goals of Care  Date of Admission  07/18/2018  # of days IP prior to Palliative referral  6  Clinical Assessment  Psychosocial & Spiritual Assessment  Palliative Care Outcomes       Time In: 1500 Time Out: 1620 Time Total: 80 min Greater than 50%  of this time was spent counseling and coordinating care related to the above assessment and plan.  Signed by: Vinie Sill, NP Palliative Medicine Team Pager # [phone] (M-F 8a-5p) Team Phone # [phone] (Nights/Weekends)

## 2018-08-05 NOTE — Progress Notes (Addendum)
RT placed pt on bipap through the dream station, 12/6. Pt seems to be tolerating well at this time. RT will continue to monitor as needed.

## 2018-08-05 NOTE — Progress Notes (Signed)
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 -Orthopedics consulted, underwent left hip intramedullary nail 2/21. -Postop course complicated by worsening respiratory failure, multifactorial from mild CHF but predominantly due to atelectasis, postop pain, weak cough and decreased ability to clear secretions. Pulm was following -2/25: Transferred to stepdown for BiPAP due to increased work of breathing and worsening hypoxia, slightly improved  Assessment & Plan:   Left hip fracture -Secondary to mechanical fall,  -Ortho following, underwent left hip intramedullary nailing 2/21 -Eliquis resumed postop -Continue physical therapy -Postop course complicated by respiratory failure, poor pulmonary toilet, very weak cough and hypoxemia,  -Plan for SNF for rehab when respiratory status is stable, doubt significant ability to do much therapy  Acute on chronic hypoxic respiratory failure -At baseline has advanced COPD and possibly radiation fibrosis on 6 L home O2 -Now worsened in the setting of postop pain, debility, limited pulmonary reserve weak cough decreased ability to clear secretions and mild component of CHF -Diuresed aggressively with IV Lasix 3 days ago, Pulm was following -Now blood pressure remains soft, 85-95 range, remains on oral Lasix, unable to tolerate aggressive diuresis with this BP -Continue duo nebs, flutter valve, incentive spirometer, PT, out of bed -will also add chest PT -No  wheezing, continue prednisone 10 mg daily-home dose -Required BiPAP yesterday for increased work of breathing, chest x-ray with atelectasis appears to have stabilized, also narcotics discontinued, we discussed CODE STATUS with patient and daughter yesterday afternoon he was agreeable to DNR, also palliative consult requested for goals of care, remains at high risk of deterioration  -No fever or leukocytosis, do not see any indication for antibiotics at this time  COPD/chronic respiratory failure -On 6 L home O2 at baseline -See above, now back on prednisone 10 mg daily  Acute on chronic diastolic congestive heart failure  -Echo on 10/2017 with ejection fraction 55 to 31% and mild diastolic dysfunction,  -Was diuresed with IV Lasix postop for 2 days, CXR with mild pulmonary vascular congestion, given low BPs and significant worsening creatinine cannot tolerate aggressive diuresis at this time   Paroxysmal A. fib with controlled rate -Eliquis held for surgery, restarted -Heart rate controlled, continue Cardizem CD and amiodarone   Hypothyroidism - cont with synthroid  AKI on CKD stage III -Creatinine worsened to 2.3 from baseline around 1.5 due to hypotension and diuresis -monitor  Diabetes mellitus type II  - Hold metformin ,  continue Levemir dose, CBGs stable, decrease dose  Hyperlipidemia; continue with Lipitor  DVT Prophylaxis : Eliquis resumed  DNR Family communication: Daughter at bedside Disposition: SNF if stabilizes   Consults called: PCCM, ortho    Procedures: Left hip intramedullary nail 2/21.  Antimicrobials:    Subjective: -Breathing a little better today  Objective: Vitals:   08/05/18 0740 08/05/18 0753 08/05/18 0900 08/05/18 1000  BP:  108/82    Pulse:  80 83 80  Resp:  19 (!) 26 17  Temp:      TempSrc:      SpO2: 92% 100%  Weight:      Height:        Intake/Output Summary (Last 24 hours) at 08/05/2018 1109 Last data filed at 08/05/2018  6237 Gross per 24 hour  Intake 240 ml  Output 1150 ml  Net -910 ml   Filed Weights   08/04/2018 1403  Weight: 98.4 kg    Examination: Gen: Early frail chronically ill appearing male sitting up in bed, no distress HEENT: PERRLA, Neck supple, no JVD Lungs: Poor air movement bilaterally, diffuse scattered rhonchi, few crackles on the left base  CVS: S1-S2/regular rate rhythm Abd: soft, Non tender, non distended, BS present Extremities: No edema, left hip with dressing Skin: no new rashes Psychiatry: Judgement and insight appear normal. Mood & affect appropriate.     Data Reviewed:   CBC: Recent Labs  Lab 07/18/2018 1426  08/01/18 0139 08/02/18 0405 08/03/18 0303 08/04/18 0431 08/05/18 0256  WBC 11.5*   < > 13.2* 9.6 5.3 7.1 10.4  NEUTROABS 9.8*  --   --   --   --   --   --   HGB 14.1   < > 10.9* 10.0* 9.4* 8.8* 8.8*  HCT 45.1   < > 34.4* 31.5* 29.4* 28.5* 28.4*  MCV 95.1   < > 93.7 94.3 92.2 96.3 94.0  PLT 185   < > 140* 141* 170 174 235   < > = values in this interval not displayed.   Basic Metabolic Panel: Recent Labs  Lab 08/01/18 0139 08/02/18 0405 08/03/18 0303 08/04/18 0431 08/05/18 0256  NA 134* 132* 132* 132* 131*  K 3.5 3.9 4.4 4.9 5.0  CL 95* 92* 93* 94* 94*  CO2 24 29 29 26 25   GLUCOSE 206* 214* 131* 137* 183*  BUN 23 25* 35* 41* 56*  CREATININE 1.56* 1.57* 1.69* 1.79* 2.31*  CALCIUM 8.2* 8.1* 8.2* 8.1* 8.3*   GFR: Estimated Creatinine Clearance: 24 mL/min (A) (by C-G formula based on SCr of 2.31 mg/dL (H)). Liver Function Tests: No results for input(s): AST, ALT, ALKPHOS, BILITOT, PROT, ALBUMIN in the last 168 hours. No results for input(s): LIPASE, AMYLASE in the last 168 hours. No results for input(s): AMMONIA in the last 168 hours. Coagulation Profile: Recent Labs  Lab 08/04/2018 1426  INR 1.11   Cardiac Enzymes: No results for input(s): CKTOTAL, CKMB, CKMBINDEX, TROPONINI in the last 168 hours. BNP (last 3 results) No results for  input(s): PROBNP in the last 8760 hours. HbA1C: No results for input(s): HGBA1C in the last 72 hours. CBG: Recent Labs  Lab 08/04/18 1616 08/04/18 2004 08/04/18 2347 08/05/18 0305 08/05/18 0815  GLUCAP 169* 221* 166* 171* 141*   Lipid Profile: No results for input(s): CHOL, HDL, LDLCALC, TRIG, CHOLHDL, LDLDIRECT in the last 72 hours. Thyroid Function Tests: No results for input(s): TSH, T4TOTAL, FREET4, T3FREE, THYROIDAB in the last 72 hours. Anemia Panel: No results for input(s): VITAMINB12, FOLATE, FERRITIN, TIBC, IRON, RETICCTPCT in the last 72 hours. Urine analysis:    Component Value Date/Time   COLORURINE YELLOW 05/17/2018 1819   APPEARANCEUR CLEAR 05/17/2018 1819   LABSPEC 1.009 05/17/2018 1819   PHURINE 5.0 05/17/2018 1819   GLUCOSEU >=500 (A) 05/17/2018 1819   HGBUR NEGATIVE 05/17/2018 1819   BILIRUBINUR NEGATIVE 05/17/2018 1819   KETONESUR NEGATIVE 05/17/2018 1819   PROTEINUR NEGATIVE 05/17/2018 1819   UROBILINOGEN 0.2 12/09/2010 1650   NITRITE NEGATIVE 05/17/2018 1819   LEUKOCYTESUR NEGATIVE 05/17/2018 1819   Sepsis Labs: @LABRCNTIP (procalcitonin:4,lacticidven:4)  ) Recent Results (from the  past 240 hour(s))  Surgical pcr screen     Status: None   Collection Time: 08/06/2018  6:50 PM  Result Value Ref Range Status   MRSA, PCR NEGATIVE NEGATIVE Final   Staphylococcus aureus NEGATIVE NEGATIVE Final    Comment: (NOTE) The Xpert SA Assay (FDA approved for NASAL specimens in patients 70 years of age and older), is one component of a comprehensive surveillance program. It is not intended to diagnose infection nor to guide or monitor treatment. Performed at [facility], [place] [address], [place], [place] [address]          Radiology Studies: Dg Chest Port 1 View  Result Date: 08/04/2018 CLINICAL DATA:  Initial evaluation for acute hypoxia. EXAM: PORTABLE CHEST 1 VIEW COMPARISON:  Prior radiograph from 08/02/2018 FINDINGS: Examination technically  limited as the right hemithorax is incompletely visualized. Left-sided Port-A-Cath in place, stable. Cardiomegaly unchanged. Mediastinal silhouette normal. Aortic atherosclerosis. Lungs hypoinflated. Small layering right pleural effusion, mildly worsened from previous. Persistent patchy bibasilar opacities could reflect atelectasis or infiltrates, similar to previous. Underlying perihilar vascular congestion has mildly worsened from previous. No frank pulmonary edema. No pneumothorax. No acute osseous finding. IMPRESSION: 1. Slight interval worsening in perihilar vascular congestion in small layering right pleural effusion. 2. Superimposed bibasilar opacities, right greater than left. Atelectasis is favored, although infiltrates could be considered in the correct clinical setting. Electronically Signed   By: Jeannine Boga M.D.   On: 08/04/2018 18:29        Scheduled Meds: . amiodarone  200 mg Oral Q M,W,F  . apixaban  2.5 mg Oral BID  . arformoterol  15 mcg Nebulization BID  . atorvastatin  20 mg Oral QHS  . budesonide (PULMICORT) nebulizer solution  0.5 mg Nebulization BID  . chlorhexidine  15 mL Mouth Rinse BID  . diltiazem  120 mg Oral Daily  . furosemide  40 mg Oral Daily  . guaiFENesin  1,200 mg Oral BID  . insulin aspart  0-9 Units Subcutaneous Q4H  . insulin detemir  17 Units Subcutaneous BID  . ipratropium-albuterol  3 mL Nebulization TID  . levothyroxine  25 mcg Oral QAC breakfast  . loratadine  10 mg Oral Daily  . mouth rinse  15 mL Mouth Rinse q12n4p  . oxybutynin  5 mg Oral QHS  . polyethylene glycol  17 g Oral Daily  . predniSONE  10 mg Oral Q breakfast  . senna-docusate  1 tablet Oral BID   Continuous Infusions:    LOS: 6 days    Time spent: 47min    Domenic Polite, MD [facility]  08/05/2018, 11:09 AM

## 2018-08-05 NOTE — Progress Notes (Signed)
RT done Flutter with pt. Pt able to do with good effort X 10. Pt had a NPC. RT will cont to follow

## 2018-08-05 NOTE — Progress Notes (Signed)
Pt stated he was feeling nauseous, pt stated to come back at a later time. RT came back about an hour later, RN stated pt was still feeling somewhat nauseous on and off to hold off on cpap for now and will call if needed. RT will continue to monitor as needed.

## 2018-08-05 NOTE — Progress Notes (Signed)
PT Cancellation Note  Patient Details Name: [name] MRN: [id number] DOB: 01-07-1925   Cancelled Treatment:    Reason Eval/Treat Not Completed: Other (comment)  Attempted to see pt for bed level exercises. Discussed pt status with RN, Lorenza Chick, who reported that pt's SpO2 levels are still dropping while on 12 L/min O2. Palliative in room. Will follow up.  Ronnell Guadalajara, SPT    Ronnell Guadalajara 08/05/2018, 3:31 PM

## 2018-08-05 NOTE — Progress Notes (Addendum)
Patient on to bed pan. O2 dropped to 69% staying consistantly in the 70's Resp notified. Rapid notified. MD notified via Amion system.  1710 Resp at bedside  BP 102/73 HR 86 RR19

## 2018-08-05 NOTE — Progress Notes (Signed)
RT called to the pts bedside for pt desating 70s.  RT arrived pt had previously been maintaining sats on 10L HFNC. RT found pt on 15L HFNC sats 98%, RR 24, HR 87, BBS audible CR. Rapid response and RN at the pts bedside. Per RN pt desats on exertion. Pt in no distress. Pt speaking full sentences with RN. Per RN notified MD per MD for RN to wean FIO2 to 8L HFNC for a sat goal > 88%. RT will cont to follow

## 2018-08-05 NOTE — Progress Notes (Signed)
Physical Therapy Treatment Patient Details Name: [name] MRN: [id number] DOB: [birth year] Today's Date: 08/05/2018    History of Present Illness 85 y.o. 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, cancer free, alos has HTN, DJD peripheral neuropathy     PT Comments    Pt received in bed. Prior to entering room, discussed pt's current status with RN, pt's daughter, and palliative NP. Per daughter, pt still wants to participate in therapy as long as he is able. He is very painful because he is currently unable to receive pain medication due to his respiratory status. Pt agreed to participate in PT today. Attempted PROM ankle pumps on the L LE, which caused pt to hold his breath and grimace with pain. He was able to tolerate quad sets on R LE, but unable to tolerate them on his L LE. SpO2 remained within 88-94% on 12L/min O2 during exercises; v/c for pursed lip breathing. Due to highly increased pain, ended therapy session after attempting quad sets on L LE. Repositioned pt into chair position passively using bed, and educated his daughter on adjusting the bed to improve pt comfort. SpO2 90% upon leaving room. Added a positioning and education goal to care plan.  [name] will continue to benefit from skilled acute PT services to educate him and his family on positioning and therapeutic exercise options in order to help keep him comfortable, as long as he is still willing to participate in therapy, and as long as PT has a role in his goals of care.    Follow Up Recommendations  SNF     Equipment Recommendations  Other (comment)(defer to next venue)    Recommendations for Other Services       Precautions / Restrictions Precautions Precautions: Fall Restrictions Weight Bearing Restrictions: Yes LLE Weight Bearing: Weight bearing as  tolerated    Mobility  Bed Mobility               General bed mobility comments: unable to attempt today due to patient's SpO2 status  Transfers                    Ambulation/Gait                 Stairs             Wheelchair Mobility    Modified Rankin (Stroke Patients Only)       Balance                                            Cognition Arousal/Alertness: Awake/alert Behavior During Therapy: WFL for tasks assessed/performed Overall Cognitive Status: Within Functional Limits for tasks assessed                                        Exercises Total Joint Exercises Ankle Circles/Pumps: PROM;Left;10 reps;Supine Quad Sets: AROM;Right;5 reps(attempted 2 on L side but too painful)    General Comments        Pertinent Vitals/Pain Pain Assessment: Faces Faces Pain Scale: Hurts worst Pain Location: left hip  Pain Descriptors / Indicators: Aching;Guarding;Grimacing Pain Intervention(s): Limited activity  within patient's tolerance;Monitored during session    Home Living                      Prior Function            PT Goals (current goals can now be found in the care plan section) Acute Rehab PT Goals Patient Stated Goal: to have less pain  PT Goal Formulation: With patient Time For Goal Achievement: 08/01/18 Potential to Achieve Goals: Poor Additional Goals Additional Goal #1: PT will provide pt and family education regarding options for positioning and therapeutic exercise as long as this is congruent with his goals of care. Progress towards PT goals: Not progressing toward goals - comment(have added a positioning and pt/family education goal)    Frequency    Min 2X/week      PT Plan Current plan remains appropriate    Co-evaluation              AM-PAC PT "6 Clicks" Mobility   Outcome Measure  Help needed turning from your back to your side while in a flat bed  without using bedrails?: A Lot Help needed moving from lying on your back to sitting on the side of a flat bed without using bedrails?: A Lot Help needed moving to and from a bed to a chair (including a wheelchair)?: Total Help needed standing up from a chair using your arms (e.g., wheelchair or bedside chair)?: Total Help needed to walk in hospital room?: Total Help needed climbing 3-5 steps with a railing? : Total 6 Click Score: 8    End of Session Equipment Utilized During Treatment: Oxygen Activity Tolerance: Patient limited by pain Patient left: in bed;with call bell/phone within reach;with family/visitor present Nurse Communication: Other (comment)(status of pt's vitals with bed in chair position, with exercises) PT Visit Diagnosis: Unsteadiness on feet (R26.81);Other abnormalities of gait and mobility (R26.89);History of falling (Z91.81)     Time:  -     Charges:                        Ronnell Guadalajara, SPT    Ronnell Guadalajara 08/05/2018, 4:22 PM

## 2018-08-05 NOTE — Progress Notes (Signed)
Patient's O2 level in high 70's low 80's after left hip dressing and bed pad change.  Resp called. O2 increased to 12 liters. O2 now 94%

## 2018-08-06 DIAGNOSIS — Z515 Encounter for palliative care: Secondary | ICD-10-CM

## 2018-08-06 LAB — GLUCOSE, CAPILLARY
Glucose-Capillary: 92 mg/dL (ref 70–99)
Glucose-Capillary: 91 mg/dL (ref 70–99)
Glucose-Capillary: 111 mg/dL — ABNORMAL HIGH (ref 70–99)

## 2018-08-06 LAB — BASIC METABOLIC PANEL WITH GFR
Anion gap: 11 (ref 5–15)
BUN: 48 mg/dL — ABNORMAL HIGH (ref 8–23)
CO2: 28 mmol/L (ref 22–32)
Calcium: 8 mg/dL — ABNORMAL LOW (ref 8.9–10.3)
Chloride: 97 mmol/L — ABNORMAL LOW (ref 98–111)
Creatinine, Ser: 1.89 mg/dL — ABNORMAL HIGH (ref 0.61–1.24)
GFR calc Af Amer: 35 mL/min — ABNORMAL LOW
GFR calc non Af Amer: 30 mL/min — ABNORMAL LOW
Glucose, Bld: 87 mg/dL (ref 70–99)
Potassium: 3.7 mmol/L (ref 3.5–5.1)
Sodium: 136 mmol/L (ref 135–145)

## 2018-08-06 LAB — CBC
HCT: 26.7 % — ABNORMAL LOW (ref 39.0–52.0)
Hemoglobin: 8.4 g/dL — ABNORMAL LOW (ref 13.0–17.0)
MCH: 30.2 pg (ref 26.0–34.0)
MCHC: 31.5 g/dL (ref 30.0–36.0)
MCV: 96 fL (ref 80.0–100.0)
NRBC: 0.8 % — AB (ref 0.0–0.2)
Platelets: 218 10*3/uL (ref 150–400)
RBC: 2.78 MIL/uL — ABNORMAL LOW (ref 4.22–5.81)
RDW: 14.6 % (ref 11.5–15.5)
WBC: 8.7 10*3/uL (ref 4.0–10.5)

## 2018-08-06 MED ORDER — BIOTENE DRY MOUTH MT LIQD: 15.0000 mL | OROMUCOSAL | Status: DC | PRN

## 2018-08-06 MED ORDER — SODIUM CHLORIDE 0.9 % IV SOLN: 250.0000 mL | INTRAVENOUS | Status: DC | PRN

## 2018-08-06 MED ORDER — INSULIN ASPART 100 UNIT/ML ~~LOC~~ SOLN: 0.0000 [IU] | Freq: Three times a day (TID) | SUBCUTANEOUS | Status: DC

## 2018-08-06 MED ORDER — OXYCODONE HCL 5 MG PO TABS: 5.0000 mg | ORAL_TABLET | ORAL | Status: DC | PRN

## 2018-08-06 MED ORDER — MORPHINE SULFATE (PF) 2 MG/ML IV SOLN
2.0000 mg | INTRAVENOUS | Status: DC | PRN
  Administered 2018-08-06: 4 mg via INTRAVENOUS
  Administered 2018-08-06 (×2): 2 mg via INTRAVENOUS
  Filled 2018-08-06 (×2): qty 2
  Filled 2018-08-06: qty 1

## 2018-08-06 MED ORDER — LORAZEPAM 2 MG/ML PO CONC: 1.0000 mg | ORAL | Status: DC | PRN

## 2018-08-06 MED ORDER — GLYCOPYRROLATE 0.2 MG/ML IJ SOLN: 0.2000 mg | INTRAMUSCULAR | Status: DC | PRN

## 2018-08-06 MED ORDER — HALOPERIDOL 0.5 MG PO TABS
0.5000 mg | ORAL_TABLET | ORAL | Status: DC | PRN
  Filled 2018-08-06: qty 1

## 2018-08-06 MED ORDER — SODIUM CHLORIDE 0.9 % IV BOLUS
500.0000 mL | Freq: Once | INTRAVENOUS | Status: AC
  Administered 2018-08-06: 500 mL via INTRAVENOUS

## 2018-08-06 MED ORDER — SODIUM CHLORIDE 0.9% FLUSH
3.0000 mL | Freq: Two times a day (BID) | INTRAVENOUS | Status: DC
  Administered 2018-08-06: 3 mL via INTRAVENOUS

## 2018-08-06 MED ORDER — IPRATROPIUM-ALBUTEROL 0.5-2.5 (3) MG/3ML IN SOLN: 3.0000 mL | RESPIRATORY_TRACT | Status: DC | PRN

## 2018-08-06 MED ORDER — HALOPERIDOL LACTATE 5 MG/ML IJ SOLN: 1.0000 mg | INTRAMUSCULAR | Status: DC | PRN

## 2018-08-06 MED ORDER — MORPHINE SULFATE (PF) 2 MG/ML IV SOLN
2.0000 mg | INTRAVENOUS | Status: DC | PRN
  Administered 2018-08-06: 2 mg via INTRAVENOUS
  Filled 2018-08-06: qty 1

## 2018-08-06 MED ORDER — GLYCOPYRROLATE 1 MG PO TABS
1.0000 mg | ORAL_TABLET | ORAL | Status: DC | PRN
  Filled 2018-08-06: qty 1

## 2018-08-06 MED ORDER — HALOPERIDOL LACTATE 2 MG/ML PO CONC
0.5000 mg | ORAL | Status: DC | PRN
  Filled 2018-08-06: qty 0.3

## 2018-08-06 MED ORDER — SODIUM CHLORIDE 0.9 % IV BOLUS: 250.0000 mL | Freq: Once | INTRAVENOUS | Status: DC

## 2018-08-06 MED ORDER — ACETAMINOPHEN 325 MG PO TABS: 650.0000 mg | ORAL_TABLET | Freq: Four times a day (QID) | ORAL | Status: DC | PRN

## 2018-08-06 MED ORDER — SODIUM CHLORIDE 0.9% FLUSH: 3.0000 mL | INTRAVENOUS | Status: DC | PRN

## 2018-08-06 MED ORDER — POLYVINYL ALCOHOL 1.4 % OP SOLN
1.0000 [drp] | Freq: Four times a day (QID) | OPHTHALMIC | Status: DC | PRN
  Filled 2018-08-06: qty 15

## 2018-08-06 MED ORDER — SODIUM CHLORIDE 0.9 % IV BOLUS: 500.0000 mL | Freq: Once | INTRAVENOUS | Status: DC

## 2018-08-06 MED ORDER — ACETAMINOPHEN 650 MG RE SUPP: 650.0000 mg | Freq: Four times a day (QID) | RECTAL | Status: DC | PRN

## 2018-08-06 MED ORDER — LORAZEPAM 2 MG/ML IJ SOLN
0.5000 mg | Freq: Two times a day (BID) | INTRAMUSCULAR | Status: DC
  Administered 2018-08-06: 0.5 mg via INTRAVENOUS
  Filled 2018-08-06: qty 1

## 2018-08-06 MED ORDER — LORAZEPAM 2 MG/ML IJ SOLN: 1.0000 mg | INTRAMUSCULAR | Status: DC | PRN

## 2018-08-06 MED ORDER — LORAZEPAM 1 MG PO TABS
1.0000 mg | ORAL_TABLET | ORAL | Status: DC | PRN
  Filled 2018-08-06: qty 1

## 2018-08-06 NOTE — Progress Notes (Addendum)
Daily Progress Note   Patient Name: [name]       Date: 08/06/2018 DOB: 1924/12/15  Age: 85 y.o. MRN#: [id number] Attending Physician: Dessa Phi, DO Primary Care Physician: Prince Solian, MD Admit Date: 07/13/2018  Reason for Consultation/Follow-up: Establishing goals of care, Non pain symptom management, Pain control and Psychosocial/spiritual support   Addendum:  I would have a very low threshold to start a continuous morphine infusion at 2 mg/hr with PRN boluses if he continues to become uncomfortable on current meds.  Subjective: As I enter the room I note the patient is in respiratory distress.  His daughter comes in shortly after I do and talks with him about dying and being healed in heaven.  His son is present as well.  I talked with the patient and family about where things stand.  We discussed his family, his work (tobacco farmer), and his faith Insurance claims handler). The patient feels he is not going to improve and he wants to be comfortable.  I explained that some of the medications we give for comfort may make him sleepy and that he would likely pass away.  He understood and accepted that.  We talked about potentially transferring to hospice house for further care if he becomes stable enough to leave the hospital.  Patient and family understood and appreciated the conversation.  Butch Penny his daughter met me outside the room and explained that he would prefer not to go to hospice house.  I explained that the first step is to get him comfortable and stable.  At this point I am not certain at all that he will be able to leave the hospital.  We discussed that he may only have hours to days.   Assessment: Very pleasant elderly man in respiratory distress.   Patient Profile/HPI:   85 y.o. male  with past medical history of COPD (6L at home), diastolic CHF, atrial fibrillation, HTN, h/o NSCLC, diabetes admitted on 07/29/2018 with fall with left hip fracture. Surgical repair of fracture is complete but hospital stay complicated by continued respiratory decline.   Length of Stay: 7  Current Medications: Scheduled Meds:  . amiodarone  200 mg Oral Q M,W,F  . budesonide (PULMICORT) nebulizer solution  0.5 mg Nebulization BID  . chlorhexidine  15 mL Mouth Rinse BID  . diltiazem  120 mg Oral Daily  . furosemide  40 mg Oral Daily  . guaiFENesin  1,200 mg Oral BID  . insulin detemir  17 Units Subcutaneous BID  . mouth rinse  15 mL Mouth Rinse q12n4p  . polyethylene glycol  17 g Oral Daily  . senna-docusate  1 tablet Oral BID  . sodium chloride flush  3 mL Intravenous Q12H    Continuous Infusions: . sodium chloride      PRN Meds: sodium chloride, acetaminophen **OR** acetaminophen, antiseptic oral rinse, bisacodyl, glycopyrrolate **OR** glycopyrrolate **OR** glycopyrrolate, guaiFENesin, haloperidol **OR** haloperidol **OR** haloperidol lactate, ipratropium-albuterol, LORazepam **OR** LORazepam **OR** LORazepam, menthol-cetylpyridinium **OR** phenol, morphine injection, ondansetron **OR** ondansetron (ZOFRAN) IV, oxyCODONE, polyvinyl alcohol, sodium chloride flush, sodium chloride flush, temazepam  Physical Exam        Well developed elderly gentleman, calm, but with rapid breathing. CV tachy Resp SOB when speaking Abdomen soft nt, nd  Vital Signs: BP 95/68 (BP Location: Right Arm)   Pulse (!) 105   Temp 97.7 F (36.5 C) (Oral)   Resp 14   Ht '5\' 10"'  (1.778 m)   Wt 98.4 kg   SpO2 93%   BMI 31.14 kg/m  SpO2: SpO2: 93 % O2 Device: O2 Device: High Flow Nasal Cannula O2 Flow Rate: O2 Flow Rate (L/min): 5 L/min  Intake/output summary:   Intake/Output Summary (Last 24 hours) at 08/06/2018 1500 Last data filed at 08/06/2018 1136 Gross per 24 hour  Intake 971.67 ml    Output 1725 ml  Net -753.33 ml   LBM: Last BM Date: 08/03/18 Baseline Weight: Weight: 98.4 kg Most recent weight: Weight: 98.4 kg       Palliative Assessment/Data: 20%    Flowsheet Rows     Most Recent Value  Intake Tab  Referral Department  Hospitalist  Unit at Time of Referral  Med/Surg Unit  Palliative Care Primary Diagnosis  Trauma  Date Notified  08/05/18  Palliative Care Type  New Palliative care  Reason for referral  Clarify Goals of Care  Date of Admission  07/29/2018  # of days IP prior to Palliative referral  6  Clinical Assessment  Psychosocial & Spiritual Assessment  Palliative Care Outcomes      Patient Active Problem List   Diagnosis Date Noted  . Goals of care, counseling/discussion   . Palliative care encounter   . Acute pulmonary edema (HCC)   . Dysphagia   . Atelectasis   . Closed left hip fracture ([place]) 08/03/2018  . Respiratory failure ([place]) 05/17/2018  . Thrush 01/13/2018  . Chronic diastolic (congestive) heart failure ([place]) 10/06/2017  . GERD (gastroesophageal reflux disease) 10/06/2017  . Hypothyroidism 10/06/2017  . COPD with acute exacerbation ([place]) 10/06/2017  . Chronic respiratory failure with hypoxia ([place]) 09/14/2017  . Pleural effusion 07/22/2017  . Malignant neoplasm of hilus of right lung ([place]) 09/25/2016  . Iron deficiency anemia 03/27/2016  . Malabsorption of iron 03/27/2016  . Iron deficiency anemia due to chronic blood loss 03/27/2016  . Loss of weight 02/19/2016  . Type II diabetes mellitus with renal manifestations ([place]) 05/08/2015  . CAP (community acquired pneumonia) 05/07/2015  . Lactic acidosis 05/07/2015  . OSA on CPAP 01/03/2015  . Hypoxemia 12/31/2013  . Long term current use of anticoagulant therapy 11/12/2012  . History of DVT of right axillary vein 11/07/2012  . Secondary diabetes with peripheral neuropathy ([place]) 11/07/2012  . Hypertension 11/07/2012  . Hyperlipidemia 11/07/2012  . Benign prostatic hyperplasia  11/07/2012  . Paroxysmal atrial  fibrillation ([place]) 11/07/2012  . Diverticulosis   . Lung cancer ([place])   . Diabetic peripheral neuropathy ([place])   . COPD (chronic obstructive pulmonary disease) ([place])   . Allergic rhinitis     Palliative Care Plan    Recommendations/Plan:  Will discontinue medications and interventions not associated with comfort. Will initiate comfort medications  SOB morphine 2 mg PRN q 15 min for comfort at end of life  Left hip pain - morphine as well  Anxiety  - ativan PRN. I will circle back around before the end of the day to determine if he needs a morphine gtt.  Goals of Care and Additional Recommendations:  Limitations on Scope of Treatment: Full Comfort Care  Code Status:  DNR  Prognosis:   Hours - Days   Discharge Planning:  Anticipated Hospital Death  Care plan was discussed with attending MD, bedside RN, family  Thank you for allowing the Palliative Medicine Team to assist in the care of this patient.  Total time spent:  35 min     Greater than 50%  of this time was spent counseling and coordinating care related to the above assessment and plan.  Florentina Jenny, PA-C Palliative Medicine  Please contact Palliative MedicineTeam phone at [phone] for questions and concerns between 7 am - 7 pm.   Please see AMION for individual provider pager numbers.

## 2018-08-06 NOTE — Progress Notes (Signed)
PROGRESS NOTE    [name]  GUY:[id number] DOB: Aug 22, 1924 DOA: 07/27/2018 PCP: Patrick Solian, MD     Brief Narrative:  [name] 85 year old male with history of advanced COPD/chronic respiratory failure on 6 L home O2, history of adenocarcinoma of lung stage IIIb status post chemo and radiation 7 years ago now in remission, type 2 diabetes mellitus, paroxysmal atrial fibrillation on Eliquis and amiodarone, hypertension, chronic diastolic CHF presented to the emergency room 2/20 following a mechanical fall on admission was noted to have left femoral neck fracture. He underwent left hip intramedullary nail 2/21. Postop course complicated by worsening respiratory failure, multifactorial from mild CHF but predominantly due to atelectasis, postop pain, weak cough and decreased ability to clear secretions. Pulm was consulted. He was transferred to stepdown on 2/25 for BiPAP due to increased work of breathing and worsening hypoxia, slightly improved. Palliative care was consulted.   New events last 24 hours / Subjective: His respiratory status continues to be tenuous in nature.  He was de-satting into the 80s while eating breakfast.  He also required NIPPV overnight.  Currently, he is on high flow nasal cannula O2 while eating breakfast, with family at bedside.  He denies any worsening symptoms, denies any chest pain.  Assessment & Plan:   Active Problems:   Lung cancer ([place])   Diabetic peripheral neuropathy (HCC)   COPD (chronic obstructive pulmonary disease) (HCC)   Hypertension   Hyperlipidemia   Benign prostatic hyperplasia   Paroxysmal atrial fibrillation (HCC)   Type II diabetes mellitus with renal manifestations (HCC)   Iron deficiency anemia   Chronic respiratory failure with hypoxia (HCC)   Chronic diastolic (congestive) heart failure (HCC)   Hypothyroidism   COPD with acute exacerbation (HCC)   Closed left hip fracture (HCC)   Acute pulmonary edema (HCC)   Dysphagia  Atelectasis   Goals of care, counseling/discussion   Palliative care encounter  Left hip fracture secondary to mechanical fall -Patient underwent left hip interim medullary nailing on 2/21 -Continue PT as able  Acute on chronic hypoxemic respiratory failure -Patient has advanced COPD, possibly radiation fibrosis, is on 6 L nasal cannula O2 at home -Respiratory status has worsened in setting of postop pain, debility, limited pulmonary reserve, weak cough, decreased ability to clear secretions, mild component of CHF -Appreciate pulmonology, they have now signed off -Continue nebs, flutter valve, incentive spirometer, chest PT, home prednisone 10 mg daily - Patient has poor prognosis overall, our overall hope will be that he could be discharged to SNF on 6 L nasal cannula O2 which would be his baseline.  However, he continues to desat with minimal exertion such as even eating breakfast.  Discussed with family and patient that it is unclear that he would be able to be discharged from the hospital due to him requiring high level of oxygen supplementation  Acute on chronic diastolic congestive heart failure -Lasix IV  Paroxysmal atrial fibrillation -Continue Cardizem, amiodarone, Eliquis  Hypothyroidism -Continue Synthroid  AKI on CKD stage III -Baseline creatinine 1.5 -Stable, creatinine 1.89 today  Type 2 diabetes -Hold metformin, continue Levemir, sliding scale insulin  Hyperlipidemia -Continue Lipitor   DVT prophylaxis: Eliquis Code Status: DNR Family Communication: Daughters and son at bedside Disposition Plan: Pending further improvement in respiratory status, patient does have very poor prognosis    Consultants:   Orthopedic surgery  PCCM  Palliative care   Antimicrobials:  Anti-infectives (From admission, onward)   Start     Dose/Rate Route  Frequency Ordered Stop   07/31/2018 1400  ceFAZolin (ANCEF) IVPB 2g/100 mL premix     2 g 200 mL/hr over 30 Minutes  Intravenous Every 6 hours 07/29/2018 1118 07/25/2018 2034   07/19/2018 0724  ceFAZolin (ANCEF) 2-4 GM/100ML-% IVPB    Note to Pharmacy:  [name]   : cabinet override      08/03/2018 0724 08/06/2018 1929        Objective: Vitals:   08/06/18 0546 08/06/18 0713 08/06/18 0946 08/06/18 1134  BP:   (!) 71/56 95/68  Pulse: 96 (!) 105  (!) 105  Resp: (!) 28 (!) 21  18  Temp:    97.7 F (36.5 C)  TempSrc:    Oral  SpO2: 96% 100%  92%  Weight:      Height:        Intake/Output Summary (Last 24 hours) at 08/06/2018 1217 Last data filed at 08/06/2018 1136 Gross per 24 hour  Intake 971.67 ml  Output 1725 ml  Net -753.33 ml   Filed Weights   08/04/2018 1403  Weight: 98.4 kg    Examination:  General exam: Appears calm and comfortable  Respiratory system: Coarse breath sounds, saturating 80% on high flow nasal cannula while eating breakfast, improved to the 90s Cardiovascular system: S1 & S2 heard, tachycardic rate 100s Gastrointestinal system: Abdomen is nondistended, soft and nontender. No organomegaly or masses felt. Normal bowel sounds heard. Central nervous system: Alert and oriented. No focal neurological deficits. Extremities: Symmetric 5 x 5 power. Skin: No rashes, lesions or ulcers Psychiatry: Judgement and insight appear normal. Mood & affect appropriate.   Data Reviewed: I have personally reviewed following labs and imaging studies  CBC: Recent Labs  Lab 07/17/2018 1426  08/02/18 0405 08/03/18 0303 08/04/18 0431 08/05/18 0256 08/06/18 0517  WBC 11.5*   < > 9.6 5.3 7.1 10.4 8.7  NEUTROABS 9.8*  --   --   --   --   --   --   HGB 14.1   < > 10.0* 9.4* 8.8* 8.8* 8.4*  HCT 45.1   < > 31.5* 29.4* 28.5* 28.4* 26.7*  MCV 95.1   < > 94.3 92.2 96.3 94.0 96.0  PLT 185   < > 141* 170 174 235 218   < > = values in this interval not displayed.   Basic Metabolic Panel: Recent Labs  Lab 08/02/18 0405 08/03/18 0303 08/04/18 0431 08/05/18 0256 08/06/18 0517  NA 132* 132* 132*  131* 136  K 3.9 4.4 4.9 5.0 3.7  CL 92* 93* 94* 94* 97*  CO2 29 29 26 25 28   GLUCOSE 214* 131* 137* 183* 87  BUN 25* 35* 41* 56* 48*  CREATININE 1.57* 1.69* 1.79* 2.31* 1.89*  CALCIUM 8.1* 8.2* 8.1* 8.3* 8.0*   GFR: Estimated Creatinine Clearance: 29.3 mL/min (A) (by C-G formula based on SCr of 1.89 mg/dL (H)). Liver Function Tests: No results for input(s): AST, ALT, ALKPHOS, BILITOT, PROT, ALBUMIN in the last 168 hours. No results for input(s): LIPASE, AMYLASE in the last 168 hours. No results for input(s): AMMONIA in the last 168 hours. Coagulation Profile: Recent Labs  Lab 07/12/2018 1426  INR 1.11   Cardiac Enzymes: No results for input(s): CKTOTAL, CKMB, CKMBINDEX, TROPONINI in the last 168 hours. BNP (last 3 results) No results for input(s): PROBNP in the last 8760 hours. HbA1C: No results for input(s): HGBA1C in the last 72 hours. CBG: Recent Labs  Lab 08/05/18 1953 08/05/18 2335 08/06/18 0502 08/06/18 0539  08/06/18 1132  GLUCAP 109* 154* 92 91 111*   Lipid Profile: No results for input(s): CHOL, HDL, LDLCALC, TRIG, CHOLHDL, LDLDIRECT in the last 72 hours. Thyroid Function Tests: No results for input(s): TSH, T4TOTAL, FREET4, T3FREE, THYROIDAB in the last 72 hours. Anemia Panel: No results for input(s): VITAMINB12, FOLATE, FERRITIN, TIBC, IRON, RETICCTPCT in the last 72 hours. Sepsis Labs: No results for input(s): PROCALCITON, LATICACIDVEN in the last 168 hours.  Recent Results (from the past 240 hour(s))  Surgical pcr screen     Status: None   Collection Time: 07/25/2018  6:50 PM  Result Value Ref Range Status   MRSA, PCR NEGATIVE NEGATIVE Final   Staphylococcus aureus NEGATIVE NEGATIVE Final    Comment: (NOTE) The Xpert SA Assay (FDA approved for NASAL specimens in patients 11 years of age and older), is one component of a comprehensive surveillance program. It is not intended to diagnose infection nor to guide or monitor treatment. Performed at [facility], [place] [address], [place], [place] [address]        Radiology Studies: Dg Chest Port 1 View  Result Date: 08/04/2018 CLINICAL DATA:  Initial evaluation for acute hypoxia. EXAM: PORTABLE CHEST 1 VIEW COMPARISON:  Prior radiograph from 08/02/2018 FINDINGS: Examination technically limited as the right hemithorax is incompletely visualized. Left-sided Port-A-Cath in place, stable. Cardiomegaly unchanged. Mediastinal silhouette normal. Aortic atherosclerosis. Lungs hypoinflated. Small layering right pleural effusion, mildly worsened from previous. Persistent patchy bibasilar opacities could reflect atelectasis or infiltrates, similar to previous. Underlying perihilar vascular congestion has mildly worsened from previous. No frank pulmonary edema. No pneumothorax. No acute osseous finding. IMPRESSION: 1. Slight interval worsening in perihilar vascular congestion in small layering right pleural effusion. 2. Superimposed bibasilar opacities, right greater than left. Atelectasis is favored, although infiltrates could be considered in the correct clinical setting. Electronically Signed   By: Jeannine Boga M.D.   On: 08/04/2018 18:29      Scheduled Meds: . acetaminophen  1,000 mg Oral TID  . amiodarone  200 mg Oral Q M,W,F  . apixaban  2.5 mg Oral BID  . arformoterol  15 mcg Nebulization BID  . atorvastatin  20 mg Oral QHS  . budesonide (PULMICORT) nebulizer solution  0.5 mg Nebulization BID  . chlorhexidine  15 mL Mouth Rinse BID  . diltiazem  120 mg Oral Daily  . furosemide  40 mg Oral Daily  . guaiFENesin  1,200 mg Oral BID  . insulin aspart  0-9 Units Subcutaneous Q4H  . insulin detemir  17 Units Subcutaneous BID  . ipratropium-albuterol  3 mL Nebulization TID  . levothyroxine  25 mcg Oral QAC breakfast  . loratadine  10 mg Oral Daily  . mouth rinse  15 mL Mouth Rinse q12n4p  . oxybutynin  5 mg Oral QHS  . polyethylene glycol  17 g Oral Daily  . predniSONE  10 mg Oral Q  breakfast  . senna-docusate  1 tablet Oral BID   Continuous Infusions:   LOS: 7 days    Time spent: 40 minutes   Dessa Phi, DO [facility] [email or web address] 08/06/2018, 12:17 PM

## 2018-08-06 NOTE — Progress Notes (Signed)
Paged on call Hospitalist, Dr.Schorr, Pt's  BP 84/61 mmHg, HR 102, sinustachycardia on monitor, SPO2 79-88% with CPAP. Awaiting for response.  Notified RT about desaturation, RT stated goal for his SPO2 is above 88%, recommended to change from CPAP to non rebreathing bag with high flow O2. Will monitor.   Kennyth Lose, BSN,RN,PCCN-CMC

## 2018-08-06 NOTE — Progress Notes (Addendum)
Order  received for 500 ml for 0.9% NSS bolus. Pt appears restless and agitated, but alert and oriiented x 4 , Temazepam given as PRN order. Repeated BP 99/60 mmHg.SPO2 is 99% with 10 LPM od nonrebreathing face mask, Will continue to monitor.  Alyssha Housh,BSN,RN,PCCN-CMC

## 2018-08-06 NOTE — Progress Notes (Signed)
Telephone consult spoke with Sonia Baller, RN: Pt's port is accessed, OK to use it for labs and infusions. Please call IV Team with any questions or issues.

## 2018-08-07 ENCOUNTER — Encounter: Payer: Self-pay | Admitting: Hematology & Oncology

## 2018-08-07 DIAGNOSIS — N183 Chronic kidney disease, stage 3 unspecified: Secondary | ICD-10-CM

## 2018-08-07 DIAGNOSIS — N179 Acute kidney failure, unspecified: Secondary | ICD-10-CM

## 2018-08-09 NOTE — Progress Notes (Signed)
Pt passed way, announced his death at 02:32 am. Verified Pt death with a second person with Antonique, RN. Family members,two of Pt's daughters and sons witness at bedside.Pt's belongings given back to his daughter, Ms. Vertell Novak. [place] [facility], verified that Pt is not a donor candidate.  A charge nurse, Truman Hayward contacted with medical examiner and agreed that Pt is qualified for medical examination. Notified Dr. Myna Hidalgo, on call physician to come and sign death certificate. Pt's body was sent with death certicicate to morgue, accepted the Pt' body by a staff from security named, Princess.   Kennyth Lose, BSN,RN,PCCN-CMC

## 2018-08-09 NOTE — Death Summary Note (Signed)
DEATH SUMMARY   Patient Details  Name: [name] MRN: [id number] DOB: 30-Apr-1925  Admission/Discharge Information   Admit Date:  August 11, 2018  Date of Death: Date of Death: 08-19-18  Time of Death: Time of Death: 0232  Length of Stay: 2022-08-28  Referring Physician: Prince Solian, MD   Reason(s) for Hospitalization  Left hip fracture secondary to mechanical fall  Diagnoses  Preliminary cause of death:  Acute on chronic hypoxemic respiratory failure Secondary Diagnoses (including complications and co-morbidities):  Active Problems:   Lung cancer (HCC)   Diabetic peripheral neuropathy (HCC)   COPD (chronic obstructive pulmonary disease) (HCC)   Hypertension   Hyperlipidemia   Benign prostatic hyperplasia   Paroxysmal atrial fibrillation (HCC)   OSA on CPAP   Type II diabetes mellitus with renal manifestations (HCC)   Iron deficiency anemia   Chronic respiratory failure with hypoxia (HCC)   Acute on chronic diastolic CHF (congestive heart failure) (HCC)   Hypothyroidism   COPD with acute exacerbation (HCC)   Closed left hip fracture (HCC)   Acute pulmonary edema (HCC)   Dysphagia   Atelectasis   Goals of care, counseling/discussion   Palliative care encounter   Comfort measures only status   AKI (acute kidney injury) ([place])   CKD (chronic kidney disease) stage 3, GFR 30-59 ml/min [facility])   Brief Hospital Course (including significant findings, care, treatment, and services provided and events leading to death)  [name] 85 year old male with history of advancedCOPD/chronic respiratory failure on 6 L home O2, history of adenocarcinoma of lung stage IIIb status post chemo and radiation 7 years ago now in remission, type 2 diabetes mellitus, paroxysmal atrial fibrillation on Eliquis and amiodarone, hypertension, chronic diastolic CHF presented to the emergency room 2022-08-11 following a mechanical fall on admission was noted to have left femoral neck fracture. He underwent left  hip intramedullary nail 2/21. Postopcourse complicated by worsening respiratory failure,multifactorial from mild CHF but predominantly due to atelectasis, postop pain, weak cough and decreased ability to clear secretions. Pulm was consulted. He was transferred to stepdown on 2/25 for BiPAP due to increased work of breathing and worsening hypoxia,slightly improved. Palliative care was consulted. His respiratory status continued to be tenuous in nature.  He was de-satting into the 80s while eating breakfast.  He also required NIPPV overnight.  Ultimately, patient and family decided to pursue full comfort measures.   Pertinent Labs and Studies  Significant Diagnostic Studies Dg Chest 1 View  Result Date: 08-11-2018 CLINICAL DATA:  85 year old who fell and sustained an intertrochanteric LEFT femoral neck fracture. Preoperative evaluation. EXAM: CHEST  1 VIEW COMPARISON:  05/17/2018 and earlier. FINDINGS: AP SEMI-ERECT image was obtained. Cardiac silhouette upper normal in size to slightly enlarged, unchanged. Thoracic aorta atherosclerotic. Hilar and mediastinal contours otherwise unremarkable. LEFT subclavian Port-A-Cath with its tip projected over the LOWER SVC, unchanged; possible compression of the catheter as it courses beneath the LEFT clavicle which was not visible on prior examinations. Prominent bronchovascular markings diffusely and moderate central peribronchial thickening, unchanged. Chronic pleuroparenchymal scarring at the RIGHT lung base. No new pulmonary parenchymal abnormalities. IMPRESSION: 1. COPD. Stable borderline heart size. No acute cardiopulmonary disease. 2. Possible compression of the LEFT subclavian Port-A-Cath catheter as it courses beneath the clavicle. Electronically Signed   By: Evangeline Dakin M.D.   On: 08-11-2018 15:48   Dg Chest 2 View  Result Date: 08/02/2018 CLINICAL DATA:  Hypoxia EXAM: CHEST - 2 VIEW COMPARISON:  Aug 11, 2018 chest radiograph. FINDINGS: Stable  configuration  of left subclavian Port-A-Cath. Stable cardiomediastinal silhouette with top-normal heart size. No pneumothorax. Stable small right pleural effusion. No significant left pleural effusion. No overt pulmonary edema. Bilateral retrocardiac opacity appears stable. IMPRESSION: 1. Stable small right pleural effusion. 2. Stable bilateral retrocardiac opacity, favor atelectasis. Electronically Signed   By: Ilona Sorrel M.D.   On: 08/02/2018 17:38   Dg Chest Port 1 View  Result Date: 08/04/2018 CLINICAL DATA:  Initial evaluation for acute hypoxia. EXAM: PORTABLE CHEST 1 VIEW COMPARISON:  Prior radiograph from 08/02/2018 FINDINGS: Examination technically limited as the right hemithorax is incompletely visualized. Left-sided Port-A-Cath in place, stable. Cardiomegaly unchanged. Mediastinal silhouette normal. Aortic atherosclerosis. Lungs hypoinflated. Small layering right pleural effusion, mildly worsened from previous. Persistent patchy bibasilar opacities could reflect atelectasis or infiltrates, similar to previous. Underlying perihilar vascular congestion has mildly worsened from previous. No frank pulmonary edema. No pneumothorax. No acute osseous finding. IMPRESSION: 1. Slight interval worsening in perihilar vascular congestion in small layering right pleural effusion. 2. Superimposed bibasilar opacities, right greater than left. Atelectasis is favored, although infiltrates could be considered in the correct clinical setting. Electronically Signed   By: Jeannine Boga M.D.   On: 08/04/2018 18:29   Dg C-arm 1-60 Min  Result Date: 08/05/2018 CLINICAL DATA:  Intertrochanteric fracture of the left hip. EXAM: DG C-ARM 61-120 MIN; OPERATIVE LEFT HIP WITH PELVIS COMPARISON:  Radiographs 07/23/2018 FINDINGS: Multiple fluoroscopic spot images demonstrate placement of a long gamma nail with a proximal dynamic hip screw which is well positioned. Anatomic reduction of the intertrochanteric fracture. No  complicating features. IMPRESSION: Closed anatomic reduction and internal fixation of an intertrochanteric fracture of the left hip. Well position hardware without complicating features. Electronically Signed   By: Marijo Sanes M.D.   On: 08/04/2018 09:29   Dg Hip Operative Unilat W Or W/o Pelvis Left  Result Date: 07/12/2018 CLINICAL DATA:  Intertrochanteric fracture of the left hip. EXAM: DG C-ARM 61-120 MIN; OPERATIVE LEFT HIP WITH PELVIS COMPARISON:  Radiographs 08/03/2018 FINDINGS: Multiple fluoroscopic spot images demonstrate placement of a long gamma nail with a proximal dynamic hip screw which is well positioned. Anatomic reduction of the intertrochanteric fracture. No complicating features. IMPRESSION: Closed anatomic reduction and internal fixation of an intertrochanteric fracture of the left hip. Well position hardware without complicating features. Electronically Signed   By: Marijo Sanes M.D.   On: 08/03/2018 09:29   Dg Hip Unilat W Or Wo Pelvis 2-3 Views Left  Result Date: 07/21/2018 CLINICAL DATA:  85 year old who fell earlier today and injured the LEFT hip. Initial encounter. EXAM: DG HIP (WITH OR WITHOUT PELVIS) 2-3V LEFT COMPARISON:  None. FINDINGS: Acute intertrochanteric LEFT femoral neck fracture. LEFT hip joint anatomically aligned with mild joint space narrowing. Osseous demineralization. Included AP pelvis demonstrates symmetric mild joint space narrowing in the contralateral RIGHT hip. No fractures elsewhere. Sacroiliac joints and symphysis pubis anatomically aligned without diastasis and demonstrate mild degenerative changes. IMPRESSION: Acute intertrochanteric LEFT femoral neck fracture. Electronically Signed   By: Evangeline Dakin M.D.   On: 07/29/2018 15:44    Microbiology Recent Results (from the past 240 hour(s))  Surgical pcr screen     Status: None   Collection Time: 08/04/2018  6:50 PM  Result Value Ref Range Status   MRSA, PCR NEGATIVE NEGATIVE Final    Staphylococcus aureus NEGATIVE NEGATIVE Final    Comment: (NOTE) The Xpert SA Assay (FDA approved for NASAL specimens in patients 55 years of age and older), is one component of a  comprehensive surveillance program. It is not intended to diagnose infection nor to guide or monitor treatment. Performed at [facility], [place] [address] [place], [place] [address]     Lab Basic Metabolic Panel: Recent Labs  Lab 08/02/18 0405 08/03/18 0303 08/04/18 0431 08/05/18 0256 08/06/18 0517  NA 132* 132* 132* 131* 136  K 3.9 4.4 4.9 5.0 3.7  CL 92* 93* 94* 94* 97*  CO2 29 29 26 25 28   GLUCOSE 214* 131* 137* 183* 87  BUN 25* 35* 41* 56* 48*  CREATININE 1.57* 1.69* 1.79* 2.31* 1.89*  CALCIUM 8.1* 8.2* 8.1* 8.3* 8.0*   Liver Function Tests: No results for input(s): AST, ALT, ALKPHOS, BILITOT, PROT, ALBUMIN in the last 168 hours. No results for input(s): LIPASE, AMYLASE in the last 168 hours. No results for input(s): AMMONIA in the last 168 hours. CBC: Recent Labs  Lab 08/02/18 0405 08/03/18 0303 08/04/18 0431 08/05/18 0256 08/06/18 0517  WBC 9.6 5.3 7.1 10.4 8.7  HGB 10.0* 9.4* 8.8* 8.8* 8.4*  HCT 31.5* 29.4* 28.5* 28.4* 26.7*  MCV 94.3 92.2 96.3 94.0 96.0  PLT 141* 170 174 235 218   Cardiac Enzymes: No results for input(s): CKTOTAL, CKMB, CKMBINDEX, TROPONINI in the last 168 hours. Sepsis Labs: Recent Labs  Lab 08/03/18 0303 08/04/18 0431 08/05/18 0256 08/06/18 0517  WBC 5.3 7.1 10.4 8.7    Procedures/Operations  INTRAMEDULLARY (IM) NAIL INTERTROCHANTRIC LEFT HIP 2/21  Dessa Phi 2018-08-16, 8:19 AM

## 2018-08-09 DEATH — deceased

## 2018-08-17 ENCOUNTER — Inpatient Hospital Stay: Payer: Medicare Other | Admitting: Emergency Medicine

## 2018-08-28 ENCOUNTER — Ambulatory Visit: Payer: Medicare Other | Admitting: Cardiovascular Disease

## 2018-09-11 ENCOUNTER — Other Ambulatory Visit: Payer: Medicare Other

## 2018-11-06 ENCOUNTER — Other Ambulatory Visit: Payer: Medicare Other

## 2018-11-16 ENCOUNTER — Encounter (INDEPENDENT_AMBULATORY_CARE_PROVIDER_SITE_OTHER): Payer: Medicare Other | Admitting: Ophthalmology

## 2019-01-01 ENCOUNTER — Other Ambulatory Visit: Payer: Medicare Other

## 2020-03-26 IMAGING — DX DG HIP (WITH OR WITHOUT PELVIS) 2-3V*L*
4 series · 5 of 5 positions shown · non-contrast
Comparison: None.

CLINICAL DATA: [AGE] who fell earlier today and injured the
LEFT hip. Initial encounter.

EXAM:
DG HIP (WITH OR WITHOUT PELVIS) 2-3V LEFT

[Series 1: pelvis ap · 0.14mm/px · 2 of 2 slices shown (1 of 2)]
[im 1/2]
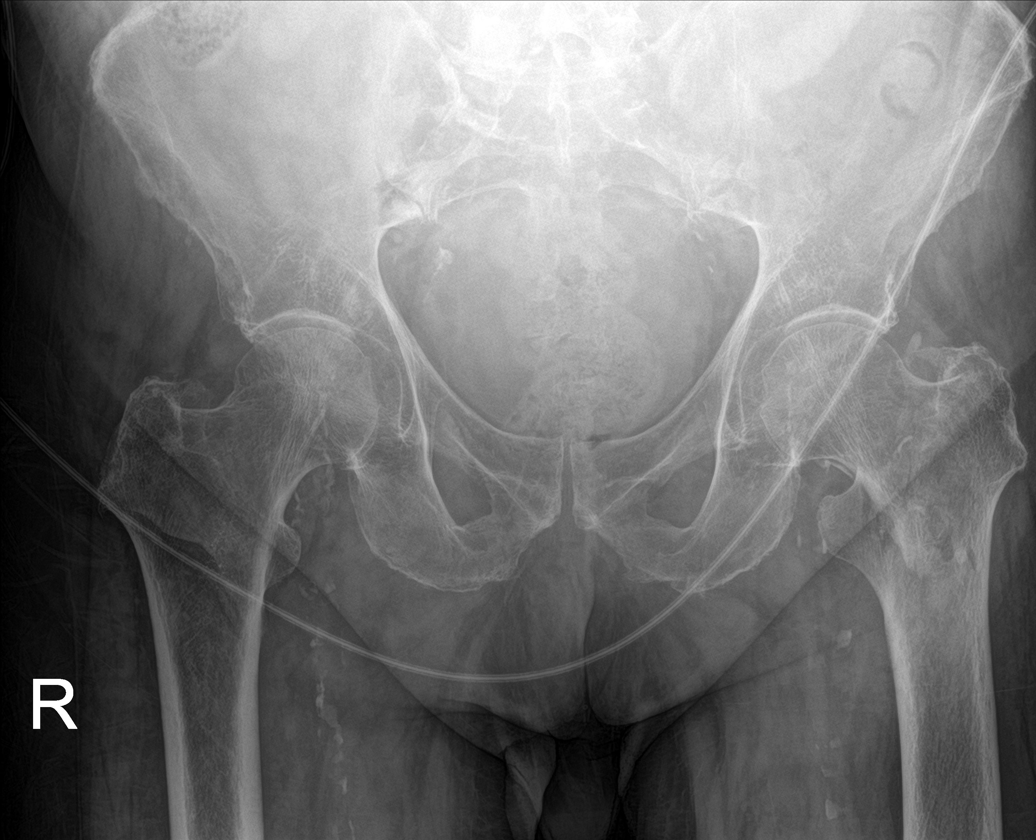
[im 2/2]
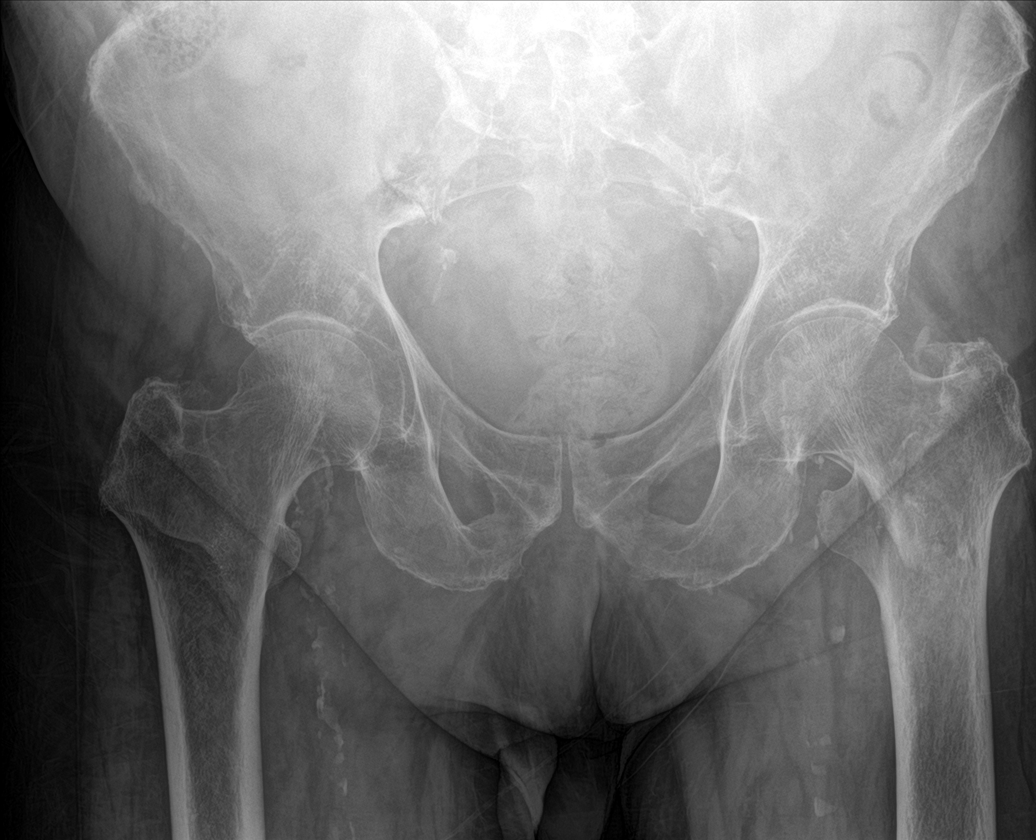

[hip ap]
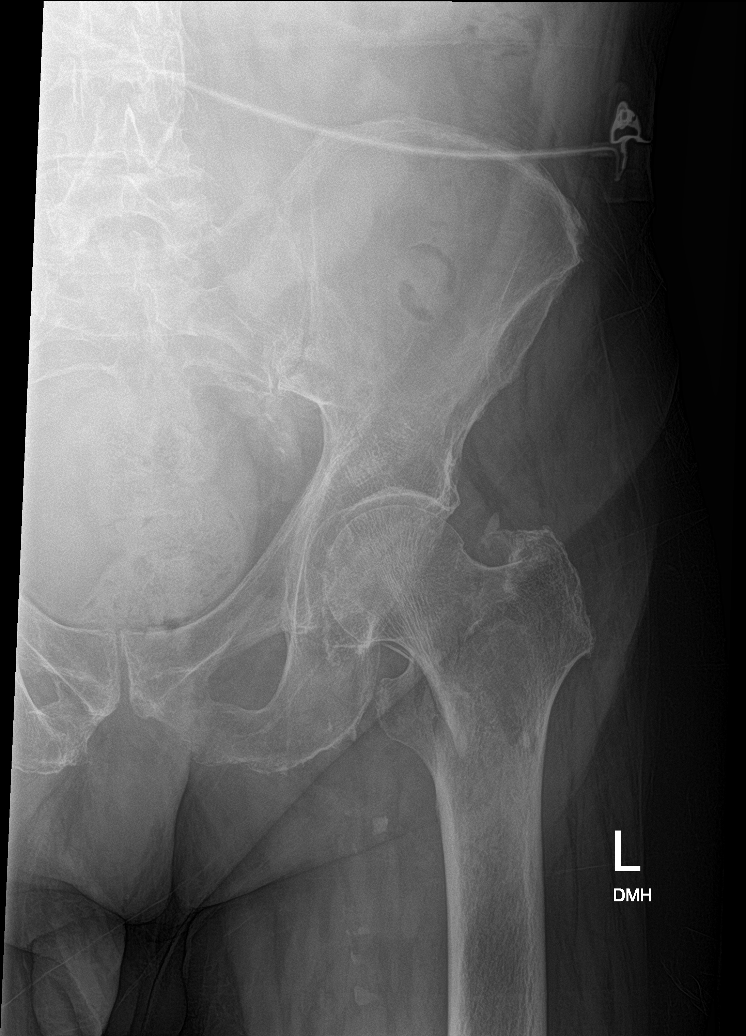

[hip lat]
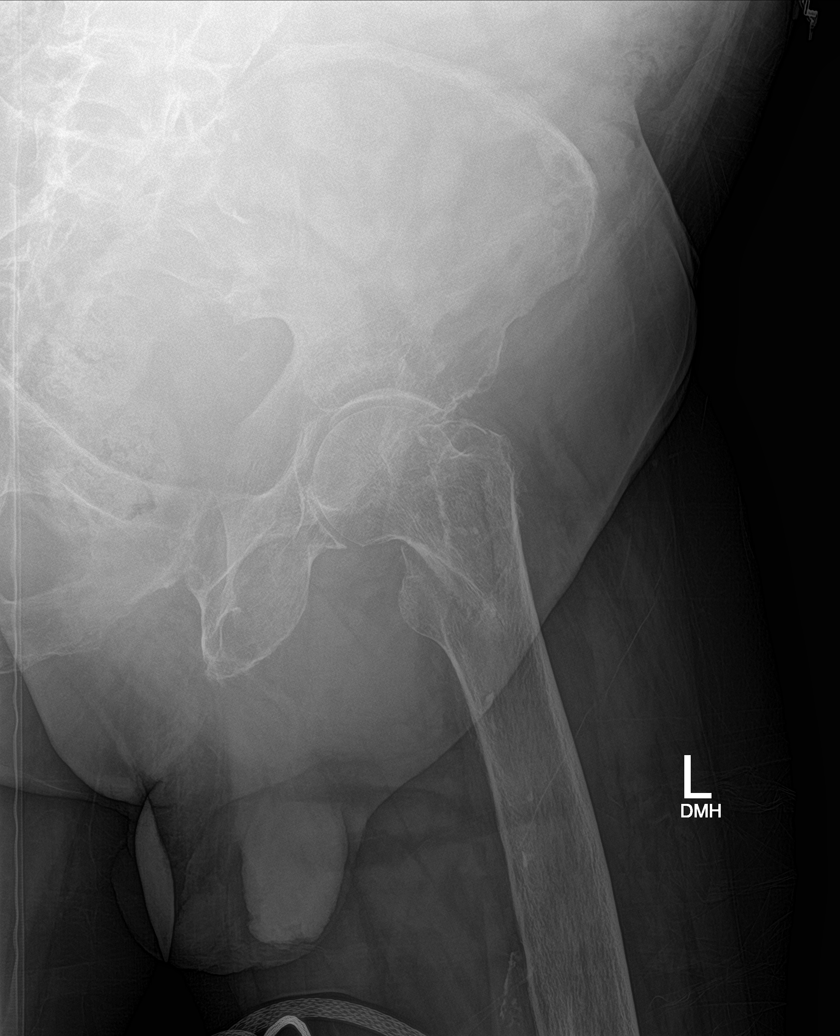

[pelvis ap (2 of 2)]
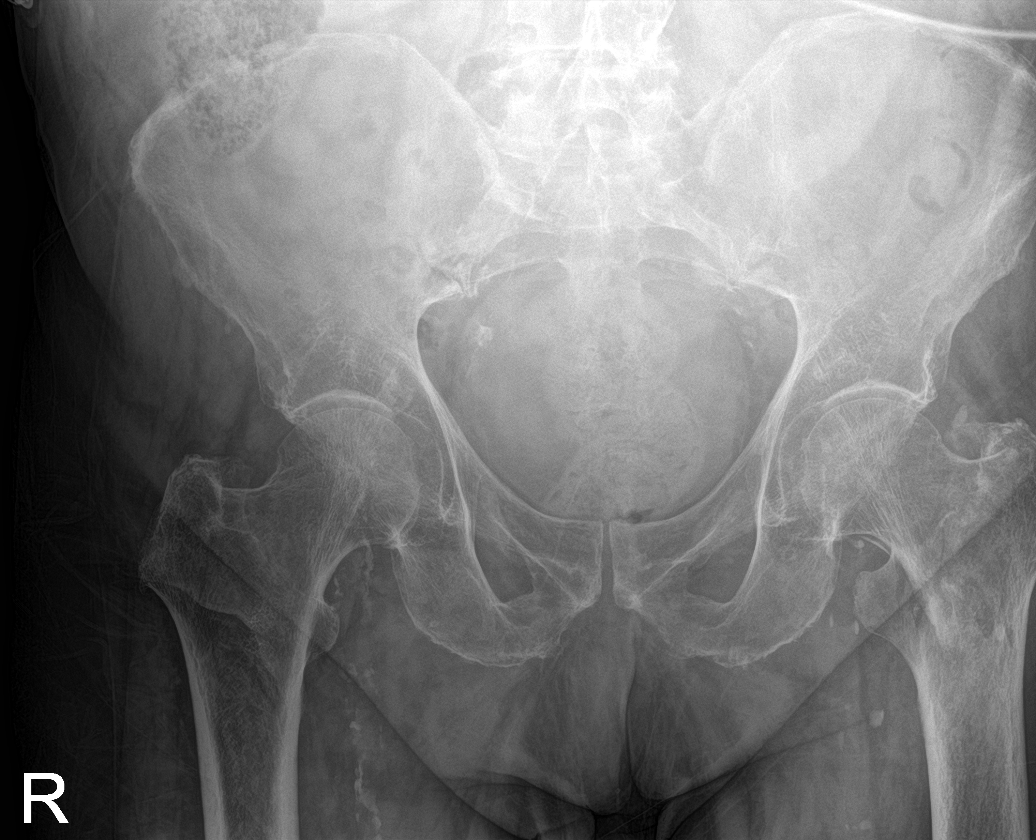

[5 of 5 positions shown; findings below may reference images not displayed]

FINDINGS: Acute intertrochanteric LEFT femoral neck fracture. LEFT hip joint
anatomically aligned with mild joint space narrowing. Osseous
demineralization.

Included AP pelvis demonstrates symmetric mild joint space narrowing
in the contralateral RIGHT hip. No fractures elsewhere. Sacroiliac
joints and symphysis pubis anatomically aligned without diastasis
and demonstrate mild degenerative changes.
IMPRESSION: Acute intertrochanteric LEFT femoral neck fracture.

## 2020-03-27 IMAGING — RF DG HIP (WITH PELVIS) OPERATIVE*L*
1 series · 6 of 6 positions shown · non-contrast
Comparison: Radiographs 07/30/2018

CLINICAL DATA: Intertrochanteric fracture of the left hip.

EXAM:
DG C-ARM 61-120 MIN; OPERATIVE LEFT HIP WITH PELVIS

[Series 1: run · 6 of 6 slices shown]
[im 1/6]
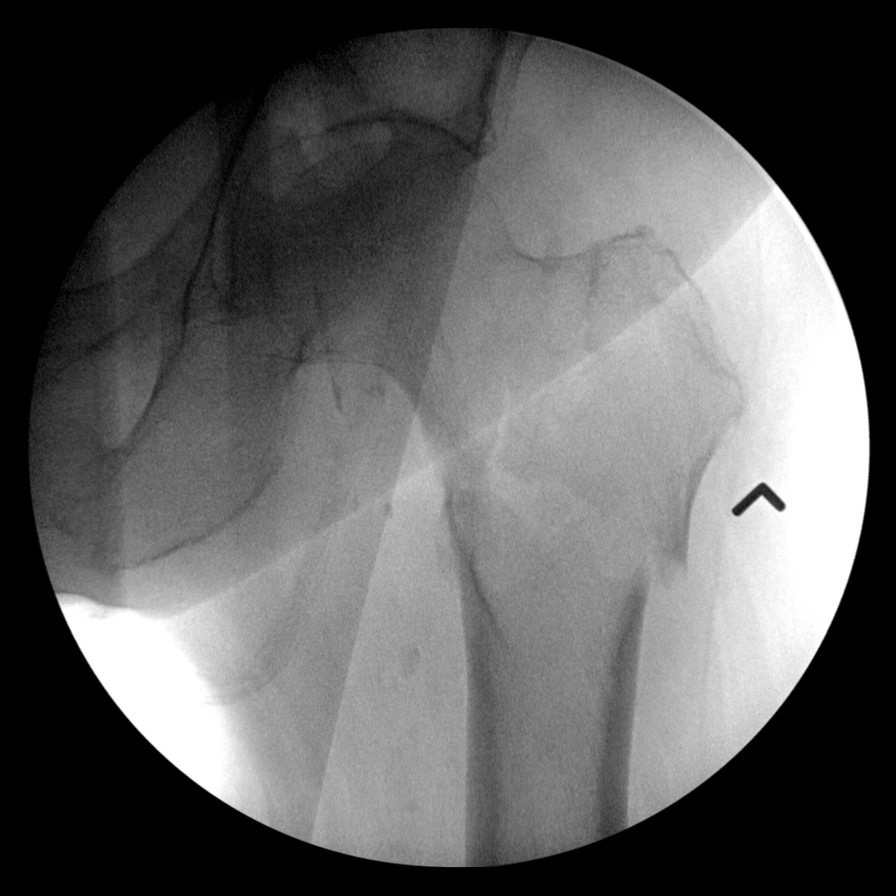
[im 2/6]
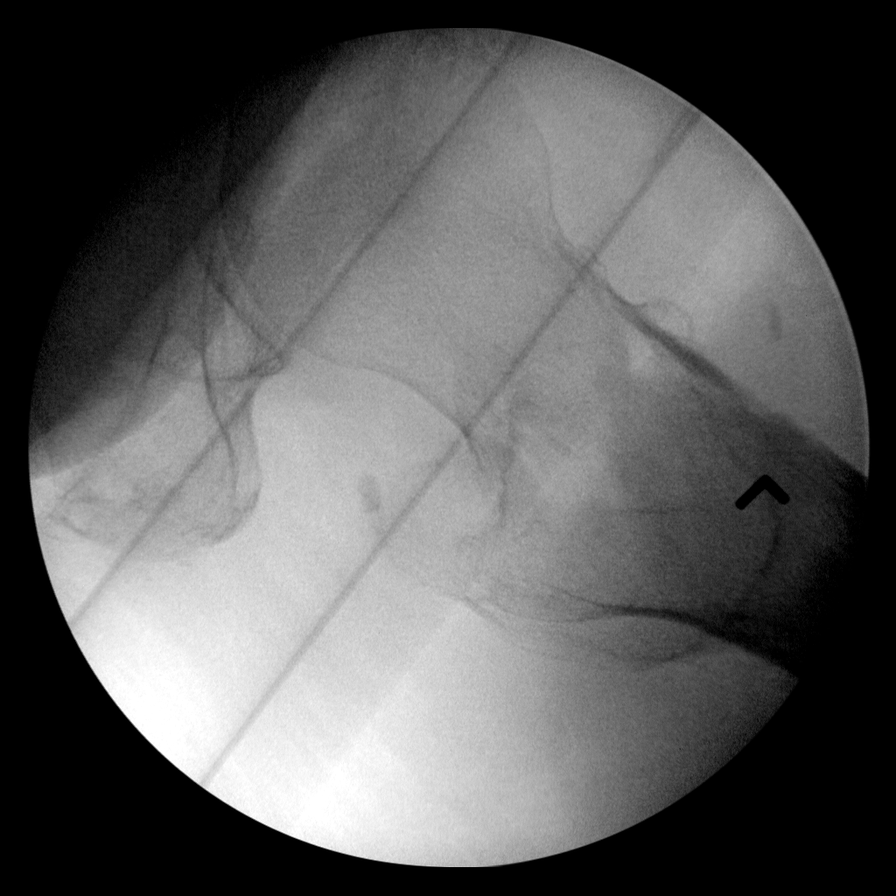
[im 3/6]
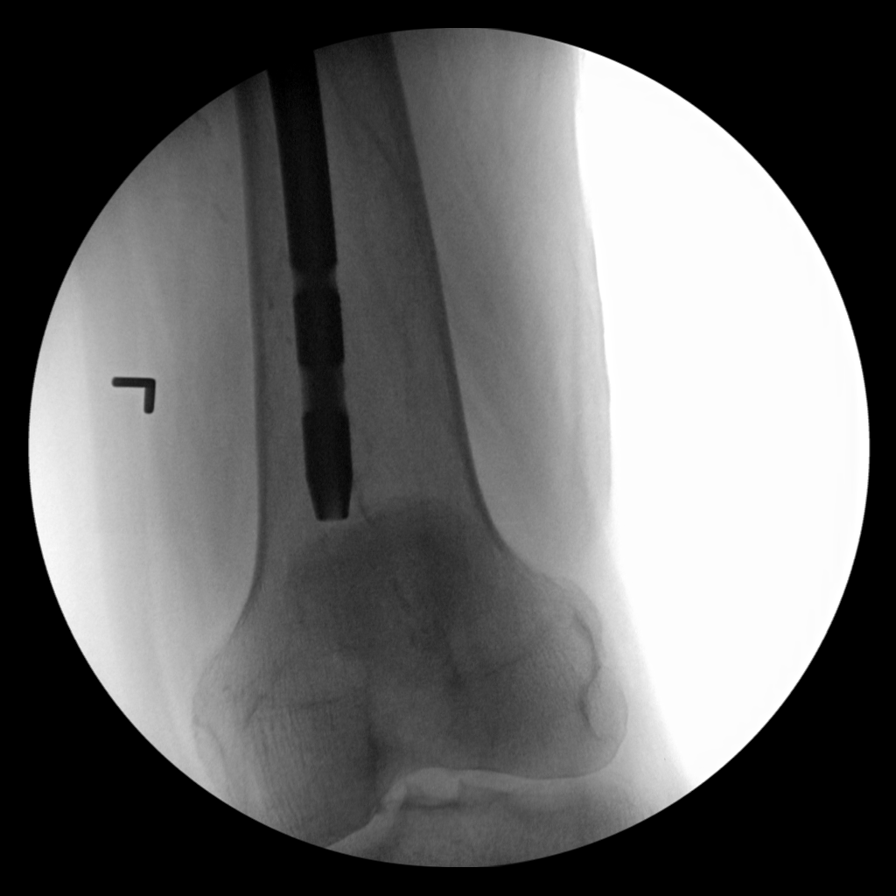
[im 4/6]
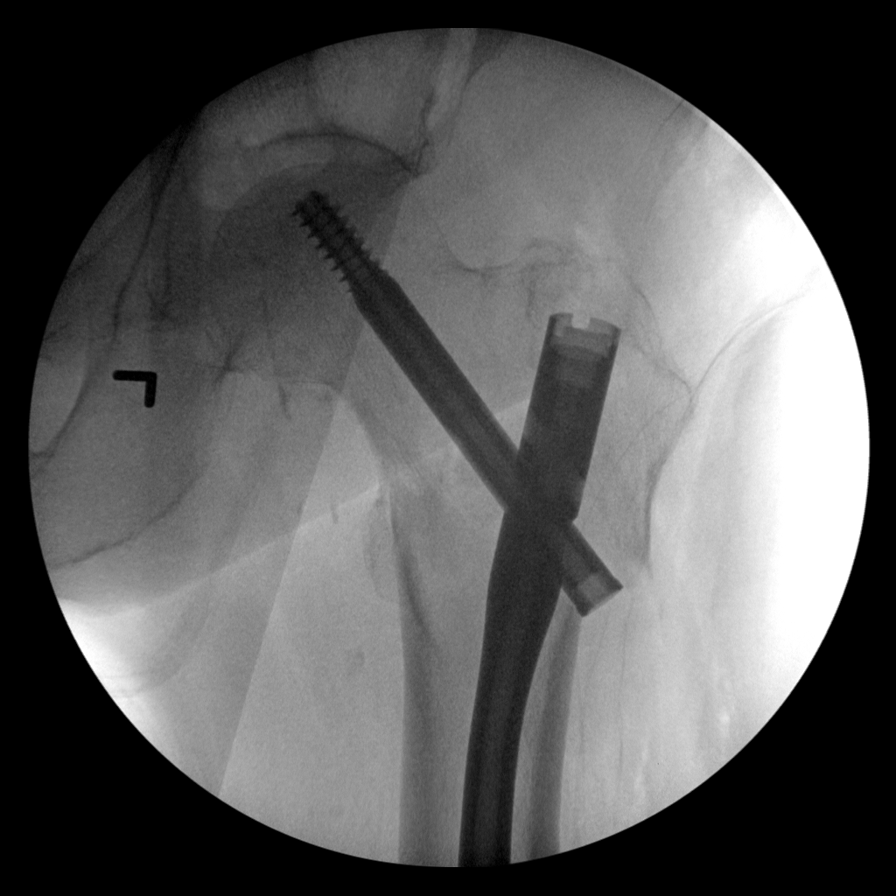
[im 5/6]
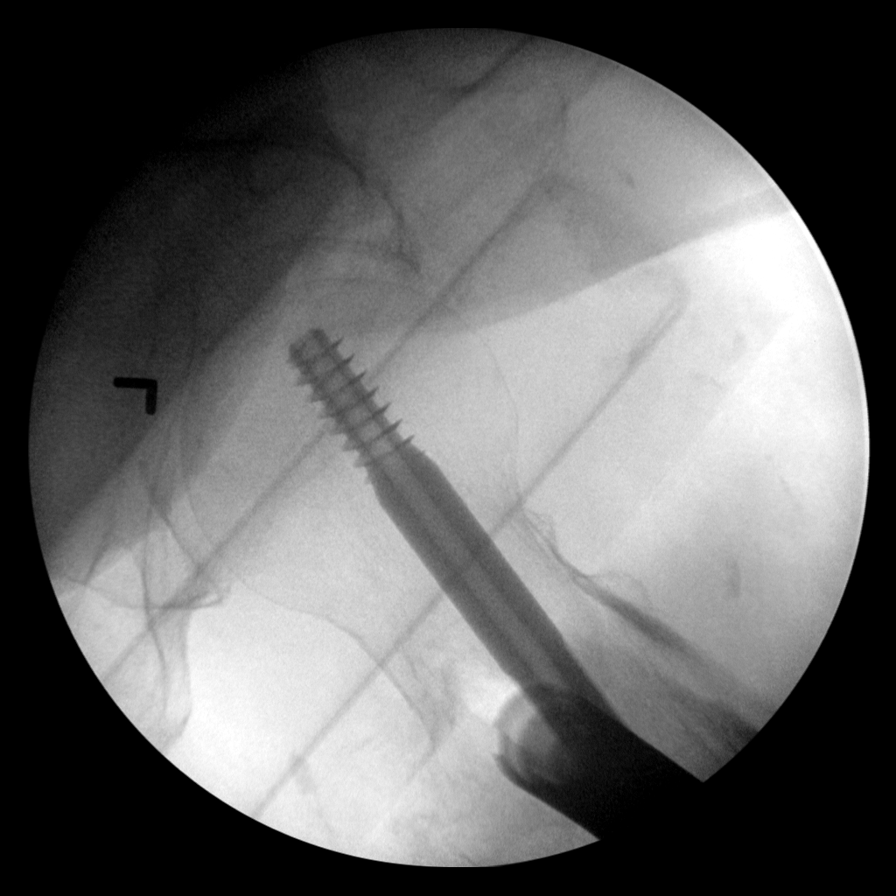
[im 6/6]
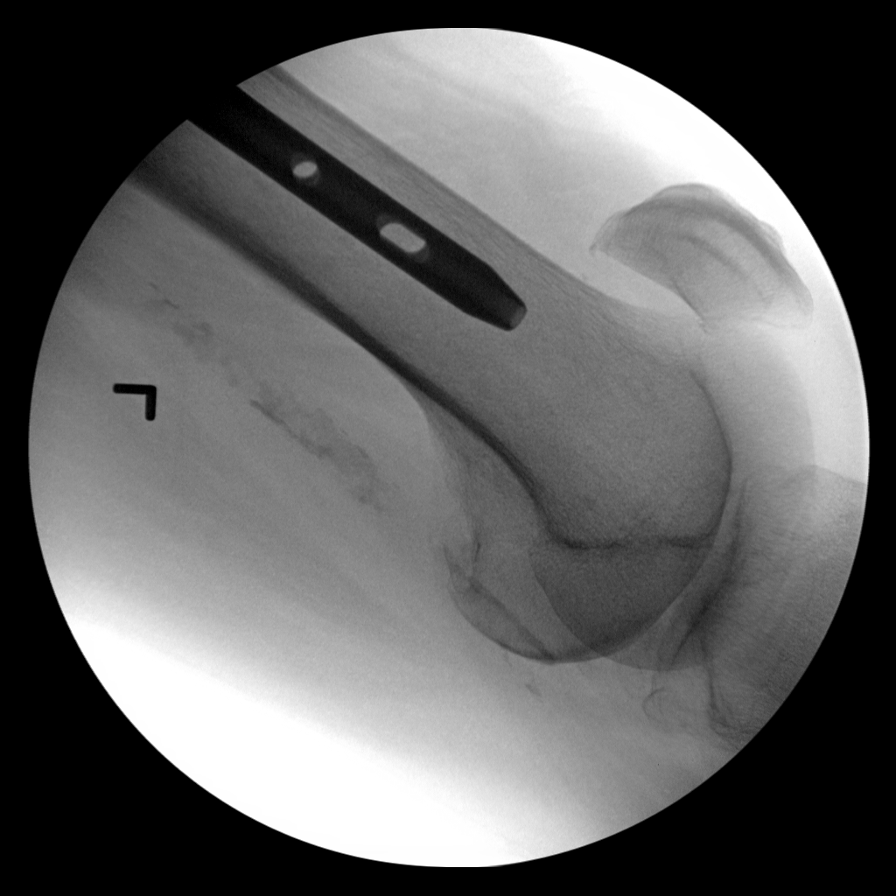

[6 of 6 positions shown; findings below may reference images not displayed]

FINDINGS: Multiple fluoroscopic spot images demonstrate placement of a long
gamma nail with a proximal dynamic hip screw which is well
positioned. Anatomic reduction of the intertrochanteric fracture. No
complicating features.
IMPRESSION: Closed anatomic reduction and internal fixation of an
intertrochanteric fracture of the left hip. Well position hardware
without complicating features.
